# Patient Record
Sex: Male | Born: 1960 | Race: White | Hispanic: No | Marital: Married | Smoking: Former smoker
Health system: Southern US, Community
[De-identification: ages and names within clinical notes are randomized; demographics above are authoritative.]

## PROBLEM LIST (undated history)

## (undated) ENCOUNTER — Ambulatory Visit: Admission: EM | Payer: BC Managed Care – PPO | Source: Home / Self Care

## (undated) DIAGNOSIS — J45909 Unspecified asthma, uncomplicated: Secondary | ICD-10-CM

## (undated) DIAGNOSIS — E785 Hyperlipidemia, unspecified: Secondary | ICD-10-CM

## (undated) DIAGNOSIS — T7840XA Allergy, unspecified, initial encounter: Secondary | ICD-10-CM

## (undated) DIAGNOSIS — K635 Polyp of colon: Secondary | ICD-10-CM

## (undated) DIAGNOSIS — K219 Gastro-esophageal reflux disease without esophagitis: Secondary | ICD-10-CM

## (undated) HISTORY — DX: Unspecified asthma, uncomplicated: J45.909

## (undated) HISTORY — PX: WRIST ARTHROPLASTY: SHX1088

## (undated) HISTORY — DX: Hyperlipidemia, unspecified: E78.5

## (undated) HISTORY — DX: Allergy, unspecified, initial encounter: T78.40XA

## (undated) HISTORY — PX: CLAVICLE EXCISION: SHX597

## (undated) HISTORY — DX: Polyp of colon: K63.5

## (undated) HISTORY — PX: HERNIA REPAIR: SHX51

## (undated) HISTORY — DX: Gastro-esophageal reflux disease without esophagitis: K21.9

---

## 2011-05-21 LAB — HM COLONOSCOPY

## 2014-07-26 ENCOUNTER — Emergency Department: Payer: Self-pay | Admitting: Emergency Medicine

## 2014-07-26 LAB — BASIC METABOLIC PANEL
Anion Gap: 7 (ref 7–16)
BUN: 13 mg/dL (ref 7–18)
CO2: 26 mmol/L (ref 21–32)
Calcium, Total: 9.4 mg/dL (ref 8.5–10.1)
Chloride: 105 mmol/L (ref 98–107)
Creatinine: 1.14 mg/dL (ref 0.60–1.30)
EGFR (African American): >60
EGFR (Non-African Amer.): >60
Glucose: 111 mg/dL — ABNORMAL HIGH (ref 65–99)
Osmolality: 276 (ref 275–301)
Potassium: 4.1 mmol/L (ref 3.5–5.1)
Sodium: 138 mmol/L (ref 136–145)

## 2014-07-26 LAB — CBC
HCT: 46.8 % (ref 40.0–52.0)
HGB: 15.7 g/dL (ref 13.0–18.0)
MCH: 27.8 pg (ref 26.0–34.0)
MCHC: 33.5 g/dL (ref 32.0–36.0)
MCV: 83 fL (ref 80–100)
Platelet: 302 10*3/uL (ref 150–440)
RBC: 5.64 10*6/uL (ref 4.40–5.90)
RDW: 14 % (ref 11.5–14.5)
WBC: 9.5 10*3/uL (ref 3.8–10.6)

## 2014-07-26 LAB — TROPONIN I

## 2016-02-13 ENCOUNTER — Ambulatory Visit (INDEPENDENT_AMBULATORY_CARE_PROVIDER_SITE_OTHER): Payer: BLUE CROSS/BLUE SHIELD | Admitting: Family Medicine

## 2016-02-13 ENCOUNTER — Encounter: Payer: Self-pay | Admitting: Family Medicine

## 2016-02-13 VITALS — BP 144/82 | HR 89 | Temp 98.0°F | Resp 16 | Ht 68.0 in | Wt 222.6 lb

## 2016-02-13 DIAGNOSIS — E785 Hyperlipidemia, unspecified: Secondary | ICD-10-CM | POA: Diagnosis not present

## 2016-02-13 DIAGNOSIS — I10 Essential (primary) hypertension: Secondary | ICD-10-CM | POA: Diagnosis not present

## 2016-02-13 DIAGNOSIS — Z125 Encounter for screening for malignant neoplasm of prostate: Secondary | ICD-10-CM | POA: Diagnosis not present

## 2016-02-13 DIAGNOSIS — J4540 Moderate persistent asthma, uncomplicated: Secondary | ICD-10-CM | POA: Insufficient documentation

## 2016-02-13 DIAGNOSIS — J4530 Mild persistent asthma, uncomplicated: Secondary | ICD-10-CM | POA: Diagnosis not present

## 2016-02-13 NOTE — Assessment & Plan Note (Signed)
Continue statin once daily. Check fasting lipid panel.

## 2016-02-13 NOTE — Assessment & Plan Note (Signed)
Appear controlled. Will need spirometry next visit. Check theophylline levels. Reviewed alarm symptoms.

## 2016-02-13 NOTE — Patient Instructions (Addendum)
[email or web address]  Your goal blood pressure is 140/90. Check 2-3 times per week.  Work on low salt/sodium diet - goal <1.5gm (1,500mg ) per day. Eat a diet high in fruits/vegetables and whole grains.  Look into mediterranean and DASH diet. Goal activity is 168min/wk of moderate intensity exercise.  This can be split into 30 minute chunks.  If you are not at this level, you can start with smaller 10-15 min increments and slowly build up activity. Look at [place].org for more resources   Learning About Sleeping Well What does sleeping well mean? Sleeping well means getting enough sleep. How much sleep is enough varies among people. The number of hours you sleep is not as important as how you feel when you wake up. If you do not feel refreshed, you probably need more sleep. Another sign of not getting enough sleep is feeling tired during the day. The average total nightly sleep time is 7 to 8 hours. Healthy adults may need a little more or a little less than this. Why is getting enough sleep important? Getting enough quality sleep is a basic part of good health. When your sleep suffers, your mood and your thoughts can suffer too. You may find yourself feeling more grumpy or stressed. Not getting enough sleep also can lead to serious problems, including injury, accidents, anxiety, and depression. What might cause poor sleeping? Many things can cause sleep problems, including: 1. Stress. Stress can be caused by fear about a single event, such as giving a speech. Or you may have ongoing stress, such as worry about work or school. 2. Depression, anxiety, and other mental or emotional conditions. 3. Changes in your sleep habits or surroundings. This includes changes that happen where you sleep, such as noise, light, or sleeping in a different bed. It also includes changes in your sleep pattern, such as having jet lag or working a late shift. 4. Health problems, such as pain, breathing  problems, and restless legs syndrome. 5. Lack of regular exercise. How can you help yourself? Here are some tips that may help you sleep more soundly and wake up feeling more refreshed. Your sleeping area  1. Use your bedroom only for sleeping and sex. A bit of light reading may help you fall asleep. But if it doesn't, do your reading elsewhere in the house. Don't watch TV in bed. 2. Be sure your bed is big enough to stretch out comfortably, especially if you have a sleep partner. 3. Keep your bedroom quiet, dark, and cool. Use curtains, blinds, or a sleep mask to block out light. To block out noise, use earplugs, soothing music, or a "white noise" machine. Your evening and bedtime routine  1. Create a relaxing bedtime routine. You might want to take a warm shower or bath, listen to soothing music, or drink a cup of noncaffeinated tea. 2. Go to bed at the same time every night. And get up at the same time every morning, even if you feel tired. What to avoid  1. Limit caffeine (coffee, tea, caffeinated sodas) during the day, and don't have any for at least 4 to 6 hours before bedtime. 2. Don't drink alcohol before bedtime. Alcohol can cause you to wake up more often during the night. 3. Don't smoke or use tobacco, especially in the evening. Nicotine can keep you awake. 4. Don't take naps during the day, especially close to bedtime. 5. Don't lie in bed awake for too long. If you can't fall asleep, or if you  wake up in the middle of the night and can't get back to sleep within 15 minutes or so, get out of bed and go to another room until you feel sleepy. 6. Don't take medicine right before bed that may keep you awake or make you feel hyper or energized. Your doctor can tell you if your medicine may do this and if you can take it earlier in the day. If you can't sleep   Imagine yourself in a peaceful, pleasant scene. Focus on the details and feelings of being in a place that is relaxing.  Get up  and do a quiet or boring activity until you feel sleepy.  Don't drink any liquids after 6 p.m. if you wake up often because you have to go to the bathroom.

## 2016-02-13 NOTE — Assessment & Plan Note (Signed)
Elevated today. Pt will check at home 2-3 x per week x 1 mos. Check CMET. Will discuss medication at next visit.

## 2016-02-13 NOTE — Progress Notes (Signed)
Subjective:    Patient ID: Derek Cook, male    DOB: August 24, 1961, 55 y.o.   MRN: [id number]  HPI: Derek Cook is a 55 y.o. male presenting on 02/13/2016 for Establish Care   HPI  Pt presents to establish care today. [facility]  It has been 4 months since His last PCP visit. Records from previous provider will be requested and reviewed. Current medical problems include:  Hyperlipidemia: Was previously on medication but had muscle cramps. Is on Livalo- new statin- 3 mos. Tolerating well.  Hypertension: Diagnosed. Goes up and down. Never taking medication. No chest pain. No shortness of breath. No dizziness.  Asthma: Take theophyilline- started taking late 80's, Proventil for rescue, advair daily. Dx age: 36.2 years old.  Wife started smoking a E-cigarettes in house- agitations his asthma. Take theophylline in the AM. Uses rescue inhaler PRN- about 5 times per week. Has not spirometry.    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  Drinks water. Watching snacks- mows yard. Walkiing Colonoscopy: Had one age 30. Polyps- benign. 5 year return? PSA: No family of prostate.  Tdap: Within 10 years.   Past Medical History  Diagnosis Date  . Hyperlipidemia   . Asthma   . Allergy   . GERD (gastroesophageal reflux disease)   . Colon polyp    Social History   Social History  . Marital Status: Married    Spouse Name: N/A  . Number of Children: N/A  . Years of Education: N/A   Occupational History  . Not on file.   Social History Main Topics  . Smoking status: Former Smoker    Quit date: 09/07/2009  . Smokeless tobacco: Not on file  . Alcohol Use: No  . Drug Use: No  . Sexual Activity: Not on file   Other Topics Concern  . Not on file   Social History Narrative  . No narrative on file   Family History  Problem Relation Age of Onset  . Cancer Mother     malonema  . Diabetes Mother   . Hyperlipidemia Mother   .  Dementia Mother   . Cancer Father   . Cancer Sister   . Heart disease Sister   . Diabetes Brother   . Hyperlipidemia Brother   . Dementia Paternal Grandmother    No current outpatient prescriptions on file prior to visit.   No current facility-administered medications on file prior to visit.    Review of Systems  Constitutional: Negative for fever and chills.  HENT: Negative.   Respiratory: Negative for chest tightness, shortness of breath and wheezing.   Cardiovascular: Negative for chest pain, palpitations and leg swelling.  Gastrointestinal: Negative for nausea, vomiting and abdominal pain.  Endocrine: Negative.   Genitourinary: Negative for dysuria, urgency, discharge, penile pain and testicular pain.  Musculoskeletal: Negative for back pain, joint swelling and arthralgias.  Skin: Negative.   Neurological: Negative for dizziness, weakness, numbness and headaches.  Psychiatric/Behavioral: Negative for sleep disturbance and dysphoric mood.   Per HPI unless specifically indicated above     Objective:    BP 144/82 mmHg  Pulse 89  Temp(Src) 98 F (36.7 C) (Oral)  Resp 16  Ht _0  (1.727 m)  Wt 222 lb 9.6 oz (100.971 kg)  BMI 33.85 kg/m2  Wt Readings from Last 3 Encounters:  02/13/16 222 lb 9.6 oz (100.971 kg)    Physical Exam  Constitutional: He is oriented to  person, place, and time. He appears well-developed and well-nourished. No distress.  HENT:  Head: Normocephalic and atraumatic.  Neck: Neck supple. No thyromegaly present.  Cardiovascular: Normal rate, regular rhythm and normal heart sounds.  Exam reveals no gallop and no friction rub.   No murmur heard. Pulmonary/Chest: Effort normal and breath sounds normal. He has no wheezes.  Abdominal: Soft. Bowel sounds are normal. He exhibits no distension. There is no tenderness. There is no rebound.  Musculoskeletal: Normal range of motion. He exhibits no edema or tenderness.  Neurological: He is alert and oriented  to person, place, and time. He has normal reflexes.  Skin: Skin is warm and dry. No rash noted. No erythema.  Psychiatric: He has a normal mood and affect. His behavior is normal. Thought content normal.   Results for orders placed or performed in visit on 07/26/14  Troponin I  Result Value Ref Range   Troponin-I < 0.02 ng/mL  CBC  Result Value Ref Range   WBC 9.5 3.8-10.6 x10 3/mm 3   RBC 5.64 4.40-5.90 x10 6/mm 3   HGB 15.7 13.0-18.0 g/dL   HCT 46.8 40.0-52.0 %   MCV 83 80-100 fL   MCH 27.8 26.0-34.0 pg   MCHC 33.5 32.0-36.0 g/dL   RDW 14.0 11.5-14.5 %   Platelet 302 150-440 x10 3/mm 3  Basic metabolic panel  Result Value Ref Range   Glucose 111 (H) 65-99 mg/dL   BUN 13 7-18 mg/dL   Creatinine 1.14 0.60-1.30 mg/dL   Sodium 138 136-145 mmol/L   Potassium 4.1 3.5-5.1 mmol/L   Chloride 105 98-107 mmol/L   Co2 26 21-32 mmol/L   Calcium, Total 9.4 8.5-10.1 mg/dL   Osmolality 276 275-301   Anion Gap 7 7-16   EGFR (African American) >60 >53m/min   EGFR (Non-African Amer.) >60 >625mmin      Assessment & Plan:   Problem List Items Addressed This Visit      Cardiovascular and Mediastinum   Hypertension    Elevated today. Pt will check at home 2-3 x per week x 1 mos. Check CMET. Will discuss medication at next visit.       Relevant Medications   aspirin EC 81 MG tablet   LIVALO 2 MG TABS     Respiratory   Mild persistent asthma - Primary    Appear controlled. Will need spirometry next visit. Check theophylline levels. Reviewed alarm symptoms.       Relevant Medications   theophylline (THEODUR) 300 MG 12 hr tablet   PROVENTIL HFA 108 (90 Base) MCG/ACT inhaler   ADVAIR DISKUS 250-50 MCG/DOSE AEPB   Other Relevant Orders   COMPLETE METABOLIC PANEL WITH GFR   Theophylline level     Other   Hyperlipidemia    Continue statin once daily. Check fasting lipid panel.       Relevant Medications   aspirin EC 81 MG tablet   LIVALO 2 MG TABS   Other Relevant Orders    Lipid Profile    Other Visit Diagnoses    Screening for prostate cancer        Pt would like screening PSA.     Relevant Orders    PSA       Meds ordered this encounter  Medications  . aspirin EC 81 MG tablet    Sig: Take 81 mg by mouth.  . theophylline (THEODUR) 300 MG 12 hr tablet    Sig:     Refill:  2  . omeprazole (PRILOSEC)  40 MG capsule    Sig:     Refill:  3  . LIVALO 2 MG TABS    Sig:     Refill:  10  . PROVENTIL HFA 108 (90 Base) MCG/ACT inhaler    Sig:     Refill:  1  . ADVAIR DISKUS 250-50 MCG/DOSE AEPB    Sig:     Refill:  8  . Melatonin 3 MG TABS    Sig: Take 10 mg by mouth.  . loratadine-pseudoephedrine (CLARITIN-D 24-HOUR) 10-240 MG 24 hr tablet    Sig: Take 1 tablet by mouth daily.      Follow up plan: Return in about 4 weeks (around 03/12/2016) for BP check. Needs appt for lab.Marland Kitchen

## 2016-02-25 ENCOUNTER — Ambulatory Visit (INDEPENDENT_AMBULATORY_CARE_PROVIDER_SITE_OTHER): Payer: BLUE CROSS/BLUE SHIELD | Admitting: Family Medicine

## 2016-02-25 VITALS — BP 143/87 | HR 74 | Temp 98.5°F | Resp 16 | Ht 68.0 in | Wt 222.6 lb

## 2016-02-25 DIAGNOSIS — I10 Essential (primary) hypertension: Secondary | ICD-10-CM | POA: Diagnosis not present

## 2016-02-25 DIAGNOSIS — N529 Male erectile dysfunction, unspecified: Secondary | ICD-10-CM

## 2016-02-25 MED ORDER — AMLODIPINE BESYLATE 5 MG PO TABS: 5.0000 mg | ORAL_TABLET | Freq: Every day | ORAL | Status: DC

## 2016-02-25 NOTE — Assessment & Plan Note (Signed)
Start amlodipine once daily to help control blood pressure. Reviewed DASH diet. Check CMET- pt has appt for lab work.  Recheck 4 weeks.

## 2016-02-25 NOTE — Progress Notes (Signed)
Subjective:    Patient ID: Derek Cook, male    DOB: [birth year], 55 y.o.   MRN: [id number]  HPI: Derek Cook is a 55 y.o. male presenting on 02/25/2016 for Hypertension   HPI  Pt presents for blood pressure recheck. BP's at home 150/80-90's. High 166/124- had a HA at the time. No chest pain or shortness of breath.  Has stopped taking his claritin-D. It caused his blood pressure to elevate.  Has concerns about erectile dysfunction. Noticed decrease in ability to maintain erection over past few years. Desire is the same. Recent stressors due to wife's health and weight gain he thinks are contributing. Was given viagra samples by his previous doctor but never tried them.   Past Medical History  Diagnosis Date  . Hyperlipidemia   . Asthma   . Allergy   . GERD (gastroesophageal reflux disease)   . Colon polyp     Current Outpatient Prescriptions on File Prior to Visit  Medication Sig  . ADVAIR DISKUS 250-50 MCG/DOSE AEPB   . aspirin EC 81 MG tablet Take 81 mg by mouth.  Marland Kitchen LIVALO 2 MG TABS   . loratadine-pseudoephedrine (CLARITIN-D 24-HOUR) 10-240 MG 24 hr tablet Take 1 tablet by mouth daily.  . Melatonin 3 MG TABS Take 10 mg by mouth.  Marland Kitchen omeprazole (PRILOSEC) 40 MG capsule   . PROVENTIL HFA 108 (90 Base) MCG/ACT inhaler   . theophylline (THEODUR) 300 MG 12 hr tablet    No current facility-administered medications on file prior to visit.    Review of Systems  Constitutional: Negative for fever and chills.  HENT: Negative.   Respiratory: Negative for chest tightness, shortness of breath and wheezing.   Cardiovascular: Negative for chest pain, palpitations and leg swelling.  Gastrointestinal: Negative for nausea, vomiting and abdominal pain.  Endocrine: Negative.   Genitourinary: Negative for dysuria, urgency, discharge, penile pain and testicular pain.       Erectile dysfunction.  Musculoskeletal: Negative for back pain, joint swelling and arthralgias.  Skin: Negative.     Neurological: Negative for dizziness, weakness, numbness and headaches.  Psychiatric/Behavioral: Negative for sleep disturbance and dysphoric mood.   Per HPI unless specifically indicated above     Objective:    BP 143/87 mmHg  Pulse 74  Temp(Src) 98.5 F (36.9 C) (Oral)  Resp 16  Ht 5' 8"  (1.727 m)  Wt 222 lb 9.6 oz (100.971 kg)  BMI 33.85 kg/m2  Wt Readings from Last 3 Encounters:  02/25/16 222 lb 9.6 oz (100.971 kg)  02/13/16 222 lb 9.6 oz (100.971 kg)    Physical Exam  Constitutional: He is oriented to person, place, and time. He appears well-developed and well-nourished. No distress.  HENT:  Head: Normocephalic and atraumatic.  Neck: Neck supple. No thyromegaly present.  Cardiovascular: Normal rate, regular rhythm and normal heart sounds.  Exam reveals no gallop and no friction rub.   No murmur heard. Pulmonary/Chest: Effort normal and breath sounds normal. He has no wheezes.  Abdominal: Soft. Bowel sounds are normal. He exhibits no distension. There is no tenderness. There is no rebound.  Musculoskeletal: Normal range of motion. He exhibits no edema or tenderness.  Neurological: He is alert and oriented to person, place, and time. He has normal reflexes.  Skin: Skin is warm and dry. No rash noted. No erythema.  Psychiatric: He has a normal mood and affect. His behavior is normal. Thought content normal.   Results for orders placed or performed in visit on  07/26/14  Troponin I  Result Value Ref Range   Troponin-I < 0.02 ng/mL  CBC  Result Value Ref Range   WBC 9.5 3.8-10.6 x10 3/mm 3   RBC 5.64 4.40-5.90 x10 6/mm 3   HGB 15.7 13.0-18.0 g/dL   HCT 46.8 40.0-52.0 %   MCV 83 80-100 fL   MCH 27.8 26.0-34.0 pg   MCHC 33.5 32.0-36.0 g/dL   RDW 14.0 11.5-14.5 %   Platelet 302 150-440 x10 3/mm 3  Basic metabolic panel  Result Value Ref Range   Glucose 111 (H) 65-99 mg/dL   BUN 13 7-18 mg/dL   Creatinine 1.14 0.60-1.30 mg/dL   Sodium 138 136-145 mmol/L   Potassium  4.1 3.5-5.1 mmol/L   Chloride 105 98-107 mmol/L   Co2 26 21-32 mmol/L   Calcium, Total 9.4 8.5-10.1 mg/dL   Osmolality 276 275-301   Anion Gap 7 7-16   EGFR (African American) >60 >66m/min   EGFR (Non-African Amer.) >60 >657mmin      Assessment & Plan:   Problem List Items Addressed This Visit      Cardiovascular and Mediastinum   Hypertension - Primary    Start amlodipine once daily to help control blood pressure. Reviewed DASH diet. Check CMET- pt has appt for lab work.  Recheck 4 weeks.       Relevant Medications   amLODipine (NORVASC) 5 MG tablet    Other Visit Diagnoses    Erectile dysfunction, unspecified erectile dysfunction type        Likely 2/2 weight gain or recent stressors. Discussed non-pharmacological managment. Consider medication if symptoms do not improve.        Meds ordered this encounter  Medications  . amLODipine (NORVASC) 5 MG tablet    Sig: Take 1 tablet (5 mg total) by mouth daily.    Dispense:  30 tablet    Refill:  11    Order Specific Question:  Supervising Provider    Answer:  HAArlis [phone]    Follow up plan: Return in about 1 month (around 03/26/2016) for BP check. .Marland Kitchen

## 2016-02-25 NOTE — Patient Instructions (Addendum)
Your goal blood pressure is 140/90 Work on low salt/sodium diet - goal <1.5gm (1,500mg ) per day. Eat a diet high in fruits/vegetables and whole grains.  Look into mediterranean and DASH diet. Goal activity is 178min/wk of moderate intensity exercise.  This can be split into 30 minute chunks.  If you are not at this level, you can start with smaller 10-15 min increments and slowly build up activity. Look at [place].org for more resources   Erectile Dysfunction Erectile dysfunction is the inability to get or sustain a good enough erection to have sexual intercourse. Erectile dysfunction may involve:  Inability to get an erection.  Lack of enough hardness to allow penetration.  Loss of the erection before sex is finished.  Premature ejaculation. CAUSES  Certain drugs, such as:  Pain relievers.  Antihistamines.  Antidepressants.  Blood pressure medicines.  Water pills (diuretics).  Ulcer medicines.  Muscle relaxants.  Illegal drugs.  Excessive drinking.  Psychological causes, such as:  Anxiety.  Depression.  Sadness.  Exhaustion.  Performance fear.  Stress.  Physical causes, such as:  Artery problems. This may include diabetes, smoking, liver disease, or atherosclerosis.  High blood pressure.  Hormonal problems, such as low testosterone.  Obesity.  Nerve problems. This may include back or pelvic injuries, diabetes mellitus, multiple sclerosis, or Parkinson disease. SYMPTOMS  Inability to get an erection.  Lack of enough hardness to allow penetration.  Loss of the erection before sex is finished.  Premature ejaculation.  Normal erections at some times, but with frequent unsatisfactory episodes.  Orgasms that are not satisfactory in sensation or frequency.  Low sexual satisfaction in either partner because of erection problems.  A curved penis occurring with erection. The curve may cause pain or may be too curved to allow for  intercourse.  Never having nighttime erections. DIAGNOSIS Your caregiver can often diagnose this condition by:  Performing a physical exam to find other diseases or specific problems with the penis.  Asking you detailed questions about the problem.  Performing blood tests to check for diabetes mellitus or to measure hormone levels.  Performing urine tests to find other underlying health conditions.  Performing an ultrasound exam to check for scarring.  Performing a test to check blood flow to the penis.  Doing a sleep study at home to measure nighttime erections. TREATMENT   You may be prescribed medicines by mouth.  You may be given medicine injections into the penis.  You may be prescribed a vacuum pump with a ring.  Penile implant surgery may be performed. You may receive:  An inflatable implant.  A semirigid implant.  Blood vessel surgery may be performed. HOME CARE INSTRUCTIONS  If you are prescribed oral medicine, you should take the medicine as prescribed. Do not increase the dosage without first discussing it with your physician.  If you are using self-injections, be careful to avoid any veins that are on the surface of the penis. Apply pressure to the injection site for 5 minutes.  If you are using a vacuum pump, make sure you have read the instructions before using it. Discuss any questions with your physician before taking the pump home. SEEK MEDICAL CARE IF:  You experience pain that is not responsive to the pain medicine you have been prescribed.  You experience nausea or vomiting. SEEK IMMEDIATE MEDICAL CARE IF:   When taking oral or injectable medications, you experience an erection that lasts longer than 4 hours. If your physician is unavailable, go to the nearest  emergency room for evaluation. An erection that lasts much longer than 4 hours can result in permanent damage to your penis.  You have pain that is severe.  You develop redness, severe  pain, or severe swelling of your penis.  You have redness spreading up into your groin or lower abdomen.  You are unable to pass your urine.   This information is not intended to replace advice given to you by your health care provider. Make sure you discuss any questions you have with your health care provider.   Document Released: 08/21/2000 Document Revised: 04/26/2013 Document Reviewed: 01/26/2013 Elsevier Interactive Patient Education [employer].

## 2016-03-09 ENCOUNTER — Other Ambulatory Visit: Payer: Self-pay | Admitting: Family Medicine

## 2016-03-09 MED ORDER — LISINOPRIL 20 MG PO TABS: 20.0000 mg | ORAL_TABLET | Freq: Every day | ORAL | Status: DC

## 2016-03-09 NOTE — Telephone Encounter (Signed)
We can add lisinopril to the 5mg  amlodipine. Ankle swelling is an expected side effect of amlodipine. Usually it does not cause any major problems and is very minor- however if it is bothersome, he can stop it. 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  I would recommend starting 20mg  lisinopril once daily if he wants to stop amlodipine. Thanks! AK

## 2016-03-09 NOTE — Telephone Encounter (Signed)
Patient would like to stop Amlodipine. Please sent Lisinopril to [facility].

## 2016-03-09 NOTE — Telephone Encounter (Signed)
Patient refuses to double up. He thinks that medication is causing his ankles to swell. He did not take on yesterday and this morning some of swelling had went down. Please advise.

## 2016-03-09 NOTE — Telephone Encounter (Signed)
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 Wanted to know what to do. Pt call back # is  [phone]

## 2016-03-09 NOTE — Telephone Encounter (Signed)
Please tell Derek Cook to take 2 pills of his amlodipine for a total of 10mg  daily and monitor BP closely at home. I will see him back on 7/10 for BP follow-up at that time.

## 2016-03-12 ENCOUNTER — Other Ambulatory Visit: Payer: BLUE CROSS/BLUE SHIELD

## 2016-03-16 ENCOUNTER — Ambulatory Visit: Payer: BLUE CROSS/BLUE SHIELD | Admitting: Family Medicine

## 2016-03-18 ENCOUNTER — Other Ambulatory Visit: Payer: BLUE CROSS/BLUE SHIELD

## 2016-03-18 ENCOUNTER — Other Ambulatory Visit: Payer: Self-pay | Admitting: Family Medicine

## 2016-03-19 LAB — COMPLETE METABOLIC PANEL WITHOUT GFR
ALT: 25 U/L (ref 9–46)
AST: 17 U/L (ref 10–35)
Albumin: 4 g/dL (ref 3.6–5.1)
Alkaline Phosphatase: 90 U/L (ref 40–115)
BUN: 15 mg/dL (ref 7–25)
CO2: 26 mmol/L (ref 20–31)
Calcium: 9.6 mg/dL (ref 8.6–10.3)
Chloride: 105 mmol/L (ref 98–110)
Creat: 1.08 mg/dL (ref 0.70–1.33)
GFR, Est African American: >89 mL/min
GFR, Est Non African American: 78 mL/min
Glucose, Bld: 103 mg/dL — ABNORMAL HIGH (ref 65–99)
Potassium: 4.4 mmol/L (ref 3.5–5.3)
Sodium: 136 mmol/L (ref 135–146)
Total Bilirubin: 0.4 mg/dL (ref 0.2–1.2)
Total Protein: 6.5 g/dL (ref 6.1–8.1)

## 2016-03-19 LAB — LIPID PANEL
Cholesterol: 206 mg/dL — ABNORMAL HIGH (ref 125–200)
HDL: 42 mg/dL
LDL Cholesterol: 96 mg/dL
Total CHOL/HDL Ratio: 4.9 ratio
Triglycerides: 341 mg/dL — ABNORMAL HIGH
VLDL: 68 mg/dL — ABNORMAL HIGH

## 2016-03-19 LAB — PSA: PSA: 1.96 ng/mL

## 2016-03-19 LAB — THEOPHYLLINE LEVEL: Theophylline Lvl: <2.5 mg/L — ABNORMAL LOW (ref 10.0–20.0)

## 2016-03-24 ENCOUNTER — Ambulatory Visit (INDEPENDENT_AMBULATORY_CARE_PROVIDER_SITE_OTHER): Payer: BLUE CROSS/BLUE SHIELD | Admitting: Family Medicine

## 2016-03-24 VITALS — BP 126/80 | HR 76 | Temp 98.7°F | Resp 16 | Ht 68.0 in | Wt 221.0 lb

## 2016-03-24 DIAGNOSIS — R7301 Impaired fasting glucose: Secondary | ICD-10-CM | POA: Diagnosis not present

## 2016-03-24 DIAGNOSIS — R7303 Prediabetes: Secondary | ICD-10-CM

## 2016-03-24 DIAGNOSIS — I10 Essential (primary) hypertension: Secondary | ICD-10-CM | POA: Diagnosis not present

## 2016-03-24 LAB — POCT GLYCOSYLATED HEMOGLOBIN (HGB A1C): HEMOGLOBIN A1C: 5.9

## 2016-03-24 NOTE — Assessment & Plan Note (Signed)
A1c of 5.9%- eliminate sugar sweetened beverages and increase exercise.Encouraged prediabetes class at [facility]. Recheck 3 mos.

## 2016-03-24 NOTE — Progress Notes (Signed)
Subjective:    Patient ID: Derek Cook, male    DOB: 26-Jan-1961, 55 y.o.   MRN: [id number]  HPI: Derek Cook is a 55 y.o. male presenting on 03/24/2016 for Hypertension   HPI  Pt presents for BP follow-up. Controlled BP. Avg 120-130/70-80. Did not tolerated amlodipine due to ankle swelling. Tolerating lisinopril 20mg  well.  Fasting labs showed a high glucose. Will run an A1c today. A1c is 5.9% Drinks 1 7oz pepsi every other day. Drinks lipton green tea.  Theophylline level was low. Pt is only taking once per day.   Past Medical History  Diagnosis Date  . Hyperlipidemia   . Asthma   . Allergy   . GERD (gastroesophageal reflux disease)   . Colon polyp     Current Outpatient Prescriptions on File Prior to Visit  Medication Sig  . ADVAIR DISKUS 250-50 MCG/DOSE AEPB   . amLODipine (NORVASC) 5 MG tablet Take 1 tablet (5 mg total) by mouth daily.  Marland Kitchen aspirin EC 81 MG tablet Take 81 mg by mouth.  Marland Kitchen lisinopril (PRINIVIL,ZESTRIL) 20 MG tablet Take 1 tablet (20 mg total) by mouth daily.  Marland Kitchen LIVALO 2 MG TABS   . loratadine-pseudoephedrine (CLARITIN-D 24-HOUR) 10-240 MG 24 hr tablet Take 1 tablet by mouth daily.  . Melatonin 3 MG TABS Take 10 mg by mouth.  Marland Kitchen omeprazole (PRILOSEC) 40 MG capsule   . PROVENTIL HFA 108 (90 Base) MCG/ACT inhaler   . theophylline (THEODUR) 300 MG 12 hr tablet    No current facility-administered medications on file prior to visit.    Review of Systems  Constitutional: Negative for fever and chills.  HENT: Negative.   Respiratory: Negative for chest tightness, shortness of breath and wheezing.   Cardiovascular: Negative for chest pain, palpitations and leg swelling.  Gastrointestinal: Negative for nausea, vomiting and abdominal pain.  Endocrine: Negative.   Genitourinary: Negative for dysuria, urgency, discharge, penile pain and testicular pain.  Musculoskeletal: Negative for back pain, joint swelling and arthralgias.  Skin: Negative.   Neurological:  Negative for dizziness, weakness, numbness and headaches.  Psychiatric/Behavioral: Negative for sleep disturbance and dysphoric mood.   Per HPI unless specifically indicated above     Objective:    BP 126/80 mmHg  Pulse 76  Temp(Src) 98.7 F (37.1 C) (Oral)  Resp 16  Ht 5\' 8"  (1.727 m)  Wt 221 lb (100.245 kg)  BMI 33.61 kg/m2  Wt Readings from Last 3 Encounters:  03/24/16 221 lb (100.245 kg)  02/25/16 222 lb 9.6 oz (100.971 kg)  02/13/16 222 lb 9.6 oz (100.971 kg)    Physical Exam  Constitutional: He is oriented to person, place, and time. He appears well-developed and well-nourished. No distress.  HENT:  Head: Normocephalic and atraumatic.  Neck: Neck supple. No thyromegaly present.  Cardiovascular: Normal rate, regular rhythm and normal heart sounds.  Exam reveals no gallop and no friction rub.   No murmur heard. Pulmonary/Chest: Effort normal and breath sounds normal. He has no wheezes.  Abdominal: Soft. Bowel sounds are normal. He exhibits no distension. There is no tenderness. There is no rebound.  Musculoskeletal: Normal range of motion. He exhibits no edema or tenderness.  Neurological: He is alert and oriented to person, place, and time. He has normal reflexes.  Skin: Skin is warm and dry. No rash noted. No erythema.  Psychiatric: He has a normal mood and affect. His behavior is normal. Thought content normal.   Results for orders placed or performed in  visit on 03/24/16  POCT HgB A1C  Result Value Ref Range   Hemoglobin A1C 5.9       Assessment & Plan:   Problem List Items Addressed This Visit      Cardiovascular and Mediastinum   Hypertension    Controlled with lisinopril once daily.         Other   Prediabetes    A1c of 5.9%- eliminate sugar sweetened beverages and increase exercise.Encouraged prediabetes class at [facility]. Recheck 3 mos.        Other Visit Diagnoses    Elevated fasting glucose    -  Primary    Relevant Orders    POCT HgB A1C  (Completed)       No orders of the defined types were placed in this encounter.      Follow up plan: Return in about 3 months (around 06/24/2016) for A1c check. Marland Kitchen

## 2016-03-24 NOTE — Patient Instructions (Addendum)
Please let me know if the muscle cramping continues. It could be a combination of the lisinopril and Livalo.  We will keep an eye on it.   Your goal blood pressure is 140/90 Work on low salt/sodium diet - goal <1.5gm (1,500mg ) per day. Eat a diet high in fruits/vegetables and whole grains.  Look into mediterranean and DASH diet. Goal activity is 163min/wk of moderate intensity exercise.  This can be split into 30 minute chunks.  If you are not at this level, you can start with smaller 10-15 min increments and slowly build up activity. Look at [place].org for more resources  Her HgA1c was 5.9%. This is consistent with prediabetes. Our goal is to prevent diabetes with treatment of this through diet and lifestyle changes. The [place] has a great class on Prediabetes What you need to know.  You can get more information on the [employer].  Eliminate all sugar sweetened beverages from diet. Decrease portion sizes of breads, rice, pasta. Increase exercise to 30-40 minutes 3-4 times per week. The CDC and American Diabetes Association have great resources on their websites.  We will recheck your A1c in 3 mos.

## 2016-03-24 NOTE — Assessment & Plan Note (Signed)
Controlled with lisinopril once daily.

## 2016-03-31 ENCOUNTER — Telehealth: Payer: Self-pay | Admitting: Family Medicine

## 2016-03-31 NOTE — Telephone Encounter (Signed)
Pt said the lisinopril is making him tired and has muscle fatigue.  It does help with the swelling in feet and ankles.  He asked about trying a lower dosage.  Please call [phone]

## 2016-03-31 NOTE — Telephone Encounter (Signed)
Patient only has 20 mg of Lisinopril. If he takes 1.5 then it will be 20 mg.

## 2016-03-31 NOTE — Telephone Encounter (Signed)
Pt will cut the 20mg  tablet in 1/2 because that is what he has at home.

## 2016-03-31 NOTE — Telephone Encounter (Signed)
Tell him to take 1.5 tablets of the 10mg  pills to determine if that helps with symptoms and swelling while also controlling BP. Thanks! AK

## 2016-03-31 NOTE — Telephone Encounter (Signed)
LMTCB

## 2016-04-16 ENCOUNTER — Ambulatory Visit (INDEPENDENT_AMBULATORY_CARE_PROVIDER_SITE_OTHER): Payer: BLUE CROSS/BLUE SHIELD | Admitting: Family Medicine

## 2016-04-16 VITALS — BP 116/66 | HR 82 | Temp 97.9°F | Resp 16 | Ht 68.0 in | Wt 222.6 lb

## 2016-04-16 DIAGNOSIS — R600 Localized edema: Secondary | ICD-10-CM

## 2016-04-16 DIAGNOSIS — I10 Essential (primary) hypertension: Secondary | ICD-10-CM | POA: Diagnosis not present

## 2016-04-16 DIAGNOSIS — L84 Corns and callosities: Secondary | ICD-10-CM

## 2016-04-16 MED ORDER — MEDICAL COMPRESSION SOCKS MISC: 1.0000 | Freq: Every day | 0 refills | Status: DC

## 2016-04-16 MED ORDER — CORN CUSHIONS PADS: 1.0000 | MEDICATED_PAD | Freq: Every day | 0 refills | Status: DC

## 2016-04-16 NOTE — Progress Notes (Signed)
Subjective:    Patient ID: Derek Cook, male    DOB: 1961-01-04, 55 y.o.   MRN: [id number]  HPI: Derek Cook is a 55 y.o. male presenting on 04/16/2016 for Diabetes   HPI  Pt presents for follow-up of hypertension. He thought the lisinopril was making him tired. He also felt it was giving him anxiety. Stopped taking last Sunday and symptoms much improved. Has been checking BP at home. Avg 120/70. No HA, No CP, SOB.  Also noticing fluid retention in the feet. 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   Corn on L foot. Rubbed against shoe last Saturday. Painful and irritated.    Past Medical History:  Diagnosis Date  . Allergy   . Asthma   . Colon polyp   . GERD (gastroesophageal reflux disease)   . Hyperlipidemia     Current Outpatient Prescriptions on File Prior to Visit  Medication Sig  . ADVAIR DISKUS 250-50 MCG/DOSE AEPB   . aspirin EC 81 MG tablet Take 81 mg by mouth.  Marland Kitchen LIVALO 2 MG TABS   . loratadine-pseudoephedrine (CLARITIN-D 24-HOUR) 10-240 MG 24 hr tablet Take 1 tablet by mouth daily.  . Melatonin 3 MG TABS Take 10 mg by mouth.  Marland Kitchen omeprazole (PRILOSEC) 40 MG capsule   . PROVENTIL HFA 108 (90 Base) MCG/ACT inhaler   . theophylline (THEODUR) 300 MG 12 hr tablet    No current facility-administered medications on file prior to visit.     Review of Systems  Constitutional: Positive for fatigue. Negative for chills and fever.  HENT: Negative.   Respiratory: Negative for chest tightness, shortness of breath and wheezing.   Cardiovascular: Negative for chest pain, palpitations and leg swelling.  Gastrointestinal: Negative for abdominal pain, nausea and vomiting.  Endocrine: Negative.   Genitourinary: Negative for discharge, dysuria, penile pain, testicular pain and urgency.  Musculoskeletal: Negative for arthralgias, back pain and joint swelling.  Skin: Positive for rash.  Neurological: Negative for dizziness, weakness, numbness and headaches.    Psychiatric/Behavioral: Negative for dysphoric mood and sleep disturbance.   Per HPI unless specifically indicated above     Objective:    BP 116/66 (BP Location: Right Arm, Patient Position: Sitting, Cuff Size: Large)   Pulse 82   Temp 97.9 F (36.6 C) (Oral)   Resp 16   Ht 5\' 8"  (1.727 m)   Wt 222 lb 9.6 oz (101 kg)   BMI 33.85 kg/m   Wt Readings from Last 3 Encounters:  04/16/16 222 lb 9.6 oz (101 kg)  03/24/16 221 lb (100.2 kg)  02/25/16 222 lb 9.6 oz (101 kg)    Physical Exam  Constitutional: He is oriented to person, place, and time. He appears well-developed and well-nourished. No distress.  HENT:  Head: Normocephalic and atraumatic.  Neck: Neck supple. No thyromegaly present.  Cardiovascular: Normal rate, regular rhythm and normal heart sounds.  Exam reveals no gallop and no friction rub.   No murmur heard. Pulmonary/Chest: Effort normal and breath sounds normal. He has no wheezes.  Abdominal: Soft. Bowel sounds are normal. He exhibits no distension. There is no tenderness. There is no rebound.  Musculoskeletal: Normal range of motion. He exhibits no tenderness.       Right lower leg: He exhibits edema (trace).       Left lower leg: He exhibits edema (trace).  Neurological: He is alert and oriented to person, place, and time. He has normal reflexes.  Skin: Skin is warm  and dry. No rash noted. No erythema.  Calloused area on the lateral side of L foot just below 5th toe.   Psychiatric: He has a normal mood and affect. His behavior is normal. Thought content normal.   Results for orders placed or performed in visit on 03/24/16  POCT HgB A1C  Result Value Ref Range   Hemoglobin A1C 5.9       Assessment & Plan:   Problem List Items Addressed This Visit      Cardiovascular and Mediastinum   Hypertension - Primary    Controlled without medication at this time. Will plan to monitor closely. Pt to call if consistently elevated.        Other Visit Diagnoses     Corn of foot       Recommend corn cushions and good foot care or callus removal. Consider podiatry if not improving.    Relevant Medications   Foot Care Products (CORN CUSHIONS) PADS   Pedal edema       Likely dependent Edema- Encouraged compression stockings and adequate hydration. Consider mild diuretic if not improving.    Relevant Medications   Elastic Bandages & Supports (MEDICAL COMPRESSION SOCKS) MISC      Meds ordered this encounter  Medications  . DISCONTD: lisinopril (PRINIVIL,ZESTRIL) 20 MG tablet    Refill:  2  . Foot Care Products (CORN CUSHIONS) PADS    Sig: 1 each by Does not apply route daily.    Refill:  0    Order Specific Question:   Supervising Provider    Answer:   Arlis Porta [id number]  . Elastic Bandages & Supports (MEDICAL COMPRESSION SOCKS) MISC    Sig: 1 each by Does not apply route daily.    Dispense:  2 each    Refill:  0    Order Specific Question:   Supervising Provider    Answer:   Arlis Porta [id number]      Follow up plan: Return in about 2 months (around 06/25/2016) for Prediabetes. Marland Kitchen

## 2016-04-16 NOTE — Patient Instructions (Addendum)
Let's see how your blood pressure does without lisinopril.  Check blood pressure at home. Your goal blood pressure is 140/90 Work on low salt/sodium diet - goal <1.5gm (1,500mg ) per day. Eat a diet high in fruits/vegetables and whole grains.  Look into mediterranean and DASH diet. Goal activity is 160min/wk of moderate intensity exercise.  This can be split into 30 minute chunks.  If you are not at this level, you can start with smaller 10-15 min increments and slowly build up activity. Look at [place].org for more resources   Wear compression stockings when you travel. Walk around 5 minutes for every 2 hours in the car. Drink plenty of fluids.  Please seek medication attention if you noticed redness, swelling, calf pain in one of your legs.

## 2016-04-16 NOTE — Assessment & Plan Note (Signed)
Controlled without medication at this time. Will plan to monitor closely. Pt to call if consistently elevated.

## 2016-05-22 ENCOUNTER — Telehealth: Payer: Self-pay | Admitting: Family Medicine

## 2016-05-22 MED ORDER — OMEPRAZOLE 40 MG PO CPDR: 40.0000 mg | DELAYED_RELEASE_CAPSULE | Freq: Every day | ORAL | 11 refills | Status: DC

## 2016-05-22 NOTE — Telephone Encounter (Signed)
Done

## 2016-05-22 NOTE — Telephone Encounter (Signed)
Pt needs a prescription for omeprazole 40 mg sent to [facility].  His call back [phone]

## 2016-05-29 ENCOUNTER — Encounter: Payer: Self-pay | Admitting: *Deleted

## 2016-05-29 DIAGNOSIS — K219 Gastro-esophageal reflux disease without esophagitis: Secondary | ICD-10-CM | POA: Insufficient documentation

## 2016-06-08 ENCOUNTER — Telehealth: Payer: Self-pay | Admitting: *Deleted

## 2016-06-08 NOTE — Telephone Encounter (Signed)
Received notice from [employer] stating Omeprozole does not require authorization at this time and may be covered at the pharmacy.

## 2016-06-25 ENCOUNTER — Encounter: Payer: Self-pay | Admitting: Family Medicine

## 2016-06-25 ENCOUNTER — Ambulatory Visit (INDEPENDENT_AMBULATORY_CARE_PROVIDER_SITE_OTHER): Payer: BLUE CROSS/BLUE SHIELD | Admitting: Family Medicine

## 2016-06-25 VITALS — BP 139/78 | HR 85 | Temp 97.8°F | Resp 16 | Ht 68.0 in | Wt 233.0 lb

## 2016-06-25 DIAGNOSIS — I10 Essential (primary) hypertension: Secondary | ICD-10-CM | POA: Diagnosis not present

## 2016-06-25 DIAGNOSIS — G5602 Carpal tunnel syndrome, left upper limb: Secondary | ICD-10-CM | POA: Diagnosis not present

## 2016-06-25 DIAGNOSIS — M7712 Lateral epicondylitis, left elbow: Secondary | ICD-10-CM | POA: Diagnosis not present

## 2016-06-25 DIAGNOSIS — R7303 Prediabetes: Secondary | ICD-10-CM

## 2016-06-25 DIAGNOSIS — E782 Mixed hyperlipidemia: Secondary | ICD-10-CM

## 2016-06-25 LAB — BASIC METABOLIC PANEL WITH GFR
BUN: 16 mg/dL (ref 7–25)
CALCIUM: 9.3 mg/dL (ref 8.6–10.3)
CO2: 23 mmol/L (ref 20–31)
Chloride: 103 mmol/L (ref 98–110)
Creat: 0.96 mg/dL (ref 0.70–1.33)
GFR, Est African American: >89 mL/min (ref >=60)
GLUCOSE: 84 mg/dL (ref 65–99)
Potassium: 4.4 mmol/L (ref 3.5–5.3)
Sodium: 138 mmol/L (ref 135–146)

## 2016-06-25 LAB — LIPID PANEL
CHOL/HDL RATIO: 6.3 ratio — AB (ref <=5.0)
CHOLESTEROL: 245 mg/dL — AB (ref 125–200)
HDL: 39 mg/dL — AB (ref >=40)
TRIGLYCERIDES: 773 mg/dL — AB (ref <150)

## 2016-06-25 LAB — POCT GLYCOSYLATED HEMOGLOBIN (HGB A1C): HEMOGLOBIN A1C: 5.7

## 2016-06-25 MED ORDER — NAPROXEN 500 MG PO TABS: 500.0000 mg | ORAL_TABLET | Freq: Two times a day (BID) | ORAL | 0 refills | Status: DC

## 2016-06-25 NOTE — Assessment & Plan Note (Signed)
Controlled. Continue current regimen. Reviewed DASH diet. Reviewed Alarm symptoms. Return in 3 mos.

## 2016-06-25 NOTE — Patient Instructions (Signed)
Your goal blood pressure is 140/90 Work on low salt/sodium diet - goal <1.5gm (1,500mg ) per day. Eat a diet high in fruits/vegetables and whole grains.  Look into mediterranean and DASH diet. Goal activity is 174min/wk of moderate intensity exercise.  This can be split into 30 minute chunks.  If you are not at this level, you can start with smaller 10-15 min increments and slowly build up activity. Look at [place].org for more resources   Please seek immediate medical attention at ER or Urgent Care if you develop: Chest pain, pressure or tightness. Shortness of breath accompanied by nausea or diaphoresis Visual changes Numbness or tingling on one side of the body Facial droop Altered mental status Or any concerning symptoms.   Please continue to use brace for carpal tunnel. Take naproxen for elbow pain. Can consider getting a brace to help with elbow pain.

## 2016-06-25 NOTE — Assessment & Plan Note (Signed)
A1c down to 5.7%- doing well. However pt gain of 10lbs. Encouraged continued diet and lifestyle changes to prevent progression of A1c. Return 24mos.

## 2016-06-25 NOTE — Assessment & Plan Note (Signed)
Reviewed treatment for carpal tunnel. Recommend conservative treatment: continued use of splints. PRN NSAIDs. Per patient preference will refer to ortho.

## 2016-06-25 NOTE — Progress Notes (Signed)
Subjective:    Patient ID: Derek Cook, male    DOB: [birth year], 55 y.o.   MRN: [id number]  HPI: TYGE NEARHOOF is a 55 y.o. male presenting on 06/25/2016 for prediabetes and Hand Pain (tingling years think may be carpel tunnel)   HPI  Pt presents for follow-up of prediabetes also have arm and elbow pain. Blood pressure is controlled. Has not been checking it at home. A1c is down to 5.7%  Has not been exercising. His shift changed at work. He feels all he does is work. Has been working 2 jobs.  Has history of carpal tunnel on the left in the past. Pain and numbness returned. Worse at night. Has gotten splints- tried last night and it helped significantly with pain.  Pt would like to see an orthopedist to determine if he is a surgical candidate for carpal tunnel release. Pain is also present along the forearm and the lateral elbow.  Pt does repetitive bending the arm in his job. Pain has been worsening over the past month. Relieved by rest and ice. No swelling. Full ROM.   Past Medical History:  Diagnosis Date  . Allergy   . Asthma   . Colon polyp   . GERD (gastroesophageal reflux disease)   . Hyperlipidemia     Current Outpatient Prescriptions on File Prior to Visit  Medication Sig  . ADVAIR DISKUS 250-50 MCG/DOSE AEPB   . aspirin EC 81 MG tablet Take 81 mg by mouth.  [employer] Bandages & Supports (MEDICAL COMPRESSION SOCKS) MISC 1 each by Does not apply route daily.  . Foot Care Products (CORN CUSHIONS) PADS 1 each by Does not apply route daily.  Marland Kitchen LIVALO 2 MG TABS   . loratadine-pseudoephedrine (CLARITIN-D 24-HOUR) 10-240 MG 24 hr tablet Take 1 tablet by mouth daily.  . Melatonin 3 MG TABS Take 10 mg by mouth.  Marland Kitchen omeprazole (PRILOSEC) 40 MG capsule Take 1 capsule (40 mg total) by mouth daily.  Marland Kitchen PROVENTIL HFA 108 (90 Base) MCG/ACT inhaler   . theophylline (THEODUR) 300 MG 12 hr tablet    No current facility-administered medications on file prior to visit.     Review of  Systems  Constitutional: Negative for chills and fever.  HENT: Negative.   Respiratory: Negative for chest tightness, shortness of breath and wheezing.   Cardiovascular: Negative for chest pain, palpitations and leg swelling.  Gastrointestinal: Negative for abdominal pain, nausea and vomiting.  Endocrine: Negative.   Genitourinary: Negative for discharge, dysuria, penile pain, testicular pain and urgency.  Musculoskeletal: Positive for myalgias. Negative for arthralgias, back pain, joint swelling, neck pain and neck stiffness.  Skin: Negative.   Neurological: Positive for numbness. Negative for dizziness, weakness and headaches.  Psychiatric/Behavioral: Negative for dysphoric mood and sleep disturbance.   Per HPI unless specifically indicated above     Objective:    BP 139/78   Pulse 85   Temp 97.8 F (36.6 C) (Oral)   Resp 16   Ht 5\' 8"  (1.727 m)   Wt 233 lb (105.7 kg)   BMI 35.43 kg/m   Wt Readings from Last 3 Encounters:  06/25/16 233 lb (105.7 kg)  04/16/16 222 lb 9.6 oz (101 kg)  03/24/16 221 lb (100.2 kg)    Physical Exam  Constitutional: He is oriented to person, place, and time. He appears well-developed and well-nourished. No distress.  HENT:  Head: Normocephalic and atraumatic.  Neck: Neck supple. No thyromegaly present.  Cardiovascular: Normal rate, regular  rhythm and normal heart sounds.  Exam reveals no gallop and no friction rub.   No murmur heard. Pulmonary/Chest: Effort normal and breath sounds normal. He has no wheezes.  Abdominal: Soft. Bowel sounds are normal. He exhibits no distension. There is no tenderness. There is no rebound.  Musculoskeletal: Normal range of motion. He exhibits no edema.       Left elbow: Tenderness found. Lateral epicondyle tenderness noted.       Left forearm: He exhibits tenderness.  Tender to palpation along the anconeus and lateral epidcondyle.  Full ROM. Strength 5/5  Neurological: He is alert and oriented to person, place,  and time. He has normal strength and normal reflexes. No cranial nerve deficit. He displays a negative Romberg sign.  Reflex Scores:      Brachioradialis reflexes are 2+ on the right side and 2+ on the left side.      Patellar reflexes are 2+ on the right side and 2+ on the left side. + Tinnel test L hand.   Skin: Skin is warm and dry. No rash noted. No erythema.  Psychiatric: He has a normal mood and affect. His behavior is normal. Thought content normal.   Results for orders placed or performed in visit on 06/25/16  POCT HgB A1C  Result Value Ref Range   Hemoglobin A1C 5.7       Assessment & Plan:   Problem List Items Addressed This Visit      Cardiovascular and Mediastinum   Hypertension    Controlled. Continue current regimen. Reviewed DASH diet. Reviewed Alarm symptoms. Return in 3 mos.       Relevant Orders   BASIC METABOLIC PANEL WITH GFR     Nervous and Auditory   Carpal tunnel syndrome, left    Reviewed treatment for carpal tunnel. Recommend conservative treatment: continued use of splints. PRN NSAIDs. Per patient preference will refer to ortho.       Relevant Medications   naproxen (NAPROSYN) 500 MG tablet   Other Relevant Orders   Ambulatory referral to Orthopedic Surgery     Other   Hyperlipidemia    Continues on Livalo. Will recheck lipid panel today. Continue heart health diet. Encouraged daily exercise. Return 3 mos.       Relevant Orders   Lipid Profile   Prediabetes - Primary    A1c down to 5.7%- doing well. However pt gain of 10lbs. Encouraged continued diet and lifestyle changes to prevent progression of A1c. Return 48mos.       Relevant Orders   POCT HgB A1C (Completed)    Other Visit Diagnoses    Lateral epicondylitis of left elbow       Likely overuse injury of the elbow. NSAID BID for 2 weeks, then PRN, Ice for comfort. Avoid extended repetitive movements. OTC bracing for pain relief.    Relevant Medications   naproxen (NAPROSYN) 500 MG  tablet      Meds ordered this encounter  Medications  . naproxen (NAPROSYN) 500 MG tablet    Sig: Take 1 tablet (500 mg total) by mouth 2 (two) times daily with a meal.    Dispense:  30 tablet    Refill:  0    Order Specific Question:   Supervising Provider    Answer:   Arlis Porta [id number]      Follow up plan: Return in about 3 months (around 09/25/2016), or if symptoms worsen or fail to improve, for Hypertension and prediabetes. Marland Kitchen

## 2016-06-25 NOTE — Assessment & Plan Note (Signed)
Continues on Livalo. Will recheck lipid panel today. Continue heart health diet. Encouraged daily exercise. Return 3 mos.

## 2016-09-09 ENCOUNTER — Other Ambulatory Visit: Payer: Self-pay | Admitting: Family Medicine

## 2016-09-09 DIAGNOSIS — L853 Xerosis cutis: Secondary | ICD-10-CM

## 2016-09-09 MED ORDER — CLOTRIMAZOLE-BETAMETHASONE 1-0.05 % EX CREA: TOPICAL_CREAM | CUTANEOUS | 1 refills | Status: DC

## 2016-09-17 ENCOUNTER — Encounter: Payer: Self-pay | Admitting: Family Medicine

## 2016-09-17 ENCOUNTER — Ambulatory Visit (INDEPENDENT_AMBULATORY_CARE_PROVIDER_SITE_OTHER): Payer: BLUE CROSS/BLUE SHIELD | Admitting: Family Medicine

## 2016-09-17 VITALS — BP 140/84 | HR 98 | Temp 97.9°F | Resp 16 | Ht 67.0 in | Wt 244.6 lb

## 2016-09-17 DIAGNOSIS — J302 Other seasonal allergic rhinitis: Secondary | ICD-10-CM | POA: Diagnosis not present

## 2016-09-17 DIAGNOSIS — H6121 Impacted cerumen, right ear: Secondary | ICD-10-CM | POA: Diagnosis not present

## 2016-09-17 DIAGNOSIS — J309 Allergic rhinitis, unspecified: Secondary | ICD-10-CM | POA: Insufficient documentation

## 2016-09-17 MED ORDER — FLUTICASONE PROPIONATE 50 MCG/ACT NA SUSP: 2.0000 | Freq: Every day | NASAL | 3 refills | Status: DC

## 2016-09-17 NOTE — Progress Notes (Signed)
Subjective:    Patient ID: Derek Cook, male    DOB: [birth year], 56 y.o.   MRN: [id number]  Derek Cook is a 56 y.o. male presenting on 09/17/2016 for Cerumen Impaction (hard of hearing onset week pt has sinus infection week ago)  HPI  RIGHT CERUMEN IMPACTION / Excessive Ear Wax / Allergic Rhinosinusitis Reports symptoms started about 1 week ago with Right ear first, now both with symptoms of feeling "clogged up" and fullness, also admits some associated sinus congestion and pressure. - Usually takes Loratadine 10mg  daily - History of BPPV in before with otolith disturbance, but currently asymptomatic from this - Admits history of some chronic seasonal allergies, in past has tried nasal saline but not regularly, never used Flonase. He states that he has seen ENT in past, and takes PPI for antacid to reduce reflux to affect his eustachian tubes with dysfunction - Admits some "muffled hearing" in Right ear, but no loss of hearing - Denies fevers/chills, sweats, sinus pain, nasal purulence, ear pain, tinnitus, dizziness, lightheadedness, headache  Social History  Substance Use Topics  . Smoking status: Former Smoker    Quit date: 09/07/2009  . Smokeless tobacco: Former Systems developer  . Alcohol use No    Review of Systems Per HPI unless specifically indicated above     Objective:    BP 140/84   Pulse 98   Temp 97.9 F (36.6 C) (Oral)   Resp 16   Ht 5\' 7"  (1.702 m)   Wt 244 lb 9.6 oz (110.9 kg)   SpO2 95%   BMI 38.31 kg/m   Wt Readings from Last 3 Encounters:  09/17/16 244 lb 9.6 oz (110.9 kg)  06/25/16 233 lb (105.7 kg)  04/16/16 222 lb 9.6 oz (101 kg)    Physical Exam  Constitutional: He appears well-developed and well-nourished. No distress.  Well-appearing, comfortable, cooperative  HENT:  Head: Normocephalic and atraumatic.  Mouth/Throat: Oropharynx is clear and moist.  Frontal / maxillary sinuses non-tender, some discomfort more localized to nasal area. Nares with some  bilateral turbinate edema with congestion without purulence.  Right TM view blocked initially by hard thick cerumen impaction. Left ear with significant cerumen more soft without obstruction, TM appears normal with mild clear effusion without erythema or bulging.  Oropharynx clear without erythema, exudates, edema or asymmetry.  No mastoid tenderness or outer ear tenderness.  Eyes: Conjunctivae are normal. Right eye exhibits no discharge. Left eye exhibits no discharge.  Neck: Normal range of motion. Neck supple. No thyromegaly present.  Cardiovascular: Normal rate and intact distal pulses.   Pulmonary/Chest: Effort normal.  Lymphadenopathy:    He has no cervical adenopathy.  Neurological: He is alert.  Skin: Skin is warm and dry. No rash noted. He is not diaphoretic. No erythema.  Nursing note and vitals reviewed.  ________________________________________________________ PROCEDURE NOTE Date: 09/17/16 Bilateral Ear Lavage / Right Ear Impacted Cerumen Removal Discussed benefits and risks (including pain / discomforts, dizziness, minor abrasion of ear canal). Verbal consent given by patient. Medication:  carbamide peroxide ear drops, Ear Lavage Solution (warm water + hydrogen peroxide) Performed by Dr Parks Ranger Several drops of carbamide peroxide placed in Right ear, allowed to sit for few minutes. Ear lavage solution flushed into one ear at a time in attempt to dislodge and remove ear wax. Results were excellent with thick cerumen impaction cleanly flushed out of R ear, and smaller pieces cerumen and debris out of L ear.  Repeat Ear Exam: - Completely removed cerumen  now, with clear ear canals and visible TMs clear and normal appearance however some mild bilateral clear effusion evident   I have personally reviewed the following lab results from 06/25/16.  Results for orders placed or performed in visit on 06/25/16  Lipid Profile  Result Value Ref Range   Cholesterol 245 (H) 125 -  200 mg/dL   Triglycerides 773 (H) <150 mg/dL   HDL 39 (L) >=40 mg/dL   Total CHOL/HDL Ratio 6.3 (H) <=5.0 Ratio   VLDL NOT CALC <30 mg/dL   LDL Cholesterol NOT CALC <130 mg/dL  BASIC METABOLIC PANEL WITH GFR  Result Value Ref Range   Sodium 138 135 - 146 mmol/L   Potassium 4.4 3.5 - 5.3 mmol/L   Chloride 103 98 - 110 mmol/L   CO2 23 20 - 31 mmol/L   Glucose, Bld 84 65 - 99 mg/dL   BUN 16 7 - 25 mg/dL   Creat 0.96 0.70 - 1.33 mg/dL   Calcium 9.3 8.6 - 10.3 mg/dL   GFR, Est African American >89 >=60 mL/min   GFR, Est Non African American >89 >=60 mL/min  POCT HgB A1C  Result Value Ref Range   Hemoglobin A1C 5.7       Assessment & Plan:   Problem List Items Addressed This Visit    Allergic rhinitis due to allergen    Consistent with chronic rhinosinusitis, 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  Plan: 1. Reassurance, likely self-limited - no indication for antibiotics at this time 2. Resume Loratadine (Claritin) 10mg  daily and Start new rx Flonase 2 sprays in each nostril daily for next 4-6 weeks, then may stop and use seasonally or as needed 3. Supportive care with nasal saline OTC, hydration 4. Return criteria reviewed      Relevant Medications   fluticasone (FLONASE) 50 MCG/ACT nasal spray    Other Visit Diagnoses    Impacted cerumen of right ear    -  Primary  Significant amount of large thick cerumen bilaterally with Right ear impaction obstructing TM, suspected primary cause of current reduced R hearing and ear fullness.  Plan: 1. Successful office ear lavage cerumen removal today, re-evaluated with clear ear canals and normal TMs 2. Counseled on avoiding Q-tips and may use Kleenex as wick, use OTC Debrox as needed 3. Follow-up as needed       Meds ordered this encounter  Medications  . fluticasone (FLONASE) 50 MCG/ACT nasal spray    Sig: Place 2 sprays into both nostrils daily. Use for 4-6 weeks then stop and use  seasonally or as needed.    Dispense:  16 g    Refill:  3      Follow up plan: Return in about 3 months (around 12/16/2016) for Pre-DM.  [name], [facility] 09/17/2016, 8:55 AM

## 2016-09-17 NOTE — Patient Instructions (Signed)
Thank you for coming in to clinic today.  You have thick impacted ear wax (cerumen) blocking ear canals and ear drums. This is the most likely cause of reduced hearing and ear pain and discomfort. - We were able to remove almost all of the ear wax with flushing in the office today  Recommend using the same ear drops at home if the future if ear wax builds up again, over the counter Debrox (Carbamide peroxide), use on both sides following instructions on bottle, pharmacist will direct you to the appropriate ear drops if you need help. May take a week or more.  Avoid using Q-tips inside ears, as this can push wax deeper, but you can try to use rolled up kleenex as a wick to absorb fluid and wax as well.  If you are not making progress with ear wax removal at home, or the problem keeps coming back, please notify our office or return for re-evaluation.  Try Flonase nasal steroid, try 2 sprays in each nostril daily for 4-6 weeks to see if improves any sinus congestion and pressure  Try Fish Oil OTC to help reduce triglycerides.  Please schedule a follow-up appointment with Dr. Parks Ranger in 3 months for Pre-Diabetes, Cholesterol, then next visit 6 months for Annual Physical in 06/2017  If you have any other questions or concerns, please feel free to call the clinic or send a message through [place]. You may also schedule an earlier appointment if necessary.  [name], DO [place]

## 2016-09-17 NOTE — Assessment & Plan Note (Signed)
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  Plan: 1. Reassurance, likely self-limited - no indication for antibiotics at this time 2. Resume Loratadine (Claritin) 10mg  daily and Start new rx Flonase 2 sprays in each nostril daily for next 4-6 weeks, then may stop and use seasonally or as needed 3. Supportive care with nasal saline OTC, hydration 4. Return criteria reviewed

## 2016-09-29 ENCOUNTER — Ambulatory Visit: Payer: BLUE CROSS/BLUE SHIELD | Admitting: Family Medicine

## 2016-12-01 ENCOUNTER — Telehealth: Payer: Self-pay | Admitting: Family Medicine

## 2016-12-01 DIAGNOSIS — E782 Mixed hyperlipidemia: Secondary | ICD-10-CM

## 2016-12-01 MED ORDER — LIVALO 2 MG PO TABS: 2.0000 mg | ORAL_TABLET | Freq: Every day | ORAL | 11 refills | Status: DC

## 2016-12-01 NOTE — Telephone Encounter (Signed)
Pt needs a refill on livalo sent to [facility].  His call back number is [phone]

## 2016-12-01 NOTE — Telephone Encounter (Signed)
Refilled Livalo 2mg  daily (Pitavastatin) #30 +11 refills  [name], DO [facility] 12/01/2016, 5:15 PM

## 2016-12-08 ENCOUNTER — Other Ambulatory Visit: Payer: Self-pay | Admitting: Family Medicine

## 2016-12-08 DIAGNOSIS — J4530 Mild persistent asthma, uncomplicated: Secondary | ICD-10-CM

## 2016-12-08 MED ORDER — ADVAIR DISKUS 250-50 MCG/DOSE IN AEPB: 1.0000 | INHALATION_SPRAY | Freq: Two times a day (BID) | RESPIRATORY_TRACT | 11 refills | Status: DC

## 2016-12-10 ENCOUNTER — Encounter: Payer: Self-pay | Admitting: Nurse Practitioner

## 2016-12-10 ENCOUNTER — Encounter: Payer: Self-pay | Admitting: *Deleted

## 2016-12-10 ENCOUNTER — Ambulatory Visit (INDEPENDENT_AMBULATORY_CARE_PROVIDER_SITE_OTHER): Payer: BLUE CROSS/BLUE SHIELD | Admitting: Nurse Practitioner

## 2016-12-10 VITALS — BP 139/79 | HR 98 | Temp 97.7°F | Resp 16 | Ht 67.0 in | Wt 248.0 lb

## 2016-12-10 DIAGNOSIS — J101 Influenza due to other identified influenza virus with other respiratory manifestations: Secondary | ICD-10-CM

## 2016-12-10 DIAGNOSIS — R6889 Other general symptoms and signs: Secondary | ICD-10-CM | POA: Diagnosis not present

## 2016-12-10 DIAGNOSIS — H6123 Impacted cerumen, bilateral: Secondary | ICD-10-CM

## 2016-12-10 LAB — POCT INFLUENZA A/B
Influenza A, POC: POSITIVE — AB
Influenza B, POC: NEGATIVE

## 2016-12-10 MED ORDER — IPRATROPIUM BROMIDE 0.06 % NA SOLN: 2.0000 | Freq: Four times a day (QID) | NASAL | 12 refills | Status: DC

## 2016-12-10 MED ORDER — BENZONATATE 100 MG PO CAPS: 100.0000 mg | ORAL_CAPSULE | Freq: Two times a day (BID) | ORAL | 0 refills | Status: AC | PRN

## 2016-12-10 MED ORDER — OSELTAMIVIR PHOSPHATE 75 MG PO CAPS: 75.0000 mg | ORAL_CAPSULE | Freq: Two times a day (BID) | ORAL | 0 refills | Status: AC

## 2016-12-10 MED ORDER — OSELTAMIVIR PHOSPHATE 75 MG PO CAPS: 75.0000 mg | ORAL_CAPSULE | Freq: Two times a day (BID) | ORAL | Status: DC

## 2016-12-10 NOTE — Progress Notes (Signed)
Subjective:    Patient ID: Derek Cook, male    DOB: Dec 28, 1960, 56 y.o.   MRN: [id number]  Derek Cook is a 56 y.o. 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  He has had potential exposure at Derek Cook.  Onset of symptoms was Tuesday with allergy symptoms.  He felt worse on Wednesday with aches, fever, chills, nasal congestion, cough, nausea, and diarrhea.  He wasn't able to sleep last night.   He denies vomiting, and sweats.  He has been using flonase and claritin 24 hour during allergy season.  He took 400 mg ibuprofen tonight and this am with some relief.  Associated symptoms: tinnitus is worse than normal & ear fullness. He notes he has new hearing aids and there may be ear wax buildup.   Social History  Substance Use Topics  . Smoking status: Former Smoker    Quit date: 09/07/2009  . Smokeless tobacco: Former Systems developer  . Alcohol use No    Review of Systems After physical exam and ausculating one irregular beat, he states his heart races every once in a while and returns to normal.  He has a very physical job and has no problems with lifting.  Denies palpitations.  Per HPI unless specifically indicated above      Objective:    BP 139/79 (BP Location: Left Arm, Patient Position: Sitting, Cuff Size: Large)   Pulse 98   Temp 97.7 F (36.5 C) (Oral)   Resp 16   Ht 5\' 7"  (1.702 m)   Wt 248 lb (112.5 kg)   BMI 38.84 kg/m   Wt Readings from Last 3 Encounters:  12/10/16 248 lb (112.5 kg)  09/17/16 244 lb 9.6 oz (110.9 kg)  06/25/16 233 lb (105.7 kg)    Physical Exam  Constitutional: He is oriented to person, place, and time. He appears well-developed and well-nourished.  He appears unwell.  HENT:  Head: Normocephalic and atraumatic.  Right Ear: Tympanic membrane, external ear and ear canal normal. Decreased hearing is noted.  Left Ear: Tympanic  membrane, external ear and ear canal normal. Decreased hearing is noted.  Nose: Mucosal edema and rhinorrhea present. Right sinus exhibits no maxillary sinus tenderness and no frontal sinus tenderness. Left sinus exhibits no maxillary sinus tenderness and no frontal sinus tenderness.  Mouth/Throat: Uvula is midline, oropharynx is clear and moist and mucous membranes are normal.  Prior to lavage, bilateral TM not visible.  Eyes: Conjunctivae are normal. Pupils are equal, round, and reactive to light.  Neck: Normal range of motion. Neck supple.  Cardiovascular: Normal rate, regular rhythm, S1 normal, S2 normal and intact distal pulses.   Occasional extrasystoles are present.  Pulmonary/Chest: Effort normal and breath sounds normal. No respiratory distress. He has no wheezes. He has no rales.  Lymphadenopathy:    He has cervical adenopathy.  Neurological: He is alert and oriented to person, place, and time.  Skin: Skin is warm and dry.  Psychiatric: He has a normal mood and affect. His behavior is normal.  Vitals reviewed.  Results for orders placed or performed in visit on 12/10/16  POCT Influenza A/B  Result Value Ref Range   Influenza A, POC Positive (A) Negative   Influenza B, POC Negative Negative      Assessment & Plan:   Problem List Items Addressed This Visit    None    Visit  Diagnoses    Influenza A    -  Primary 1. Take tamiflu 75 mg bid for 5 days. 2. Symptom management with atrovent nasal spray 2 sprays 4x daily for 7 days, tessalon 100 mg 1 cap bid, dextromethorphan/guaifenesin per OTC package.  Avoid decongestants. 3. Tylenol and limit ibuprofen for body aches and pains. 4. Encourage fluid. 5. Educated about spread of infection - droplet.  Wash hands, cover cough. 6. Instructed importance of his wife seeking quick treatment if she starts feeling unwell.    Relevant Medications   ipratropium (ATROVENT) 0.06 % nasal spray   benzonatate (TESSALON) 100 MG capsule    oseltamivir (TAMIFLU) 75 MG capsule   Flu-like symptoms       Relevant Orders   POCT Influenza A/B (Completed)   Cerumen debris on tympanic membrane of both ears 1. Irrigation and lavage bilateral ears. 2. Educated about excess cerumen and impaction increase with hearing aids. 3. Use debrox OTC in each ear at night. Leave hearing aids out.  Clear remaining solution before inserting hearing aids each am.         Meds ordered this encounter  Medications  . DISCONTD: oseltamivir (TAMIFLU) capsule 75 mg  . ipratropium (ATROVENT) 0.06 % nasal spray    Sig: Place 2 sprays into both nostrils 4 (four) times daily.    Dispense:  15 mL    Refill:  12    Order Specific Question:   Supervising Provider    Answer:   Olin Hauser [[id number]]  . benzonatate (TESSALON) 100 MG capsule    Sig: Take 1 capsule (100 mg total) by mouth 2 (two) times daily as needed for cough.    Dispense:  20 capsule    Refill:  0    Order Specific Question:   Supervising Provider    Answer:   Olin Hauser [[id number]]  . oseltamivir (TAMIFLU) 75 MG capsule    Sig: Take 1 capsule (75 mg total) by mouth 2 (two) times daily.    Dispense:  10 capsule    Refill:  0    Order Specific Question:   Supervising Provider    Answer:   Olin Hauser [[id number]]      Follow up plan: Return if symptoms worsen or fail to improve.   Cassell Smiles, DNP, AGPCNP-BC Adult Gerontology Primary Care Nurse Practitioner [facility] 12/10/2016, 12:20 PM

## 2016-12-10 NOTE — Progress Notes (Signed)
I have reviewed this encounter including the documentation in this note and/or discussed this patient with the provider, Cassell Smiles, AGPCNP-BC. I am certifying that I agree with the content of this note as supervising physician.  [name], [facility] 12/10/2016, 1:04 PM

## 2016-12-10 NOTE — Patient Instructions (Addendum)
Derek Cook, Thank you for coming in to clinic today.  1. Your flu test was POSITIVE.  It is possible you can still have the flu with a negative test, otherwise it could be a different virus causing your symptoms.  You tested positive for Influenza A today (rapid flu swab test in office) - Start Tamiflu (anti-flu medicine) take one capsule 75mg  twice a day for 5 days - Wash hands and cover cough very well to avoid spread of infection - For symptom control:      - Take Ibuprofen / Advil 400-600mg  every 6-8 hours as needed for fever / muscle aches.  You may also take Tylenol 500-1000mg  per dose every 6-8 hours or 3 times a day.  You can alternate dosing and take both in the same day.      - Do not take more than 3,000 mg acetaminophen (Tylenol) in a day.      - Start Tessalon perles one every 8 hours or 3 times a day as needed for cough.      - Start Atrovent nasal spray decongestant 2 sprays in each nostril up to 4 times daily for 7 days.      - Start OTC Mucinex-DM or Robitussin DM for cough and congestion for up to 7 days. - Improve hydration with plenty of clear fluids.  Drink up to 8 glasses of water / fluids each day.  If significant worsening with poor fluid intake, worsening fever, difficulty breathing due to coughing, worsening body aches, weakness, or other more concerning symptoms difficulty breathing you can seek treatment at Emergency Department. If your flu symptoms have improved and then get worse several days to a week later with concerns for bronchitis, productive cough, fever, and chills we may need to check for possible pneumonia that can occur after the flu.  2. For regular ear wax buildup, you can use.  Debrox ear drops for 3-5 days to soften this ear wax.  Use a tissue or a washcloth to remove this as it starts to drain out.  Do not use q-tips.  Please schedule a follow-up appointment with Cassell Smiles, AGNP in 1-2 weeks as needed if worsening from Flu / Bronchitis.  If you have  any other questions or concerns, please feel free to call the clinic or send a message through [place]. You may also schedule an earlier appointment if necessary.  Cassell Smiles, DNP, AGNP-BC Adult Gerontology Nurse Practitioner [facility], [facility]   Influenza, Adult Influenza, more commonly known as "the flu," is a viral infection that primarily affects the respiratory tract. The respiratory tract includes organs that help you breathe, such as the lungs, nose, and throat. The flu causes many common cold symptoms, as well as a high fever and body aches. The flu spreads easily from person to person (is contagious). Getting a flu shot (influenza vaccination) every year is the best way to prevent influenza. What are the causes? Influenza is caused by a virus. You can catch the virus by:  Breathing in droplets from an infected person's cough or sneeze.  Touching something that was recently contaminated with the virus and then touching your mouth, nose, or eyes. What increases the risk? The following factors may make you more likely to get the flu:  Not cleaning your hands frequently with soap and water or alcohol-based hand sanitizer.  Having close contact with many people during cold and flu season.  Touching your mouth, eyes, or nose without washing or sanitizing your  hands first.  Not drinking enough fluids or not eating a healthy diet.  Not getting enough sleep or exercise.  Being under a high amount of stress.  Not getting a yearly (annual) flu shot. You may be at a higher risk of complications from the flu, such as a severe lung infection (pneumonia), if you:  Are over the age of 27.  Are pregnant.  Have a weakened disease-fighting system (immune system). You may have a weakened immune system if you:  Have HIV or AIDS.  Are undergoing chemotherapy.  Aretaking medicines that reduce the activity of (suppress) the immune system.  Have a long-term (chronic)  illness, such as heart disease, kidney disease, diabetes, or lung disease.  Have a liver disorder.  Are obese.  Have anemia. What are the signs or symptoms? Symptoms of this condition typically last 4-10 days and may include:  Fever.  Chills.  Headache, body aches, or muscle aches.  Sore throat.  Cough.  Runny or congested nose.  Chest discomfort and cough.  Poor appetite.  Weakness or tiredness (fatigue).  Dizziness.  Nausea or vomiting. How is this diagnosed? This condition may be diagnosed based on your medical history and a physical exam. Your health care provider may do a nose or throat swab test to confirm the diagnosis. How is this treated? If influenza is detected early, you can be treated with antiviral medicine that can reduce the length of your illness and the severity of your symptoms. This medicine may be given by mouth (orally) or through an IV tube that is inserted in one of your veins. The goal of treatment is to relieve symptoms by taking care of yourself at home. This may include taking over-the-counter medicines, drinking plenty of fluids, and adding humidity to the air in your home. In some cases, influenza goes away on its own. Severe influenza or complications from influenza may be treated in a hospital. Follow these instructions at home:  Take over-the-counter and prescription medicines only as told by your health care provider.  Use a cool mist humidifier to add humidity to the air in your home. This can make breathing easier.  Rest as needed.  Drink enough fluid to keep your urine clear or pale yellow.  Cover your mouth and nose when you cough or sneeze.  Wash your hands with soap and water often, especially after you cough or sneeze. If soap and water are not available, use hand sanitizer.  Stay home from work or school as told by your health care provider. Unless you are visiting your health care provider, try to avoid leaving home until  your fever has been gone for 24 hours without the use of medicine.  Keep all follow-up visits as told by your health care provider. This is important. How is this prevented?  Getting an annual flu shot is the best way to avoid getting the flu. You may get the flu shot in late summer, fall, or winter. Ask your health care provider when you should get your flu shot.  Wash your hands often or use hand sanitizer often.  Avoid contact with people who are sick during cold and flu season.  Eat a healthy diet, drink plenty of fluids, get enough sleep, and exercise regularly. Contact a health care provider if:  You develop new symptoms.  You have:  Chest pain.  Diarrhea.  A fever.  Your cough gets worse.  You produce more mucus.  You feel nauseous or you vomit. Get help  right away if:  You develop shortness of breath or difficulty breathing.  Your skin or nails turn a bluish color.  You have severe pain or stiffness in your neck.  You develop a sudden headache or sudden pain in your face or ear.  You cannot stop vomiting. This information is not intended to replace advice given to you by your health care provider. Make sure you discuss any questions you have with your health care provider. Document Released: 08/21/2000 Document Revised: 01/30/2016 Document Reviewed: 06/18/2015 Elsevier Interactive Patient Education  2017 [employer].

## 2016-12-11 ENCOUNTER — Telehealth: Payer: Self-pay | Admitting: *Deleted

## 2016-12-11 NOTE — Telephone Encounter (Signed)
PA- for Livalo 2mg  has been submitted to [facility] for approval. It may take up to 48 hrs.

## 2016-12-16 ENCOUNTER — Telehealth: Payer: Self-pay | Admitting: *Deleted

## 2016-12-16 NOTE — Telephone Encounter (Signed)
auth # [id number]

## 2016-12-16 NOTE — Telephone Encounter (Signed)
Livalo approved 12/15/16 thru 12/15/2017. This approval faxed to pharmacy.

## 2016-12-17 ENCOUNTER — Telehealth: Payer: Self-pay | Admitting: Nurse Practitioner

## 2016-12-17 DIAGNOSIS — J069 Acute upper respiratory infection, unspecified: Secondary | ICD-10-CM

## 2016-12-17 MED ORDER — AMOXICILLIN 500 MG PO TABS: 500.0000 mg | ORAL_TABLET | Freq: Two times a day (BID) | ORAL | 0 refills | Status: AC

## 2016-12-17 NOTE — Telephone Encounter (Signed)
Duration of symptoms > 10 days.  I have sent amoxicillin 500 mg q 12 hrs for 10 days to his pharmacy on file.

## 2016-12-17 NOTE — Telephone Encounter (Signed)
Pt still has a productive cough and head congestion.  He finished tamailu and asked if he should have antibiotics.  His call back number is [phone]

## 2016-12-18 NOTE — Telephone Encounter (Signed)
Left detailed message.   

## 2016-12-23 ENCOUNTER — Ambulatory Visit: Payer: BLUE CROSS/BLUE SHIELD | Admitting: Family Medicine

## 2016-12-29 ENCOUNTER — Ambulatory Visit (INDEPENDENT_AMBULATORY_CARE_PROVIDER_SITE_OTHER): Payer: BLUE CROSS/BLUE SHIELD | Admitting: Nurse Practitioner

## 2016-12-29 ENCOUNTER — Encounter: Payer: Self-pay | Admitting: Nurse Practitioner

## 2016-12-29 VITALS — BP 141/81 | HR 84 | Temp 98.2°F | Resp 16 | Ht 68.0 in | Wt 246.0 lb

## 2016-12-29 DIAGNOSIS — K047 Periapical abscess without sinus: Secondary | ICD-10-CM | POA: Diagnosis not present

## 2016-12-29 MED ORDER — AMOXICILLIN-POT CLAVULANATE 875-125 MG PO TABS: 1.0000 | ORAL_TABLET | Freq: Two times a day (BID) | ORAL | 0 refills | Status: AC

## 2016-12-29 MED ORDER — TRAMADOL HCL 50 MG PO TABS: 50.0000 mg | ORAL_TABLET | Freq: Three times a day (TID) | ORAL | 0 refills | Status: DC | PRN

## 2016-12-29 NOTE — Patient Instructions (Signed)
Derek Cook, Thank you for coming in to clinic today.  You likely have a dental abscess.  1. Take Augmentin 875-125 mg 1 tablet every 12 hours for 10 days. 2. Take tramadol 50 mg every 8 hours as needed for pain for the next 5 days. 3. Start taking Tylenol extra strength 1 to 2 tablets every 6-8 hours for aches or fever/chills for next few days as needed.  Do not take more than 3,000 mg in 24 hours from all medicines.  May take Ibuprofen as well if tolerated 200-400mg  every 8 hours as needed.  Do not continue for longer than 2 weeks.  Avoid taking more ibuprofen than this. 4. When you call the oral surgeons, ask about cash pay for 4-5 extractions with possible abscess.  Let me know if you need a referral.  I am more than willing to write one.  Please schedule a follow-up appointment with Derek Cook, AGNP in 5-17 days as needed.  If you have any other questions or concerns, please feel free to call the clinic or send a message through [place]. You may also schedule an earlier appointment if necessary.  Derek Smiles, DNP, AGNP-BC Adult Gerontology Nurse Practitioner [facility], [facility] [phone] Select option 1 for emergencies Location: [facility], [place], [place], [place] [facility] Hours: No walk-ins accepted - call the day before to schedule an appointment. Check in times are 9:30 am and 1:30 pm. Services: Simple extractions, temporary fillings, pulpectomy/pulp debridement, uncomplicated abscess drainage. Payment Options: PAYMENT IS DUE AT THE TIME OF SERVICE. Fee is usually $100-200, additional surgical procedures (e.g. abscess drainage) may be extra. Cash, checks, Visa/MasterCard accepted. Can file Medicaid if patient is covered for dental - patient should call case worker to check. No discount for [facility] patients. Best way to get seen: MUST call the day before and get onto the  schedule. Can usually be seen the next 1-2 days. No walk-ins accepted.   [place] [phone] Location: [place], [place] [facility] Hours: M, W, Th, F 8am or 1:30pm, Tues 9a or 1:30 - first come/first served. Services: Simple extractions, temporary fillings, uncomplicated abscess drainage. You do not need to be an [facility] resident. Payment Options: PAYMENT IS DUE AT THE TIME OF SERVICE. Dental insurance, otherwise sliding scale - bring proof of income or support. Depending on income and treatment needed, cost is usually $50-200. Best way to get seen: Arrive early as it is first come/first served.   [facility] [phone] Location: [place] [facility] Hours: Mon-Thu 8a-5p Services: Most basic dental services including extractions and fillings. Payment Options: PAYMENT IS DUE AT THE TIME OF SERVICE. Sliding scale, up to 50% off - bring proof if income or support. Medicaid with dental option accepted. Best way to get seen: Call to schedule an appointment, can usually be seen within 2 weeks OR they will try to see walk-ins - show up at [place] or 2p (you may have to wait).    Dental Abscess A dental abscess is a collection of pus in or around a tooth. What are the causes? This condition is caused by a bacterial infection around the root of the tooth that involves the inner part of the tooth (pulp). It may result from:  Severe tooth decay.  Trauma to the tooth that allows bacteria to enter into the pulp, such as a  broken or chipped tooth.  Severe gum disease around a tooth. What are the signs or symptoms? Symptoms of this condition include:  Severe pain in and around the infected tooth.  Swelling and redness around the infected tooth, in the mouth, or in the face.  Tenderness.  Pus drainage.  Bad breath.  Bitter taste in the mouth.  Difficulty  swallowing.  Difficulty opening the mouth.  Nausea.  Vomiting.  Chills.  Swollen neck glands.  Fever. How is this diagnosed? This condition is diagnosed with examination of the infected tooth. During the exam, your dentist may tap on the infected tooth. Your dentist will also ask about your medical and dental history and may order X-rays. How is this treated? This condition is treated by eliminating the infection. This may be done with:  Antibiotic medicine.  A root canal. This may be performed to save the tooth.  Pulling (extracting) the tooth. This may also involve draining the abscess. This is done if the tooth cannot be saved. Follow these instructions at home:  Take medicines only as directed by your dentist.  If you were prescribed antibiotic medicine, finish all of it even if you start to feel better.  Rinse your mouth (gargle) often with salt water to relieve pain or swelling.  Do not drive or operate heavy machinery while taking pain medicine.  Do not apply heat to the outside of your mouth.  Keep all follow-up visits as directed by your dentist. This is important. Contact a health care provider if:  Your pain is worse and is not helped by medicine. Get help right away if:  You have a fever or chills.  Your symptoms suddenly get worse.  You have a very bad headache.  You have problems breathing or swallowing.  You have trouble opening your mouth.  You have swelling in your neck or around your eye. This information is not intended to replace advice given to you by your health care provider. Make sure you discuss any questions you have with your health care provider. Document Released: 08/24/2005 Document Revised: 01/02/2016 Document Reviewed: 08/21/2014 Elsevier Interactive Patient Education  2017 [employer].

## 2016-12-29 NOTE — Progress Notes (Signed)
I have reviewed this encounter including the documentation in this note and/or discussed this patient with the provider, Cassell Smiles, AGPCNP-BC. I am certifying that I agree with the content of this note as supervising physician.  [name], [facility] 12/29/2016, 4:42 PM

## 2016-12-29 NOTE — Progress Notes (Signed)
Subjective:    Patient ID: Derek Cook, male    DOB: May 28, 1961, 56 y.o.   MRN: [id number]  Derek Cook is a 56 y.o. male presenting on 12/29/2016 for Oral Swelling (Patient here today C/O of pain and swelling on bottom gum. Patient denies any new injuries or fever. Patient reports having swelling and pain on in off x's several years. )   HPI  Tooth Abscess First started noticing pain on Monday 1-2 am when he was working.  He has had a throbbing pain in his front lower jaw.  He also has associated headache, right sided facial pain, upper front jaw pain.  His pain is associated with cold sensation in his jaw.  He admits long-term issues with teeth. He was unable to sleep last night r/t the throbbing pain in his front lower jaw and reports taking four 500 mg ibuprofen (2,000 mg total) with two doses 5-6 hours apart.  Was able to sleep after his second dose, but notes he cannot tolerate that much ibuprofen anymore with some GI upset.  Used orajel Monday with a little relief.  He denies fever, chills, sweats, nausea and vomiting.  He only has 4-5 teeth, all of which are broken.  No other teeth remain on upper mouth.  Social History  Substance Use Topics  . Smoking status: Former Smoker    Quit date: 09/07/2009  . Smokeless tobacco: Former Systems developer  . Alcohol use No    Review of Systems Per HPI unless specifically indicated above     Objective:    BP (!) 141/81 (BP Location: Left Arm, Patient Position: Sitting, Cuff Size: Large)   Pulse 84   Temp 98.2 F (36.8 C) (Oral)   Resp 16   Ht 5\' 8"  (1.727 m)   Wt 246 lb (111.6 kg)   SpO2 98%   BMI 37.40 kg/m   Wt Readings from Last 3 Encounters:  12/29/16 246 lb (111.6 kg)  12/10/16 248 lb (112.5 kg)  09/17/16 244 lb 9.6 oz (110.9 kg)    Physical Exam  Constitutional: He is oriented to person, place, and time. He appears well-developed and well-nourished.  HENT:  Head: Normocephalic and atraumatic.  Right Ear: Tympanic membrane,  external ear and ear canal normal.  Left Ear: Tympanic membrane, external ear and ear canal normal.  Nose: Nose normal. Right sinus exhibits no maxillary sinus tenderness and no frontal sinus tenderness. Left sinus exhibits no maxillary sinus tenderness and no frontal sinus tenderness.  Mouth/Throat: Oropharynx is clear and moist. Abnormal dentition. Dental abscesses and dental caries present. No uvula swelling.    Eyes: Conjunctivae are normal. Pupils are equal, round, and reactive to light.  Neck: Normal range of motion. Neck supple.  Submental adenopathy present  Lymphadenopathy:    He has no cervical adenopathy.  Neurological: He is alert and oriented to person, place, and time.  Skin: Skin is warm and dry.  Psychiatric: He has a normal mood and affect. His behavior is normal. Judgment and thought content normal.    Anterior view: dental abscess on far right tooth   Upper view:       Assessment & Plan:   Problem List Items Addressed This Visit    None    Visit Diagnoses    Dental abscess    -  Primary Pt with poor dentition.  Worsening with acute abscess.  Pt desires dental extractions.  Plan: 1.  Provided patient resources for reduced and free dental care. Also  provided instructions to call area oral surgeons and ask about cash pay options for 4-5 extractions with abscess. 2. Take Augmentin 178-125 mg tablet every 12 hours for 10 days. 3. Take tramadol 50 mg every 8 hours as needed for pain for the next 5 days. 4. Can take Tylenol extra strength 1 to 2 tablets every 6-8 hours for aches or fever/chills for next few days as needed.  Do not take more than 3,000 mg in 24 hours from all medicines.  May take Ibuprofen as well if tolerated 200-400mg  every 8 hours as needed.  Do not continue for longer than 2 weeks.  Cautioned patient about ibuprofen overdoses and GI risks of regular ibuprofen use.   Relevant Medications   amoxicillin-clavulanate (AUGMENTIN) 875-125 MG tablet    traMADol (ULTRAM) 50 MG tablet      Meds ordered this encounter  Medications  . amoxicillin-clavulanate (AUGMENTIN) 875-125 MG tablet    Sig: Take 1 tablet by mouth 2 (two) times daily.    Dispense:  20 tablet    Refill:  0    Order Specific Question:   Supervising Provider    Answer:   Olin Hauser [[id number]]  . traMADol (ULTRAM) 50 MG tablet    Sig: Take 1 tablet (50 mg total) by mouth every 8 (eight) hours as needed.    Dispense:  15 tablet    Refill:  0    Order Specific Question:   Supervising Provider    Answer:   Olin Hauser [[id number]]     Follow up plan: Return if symptoms worsen or fail to improve.  Cassell Smiles, DNP, AGPCNP-BC Adult Gerontology Primary Care Nurse Practitioner [facility] 12/29/2016, 2:24 PM

## 2017-04-02 ENCOUNTER — Other Ambulatory Visit: Payer: Self-pay | Admitting: Family Medicine

## 2017-04-02 ENCOUNTER — Ambulatory Visit: Payer: BLUE CROSS/BLUE SHIELD | Admitting: Family Medicine

## 2017-04-02 ENCOUNTER — Other Ambulatory Visit: Payer: Self-pay

## 2017-04-02 DIAGNOSIS — S70261A Insect bite (nonvenomous), right hip, initial encounter: Secondary | ICD-10-CM

## 2017-04-02 DIAGNOSIS — W57XXXA Bitten or stung by nonvenomous insect and other nonvenomous arthropods, initial encounter: Principal | ICD-10-CM

## 2017-04-02 MED ORDER — DOXYCYCLINE HYCLATE 100 MG PO TABS: 100.0000 mg | ORAL_TABLET | Freq: Two times a day (BID) | ORAL | 0 refills | Status: DC

## 2017-04-02 NOTE — Progress Notes (Signed)
Patient arrived late for appointment, and has concern Tick Bite R hip, he has history of RMSF, concerned for tick borne illness, but currently asymptomatic, unable to see him due to schedule, agree to check blood as requested for lyme / RMSF ab, and will empirically treat with Doxycycline, he agrees to close follow-up to go over results , re-check tick bites, and discuss doxy course. No exam or office visit performed today.  [name], [facility] 04/02/2017, 10:25 AM

## 2017-04-05 ENCOUNTER — Encounter: Payer: Self-pay | Admitting: Family Medicine

## 2017-04-05 ENCOUNTER — Ambulatory Visit (INDEPENDENT_AMBULATORY_CARE_PROVIDER_SITE_OTHER): Payer: BLUE CROSS/BLUE SHIELD | Admitting: Family Medicine

## 2017-04-05 VITALS — BP 123/78 | HR 77 | Temp 98.4°F | Resp 16 | Ht 68.0 in | Wt 241.0 lb

## 2017-04-05 DIAGNOSIS — E782 Mixed hyperlipidemia: Secondary | ICD-10-CM | POA: Diagnosis not present

## 2017-04-05 DIAGNOSIS — M791 Myalgia, unspecified site: Secondary | ICD-10-CM

## 2017-04-05 DIAGNOSIS — M722 Plantar fascial fibromatosis: Secondary | ICD-10-CM | POA: Diagnosis not present

## 2017-04-05 DIAGNOSIS — T466X5A Adverse effect of antihyperlipidemic and antiarteriosclerotic drugs, initial encounter: Secondary | ICD-10-CM

## 2017-04-05 DIAGNOSIS — W57XXXD Bitten or stung by nonvenomous insect and other nonvenomous arthropods, subsequent encounter: Secondary | ICD-10-CM | POA: Diagnosis not present

## 2017-04-05 DIAGNOSIS — R7309 Other abnormal glucose: Secondary | ICD-10-CM | POA: Diagnosis not present

## 2017-04-05 DIAGNOSIS — E781 Pure hyperglyceridemia: Secondary | ICD-10-CM

## 2017-04-05 DIAGNOSIS — S70261D Insect bite (nonvenomous), right hip, subsequent encounter: Secondary | ICD-10-CM

## 2017-04-05 LAB — ROCKY MTN SPOTTED FVR ABS PNL(IGG+IGM)
RMSF IGG: NOT DETECTED
RMSF IGM: NOT DETECTED

## 2017-04-05 LAB — LYME AB/WESTERN BLOT REFLEX: B burgdorferi Ab IgG+IgM: <0.9 Index (ref <0.90)

## 2017-04-05 NOTE — Patient Instructions (Addendum)
Thank you for coming to the clinic today.  1.  STOP Livalo (Pitavastatin) completely - for up to 1-2 weeks or until symptoms completely resolve  If we determined that it was the medicine, then start back slowly gradually inc  - Take 1 pill at bedtime ONCE a week for 1-2 weeks - Then increase twice weekly for 1-2 weeks - Up to max dose every OTHER day  Consider other cholesterol medicines - Repatha (new PSK9 inhibitor, injectable, patch - no muscle aches) - Fenofibrate or Gemfibrozil (Triglyceride lowering meds)  [place] Address: [address] [place], [place] [address] Hours: Open 8AM-5PM Phone: [phone]  DUE for FASTING BLOOD WORK (no food or drink after midnight before the lab appointment, only water or coffee without cream/sugar on the morning of)  SCHEDULE "Lab Only" visit in the morning at the clinic for lab draw in 4 MONTHS   - Make sure Lab Only appointment is at about 1 week before your next appointment, so that results will be available  For Lab Results, once available within 2-3 days of blood draw, you can can log in to MyChart online to view your results and a brief explanation. Also, we can discuss results at next follow-up visit.  Please schedule a Follow-up Appointment to: Return in about 4 months (around 08/06/2017) for Hyperlipidemia.  If you have any other questions or concerns, please feel free to call the clinic or send a message through [place]. You may also schedule an earlier appointment if necessary.  Additionally, you may be receiving a survey about your experience at our clinic within a few days to 1 week by e-mail or mail. We value your feedback.  [name], DO [facility], [facility]               Plantar Fascia Stretches / Exercises  See other page with pictures of each exercise.  Start with 1 or 2 of these exercises that you are most comfortable with. Do not do any exercises that  cause you significant worsening pain. Some of these may cause some "stretching soreness" but it should go away after you stop the exercise, and get better over time. Gradually increase up to 3-4 exercises as tolerated.  You may begin exercising the muscles of your foot right away by gently stretching them as follows:  Stretching: Towel stretch: Sit on a hard surface with your injured leg stretched out in front of you. Loop a towel around the ball of your foot and pull the towel toward your body keeping your knee straight. Hold this position for 15 to 30 seconds then relax. Repeat 3 times. When the towel stretch becomes to easy, you may begin doing the standing calf stretch.  Standing calf stretch: Facing a wall, put your hands against the wall at about eye level. Keep the injured leg back, the uninjured leg forward, and the heel of your injured leg on the floor. Turn your injured foot slightly inward (as if you were pigeon-toed) as you slowly lean into the wall until you feel a stretch in the back of your calf. Hold for 15 to 30 seconds. Repeat 3 times. Do this exercise several times each day. When you can stand comfortably on your injured foot, you can begin stretching the bottom of your foot using the plantar fascia stretch.  Plantar fascia stretch: Stand with the ball of your injured foot on a stair. Reach for the bottom step with your heel until  you feel a stretch in the arch of your foot. Hold this position for 15 to 30 seconds and then relax. Repeat 3 times. After you have stretched the bottom muscles of your foot, you can begin strengthening the top muscles of your foot.  Frozen can roll: Roll your bare injured foot back and forth from your heel to your mid-arch over a frozen juice can. Repeat for 3 to 5 minutes. This exercise is particularly helpful if done first thing in the morning. Towel pickup: With your heel on the ground, pick up a towel with your toes. Release. Repeat 10 to 20 times.  When this gets easy, add more resistance by placing a book or small weight on the towel. Static and dynamic balance exercises Place a chair next to your non-injured leg and stand upright. (This will provide you with balance if needed.) Stand on your injured foot. Try to raise the arch of your foot while keeping your toes on the floor. Try to maintain this position and balance on your injured side for 30 seconds. This exercise can be made more difficult by doing it on a piece of foam or a pillow, or with your eyes closed. Stand in the same position as above. Keep your foot in this position and reach forward in front of you with your injured side's hand, allowing your knee to bend. Repeat this 10 times while maintaining the arch height. This exercise can be made more difficult by reaching farther in front of you. Do 2 sets. Stand in the same position as above. While maintaining your arch height, reach the injured side's hand across your body toward the chair. The farther you reach, the more challenging the exercise. Do 2 sets of 10.  Next, you can begin strengthening the muscles of your foot and lower leg by using elastic tubing.  Strengthening: Resisted dorsiflexion: Sit with your injured leg out straight and your foot facing a doorway. Tie a loop in one end of the tubing. Put your foot through the loop so that the tubing goes around the arch of your foot. Tie a knot in the other end of the tubing and shut the knot in the door. Move backward until there is tension in the tubing. Keeping your knee straight, pull your foot toward your body, stretching the tubing. Slowly return to the starting position. Do 3 sets of 10. Resisted plantar flexion: Sit with your leg outstretched and loop the middle section of the tubing around the ball of your foot. Hold the ends of the tubing in both hands. Gently press the ball of your foot down and point your toes, stretching the tubing. Return to the starting position. Do 3  sets of 10. Resisted inversion: Sit with your legs out straight and cross your uninjured leg over your injured ankle. Wrap the tubing around the ball of your injured foot and then loop it around your uninjured foot so that the tubing is anchored there at one end. Hold the other end of the tubing in your hand. Turn your injured foot inward and upward. This will stretch the tubing. Return to the starting position. Do 3 sets of 10. Resisted eversion: Sit with both legs stretched out in front of you, with your feet about a shoulder's width apart. Tie a loop in one end of the tubing. Put your injured foot through the loop so that the tubing goes around the arch of that foot and wraps around the outside of the uninjured foot. Hold  onto the other end of the tubing with your hand to provide tension. Turn your injured foot up and out. Make sure you keep your uninjured foot still so that it will allow the tubing to stretch as you move your injured foot. Return to the starting position. Do 3 sets of 10.

## 2017-04-05 NOTE — Progress Notes (Signed)
Subjective:    Patient ID: Derek Cook, male    DOB: [birth year], 56 y.o.   MRN: [id number]  Derek Cook is a 56 y.o. male presenting on 04/05/2017 for Tick Removal (obtw Right heel pain with ROM onset last year which is getting worst)   HPI   FOLLOW-UP RECURRENT TICK BITES: - Last visit with me was scheduled for 7/27, however he was significantly late to visit and no other open slots, given Friday and going into weekend, agreed to provide Doxycycline prophylaxis due to report of several tick bites and concerns with some symptoms, also patient requested blood testing for lyme, see prior notes for background information. - Interval update with review of lab tests Lyme ab titer and RMSF titers negative, he has history of RMSF, continues to take Doxycycline as prescribed - Today patient reports that he never had any erythema migrans or rash, but had recurrent tick bites most days due to work, unsure how long tick has been on him but he checks daily, he thinks unlikely >72 hours, exposure since June 2018, usually lasts until October - He is concerned about having lower leg muscle aches and asking if this could be related to ticks or if due to his cholesterol medicine, see below - Denies other joint pain, fevers/chills, sweats, n/v  HYPERLIPIDEMIA: - Reports chronic concern with history of elevated cholesterol specifically abnormal TG, and known family history of abnormal cholesterol with high TG, brother and sister both similar, and sister has had MI, brother has high chol despite active and exercise. Last lipid panel 06/2016, elevated TC 245, low HDL 39, and TG 773, unable to calc LDL - Currently taking Livalo (Pitavastatin) 2mg  daily - he was given this by other provider has taken now since 06/2016 and thinks maybe it is causing myalgias, only mild aching and discomfort in lower legs bilateral anterior thighs mostly, he denies any symptoms initially for first >6 months, now more gradual onset  for past >3 months, otherwise he states had much more severe symptoms side effect myalgias from other statins in past - Never on Fibrate for TG. Also not aware of Repatha. Never seen Cardiology or Endocrinology Lifestyle - Diet/Exercise: Limited by night shift and other factors - He has interview sooner for day shift job  Right Heel/Foot Pain - Additional complaint today, with chronic now >1 month R heel and foot pain, has had prior multiple ankle sprains in past. >1 year ago had similar problem both feet but L resolved. He thinks plantar fasciitis  Social History  Substance Use Topics  . Smoking status: Former Smoker    Quit date: 09/07/2009  . Smokeless tobacco: Former Systems developer  . Alcohol use No    Review of Systems Per HPI unless specifically indicated above     Objective:    BP 123/78   Pulse 77   Temp 98.4 F (36.9 C) (Oral)   Resp 16   Ht 5\' 8"  (1.727 m)   Wt 241 lb (109.3 kg)   BMI 36.64 kg/m   Wt Readings from Last 3 Encounters:  04/05/17 241 lb (109.3 kg)  12/29/16 246 lb (111.6 kg)  12/10/16 248 lb (112.5 kg)    Physical Exam  Constitutional: He is oriented to person, place, and time. He appears well-developed and well-nourished. No distress.  Well-appearing, comfortable, cooperative  HENT:  Head: Normocephalic and atraumatic.  Mouth/Throat: Oropharynx is clear and moist.  Eyes: Conjunctivae are normal. Right eye exhibits no discharge. Left eye exhibits  no discharge.  Neck: Normal range of motion. Neck supple.  Cardiovascular: Normal rate, regular rhythm, normal heart sounds and intact distal pulses.   No murmur heard. Pulmonary/Chest: Effort normal.  Musculoskeletal: Normal range of motion. He exhibits no edema.  Localized discomfort bilateral anterior thighs non reproducible  No joint effusion  Right Foot Inspection: normal appearance Palpation: localized tenderness medial heel and arch. Achilles non tender, medial malleoli non tender ROM: full AROM  plantar and dorsi flexion, ankle Strength: intact Neurovascular: intact  Neurological: He is alert and oriented to person, place, and time.  Skin: Skin is warm and dry. No rash (No erythema migrans or rash from tick bites) noted. He is not diaphoretic. No erythema.  Psychiatric: He has a normal mood and affect. His behavior is normal.  Well groomed, good eye contact, normal speech and thoughts  Nursing note and vitals reviewed.    Results for orders placed or performed in visit on 04/02/17  Lyme Ab/Western Blot Reflex  Result Value Ref Range   B burgdorferi Ab IgG+IgM <0.90 <0.90 Index      Assessment & Plan:   Problem List Items Addressed This Visit    Plantar fasciitis of right foot    Clinically consistent with subacute on chronic R plantar fasciitis based on history and exam, localized pain. Likely prolong standing and boots with poor footwear - No known injury. Unlikely fracture based on history, no bony tenderness. - Limited conservative therapy  Plan: 1. Reviewed diagnosis and management of plantar fasciitis 2. May take NSAID, Tylenol 3. May use topical muscle rub, ice 4. Emphasized importance of relative rest, ice, and avoid prolonged standing and overuse 5. Handout given and explained appropriate stretching and home exercises important to do first thing in morning and later 6. Referral to [facility] Podiatry for further eval, may consider X-ray, inj, and possibly custom orthotics      Relevant Orders   Ambulatory referral to Podiatry   Hyperlipidemia - Primary    Uncontrolled cholesterol and hyperTG >700, on statin and poor lifestyle Last lipid panel 06/2016 Elevated ASCVD risk Concern family history of genetic hyperlipidemia/TG Multiple statin intolerance with myalgias. Never on fibrate  Plan: 1. HOLD Livalo (Pitavastatin) for now - 1-2 weeks to see if leg pain symptoms are truly myalgias, if resolved then likely diagnosis, and may try last effort with intermittent  dosing very slowly weekly up to qod eventually if tolerated - otherwise limited other statins available to try, looking at alternative options - such as Repatha (PSK9) likely needed, also reviewed Fibrates but risk myalgia 2. Encourage improved lifestyle - low carb/cholesterol, reduce portion size, start regular exercise with change to day shift 3. Re-check lipids 3-4 months, if not improved and concern with meds still refer to Cards vs Endo for advanced lipid management      Relevant Orders   Lipid panel   COMPLETE METABOLIC PANEL WITH GFR   TSH   Familial hypertriglyceridemia   Relevant Orders   Lipid panel   COMPLETE METABOLIC PANEL WITH GFR    Other Visit Diagnoses    Myalgia due to statin       Abnormal glucose       Relevant Orders   Hemoglobin A1c   Tick bite of right hip, subsequent encounter       Relevant Orders   Lyme Ab/Western Blot Reflex      No orders of the defined types were placed in this encounter.   Follow up plan: Return in about  4 months (around 08/06/2017) for Hyperlipidemia.  [name], [facility] 04/06/2017, 12:26 AM

## 2017-04-06 ENCOUNTER — Encounter: Payer: Self-pay | Admitting: Family Medicine

## 2017-04-06 DIAGNOSIS — E781 Pure hyperglyceridemia: Secondary | ICD-10-CM | POA: Insufficient documentation

## 2017-04-06 NOTE — Assessment & Plan Note (Signed)
Clinically consistent with subacute on chronic R plantar fasciitis based on history and exam, localized pain. Likely prolong standing and boots with poor footwear - No known injury. Unlikely fracture based on history, no bony tenderness. - Limited conservative therapy  Plan: 1. Reviewed diagnosis and management of plantar fasciitis 2. May take NSAID, Tylenol 3. May use topical muscle rub, ice 4. Emphasized importance of relative rest, ice, and avoid prolonged standing and overuse 5. Handout given and explained appropriate stretching and home exercises important to do first thing in morning and later 6. Referral to [facility] Podiatry for further eval, may consider X-ray, inj, and possibly custom orthotics

## 2017-04-06 NOTE — Assessment & Plan Note (Signed)
Uncontrolled cholesterol and hyperTG >700, on statin and poor lifestyle Last lipid panel 06/2016 Elevated ASCVD risk Concern family history of genetic hyperlipidemia/TG Multiple statin intolerance with myalgias. Never on fibrate  Plan: 1. HOLD Livalo (Pitavastatin) for now - 1-2 weeks to see if leg pain symptoms are truly myalgias, if resolved then likely diagnosis, and may try last effort with intermittent dosing very slowly weekly up to qod eventually if tolerated - otherwise limited other statins available to try, looking at alternative options - such as Repatha (PSK9) likely needed, also reviewed Fibrates but risk myalgia 2. Encourage improved lifestyle - low carb/cholesterol, reduce portion size, start regular exercise with change to day shift 3. Re-check lipids 3-4 months, if not improved and concern with meds still refer to Cards vs Endo for advanced lipid management

## 2017-05-04 ENCOUNTER — Ambulatory Visit: Payer: Self-pay | Admitting: Podiatry

## 2017-05-15 ENCOUNTER — Emergency Department: Payer: BLUE CROSS/BLUE SHIELD

## 2017-05-15 ENCOUNTER — Encounter: Payer: Self-pay | Admitting: Emergency Medicine

## 2017-05-15 ENCOUNTER — Emergency Department
Admission: EM | Admit: 2017-05-15 | Discharge: 2017-05-15 | Disposition: A | Discharge status: Home / Self Care | Payer: BLUE CROSS/BLUE SHIELD | Attending: Emergency Medicine | Admitting: Emergency Medicine

## 2017-05-15 DIAGNOSIS — I10 Essential (primary) hypertension: Secondary | ICD-10-CM | POA: Insufficient documentation

## 2017-05-15 DIAGNOSIS — Z7982 Long term (current) use of aspirin: Secondary | ICD-10-CM | POA: Diagnosis not present

## 2017-05-15 DIAGNOSIS — Z87891 Personal history of nicotine dependence: Secondary | ICD-10-CM | POA: Diagnosis not present

## 2017-05-15 DIAGNOSIS — M5441 Lumbago with sciatica, right side: Secondary | ICD-10-CM | POA: Insufficient documentation

## 2017-05-15 DIAGNOSIS — J45909 Unspecified asthma, uncomplicated: Secondary | ICD-10-CM | POA: Insufficient documentation

## 2017-05-15 DIAGNOSIS — M549 Dorsalgia, unspecified: Secondary | ICD-10-CM | POA: Diagnosis present

## 2017-05-15 DIAGNOSIS — Z79899 Other long term (current) drug therapy: Secondary | ICD-10-CM | POA: Insufficient documentation

## 2017-05-15 MED ORDER — PREDNISONE 10 MG PO TABS: ORAL_TABLET | ORAL | 0 refills | Status: DC

## 2017-05-15 MED ORDER — METHOCARBAMOL 500 MG PO TABS
1000.0000 mg | ORAL_TABLET | Freq: Once | ORAL | Status: AC
  Administered 2017-05-15: 1000 mg via ORAL
  Filled 2017-05-15: qty 2

## 2017-05-15 MED ORDER — OXYCODONE-ACETAMINOPHEN 5-325 MG PO TABS
1.0000 | ORAL_TABLET | Freq: Four times a day (QID) | ORAL | 0 refills | Status: DC | PRN
Start: 2017-05-15 — End: 2017-06-21

## 2017-05-15 MED ORDER — METHOCARBAMOL 500 MG PO TABS: ORAL_TABLET | ORAL | 0 refills | Status: DC

## 2017-05-15 MED ORDER — OXYCODONE-ACETAMINOPHEN 5-325 MG PO TABS
1.0000 | ORAL_TABLET | Freq: Once | ORAL | Status: AC
  Administered 2017-05-15: 1 via ORAL
  Filled 2017-05-15: qty 1

## 2017-05-15 NOTE — ED Provider Notes (Signed)
[facility] Emergency Department Provider Note  ____________________________________________   First MD Initiated Contact with Patient 05/15/17 1121     (approximate)  I have reviewed the triage vital signs and the nursing notes.   HISTORY  Chief Complaint Back Pain   HPI Derek Cook is a 57 y.o. male is here with complaint of back pain. Patient states that he pulled his tailgate on his truck down yesterday and lifted something. He states that shortly after that he developed back pain with radiation down his right leg. He states he has had some difficulty with his back with his radiculopathy in his left leg 10 years ago. He has not taken any medication today but took ibuprofen last evening. Patient has continued to be ambulatory. He denies any urinary symptoms, saddle paresthesias or incontinence of bowel or bladder. He rates his pain as a 9/10.   Past Medical History:  Diagnosis Date  . Allergy   . Asthma   . Colon polyp   . GERD (gastroesophageal reflux disease)   . Hyperlipidemia     Patient Active Problem List   Diagnosis Date Noted  . Familial hypertriglyceridemia 04/06/2017  . Plantar fasciitis of right foot 04/05/2017  . Allergic rhinitis due to allergen 09/17/2016  . Dry skin dermatitis 09/09/2016  . Carpal tunnel syndrome, left 06/25/2016  . GERD (gastroesophageal reflux disease) 05/29/2016  . Prediabetes 03/24/2016  . Mild persistent asthma 02/13/2016  . Hyperlipidemia 02/13/2016  . Hypertension 02/13/2016    Past Surgical History:  Procedure Laterality Date  . CLAVICLE EXCISION    . HERNIA REPAIR  2004, 2005  . WRIST ARTHROPLASTY      Prior to Admission medications   Medication Sig Start Date End Date Taking? Authorizing Provider  ADVAIR DISKUS 250-50 MCG/DOSE AEPB Inhale 1 puff into the lungs 2 (two) times daily. Patient not taking: Reported on 04/05/2017 12/08/16   Olin Hauser, DO  aspirin EC 81 MG tablet Take  81 mg by mouth.    [provider]  Elastic Bandages & Supports (MEDICAL COMPRESSION SOCKS) MISC 1 each by Does not apply route daily. 04/16/16   Krebs, Genevie Cheshire, NP  fluticasone (FLONASE) 50 MCG/ACT nasal spray Place 2 sprays into both nostrils daily. Use for 4-6 weeks then stop and use seasonally or as needed. 09/17/16   Karamalegos, Alexander J, DO  LIVALO 2 MG TABS Take 1 tablet (2 mg total) by mouth daily. 12/01/16   Karamalegos, Devonne Doughty, DO  methocarbamol (ROBAXIN) 500 MG tablet Take 1-2 tablets q 6 hours prn muscle spasms 05/15/17   Johnn Hai, PA-C  omeprazole (PRILOSEC) 40 MG capsule Take 1 capsule (40 mg total) by mouth daily. 05/22/16   Krebs, Genevie Cheshire, NP  oxyCODONE-acetaminophen (ROXICET) 5-325 MG tablet Take 1 tablet by mouth every 6 (six) hours as needed for moderate pain. 05/15/17 05/15/18  Johnn Hai, PA-C  predniSONE (DELTASONE) 10 MG tablet Take 6 tablets  today, on day 2 take 5 tablets, day 3 take 4 tablets, day 4 take 3 tablets, day 5 take  2 tablets and 1 tablet the last day 05/15/17   Philomena Course  PROVENTIL HFA 108 [phone] Base) MCG/ACT inhaler  01/20/16   [provider]  theophylline (THEODUR) 300 MG 12 hr tablet Take 300 mg by mouth daily.  01/06/16   [provider]    Allergies Shellfish-derived products and Sulfa antibiotics  Family History  Problem Relation Age of Onset  .  Cancer Mother        malonema  . Diabetes Mother   . Hyperlipidemia Mother   . Dementia Mother   . Cancer Father   . Cancer Sister   . Heart disease Sister   . Diabetes Brother   . Hyperlipidemia Brother   . Dementia Paternal Grandmother     Social History Social History  Substance Use Topics  . Smoking status: Former Smoker    Quit date: 09/07/2009  . Smokeless tobacco: Former Systems developer  . Alcohol use No    Review of Systems Constitutional: No fever/chills Cardiovascular: Denies chest pain. Respiratory: Denies shortness of  breath. Gastrointestinal: No abdominal pain.  No nausea, no vomiting.   Genitourinary: Negative for dysuria. Musculoskeletal: Positive for low back pain with right leg radiculopathy. Skin: Negative for rash. Neurological: Negative for headaches, focal weakness or numbness. ___________________________________________   PHYSICAL EXAM:  VITAL SIGNS: ED Triage Vitals  Enc Vitals Group     BP --      Pulse Rate 05/15/17 1057 72     Resp 05/15/17 1057 16     Temp 05/15/17 1057 98 F (36.7 C)     Temp Source 05/15/17 1057 Oral     SpO2 05/15/17 1057 100 %     Weight 05/15/17 1058 241 lb (109.3 kg)     Height 05/15/17 1058 5\' 8"  (1.727 m)     Head Circumference --      Peak Flow --      Pain Score 05/15/17 1057 9     Pain Loc --      Pain Edu? --      Excl. in [place]? --    Constitutional: Alert and oriented. Well appearing and in no acute distress. Eyes: Conjunctivae are normal.  Head: Atraumatic. Neck: No stridor.   Cardiovascular: Normal rate, regular rhythm. Grossly normal heart sounds.  Good peripheral circulation. Respiratory: Normal respiratory effort.  No retractions. Lungs CTAB. Gastrointestinal: Soft and nontender. No distention.  Musculoskeletal: Examination of the back is no gross deformity however there is moderate tenderness on palpation of the lower lumbar spine and paravertebral muscles to the right. Range of motion is restricted secondary to pain. Straight leg raises approximately 70 with the right leg. Neurologic:  Normal speech and language. No gross focal neurologic deficits are appreciated. Reflexes were 2+ bilaterally.  Skin:  Skin is warm, dry and intact. No rash noted. Psychiatric: Mood and affect are normal. Speech and behavior are normal.  ____________________________________________   LABS (all labs ordered are listed, but only abnormal results are displayed)  Labs Reviewed - No data to display  RADIOLOGY  Dg Lumbar Spine 2-3 Views  Result Date:  05/15/2017 CLINICAL DATA:  Lifting injury yesterday. Pain extending down the right leg into the calf. EXAM: LUMBAR SPINE - 2-3 VIEW COMPARISON:  None. FINDINGS: There is transitional lumbosacral anatomy without prior spine imaging available for comparison. For the purposes of this dictation, the transitional segment will be considered a partially sacralized L5. The ribs at T12 are hypoplastic. Vertebral alignment is normal. Vertebral body heights are preserved without evidence of fracture. At most minimal disc space scratched of there is minimal disc space narrowing and minimal spurring from L2-3 to L4-5. Mild lower thoracic spondylosis is also noted. The soft tissues are unremarkable. IMPRESSION: Mild thoracolumbar spondylosis without evidence of acute osseous abnormality. Electronically Signed   By: Logan Bores M.D.   On: 05/15/2017 13:11    ____________________________________________   PROCEDURES  Procedure(s) performed:  None  Procedures  Critical Care performed: No  ____________________________________________   INITIAL IMPRESSION / ASSESSMENT AND PLAN / ED COURSE  Pertinent labs & imaging results that were available during my care of the patient were reviewed by me and considered in my medical decision making (see chart for details).  Patient was given Robaxin 1 g by mouth along with Percocet 5 mg prior to going to x-ray. He was reassured that x-rays did not show any acute changes. Patient drives a truck for work. He was made aware that he would be getting a prescription for the above medications along with prednisone. He is aware that he cannot drive while taking the Robaxin and Percocet due to drowsiness and increased risk for accidents. Patient was given a note to remain out of work. Patient will follow-up with his PCP if any continued problems for further testing and possible MRI if he continues to have difficulties.   ___________________________________________   FINAL CLINICAL  IMPRESSION(S) / ED DIAGNOSES  Final diagnoses:  Acute right-sided low back pain with right-sided sciatica      NEW MEDICATIONS STARTED DURING THIS VISIT:  Discharge Medication List as of 05/15/2017  1:43 PM    START taking these medications   Details  methocarbamol (ROBAXIN) 500 MG tablet Take 1-2 tablets q 6 hours prn muscle spasms, Print    oxyCODONE-acetaminophen (ROXICET) 5-325 MG tablet Take 1 tablet by mouth every 6 (six) hours as needed for moderate pain., Starting Sat 05/15/2017, Until Sun 05/15/2018, Print    predniSONE (DELTASONE) 10 MG tablet Take 6 tablets  today, on day 2 take 5 tablets, day 3 take 4 tablets, day 4 take 3 tablets, day 5 take  2 tablets and 1 tablet the last day, Print         Note:  This document was prepared using Dragon voice recognition software and may include unintentional dictation errors.    Johnn Hai, PA-C 05/15/17 1416    Hinda Kehr, MD 05/15/17 [phone]

## 2017-05-15 NOTE — ED Notes (Signed)
Pt alert and oriented X4, active, cooperative, pt in NAD. RR even and unlabored, color WNL.  Pt informed to return if any life threatening symptoms occur.   

## 2017-05-15 NOTE — ED Notes (Signed)
Right hip pain that radiates down leg. No known injury . Occurred today.

## 2017-05-15 NOTE — ED Triage Notes (Signed)
Pt to ed with c/o back pain after lifting tail gait on truck yesterday.

## 2017-05-15 NOTE — Discharge Instructions (Signed)
Follow-up with your doctor in [place] if any continued problems. Ice or heat to back as needed for comfort. Continue taking medication as directed. Robaxin as needed for muscle spasms, Percocet as needed for pain, prednisone as directed starting with 6 tablets today and tapering down. Do not drive or operate machinery while taking Robaxin and Percocet.

## 2017-06-18 ENCOUNTER — Ambulatory Visit: Payer: BLUE CROSS/BLUE SHIELD | Admitting: Family Medicine

## 2017-06-21 ENCOUNTER — Ambulatory Visit (INDEPENDENT_AMBULATORY_CARE_PROVIDER_SITE_OTHER): Payer: BLUE CROSS/BLUE SHIELD | Admitting: Family Medicine

## 2017-06-21 ENCOUNTER — Encounter: Payer: Self-pay | Admitting: Family Medicine

## 2017-06-21 VITALS — BP 125/75 | HR 73 | Temp 97.8°F | Resp 16 | Ht 68.0 in | Wt 246.0 lb

## 2017-06-21 DIAGNOSIS — D229 Melanocytic nevi, unspecified: Secondary | ICD-10-CM | POA: Diagnosis not present

## 2017-06-21 DIAGNOSIS — M5441 Lumbago with sciatica, right side: Secondary | ICD-10-CM | POA: Diagnosis not present

## 2017-06-21 DIAGNOSIS — Z23 Encounter for immunization: Secondary | ICD-10-CM | POA: Diagnosis not present

## 2017-06-21 DIAGNOSIS — G8929 Other chronic pain: Secondary | ICD-10-CM | POA: Insufficient documentation

## 2017-06-21 DIAGNOSIS — M545 Low back pain: Secondary | ICD-10-CM

## 2017-06-21 MED ORDER — BACLOFEN 10 MG PO TABS: 5.0000 mg | ORAL_TABLET | Freq: Three times a day (TID) | ORAL | 1 refills | Status: DC | PRN

## 2017-06-21 MED ORDER — MELOXICAM 15 MG PO TABS: 15.0000 mg | ORAL_TABLET | Freq: Every day | ORAL | 1 refills | Status: DC

## 2017-06-21 NOTE — Assessment & Plan Note (Signed)
Several small mostly benign appearing nevi on back mid and upper, one slightly atypical nevus midline in question, without significant change - Family history of skin cancer with mother melanoma  Plan - Recommended that we observe this lesion and if any significant change we can refer to Dermatology sooner, regardless I advised him it would be a good idea when he is ready to make arrangements to see Dermatologist for routine skin cancer screening, total body exam

## 2017-06-21 NOTE — Progress Notes (Signed)
Subjective:    Patient ID: Derek Cook, male    DOB: 05-Apr-1961, 56 y.o.   MRN: [id number]  Derek Cook is a 56 y.o. male presenting on 06/21/2017 for Sciatica (Right side chiropractor didn't help onset 05/2017)   HPI   LOW BACK PAIN, Acute-Subacute Right lower with R sciatica - Reports symptoms started about 4-5 weeks ago with known inciting injury lifting tailgate of truck and poor positioning immediately caused some low back pain flare, he was evaluated in ED at that time and had some Lumbar Spine X-rays done with mild DJD and disc narrowing L2-3 and L4-5, he was treated with prednisone burst taper, oxycodone and robaxin, see note for details - Today seems to be gradually improving but here to follow-up since still problem 4-5 weeks later. Describes pain as intermittent worse with certain positions or prolonged sitting or standing related to work, severity mild to moderate with intermittent worsening. Occasional will have radiating pain down Right leg to knee posteriorly, not to foot - Tried chiropractor locally x 2 visits limited relief, then tried massage therapist locally x 3 treatments with improvement - Tried some home stretching followed youtube via, helped initially but then returned, he stopped since, mostly sedentary recently. Not done PT - Taking Tylenol 500mg  x 1 dose and then Ibuprofen 200mg  x 2 per dose, good relief, but some upset stomach - He has stopped taking Robaxin and Oxycodone given to him by ED in 05/2017, difficulty tolerating these, cannot take as driver for MRI - Known remote history of back pain in 1980s, otherwise no documented problem until recent X-ray confirmed thoracolumbar OA/DJD, no prior back surgery - No difficulty sleeping due to pain - Additionally admits sensation of some popping in low back at times with certain positions - He works for driving truck portable MRI, and may be able to get access to an MRI covered through work - Denies any  fevers/chills, numbness, tingling, weakness, loss of control bladder/bowel incontinence or retention, unintentional wt loss, night sweats  Atypical Nevus / Moles on back: - Additional request today to check a mole on his back, states his wife thinks it has changed but unsure, wanted it checked out. He has several moles, and states his mother had history of skin cancer melanoma - he does not have dermatologist  Health Maintenance: - Due for Flu Shot, will receive today  Family History  Problem Relation Age of Onset  . Cancer Mother        melanoma  . Diabetes Mother   . Hyperlipidemia Mother   . Dementia Mother   . Cancer Father   . Cancer Sister   . Heart disease Sister   . Diabetes Brother   . Hyperlipidemia Brother   . Dementia Paternal Grandmother     Depression screen [facility] 2/9 06/21/2017 04/05/2017  Decreased Interest 0 0  Down, Depressed, Hopeless 0 0  PHQ - 2 Score 0 0    Social History  Substance Use Topics  . Smoking status: Former Smoker    Quit date: 09/07/2009  . Smokeless tobacco: Former Systems developer  . Alcohol use No    Review of Systems Per HPI unless specifically indicated above     Objective:    BP 125/75   Pulse 73   Temp 97.8 F (36.6 C) (Oral)   Resp 16   Ht 5\' 8"  (1.727 m)   Wt 246 lb (111.6 kg)   BMI 37.40 kg/m   Wt Readings from Last 3  Encounters:  06/21/17 246 lb (111.6 kg)  05/15/17 241 lb (109.3 kg)  04/05/17 241 lb (109.3 kg)    Physical Exam  Constitutional: He is oriented to person, place, and time. He appears well-developed and well-nourished. No distress.  Well-appearing, comfortable, cooperative, obese  HENT:  Head: Normocephalic and atraumatic.  Mouth/Throat: Oropharynx is clear and moist.  Eyes: Conjunctivae are normal. Right eye exhibits no discharge. Left eye exhibits no discharge.  Neck: Normal range of motion. Neck supple. No thyromegaly present.  Cardiovascular: Normal rate, regular rhythm, normal heart sounds and intact  distal pulses.   No murmur heard. Pulmonary/Chest: Effort normal and breath sounds normal. No respiratory distress. He has no wheezes. He has no rales.  Musculoskeletal: Normal range of motion. He exhibits no edema.  Low Back Inspection: Normal appearance, Large body habitus, no spinal deformity, symmetrical. Palpation: No tenderness over spinous processes. Bilateral lumbar paraspinal muscles non-tender but with R lower lumbar/paraspinal hypertonicity and spasm ROM: Full active ROM forward flex / back extension, rotation L/R without discomfort Special Testing: Seated SLR negative for radicular pain bilaterally, with some pulling and discomfort on R side without radicular symptoms Strength: Bilateral hip flex/ext 5/5, knee flex/ext 5/5, ankle dorsiflex/plantarflex 5/5 Neurovascular: intact distal sensation to light touch  Lymphadenopathy:    He has no cervical adenopathy.  Neurological: He is alert and oriented to person, place, and time.  Skin: Skin is warm and dry. No rash noted. He is not diaphoretic. No erythema.  Several nevi on back, see pictures, main concern midline back some slight variable color but overall unremarkable.  Psychiatric: He has a normal mood and affect. His behavior is normal.  Well groomed, good eye contact, normal speech and thoughts  Nursing note and vitals reviewed.    Midline mid back, variable appearing nevus    Upper R back, normal appearing nevi       Assessment & Plan:   Problem List Items Addressed This Visit    Acute low back pain - Primary    Acute-Subacute on chronic R LBP with associated R sciatica, seems gradually improving now x 4-5 weeks later - Suspect likely due to muscle spasm/strain, with known lifting injury, 05/15/17 - No significant prior chronic back pain, but known thoracolumbar DJD (last imaging X-ray lumbar 05/15/17 in ED) - No red flag symptoms. Negative SLR for radiculopathy - Inadequate conservative therapy - but has responded to  NSAIDs and relative rest  Plan: - Reviewed diagnosis and course of osteoarthritis / chronic back pain, discontinued prior Oxycodone and Robaxin, intolerance 1. Start anti-inflammatory trial with rx Meloxicam 15mg  daily wc x 2-4 weeks, then PRN 2. Start muscle relaxant with Baclofen 10mg  tabs - take 5-10mg  up to TID PRN, titrate up as tolerated 3. Encouraged regular Tylenol use and also for breakthrough 4. Encouraged use of heating pad 1-2x daily for now then PRN 5. Referral to [facility] PT since problem persistent >4-5 weeks now 6. Follow-up 4 weeks if not improved - he may pursue Lumbar MRI through work if able to get covered, otherwise I would hold off, and we can consider ortho in future if unresolved, advised avoid re-injury      Relevant Medications   meloxicam (MOBIC) 15 MG tablet   baclofen (LIORESAL) 10 MG tablet   Other Relevant Orders   Ambulatory referral to Physical Therapy   Multiple nevi    Several small mostly benign appearing nevi on back mid and upper, one slightly atypical nevus midline in question, without significant  change - Family history of skin cancer with mother melanoma  Plan - Recommended that we observe this lesion and if any significant change we can refer to Dermatology sooner, regardless I advised him it would be a good idea when he is ready to make arrangements to see Dermatologist for routine skin cancer screening, total body exam       Other Visit Diagnoses    Needs flu shot       Relevant Orders   Flu Vaccine QUAD 36+ mos IM (Completed)      Meds ordered this encounter  Medications  . meloxicam (MOBIC) 15 MG tablet    Sig: Take 1 tablet (15 mg total) by mouth daily. For 2-4 weeks then as needed    Dispense:  30 tablet    Refill:  1  . baclofen (LIORESAL) 10 MG tablet    Sig: Take 0.5-1 tablets (5-10 mg total) by mouth 3 (three) times daily as needed for muscle spasms. Prefer night-time only    Dispense:  30 each    Refill:  1    Follow up  plan: Return in about 4 weeks (around 07/19/2017), or if symptoms worsen or fail to improve, for low back pain.  [name], DO [facility] 06/21/2017, 12:55 PM

## 2017-06-21 NOTE — Assessment & Plan Note (Addendum)
Acute-Subacute on chronic R LBP with associated R sciatica, seems gradually improving now x 4-5 weeks later - Suspect likely due to muscle spasm/strain, with known lifting injury, 05/15/17 - No significant prior chronic back pain, but known thoracolumbar DJD (last imaging X-ray lumbar 05/15/17 in ED) - No red flag symptoms. Negative SLR for radiculopathy - Inadequate conservative therapy - but has responded to NSAIDs and relative rest  Plan: - Reviewed diagnosis and course of osteoarthritis / chronic back pain, discontinued prior Oxycodone and Robaxin, intolerance 1. Start anti-inflammatory trial with rx Meloxicam 15mg  daily wc x 2-4 weeks, then PRN 2. Start muscle relaxant with Baclofen 10mg  tabs - take 5-10mg  up to TID PRN, titrate up as tolerated 3. Encouraged regular Tylenol use and also for breakthrough 4. Encouraged use of heating pad 1-2x daily for now then PRN 5. Referral to [facility] PT since problem persistent >4-5 weeks now 6. Follow-up 4 weeks if not improved - he may pursue Lumbar MRI through work if able to get covered, otherwise I would hold off, and we can consider ortho in future if unresolved, advised avoid re-injury

## 2017-06-21 NOTE — Patient Instructions (Addendum)
Thank you for coming to the clinic today.  1.  1. For your Back Pain - I think that this is due to Muscle Spasms or strain. Your Sciatic Nerve can be affected causing some of your radiation and numbness down your legs.  2. Start with anti-inflammatory Meloxicam 15mg  twice daily (24 hrs apart, with food) every day for next 2 to 4 weeks if helping, then can use only as needed  3. Start Baclofen (Lioresal) 10mg  tablets - cut in half for 5mg  at night for muscle relaxant - may make you sedated or sleepy (be careful driving or working on this) if tolerated you can take every 8 hours, half or whole tab  4. May use Tylenol Extra Str 500mg  tabs - may take 1-2 tablets every 6 hours as needed  5. Recommend to start using heating pad on your lower back 1-2x daily for few weeks  Referral to [facility] Physical Therapy  This pain may take weeks to months to fully resolve, but hopefully it will respond to the medicine initially. All back injuries (small or serious) are slow to heal since we use our back muscles every day. Be careful with turning, twisting, lifting, sitting / standing for prolonged periods, and avoid re-injury.  If your symptoms significantly worsen with more pain, or new symptoms with weakness in one or both legs, new or different shooting leg pains, numbness in legs or groin, loss of control or retention of urine or bowel movements, please call back for advice and you may need to go directly to the Emergency Department.  Please schedule a Follow-up Appointment to: Return in about 4 weeks (around 07/19/2017), or if symptoms worsen or fail to improve, for low back pain.  If you have any other questions or concerns, please feel free to call the clinic or send a message through [place]. You may also schedule an earlier appointment if necessary.  Additionally, you may be receiving a survey about your experience at our clinic within a few days to 1 week by e-mail or mail. We value your  feedback.  [name], DO [place]

## 2017-06-22 ENCOUNTER — Other Ambulatory Visit: Payer: Self-pay

## 2017-06-22 MED ORDER — OMEPRAZOLE 40 MG PO CPDR: 40.0000 mg | DELAYED_RELEASE_CAPSULE | Freq: Every day | ORAL | 11 refills | Status: DC

## 2017-07-07 ENCOUNTER — Ambulatory Visit: Payer: BLUE CROSS/BLUE SHIELD | Attending: Family Medicine

## 2017-07-12 ENCOUNTER — Ambulatory Visit: Payer: BLUE CROSS/BLUE SHIELD

## 2017-07-14 ENCOUNTER — Ambulatory Visit: Payer: BLUE CROSS/BLUE SHIELD

## 2017-08-04 ENCOUNTER — Other Ambulatory Visit: Payer: BLUE CROSS/BLUE SHIELD

## 2017-08-04 DIAGNOSIS — W57XXXD Bitten or stung by nonvenomous insect and other nonvenomous arthropods, subsequent encounter: Secondary | ICD-10-CM

## 2017-08-04 DIAGNOSIS — E782 Mixed hyperlipidemia: Secondary | ICD-10-CM

## 2017-08-04 DIAGNOSIS — S70261D Insect bite (nonvenomous), right hip, subsequent encounter: Secondary | ICD-10-CM

## 2017-08-04 DIAGNOSIS — R7309 Other abnormal glucose: Secondary | ICD-10-CM

## 2017-08-04 DIAGNOSIS — E781 Pure hyperglyceridemia: Secondary | ICD-10-CM

## 2017-08-09 ENCOUNTER — Ambulatory Visit: Payer: BLUE CROSS/BLUE SHIELD | Admitting: Family Medicine

## 2017-08-11 ENCOUNTER — Other Ambulatory Visit: Payer: Self-pay | Admitting: Family Medicine

## 2017-08-11 DIAGNOSIS — M5441 Lumbago with sciatica, right side: Secondary | ICD-10-CM

## 2017-08-13 ENCOUNTER — Other Ambulatory Visit: Payer: Self-pay

## 2017-08-13 ENCOUNTER — Ambulatory Visit: Payer: BLUE CROSS/BLUE SHIELD | Admitting: Family Medicine

## 2017-08-13 ENCOUNTER — Encounter: Payer: Self-pay | Admitting: Family Medicine

## 2017-08-13 VITALS — BP 110/72 | HR 110 | Temp 97.9°F | Resp 18 | Ht 68.0 in | Wt 246.0 lb

## 2017-08-13 DIAGNOSIS — J4530 Mild persistent asthma, uncomplicated: Secondary | ICD-10-CM

## 2017-08-13 DIAGNOSIS — R6889 Other general symptoms and signs: Secondary | ICD-10-CM

## 2017-08-13 DIAGNOSIS — J0110 Acute frontal sinusitis, unspecified: Secondary | ICD-10-CM

## 2017-08-13 LAB — POCT INFLUENZA A/B
INFLUENZA B, POC: NEGATIVE
Influenza A, POC: NEGATIVE

## 2017-08-13 MED ORDER — AMOXICILLIN-POT CLAVULANATE 875-125 MG PO TABS: 1.0000 | ORAL_TABLET | Freq: Two times a day (BID) | ORAL | 0 refills | Status: DC

## 2017-08-13 MED ORDER — BENZONATATE 100 MG PO CAPS: 100.0000 mg | ORAL_CAPSULE | Freq: Three times a day (TID) | ORAL | 0 refills | Status: DC | PRN

## 2017-08-13 MED ORDER — PROVENTIL HFA 108 (90 BASE) MCG/ACT IN AERS: 2.0000 | INHALATION_SPRAY | RESPIRATORY_TRACT | 5 refills | Status: DC | PRN

## 2017-08-13 NOTE — Progress Notes (Signed)
Subjective:    Patient ID: Derek Cook, male    DOB: [birth year], 56 y.o.   MRN: [id number]  Derek Cook is a 56 y.o. male presenting on 08/13/2017 for Cough (productive coughing, post nasal drainage, head congestion and fatigue x 3 days )  Patient presents for a same day appointment.  HPI   FLU-LIKE ILLNESS / URI vs SINUSITIS - Reports symptoms started 2-3 days ago with URI sinus nasal drainage and cough, seemed to progress to worsen with chills without fever documented, now worse with 24 hours fatigue, feels "drained", generalized body aches, sinus pressure - He is concerned about flu, despite flu shot, he had recent sick contact exposure, patient coughed in his face - Tried OTC Alka seltzer cold/flu with limited relief - Difficulty sleeping last night due to sinus - Admits mild nausea occasionally, but tolerating PO well - Denies any fevers, vomiting, diarrhea, abdominal pain, productive cough, headache, chest pain dyspnea, wheezing, rash  Health Maintenance: UTD Flu Shot 06/2017  Depression screen [facility] 2/9 06/21/2017 04/05/2017  Decreased Interest 0 0  Down, Depressed, Hopeless 0 0  PHQ - 2 Score 0 0    Social History   Tobacco Use  . Smoking status: Former Smoker    Last attempt to quit: 09/07/2009    Years since quitting: 7.9  . Smokeless tobacco: Former Network engineer Use Topics  . Alcohol use: No  . Drug use: No    Review of Systems Per HPI unless specifically indicated above     Objective:    BP 110/72 (BP Location: Right Arm, Patient Position: Sitting, Cuff Size: Large)   Pulse (!) 110   Temp 97.9 F (36.6 C) (Oral)   Resp 18   Ht 5\' 8"  (1.727 m)   Wt 246 lb (111.6 kg)   SpO2 98%   BMI 37.40 kg/m   Wt Readings from Last 3 Encounters:  08/13/17 246 lb (111.6 kg)  06/21/17 246 lb (111.6 kg)  05/15/17 241 lb (109.3 kg)    Physical Exam  Constitutional: He is oriented to person, place, and time. He appears well-developed and well-nourished. No  distress.  Mildly ill appearing, slightly uncomfortable with sinus pressure, cooperative  HENT:  Head: Normocephalic and atraumatic.  Mouth/Throat: Oropharynx is clear and moist.  Frontal sinuses mild-tender. Nares with some turbinate edema and congestion without purulence. Bilateral TMs not visible due to cerumen, soft, not impacting. Oropharynx generalized erythema and post nasal drainage without focal abnormality without erythema, exudates, edema or asymmetry.  Eyes: Conjunctivae are normal. Right eye exhibits no discharge. Left eye exhibits no discharge.  Neck: Normal range of motion. Neck supple.  Cardiovascular: Regular rhythm, normal heart sounds and intact distal pulses.  No murmur heard. Tachycardic  Pulmonary/Chest: Effort normal and breath sounds normal. No respiratory distress. He has no wheezes. He has no rales.  Good air movement. No focal abnormality. No Coughing.  Musculoskeletal: Normal range of motion. He exhibits no edema.  Stable mild bulky appearance of finger joints, without significant deformity. Normal grip.  Lymphadenopathy:    He has no cervical adenopathy.  Neurological: He is alert and oriented to person, place, and time.  Distal sensation to light touch intact  No resting or intention tremor on exam  Skin: Skin is warm and dry. No rash noted. He is not diaphoretic. No erythema.  Psychiatric: His behavior is normal.  Nursing note and vitals reviewed.  Results for orders placed or performed in visit on 08/13/17  POCT  Influenza A/B  Result Value Ref Range   Influenza A, POC Negative Negative   Influenza B, POC Negative Negative      Assessment & Plan:   Problem List Items Addressed This Visit    None    Visit Diagnoses    Acute non-recurrent frontal sinusitis    -  Primary   Relevant Medications   amoxicillin-clavulanate (AUGMENTIN) 875-125 MG tablet   benzonatate (TESSALON) 100 MG capsule   Flu-like symptoms       Relevant Orders   POCT Influenza  A/B (Completed)      Consistent with acute frontal rhinosinusitis, likely initially viral URI vs allergic rhinitis component with worsening concern for bacterial infection now over past 24 hours with worse sinus pain, chills. Additional concern for possible other viral syndrome, questionable flu- requesting testing - S/p influenza vaccine this season   Plan: 1. Rapid flu test A/B today negative - >3 days, will not empirically order Tamiflu - seems sinus is more likely -  Start Augmentin 875-125mg  PO BID x 10 days (agreed to provide early coverage with upcoming weekend, potential poor weather with snow, may limit his follow-up) - Start Tessalon Perls take 1 capsule up to 3 times a day as needed for cough 2. Continue anti-histamine 3. Supportive care with nasal saline OTC, hydration, warm compresses 4. Return criteria reviewed  No work note tonight, he declines   Meds ordered this encounter  Medications  . amoxicillin-clavulanate (AUGMENTIN) 875-125 MG tablet    Sig: Take 1 tablet by mouth 2 (two) times daily.    Dispense:  20 tablet    Refill:  0  . benzonatate (TESSALON) 100 MG capsule    Sig: Take 1 capsule (100 mg total) by mouth 3 (three) times daily as needed for cough.    Dispense:  30 capsule    Refill:  0   Follow up plan: Return in about 2 weeks (around 08/27/2017), or if symptoms worsen or fail to improve, for sinusitis.  [name], [facility] 08/13/2017, 2:38 PM

## 2017-08-13 NOTE — Patient Instructions (Addendum)
Thank you for coming to the clinic today.  1. It sounds like you have a Sinusitis (Bacterial Infection) - this most likely started as an Upper Respiratory Virus that has settled into an infection. Allergies can also cause this.  - Start Augmentin 1 pill twice daily (breakfast and dinner, with food and plenty of water) for 10 days, complete entire course, do not stop early even if feeling better  - Improve hydration by drinking plenty of clear fluids (water, gatorade) to reduce secretions and thin congestion - Congestion draining down throat can cause irritation. May try warm herbal tea with honey, cough drops  Your flu test was NEGATIVE, this is not 100% though, and you can still have the flu with a negative test, otherwise it could be a different viral syndrome.  - Wash hands and cover cough very well to avoid spread of infection - For symptom control:      - May take Ibuprofen / Advil 400-600mg  every 6-8 hours as needed for fever / muscle aches, and may also take Tylenol 500-1000mg  per dose every 6-8 hours or 3 times a day, can alternate dosing      - Start Tessalon perls one every 8 hours or 3 times a day as needed for cough      - Start OTC Mucinex-DM for cough and congestion for up to 7 days - Improve hydration with plenty of clear fluids  If significant worsening with poor fluid intake, worsening fever, difficulty breathing due to coughing, worsening body aches, weakness, or other more concerning symptoms difficulty breathing you can seek treatment at Emergency Department. Also if improved flu symptoms and then worsening days to week later with concerns for bronchitis, productive cough fever chills again we may need to check for possible pneumonia that can occur after the flu   If you develop persistent fever >101F for at least 3 consecutive days, headaches with sinus pain or pressure or persistent earache, please schedule a follow-up evaluation within next few days to  week.  Follow-up within 1-2 weeks if not improved or worsening   If you have any other questions or concerns, please feel free to call the clinic or send a message through [place]. You may also schedule an earlier appointment if necessary.  Additionally, you may be receiving a survey about your experience at our clinic within a few days to 1 week by e-mail or mail. We value your feedback.  [name], DO [place]

## 2017-09-09 ENCOUNTER — Ambulatory Visit: Payer: BLUE CROSS/BLUE SHIELD | Admitting: Family Medicine

## 2017-09-09 ENCOUNTER — Other Ambulatory Visit: Payer: Self-pay | Admitting: Family Medicine

## 2017-09-09 ENCOUNTER — Encounter: Payer: Self-pay | Admitting: Family Medicine

## 2017-09-09 VITALS — BP 135/83 | HR 79 | Temp 98.4°F | Resp 16 | Ht 68.0 in | Wt 244.6 lb

## 2017-09-09 DIAGNOSIS — B353 Tinea pedis: Secondary | ICD-10-CM | POA: Diagnosis not present

## 2017-09-09 DIAGNOSIS — K219 Gastro-esophageal reflux disease without esophagitis: Secondary | ICD-10-CM

## 2017-09-09 DIAGNOSIS — M5442 Lumbago with sciatica, left side: Secondary | ICD-10-CM

## 2017-09-09 DIAGNOSIS — Z125 Encounter for screening for malignant neoplasm of prostate: Secondary | ICD-10-CM

## 2017-09-09 DIAGNOSIS — E781 Pure hyperglyceridemia: Secondary | ICD-10-CM

## 2017-09-09 DIAGNOSIS — I10 Essential (primary) hypertension: Secondary | ICD-10-CM

## 2017-09-09 DIAGNOSIS — E782 Mixed hyperlipidemia: Secondary | ICD-10-CM

## 2017-09-09 DIAGNOSIS — Z0000 Encounter for general adult medical examination without abnormal findings: Secondary | ICD-10-CM

## 2017-09-09 DIAGNOSIS — R7303 Prediabetes: Secondary | ICD-10-CM

## 2017-09-09 MED ORDER — CLOTRIMAZOLE-BETAMETHASONE 1-0.05 % EX CREA: TOPICAL_CREAM | CUTANEOUS | 1 refills | Status: DC

## 2017-09-09 MED ORDER — MELOXICAM 15 MG PO TABS: ORAL_TABLET | ORAL | 2 refills | Status: DC

## 2017-09-09 MED ORDER — BACLOFEN 10 MG PO TABS: ORAL_TABLET | ORAL | 2 refills | Status: DC

## 2017-09-09 NOTE — Patient Instructions (Addendum)
Thank you for coming to the office today.  1. For your Back Pain - I think that this is due to Muscle Spasms or strain. Your Sciatic Nerve can be affected causing some of your radiation and numbness down your legs. - LEFT  2. Refilled anti-inflammatory Meloxicam 15mg  twice daily (24 hrs apart, with food) every day for next 2 to 4 weeks if helping, then can use only as needed  3. Refilled Baclofen (Lioresal) 10mg  tablets - cut in half for 5mg  at night for muscle relaxant - may make you sedated or sleepy (be careful driving or working on this) if tolerated you can take every 8 hours, half or whole tab  4. May use Tylenol Extra Str 500mg  tabs - may take 1-2 tablets every 6 hours as needed - Increase Tylenol and STOP Ibuprofen  5. Recommend to start using heating pad on your lower back 1-2x daily for few weeks  Consider referral to [facility] Physical Therapy   Refilled Clotrimazole-Betamethasone cream as needed for dry skin on toes, and for possible fungal infection athlete's foot  This pain may take weeks to months to fully resolve, but hopefully it will respond to the medicine initially. All back injuries (small or serious) are slow to heal since we use our back muscles every day. Be careful with turning, twisting, lifting, sitting / standing for prolonged periods, and avoid re-injury.  If your symptoms significantly worsen with more pain, or new symptoms with weakness in one or both legs, new or different shooting leg pains, numbness in legs or groin, loss of control or retention of urine or bowel movements, please call back for advice and you may need to go directly to the Emergency Department.  Please schedule a Follow-up Appointment to: Return in about 3 months (around 12/08/2017) for Annual Physical.   If you have any other questions or concerns, please feel free to call the office or send a message through [place]. You may also schedule an earlier appointment if necessary.  Additionally,  you may be receiving a survey about your experience at our office within a few days to 1 week by e-mail or mail. We value your feedback.  [name], DO [place]

## 2017-09-09 NOTE — Progress Notes (Signed)
Subjective:    Patient ID: Derek Cook, male    DOB: 1961/05/28, 57 y.o.   MRN: [id number]  Derek Cook is a 57 y.o. male presenting on 09/09/2017 for Sciatica (left side onset 10 days)   HPI   LOW BACK PAIN, Acute Left lower back with L sciatica - Last visit with me for similar problem 06/21/17 for RIGHT low back, treated with meloxicam, baclofen, see prior notes for background information. - Interval update with he did not schedule with [facility] PT since his back pain improved in interval - Today patient reports now he has same problem but new flare up with bending injury, except pain and sciatica on LEFT now not right. About 10 days ago, he says he bent forward and leaned forward in shower to wash legs and feet, he felt a sharp sensation immediately and possibly a pop in left low back and started walking out of shower - Today seems to be gradually improving but not resolved after 10 days. Describes pain as intermittent worse with certain positions or prolonged sitting or standing related to work, severity mild to moderate with intermittent worsening. 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 2/9 06/21/2017 04/05/2017    Decreased Interest 0 0  Down, Depressed, Hopeless 0 0  PHQ - 2 Score 0 0    Social History   Tobacco Use  . Smoking status: Former Smoker    Last attempt to quit: 09/07/2009    Years since quitting: 8.0  . Smokeless tobacco: Former Network engineer Use Topics  . Alcohol use: No  . Drug use: No    Review of Systems Per HPI unless specifically indicated above     Objective:    BP 135/83   Pulse 79   Temp 98.4 F (36.9 C) (Oral)   Resp 16   Ht 5\' 8"  (1.727 m)   Wt 244 lb 9.6 oz (110.9 kg)   BMI 37.19 kg/m   Wt Readings from Last 3 Encounters:  09/09/17 244 lb 9.6 oz (110.9 kg)  08/13/17 246 lb (111.6 kg)  06/21/17 246 lb (111.6 kg)    Physical Exam  Constitutional: He is oriented to person, place, and time. He appears well-developed and well-nourished. No distress.  Well-appearing, comfortable, cooperative, obese  HENT:  Head: Normocephalic and atraumatic.  Mouth/Throat: Oropharynx is clear and moist.  Eyes: Conjunctivae are normal. Right eye exhibits no discharge. Left eye exhibits no discharge.  Neck: Normal range of motion. Neck supple. No thyromegaly present.  Cardiovascular: Normal rate, regular rhythm, normal heart sounds and intact distal pulses.  No murmur heard. Pulmonary/Chest: Effort normal and breath sounds normal. No respiratory distress. He has no wheezes. He has no rales.  Musculoskeletal: Normal range of motion. He exhibits no edema.  Low Back Inspection: Normal appearance, Large body habitus, no spinal deformity, symmetrical. Palpation: No tenderness over spinous processes. Bilateral lumbar paraspinal muscles non-tender but with L  lower lumbar/paraspinal hypertonicity and spasm ROM: Slightly reduced active ROM forward flex / back extension, rotation L/R with some reproduced L low back discomfort Special Testing: Seated SLR negative for radicular pain bilaterally Strength: Bilateral hip flex/ext 5/5, knee flex/ext 5/5, ankle dorsiflex/plantarflex  5/5 Neurovascular: intact distal sensation to light touch  Lymphadenopathy:    He has no cervical adenopathy.  Neurological: He is alert and oriented to person, place, and time.  Skin: Skin is warm and dry. No rash noted. He is not diaphoretic. No erythema.  Several nevi on back, see pictures, main concern midline back some slight variable color but overall unremarkable.  Psychiatric: He has a normal mood and affect. His behavior is normal.  Well groomed, good eye contact, normal speech and thoughts  Nursing note and vitals reviewed.      Assessment & Plan:   Problem List Items Addressed This Visit    Acute low back pain - Primary  Acute-Subacute on chronic LEFT not R LBP with associated LEFT sciatica, seems gradually improving but not resolved >10 days - Suspect likely due to muscle spasm/strain, with known bending injury - No significant prior chronic back pain, but known thoracolumbar DJD (last imaging X-ray lumbar 05/15/17 in ED) - No red flag symptoms. Negative SLR for radiculopathy - Improved on meds  Plan: 1. Refilled anti-inflammatory trial with rx Meloxicam 15mg  daily wc x 2-4 weeks, then PRN - STOP ibuprofen 2. Refilled muscle relaxant with Baclofen 10mg  tabs - take 5-10mg  up to TID PRN, titrate up as tolerated 3. Encouraged regular Tylenol use and also for breakthrough - INCREASE dose tylenol 4. Encouraged use of heating pad 1-2x daily for now then PRN 5. Reconsider referral to [facility] PT if not improving / also can go to massage therapist 6. Follow-up 3 months annual phys + labs / otherwise sooner 4-6 week if back not improved, may need Ortho referral consider Lumbar MRI through his work    Relevant Medications   meloxicam (MOBIC) 15 MG tablet   baclofen (LIORESAL) 10 MG tablet    Other Visit Diagnoses    Tinea pedis of both feet     Chronic recurrent dry cracking skin, requesting refill one tube lasts >1 year    Relevant Medications   clotrimazole-betamethasone  (LOTRISONE) cream      Meds ordered this encounter  Medications  . clotrimazole-betamethasone (LOTRISONE) cream    Sig: Apply 1-2 times a day for worsening flare dry skin dermatitis of toes/feet, may re-use daily up to 1 week as needed.    Dispense:  30 g    Refill:  1  . meloxicam (MOBIC) 15 MG tablet    Sig: 1 TABLET BY MOUTH ONCE DAILY    Dispense:  30 tablet    Refill:  2  . baclofen (LIORESAL) 10 MG tablet    Sig: TAKE 1/2-1 TABLET BY MOUTH 3 TIMES DAILYAS NEEDED MUSCLE SPASMS PREFER NIGHT TIME    Dispense:  30 each    Refill:  2    Follow up plan: Return in about 3 months (around 12/08/2017) for Annual Physical.  Future labs ordered for 11/2017  [name], [facility] 09/09/2017, 5:40 PM

## 2017-09-20 ENCOUNTER — Telehealth: Payer: Self-pay | Admitting: Family Medicine

## 2017-09-20 DIAGNOSIS — M5441 Lumbago with sciatica, right side: Secondary | ICD-10-CM

## 2017-09-20 MED ORDER — PREDNISONE 20 MG PO TABS: ORAL_TABLET | ORAL | 0 refills | Status: DC

## 2017-09-20 NOTE — Telephone Encounter (Signed)
Pt said the last time he was in for back pain he was told if it wasn't better he could try prednisone.  He is still having pain [phone]

## 2017-09-20 NOTE — Telephone Encounter (Signed)
Last office visit 09/09/17. We did discuss possibility of adding prednisone to his treatment plan if not improved back pain flare. ------------------  Please notify patient that rx Prednisone taper was sent to his pharmacy [facility]. Also remind him that while taking Prednisone he should NOT take Meloxicam, Ibuprofen, Aleve or similar anti-inflammatory meds. He may continue to take Tylenol. After finished with prednisone, he may resume this anti-inflammatory med.  If still not improving after 2 more weeks, he may follow-up sooner or notify us if requesting referral to Orthopedics.  [name], [facility] 09/20/2017, 2:21 PM

## 2017-09-20 NOTE — Telephone Encounter (Signed)
Pt advised.

## 2017-09-23 ENCOUNTER — Telehealth: Payer: Self-pay

## 2017-09-23 DIAGNOSIS — G8929 Other chronic pain: Secondary | ICD-10-CM

## 2017-09-23 DIAGNOSIS — M5441 Lumbago with sciatica, right side: Principal | ICD-10-CM

## 2017-09-23 DIAGNOSIS — M4726 Other spondylosis with radiculopathy, lumbar region: Secondary | ICD-10-CM

## 2017-09-23 DIAGNOSIS — M47816 Spondylosis without myelopathy or radiculopathy, lumbar region: Secondary | ICD-10-CM

## 2017-09-23 NOTE — Telephone Encounter (Signed)
Patient called and wants a referral to [facility] with Dr. Kary Kos for ongoing back pain.

## 2017-09-23 NOTE — Telephone Encounter (Signed)
This is consistent with our last discussion on 09/09/17. Since he has continued to follow-up without significant improvement. Failed conservative therapy.  Please fax last office visit note 09/09/17 and x-ray imaging from 05/2017  [facility] [address] [place],  [address] Phone: [phone]  Fax [phone]  [name], [facility] 09/23/2017, 5:21 PM

## 2017-09-24 NOTE — Telephone Encounter (Signed)
Paperwork were faxed on 09/24/2017.

## 2017-09-30 ENCOUNTER — Telehealth: Payer: Self-pay | Admitting: Family Medicine

## 2017-09-30 DIAGNOSIS — M5441 Lumbago with sciatica, right side: Principal | ICD-10-CM

## 2017-09-30 DIAGNOSIS — G8929 Other chronic pain: Secondary | ICD-10-CM

## 2017-09-30 MED ORDER — TRAMADOL HCL 50 MG PO TABS: 50.0000 mg | ORAL_TABLET | Freq: Four times a day (QID) | ORAL | 0 refills | Status: DC | PRN

## 2017-09-30 MED ORDER — CYCLOBENZAPRINE HCL 10 MG PO TABS: 10.0000 mg | ORAL_TABLET | Freq: Three times a day (TID) | ORAL | 0 refills | Status: DC | PRN

## 2017-09-30 NOTE — Telephone Encounter (Signed)
Please notify patient that I can authorize the rx for Tramadol for a one time short-term refill for now but I do not plan to prescribe it long term. He has been referred to the Neurosurgery as requested recently. He should follow-up with them for further management.  He should stop Baclofen, and switch to the new Flexeril, rx sent.  Both rx should be available at pharmacy sent e-script, if problem can notify us again.  [name], [facility] 09/30/2017, 2:17 PM

## 2017-09-30 NOTE — Telephone Encounter (Signed)
Patient advised as per Dr K. 

## 2017-09-30 NOTE — Telephone Encounter (Signed)
Pt asked if he could have tramadol and flexeril sent to [place] [phone]

## 2017-10-20 ENCOUNTER — Encounter: Payer: Self-pay | Admitting: Family Medicine

## 2017-10-20 ENCOUNTER — Ambulatory Visit: Payer: PRIVATE HEALTH INSURANCE | Admitting: Family Medicine

## 2017-10-20 VITALS — BP 146/83 | HR 87 | Temp 98.0°F | Resp 16 | Ht 68.0 in | Wt 246.6 lb

## 2017-10-20 DIAGNOSIS — R6883 Chills (without fever): Secondary | ICD-10-CM

## 2017-10-20 DIAGNOSIS — J0110 Acute frontal sinusitis, unspecified: Secondary | ICD-10-CM

## 2017-10-20 DIAGNOSIS — J029 Acute pharyngitis, unspecified: Secondary | ICD-10-CM

## 2017-10-20 LAB — POCT INFLUENZA A/B
INFLUENZA A, POC: NEGATIVE
Influenza B, POC: NEGATIVE

## 2017-10-20 LAB — POCT RAPID STREP A (OFFICE): RAPID STREP A SCREEN: NEGATIVE

## 2017-10-20 MED ORDER — AZITHROMYCIN 250 MG PO TABS: ORAL_TABLET | ORAL | 0 refills | Status: DC

## 2017-10-20 MED ORDER — IPRATROPIUM BROMIDE 0.06 % NA SOLN: 2.0000 | Freq: Four times a day (QID) | NASAL | 0 refills | Status: DC

## 2017-10-20 NOTE — Progress Notes (Signed)
Subjective:    Patient ID: Derek Cook, male    DOB: 02/01/1961, 57 y.o.   MRN: [id number]  Derek Cook is a 57 y.o. male presenting on 10/20/2017 for chest congestion (runny nose, sore throat, chills, don't knoe if he has fever onset 8 days)  Patient presents for a same day appointment.  HPI   ACUTE SINUSITIS Reports symptoms started 7-8 days ago with a frontal sinus pressure and congestion and sore throat worsening in evening, then improve in morning and by afternoon, and then worsening again in evening and sore throat again, feels like "swallowing glass", he has a dry mouth often. Since 09/07/17 he has abstained from soda, green tea, and coffee, and he has been focusing only on improving water intake, also no chocolate. - No known sick contacts - Taking generic VIcks OTC liquid 2-3 time daily Admits mild short of breath, could not take deep breath, did not seem similar to asthma Admits some productive cough with yellow sputum, and did have some bleeding in nose recently when blew it Admits some temperature changes felt warm, with sweat then resolve, not measured fever Denies abdominal pain, nausea vomiting, diarrhea, body aches / muscle aches  UPDATE Back Pain Past 4-5 days he has not had problems with back, he changed truck has new seat in it now not aggravating his back. Doing better, still waiting to hear back from Neurosurgery from [place], he called for MRI.  Health Maintenance: UTD Flu Shot 06/21/17  Depression screen [facility] 2/9 06/21/2017 04/05/2017  Decreased Interest 0 0  Down, Depressed, Hopeless 0 0  PHQ - 2 Score 0 0    Social History   Tobacco Use  . Smoking status: Former Smoker    Last attempt to quit: 09/07/2009    Years since quitting: 8.1  . Smokeless tobacco: Former Network engineer Use Topics  . Alcohol use: No  . Drug use: No    Review of Systems Per HPI unless specifically indicated above     Objective:    BP (!) 146/83   Pulse 87   Temp 98 F (36.7  C) (Oral)   Resp 16   Ht 5\' 8"  (1.727 m)   Wt 246 lb 9.6 oz (111.9 kg)   SpO2 98%   BMI 37.50 kg/m   Wt Readings from Last 3 Encounters:  10/20/17 246 lb 9.6 oz (111.9 kg)  09/09/17 244 lb 9.6 oz (110.9 kg)  08/13/17 246 lb (111.6 kg)    Physical Exam  Constitutional: He is oriented to person, place, and time. He appears well-developed and well-nourished. No distress.  Mildly ill appearing and tired but still mostly well, comfortable, cooperative  HENT:  Head: Normocephalic and atraumatic.  Mouth/Throat: Oropharynx is clear and moist.  Frontal sinuses mild tender. Nares with some turbinate edema without purulence. R TM obscured by cerumen and L TM with normal appearance mild cerumen, bilateral hearing aids removed to view. Oropharynx mild posterior pharyngeal drainage with non specific erythema, non focal, no exudates, edema or asymmetry.  Eyes: Conjunctivae are normal. Right eye exhibits no discharge. Left eye exhibits no discharge.  Neck: Normal range of motion. Neck supple.  Mild tender bilateral general anterior cervical w/o significant LAD  Cardiovascular: Normal rate, regular rhythm, normal heart sounds and intact distal pulses.  No murmur heard. Pulmonary/Chest: Effort normal. No respiratory distress. He has no wheezes. He has no rales.  Lower breath sounds with some mild coarse sounds some reduced air movement at bases,  without focal wheezing, or asymmetry. Speaks full sentences. No coughing spells  Musculoskeletal: Normal range of motion. He exhibits no edema.  Lymphadenopathy:    He has no cervical adenopathy.  Neurological: He is alert and oriented to person, place, and time.  Skin: Skin is warm and dry. No rash noted. He is not diaphoretic. No erythema.  Psychiatric: His behavior is normal.  Well groomed, good eye contact, normal speech and thoughts  Nursing note and vitals reviewed.  Results for orders placed or performed in visit on 10/20/17  POCT rapid strep A    Result Value Ref Range   Rapid Strep A Screen Negative Negative  POCT Influenza A/B  Result Value Ref Range   Influenza A, POC Negative Negative   Influenza B, POC Negative Negative      Assessment & Plan:   Problem List Items Addressed This Visit    None    Visit Diagnoses    Acute non-recurrent frontal sinusitis    -  Primary   Relevant Medications   azithromycin (ZITHROMAX Z-PAK) 250 MG tablet   ipratropium (ATROVENT) 0.06 % nasal spray   Sore throat       Relevant Orders   POCT rapid strep A (Completed)   Chills (without fever)       Relevant Orders   POCT Influenza A/B (Completed)      Consistent with acute frontal sinusitis, likely initially viral URI vs allergic rhinitis component with worsening concern for bacterial infection >1 week now with some temperature instability still sinus pain pressure Not consistent with Influenza and had negative rapid test Negative rapid strep  Plan: 1. Start Azithromycin Z-pak - considered augmentin patient preference for Z-pak - will cover for empiric bronchitis as well, in setting of asthma w/o acute flare today -Start Atrovent nasal spray decongestant 2 sprays in each nostril up to 4 times daily for 7 days - Hold flonase for now may use in future if need for maintenance - Mucinex OTC to clear congestion - Offered tessalon but these did not help before, declined - Continue supportive care - Note for work Return criteria reviewed   Meds ordered this encounter  Medications  . azithromycin (ZITHROMAX Z-PAK) 250 MG tablet    Sig: Take 2 tabs (500mg  total) on Day 1. Take 1 tab (250mg ) daily for next 4 days.    Dispense:  6 tablet    Refill:  0  . ipratropium (ATROVENT) 0.06 % nasal spray    Sig: Place 2 sprays into both nostrils 4 (four) times daily. For up to 5-7 days then stop.    Dispense:  15 mL    Refill:  0    Follow up plan: Return if symptoms worsen or fail to improve, for sinusitis.  Already scheduled for Annual  in March 2019 with labs, if he is not feeling well still or has apt for back close to that time he may re-schedule physical push back further into March.  [name], [facility] 10/20/2017, 2:39 PM

## 2017-10-20 NOTE — Patient Instructions (Addendum)
Thank you for coming to the office today.  1. It sounds like you have a Sinusitis (Bacterial Infection) - this most likely started as an Upper Respiratory Virus that has settled into an infection. Allergies can also cause this.  FLU and STREP tests were NEGATIVE TODAY  Start Azithromycin antibiotic for 5 days, finish course - this covers for sinus and bronchitis  Start Atrovent nasal spray decongestant 2 sprays in each nostril up to 4 times daily for 7 days  If need can resume Flonase in future after feeling better to control sinuses and allergies in future  - Improve hydration by drinking plenty of clear fluids (water, gatorade) to reduce secretions and thin congestion  - Congestion draining down throat can cause irritation. May try warm herbal tea with honey, cough drops  - Can take Tylenol or Ibuprofen as needed for fevers  If you develop persistent fever >101F for at least 3 consecutive days, headaches with sinus pain or pressure or persistent earache, please schedule a follow-up evaluation within next few days to week.  Please schedule a Follow-up Appointment to: Return if symptoms worsen or fail to improve, for sinusitis.  If you have any other questions or concerns, please feel free to call the office or send a message through [facility]. You may also schedule an earlier appointment if necessary.  Additionally, you may be receiving a survey about your experience at our office within a few days to 1 week by e-mail or mail. We value your feedback.  [name], DO [place]

## 2017-10-25 ENCOUNTER — Telehealth: Payer: Self-pay | Admitting: Family Medicine

## 2017-10-25 DIAGNOSIS — J0100 Acute maxillary sinusitis, unspecified: Secondary | ICD-10-CM

## 2017-10-25 MED ORDER — AMOXICILLIN-POT CLAVULANATE 875-125 MG PO TABS: 1.0000 | ORAL_TABLET | Freq: Two times a day (BID) | ORAL | 0 refills | Status: DC

## 2017-10-25 NOTE — Telephone Encounter (Signed)
Pt advised.

## 2017-10-25 NOTE — Telephone Encounter (Signed)
Received after hours phone call from nurse triage, patient had mild hive allergic reaction to azithromycin recently rx. He requested alternative antibiotics, stopped taking azithromycin and reaction stopped. I sent in Augmentin x 7 days  Please notify patient that new antibiotic sent to [facility] today.  [name], [facility] 10/25/2017, 8:37 AM

## 2017-10-26 ENCOUNTER — Telehealth: Payer: Self-pay | Admitting: Family Medicine

## 2017-10-26 DIAGNOSIS — R112 Nausea with vomiting, unspecified: Secondary | ICD-10-CM

## 2017-10-26 MED ORDER — ONDANSETRON 4 MG PO TBDP: 4.0000 mg | ORAL_TABLET | Freq: Three times a day (TID) | ORAL | 0 refills | Status: DC | PRN

## 2017-10-26 NOTE — Telephone Encounter (Signed)
Pt advised and want Rx for Zofran.

## 2017-10-26 NOTE — Telephone Encounter (Signed)
It sounds like his symptoms may have been either due to the illness or due to a GI side effect on Augmentin.  Augmentin can cause a possible side effect of upset stomach and diarrhea, but less likely to cause nausea.  He may try the medicine once more to see if same reaction or if it is better tolerated a 2nd time.  I would not recommend switching to a 3rd antibiotic.  It is possible that he has a Viral Syndrome and perhaps it will need to run it's course.  He should focus on improving hydration.  If he wants we can send a Zofran rx for nausea and vomiting to take as needed.  If he is still worsening he may consider follow-up or go to hospital ED if he cannot keep any food/fluids down and feels dehydrated.  [name], [facility] 10/26/2017, 4:34 PM

## 2017-10-26 NOTE — Telephone Encounter (Signed)
Spoke to patient felt hot don't know if he had fever had Diarrhea and throwing up yesterday and happened after taking amoxicillin can he has flu, advised him not to take another dose of amoxicillin. Please suggest ?

## 2017-10-26 NOTE — Telephone Encounter (Signed)
Rx sent zofran [facility]  [name], DO [facility] 10/26/2017, 4:59 PM

## 2017-10-26 NOTE — Addendum Note (Signed)
Addended by: Olin Hauser on: 10/26/2017 04:59 PM   Modules accepted: Orders

## 2017-10-26 NOTE — Telephone Encounter (Signed)
Shortly after taking amoxicillian pt started throwing up and having diarrhea [phone]

## 2017-11-08 ENCOUNTER — Other Ambulatory Visit: Payer: PRIVATE HEALTH INSURANCE

## 2017-11-08 DIAGNOSIS — I10 Essential (primary) hypertension: Secondary | ICD-10-CM

## 2017-11-08 DIAGNOSIS — Z125 Encounter for screening for malignant neoplasm of prostate: Secondary | ICD-10-CM

## 2017-11-08 DIAGNOSIS — E782 Mixed hyperlipidemia: Secondary | ICD-10-CM

## 2017-11-08 DIAGNOSIS — E781 Pure hyperglyceridemia: Secondary | ICD-10-CM

## 2017-11-08 DIAGNOSIS — Z0000 Encounter for general adult medical examination without abnormal findings: Secondary | ICD-10-CM

## 2017-11-08 DIAGNOSIS — R7303 Prediabetes: Secondary | ICD-10-CM

## 2017-11-09 LAB — COMPLETE METABOLIC PANEL WITH GFR
AG Ratio: 1.5 (calc) (ref 1.0–2.5)
ALBUMIN MSPROF: 4 g/dL (ref 3.6–5.1)
ALKALINE PHOSPHATASE (APISO): 92 U/L (ref 40–115)
ALT: 43 U/L (ref 9–46)
AST: 24 U/L (ref 10–35)
BILIRUBIN TOTAL: 0.5 mg/dL (ref 0.2–1.2)
BUN: 14 mg/dL (ref 7–25)
CO2: 27 mmol/L (ref 20–32)
Calcium: 9.4 mg/dL (ref 8.6–10.3)
Chloride: 104 mmol/L (ref 98–110)
Creat: 1.07 mg/dL (ref 0.70–1.33)
GFR, Est African American: 91 mL/min/{1.73_m2} (ref >=60)
GFR, Est Non African American: 78 mL/min/{1.73_m2} (ref >=60)
GLOBULIN: 2.6 g/dL (ref 1.9–3.7)
Glucose, Bld: 108 mg/dL — ABNORMAL HIGH (ref 65–99)
Potassium: 4.4 mmol/L (ref 3.5–5.3)
SODIUM: 139 mmol/L (ref 135–146)
Total Protein: 6.6 g/dL (ref 6.1–8.1)

## 2017-11-09 LAB — HEMOGLOBIN A1C
EAG (MMOL/L): 6.8 (calc)
HEMOGLOBIN A1C: 5.9 %{Hb} — AB (ref <5.7)
Mean Plasma Glucose: 123 (calc)

## 2017-11-09 LAB — PSA, TOTAL WITH REFLEX TO PSA, FREE: PSA, Total: 0.6 ng/mL (ref <=4.0)

## 2017-11-09 LAB — TESTOSTERONE: Testosterone: 194 ng/dL — ABNORMAL LOW (ref 250–827)

## 2017-11-09 LAB — CBC WITH DIFFERENTIAL/PLATELET
BASOS ABS: 112 {cells}/uL (ref 0–200)
Basophils Relative: 1.2 %
EOS ABS: 288 {cells}/uL (ref 15–500)
Eosinophils Relative: 3.1 %
HEMATOCRIT: 43.3 % (ref 38.5–50.0)
Hemoglobin: 14.8 g/dL (ref 13.2–17.1)
LYMPHS ABS: 2678 {cells}/uL (ref 850–3900)
MCH: 27.7 pg (ref 27.0–33.0)
MCHC: 34.2 g/dL (ref 32.0–36.0)
MCV: 81.1 fL (ref 80.0–100.0)
MPV: 9.8 fL (ref 7.5–12.5)
Monocytes Relative: 8 %
NEUTROS ABS: 5478 {cells}/uL (ref 1500–7800)
Neutrophils Relative %: 58.9 %
Platelets: 333 10*3/uL (ref 140–400)
RBC: 5.34 10*6/uL (ref 4.20–5.80)
RDW: 13.9 % (ref 11.0–15.0)
Total Lymphocyte: 28.8 %
WBC: 9.3 10*3/uL (ref 3.8–10.8)
WBCMIX: 744 {cells}/uL (ref 200–950)

## 2017-11-09 LAB — LIPID PANEL
CHOLESTEROL: 215 mg/dL — AB (ref <200)
HDL: 39 mg/dL — ABNORMAL LOW (ref >40)
Non-HDL Cholesterol (Calc): 176 mg/dL (calc) — ABNORMAL HIGH (ref <130)
Total CHOL/HDL Ratio: 5.5 (calc) — ABNORMAL HIGH (ref <5.0)
Triglycerides: 651 mg/dL — ABNORMAL HIGH (ref <150)

## 2017-11-11 ENCOUNTER — Encounter: Payer: Self-pay | Admitting: Family Medicine

## 2017-11-24 ENCOUNTER — Encounter: Payer: Self-pay | Admitting: Family Medicine

## 2017-11-24 ENCOUNTER — Ambulatory Visit (INDEPENDENT_AMBULATORY_CARE_PROVIDER_SITE_OTHER): Payer: PRIVATE HEALTH INSURANCE | Admitting: Family Medicine

## 2017-11-24 VITALS — BP 131/85 | HR 77 | Temp 97.9°F | Resp 16 | Ht 68.0 in | Wt 241.0 lb

## 2017-11-24 DIAGNOSIS — E781 Pure hyperglyceridemia: Secondary | ICD-10-CM

## 2017-11-24 DIAGNOSIS — R7303 Prediabetes: Secondary | ICD-10-CM

## 2017-11-24 DIAGNOSIS — Z0001 Encounter for general adult medical examination with abnormal findings: Secondary | ICD-10-CM | POA: Diagnosis not present

## 2017-11-24 DIAGNOSIS — E7801 Familial hypercholesterolemia: Secondary | ICD-10-CM

## 2017-11-24 DIAGNOSIS — I10 Essential (primary) hypertension: Secondary | ICD-10-CM | POA: Diagnosis not present

## 2017-11-24 DIAGNOSIS — H6123 Impacted cerumen, bilateral: Secondary | ICD-10-CM

## 2017-11-24 DIAGNOSIS — Z0000 Encounter for general adult medical examination without abnormal findings: Secondary | ICD-10-CM

## 2017-11-24 DIAGNOSIS — N529 Male erectile dysfunction, unspecified: Secondary | ICD-10-CM

## 2017-11-24 DIAGNOSIS — M5441 Lumbago with sciatica, right side: Secondary | ICD-10-CM | POA: Diagnosis not present

## 2017-11-24 DIAGNOSIS — G8929 Other chronic pain: Secondary | ICD-10-CM | POA: Diagnosis not present

## 2017-11-24 MED ORDER — SILDENAFIL CITRATE 20 MG PO TABS: ORAL_TABLET | ORAL | 2 refills | Status: DC

## 2017-11-24 MED ORDER — OMEGA-3-ACID ETHYL ESTERS 1 G PO CAPS: 1.0000 g | ORAL_CAPSULE | Freq: Two times a day (BID) | ORAL | Status: DC

## 2017-11-24 NOTE — Progress Notes (Addendum)
Subjective:    Patient ID: Derek Cook, male    DOB: [birth year], 57 y.o.   MRN: [id number]  Derek Cook is a 57 y.o. male presenting on 11/24/2017 for Annual Exam   HPI   Annual Physical and Lab Review.  FOLLOW-UP Chronic Low Back Pain, w/ sciatica Last seen 09/09/17 by me and also recently 10/20/17, he was treated with NSAID, muscle relaxant, ultimately required prednisone taper, see note for details. - Interval - he was referred prior to [facility] and saw them was supposed to get MRI but he never got this completed, has not returned, his back is improved. - Today he reports feels good, he had recent inc stress on back and did not have any pain or did not bother him - Completed Tramadol and Baclofen - For work, has old truck with new seat in it helping - helps posture  Pre-Diabetes Reports no concerns. Recent A1c slightly increased 5.9 Meds: Never on meds Diet changes - no soda, sweet tea  Hyperlipidemia, suspected familial hyperlipidemia / hyperTG - Prior history of chronic issue hyperTG, he has tried several statins in past, reports he has failed x 3 different statins. He had statin induced myalgia intolerance - Last lipid panel with elevated total cholesterol, borderline HDL, unable to calc LDL. And TG >600 - Currently taking Livalo 2mg  daily - He has tried omega 3 fish oil before unsure if it was helping so he stopped  Erectile Dysfunction: - Reports difficulty with maintaining erection, able to get erection but loses it quickly, more frequent occurrences over past 1 year worse. Has never been evaluated for this problem before and never taken any medications for ED. - States he remains sexually active with his wife - Admits to good sexual desire - Requesting trial on generic viagra  Health Maintenance:  Prostate CA Screening: Last PSA 0.6 (11/2017). Currently asymptomatic. No known family history of prostate CA.   Colon CA Screening: Last Colonoscopy, results  with 1-2 polyps removed with negative pathology, good for 5-10 years by his report. Currently asymptomatic. No known family history of colon CA. Due for screening test for repeat colonoscopy as soon as later this year, he will wait for checking ins or deductible also he is considering Cologuard option   Depression screen [facility] 2/9 11/24/2017 06/21/2017 04/05/2017  Decreased Interest 0 0 0  Down, Depressed, Hopeless 0 0 0  PHQ - 2 Score 0 0 0    Past Medical History:  Diagnosis Date  . Allergy   . Asthma   . Colon polyp   . GERD (gastroesophageal reflux disease)   . Hyperlipidemia    Past Surgical History:  Procedure Laterality Date  . CLAVICLE EXCISION    . HERNIA REPAIR  2004, 2005  . WRIST ARTHROPLASTY     Social History   Socioeconomic History  . Marital status: Married    Spouse name: Not on file  . Number of children: Not on file  . Years of education: Not on file  . Highest education level: Not on file  Social Needs  . Financial resource strain: Not on file  . Food insecurity - worry: Not on file  . Food insecurity - inability: Not on file  . Transportation needs - medical: Not on file  . Transportation needs - non-medical: Not on file  Occupational History  . Occupation: Portable MRI Administrator    Comment: Switching to day shifts (from nights)  Tobacco Use  . Smoking status:  Former Smoker    Last attempt to quit: 09/07/2009    Years since quitting: 8.2  . Smokeless tobacco: Former Network engineer and Sexual Activity  . Alcohol use: No  . Drug use: No  . Sexual activity: Not on file  Other Topics Concern  . Not on file  Social History Narrative  . Not on file   Family History  Problem Relation Age of Onset  . Cancer Mother        melanoma  . Diabetes Mother   . Hyperlipidemia Mother   . Dementia Mother   . Cancer Father   . Cancer Sister   . Heart disease Sister   . Diabetes Brother   . Hyperlipidemia Brother   . Dementia Paternal Grandmother     Current Outpatient Medications on File Prior to Visit  Medication Sig  . ADVAIR DISKUS 250-50 MCG/DOSE AEPB Inhale 1 puff into the lungs 2 (two) times daily.  Marland Kitchen aspirin EC 81 MG tablet Take 81 mg by mouth.  . clotrimazole-betamethasone (LOTRISONE) cream Apply 1-2 times a day for worsening flare dry skin dermatitis of toes/feet, may re-use daily up to 1 week as needed.  . cyclobenzaprine (FLEXERIL) 10 MG tablet Take 1 tablet (10 mg total) by mouth 3 (three) times daily as needed for muscle spasms.  Water engineer Bandages & Supports (MEDICAL COMPRESSION SOCKS) MISC 1 each by Does not apply route daily.  . fluticasone (FLONASE) 50 MCG/ACT nasal spray Place 2 sprays into both nostrils daily. Use for 4-6 weeks then stop and use seasonally or as needed.  Marland Kitchen ipratropium (ATROVENT) 0.06 % nasal spray Place 2 sprays into both nostrils 4 (four) times daily. For up to 5-7 days then stop.  Marland Kitchen LIVALO 2 MG TABS Take 1 tablet (2 mg total) by mouth daily.  . meloxicam (MOBIC) 15 MG tablet 1 TABLET BY MOUTH ONCE DAILY  . omeprazole (PRILOSEC) 40 MG capsule Take 1 capsule (40 mg total) by mouth daily.  . ondansetron (ZOFRAN ODT) 4 MG disintegrating tablet Take 1 tablet (4 mg total) by mouth every 8 (eight) hours as needed for nausea or vomiting.  Marland Kitchen PROVENTIL HFA 108 (90 Base) MCG/ACT inhaler Inhale 2 puffs into the lungs every 4 (four) hours as needed for wheezing or shortness of breath.  . theophylline (THEODUR) 300 MG 12 hr tablet Take 300 mg by mouth daily.   . fluticasone (FLONASE) 50 MCG/ACT nasal spray fluticasone propionate 50 mcg/actuation nasal spray,suspension   No current facility-administered medications on file prior to visit.     Review of Systems  Constitutional: Negative for activity change, appetite change, chills, diaphoresis, fatigue, fever and unexpected weight change.  HENT: Negative for congestion, hearing loss and sinus pressure.        Ear wax pressure fullness  Eyes: Negative for visual  disturbance.  Respiratory: Negative for apnea, cough, choking, chest tightness, shortness of breath and wheezing.   Cardiovascular: Negative for chest pain, palpitations and leg swelling.  Gastrointestinal: Negative for abdominal pain, anal bleeding, blood in stool, constipation, diarrhea, nausea and vomiting.  Endocrine: Negative for cold intolerance.  Genitourinary: Negative for decreased urine volume, difficulty urinating, dysuria, frequency, hematuria, testicular pain and urgency.  Musculoskeletal: Negative for arthralgias, back pain (None active today, now improved, ) and neck pain.  Skin: Negative for rash.  Allergic/Immunologic: Negative for environmental allergies.  Neurological: Negative for dizziness, weakness, light-headedness, numbness and headaches.  Hematological: Negative for adenopathy.  Psychiatric/Behavioral: Negative for behavioral problems, dysphoric mood and  sleep disturbance. The patient is not nervous/anxious.    Per HPI unless specifically indicated above     Objective:    BP 131/85   Pulse 77   Temp 97.9 F (36.6 C) (Oral)   Resp 16   Ht 5\' 8"  (1.727 m)   Wt 241 lb (109.3 kg)   BMI 36.64 kg/m   Wt Readings from Last 3 Encounters:  11/24/17 241 lb (109.3 kg)  10/20/17 246 lb 9.6 oz (111.9 kg)  09/09/17 244 lb 9.6 oz (110.9 kg)    Physical Exam  Constitutional: He is oriented to person, place, and time. He appears well-developed and well-nourished. No distress.  Well-appearing, comfortable, cooperative, obese  HENT:  Head: Normocephalic and atraumatic.  Mouth/Throat: Oropharynx is clear and moist.  Frontal / maxillary sinuses non-tender. Nares patent without purulence or edema. Bilateral TMs visible but with cerumen soft large amount some impaction without erythema, effusion or bulging, repeat exam see below cleared and normal TM. Oropharynx clear without erythema, exudates, edema or asymmetry.  Eyes: Conjunctivae and EOM are normal. Pupils are equal,  round, and reactive to light. Right eye exhibits no discharge. Left eye exhibits no discharge.  Neck: Normal range of motion. Neck supple. No thyromegaly present.  Cardiovascular: Normal rate, regular rhythm, normal heart sounds and intact distal pulses.  No murmur heard. Pulmonary/Chest: Effort normal and breath sounds normal. No respiratory distress. He has no wheezes. He has no rales.  Abdominal: Soft. Bowel sounds are normal. He exhibits no distension and no mass. There is no tenderness.  Musculoskeletal: Normal range of motion. He exhibits no edema or tenderness.  Upper / Lower Extremities: - Normal muscle tone, strength bilateral upper extremities 5/5, lower extremities 5/5  Lymphadenopathy:    He has no cervical adenopathy.  Neurological: He is alert and oriented to person, place, and time.  Distal sensation intact to light touch all extremities  Skin: Skin is warm and dry. No rash noted. He is not diaphoretic. No erythema.  Psychiatric: He has a normal mood and affect. His behavior is normal.  Well groomed, good eye contact, normal speech and thoughts  Nursing note and vitals reviewed.  ________________________________________________________ PROCEDURE NOTE Date: 11/24/17 Bilateral Ear Lavage / Cerumen Removal Discussed benefits and risks (including pain / discomforts, dizziness, minor abrasion of ear canal). Verbal consent given by patient. Medication:  Ear Lavage Solution (warm water + hydrogen peroxide) Performed by Donnie Mesa, CMA and Dr Parks Ranger Ear lavage solution flushed into one ear at a time in attempt to dislodge and remove ear wax. Results with dramatic improvement after all ear wax removed. Provider was able to remove some small amount of wax residual with curette.  Repeat Ear Exam: - Completely removed cerumen now, with clear ear canals and visible TMs clear and normal appearance.   Results for orders placed or performed in visit on 11/08/17  PSA, Total with  Reflex to PSA, Free  Result Value Ref Range   PSA, Total 0.6 < OR = 4.0 ng/mL  Lipid panel  Result Value Ref Range   Cholesterol 215 (H) <200 mg/dL   HDL 39 (L) >40 mg/dL   Triglycerides 651 (H) <150 mg/dL   LDL Cholesterol (Calc)  mg/dL (calc)   Total CHOL/HDL Ratio 5.5 (H) <5.0 (calc)   Non-HDL Cholesterol (Calc) 176 (H) <130 mg/dL (calc)  CBC with Differential/Platelet  Result Value Ref Range   WBC 9.3 3.8 - 10.8 Thousand/uL   RBC 5.34 4.20 - 5.80 Million/uL   Hemoglobin  14.8 13.2 - 17.1 g/dL   HCT 43.3 38.5 - 50.0 %   MCV 81.1 80.0 - 100.0 fL   MCH 27.7 27.0 - 33.0 pg   MCHC 34.2 32.0 - 36.0 g/dL   RDW 13.9 11.0 - 15.0 %   Platelets 333 140 - 400 Thousand/uL   MPV 9.8 7.5 - 12.5 fL   Neutro Abs 5,478 1,500 - 7,800 cells/uL   Lymphs Abs 2,678 850 - 3,900 cells/uL   WBC mixed population 744 200 - 950 cells/uL   Eosinophils Absolute 288 15 - 500 cells/uL   Basophils Absolute 112 0 - 200 cells/uL   Neutrophils Relative % 58.9 %   Total Lymphocyte 28.8 %   Monocytes Relative 8.0 %   Eosinophils Relative 3.1 %   Basophils Relative 1.2 %  Hemoglobin A1c  Result Value Ref Range   Hgb A1c MFr Bld 5.9 (H) <5.7 % of total Hgb   Mean Plasma Glucose 123 (calc)   eAG (mmol/L) 6.8 (calc)  COMPLETE METABOLIC PANEL WITH GFR  Result Value Ref Range   Glucose, Bld 108 (H) 65 - 99 mg/dL   BUN 14 7 - 25 mg/dL   Creat 1.07 0.70 - 1.33 mg/dL   GFR, Est Non African American 78 > OR = 60 mL/min/1.77m2   GFR, Est African American 91 > OR = 60 mL/min/1.73m2   BUN/Creatinine Ratio NOT APPLICABLE 6 - 22 (calc)   Sodium 139 135 - 146 mmol/L   Potassium 4.4 3.5 - 5.3 mmol/L   Chloride 104 98 - 110 mmol/L   CO2 27 20 - 32 mmol/L   Calcium 9.4 8.6 - 10.3 mg/dL   Total Protein 6.6 6.1 - 8.1 g/dL   Albumin 4.0 3.6 - 5.1 g/dL   Globulin 2.6 1.9 - 3.7 g/dL (calc)   AG Ratio 1.5 1.0 - 2.5 (calc)   Total Bilirubin 0.5 0.2 - 1.2 mg/dL   Alkaline phosphatase (APISO) 92 40 - 115 U/L   AST 24 10 -  35 U/L   ALT 43 9 - 46 U/L  Testosterone  Result Value Ref Range   Testosterone 194 (L) 250 - 827 ng/dL      Assessment & Plan:   Problem List Items Addressed This Visit    Chronic low back pain    Significantly improved, now mostly asymptomatic Known thoracolumbar OA/DJD, prior X-ray Remains off most therapy now Advised to stay active, do not overdo lifting and strenuous exercise Follow-up in future with [facility] if needed, and MRI      Familial hypertriglyceridemia    Likely etiology for hyperTG See A&P HLD      Relevant Medications   omega-3 acid ethyl esters (LOVAZA) 1 g capsule   sildenafil (REVATIO) 20 MG tablet   Hyperlipidemia    Still chronic uncontrolled cholesterol and hyperTG >600, on statin and poor lifestyle Last lipid panel 11/2017 Elevated ASCVD risk Concern family history of genetic hyperlipidemia/TG Multiple statin intolerance (x 3 failed - update patient checked his record he has failed the following statins, Atorvastatin, Simvastatin, and Pravastatin)  with myalgias. Never on fibrate  Plan: 1. Continues Livalo 2mg  daily, tolerating well, not enough to control TG - Counseling on other cholesterol meds such as Fibrate or PSK9 inhib - Agree to start Omega 3 fish oil 1000mg  BID wc, may inc to 2000mg  BID in future if need - If needed we will consider fibrate vs trial on approval for PSK9 if need but he prefers to avoid injection.  He remains asymptomatic from this and prefers to avoid side effects again with prior statin intolerance 2. Encourage improved lifestyle - low carb/cholesterol, reduce portion size, start regular exercise with change to day shift 3. Re-check lipids 6-12 months, if not improved and concern with meds still refer to Cards vs Endo for advanced lipid management      Relevant Medications   omega-3 acid ethyl esters (LOVAZA) 1 g capsule   sildenafil (REVATIO) 20 MG tablet   Hypertension    Stable, controlled Currently off  anti HTN therapy      Relevant Medications   omega-3 acid ethyl esters (LOVAZA) 1 g capsule   sildenafil (REVATIO) 20 MG tablet   Prediabetes    Controlled Pre-DM with A1c slight inc from 5.7 to 5.9 Concern with obesity, HLDPlan:    1. Not on any therapy currently  2. Encourage improved lifestyle - low carb, low sugar diet, reduce portion size, continue improving regular exercise 3. Follow-up 6 mo PreDM A1c        Other Visit Diagnoses    Annual physical exam    -  Primary Reviewed health maintenance, updated Offered Colonoscopy refer vs cologuard - considering, check cost Encourage continue improving diet exercise lifestyle, wt loss    Erectile dysfunction, unspecified erectile dysfunction type     Trial on generic Sildenafil - advised on dosing regimen    Relevant Medications   sildenafil (REVATIO) 20 MG tablet   Bilateral impacted cerumen     Ear lavage, see procedure note Successful, resolved symptoms      Meds ordered this encounter  Medications  . omega-3 acid ethyl esters (LOVAZA) 1 g capsule    Sig: Take 1 capsule (1 g total) by mouth 2 (two) times daily.  . sildenafil (REVATIO) 20 MG tablet    Sig: Take 1-5 pills about 30 min prior to sex. Start with 1 and increase as needed.    Dispense:  20 tablet    Refill:  2    Follow up plan: Return in about 6 months (around 05/27/2018) for PreDM A1c, Weight.  [name], [facility] 11/24/2017, 10:41 PM

## 2017-11-24 NOTE — Assessment & Plan Note (Signed)
Controlled Pre-DM with A1c slight inc from 5.7 to 5.9 Concern with obesity, HLDPlan:    1. Not on any therapy currently  2. Encourage improved lifestyle - low carb, low sugar diet, reduce portion size, continue improving regular exercise 3. Follow-up 6 mo PreDM A1c

## 2017-11-24 NOTE — Patient Instructions (Addendum)
Thank you for coming to the office today.  1.  A1c 5.9 - keep up good work overall working on diet and exercise, weight loss to control sugar  Weight may be factor in testosterone as discussed, slightly low borderline testosterone not low enough to treat  For cholesterol it is likely genteic - start Fish Oil Omega 3 1095m (or 1 g) - twice daily with food, may need to take 2 twice daily in future  Continue Livalo for now   [place]  [address] [place] [address]Hours: 8:30AM-6:30PM Phone: [phone]  Glad to hear back is stable, keep up good work  FOllow-up with Neurosurgery as needed or with MRI   Colon Cancer Screening: - For all adults age 55+routine colon cancer screening is highly recommended.     - Recent guidelines [place] age [place] 458- Early detection of colon cancer is important, because often there are no warning signs or symptoms, also if found early usually it can be cured. Late stage is hard to treat.  - If you are not interested in Colonoscopy screening (if done and normal you could be cleared for 5 to 10 years until next due), then Cologuard is an excellent alternative for screening test for Colon Cancer. It is highly sensitive for detecting DNA of colon cancer from even the earliest stages. Also, there is NO bowel prep required. - If Cologuard is NEGATIVE, then it is good for 3 years before next due - If Cologuard is POSITIVE, then it is strongly advised to get a Colonoscopy, which allows the GI doctor to locate the source of the cancer or polyp (even very early stage) and treat it by removing it. ------------------------- If you would like to proceed with Cologuard (stool DNA test) - FIRST, call your insurance company and tell them you want to check cost of Cologuard tell them CPT Code [phone](it may be completely covered and you could get for no cost, OR max cost without any coverage is  about $600). Also, keep in mind if you do NOT open the kit, and decide not to do the test, you will NOT be charged, you should contact the company if you decide not to do the test. - If you want to proceed, you can notify [place]phone message, [place] or at next visit) and we will order it for you. The test kit will be delivered to you house within about 1 week. Follow instructions to collect sample, you may call the company for any help or questions, 24/7 telephone support at [phone]   Please schedule a Follow-up Appointment to: Return in about 6 months (around 05/27/2018) for PreDM A1c, Weight.   If you have any other questions or concerns, please feel free to call the office or send a message through [place] You may also schedule an earlier appointment if necessary.  Additionally, you may be receiving a survey about your experience at our office within a few days to 1 week by e-mail or mail. We value your feedback.  ANobie Putnam DO [place]

## 2017-11-24 NOTE — Assessment & Plan Note (Signed)
Likely etiology for hyperTG See A&P HLD

## 2017-11-24 NOTE — Assessment & Plan Note (Signed)
Still chronic uncontrolled cholesterol and hyperTG >600, on statin and poor lifestyle Last lipid panel 11/2017 Elevated ASCVD risk Concern family history of genetic hyperlipidemia/TG Multiple statin intolerance (x 3 failed)  with myalgias. Never on fibrate  Plan: 1. Continues Livalo 2mg  daily, tolerating well, not enough to control TG - Counseling on other cholesterol meds such as Fibrate or PSK9 inhib - Agree to start Omega 3 fish oil 1000mg  BID wc, may inc to 2000mg  BID in future if need - If needed we will consider fibrate vs trial on approval for PSK9 if need but he prefers to avoid injection. He remains asymptomatic from this and prefers to avoid side effects again with prior statin intolerance 2. Encourage improved lifestyle - low carb/cholesterol, reduce portion size, start regular exercise with change to day shift 3. Re-check lipids 6-12 months, if not improved and concern with meds still refer to Cards vs Endo for advanced lipid management

## 2017-11-24 NOTE — Assessment & Plan Note (Signed)
Stable, controlled Currently off anti HTN therapy

## 2017-11-24 NOTE — Assessment & Plan Note (Signed)
Significantly improved, now mostly asymptomatic Known thoracolumbar OA/DJD, prior X-ray Remains off most therapy now Advised to stay active, do not overdo lifting and strenuous exercise Follow-up in future with [facility] if needed, and MRI

## 2017-12-01 ENCOUNTER — Other Ambulatory Visit: Payer: Self-pay

## 2017-12-01 DIAGNOSIS — E782 Mixed hyperlipidemia: Secondary | ICD-10-CM

## 2017-12-01 MED ORDER — LIVALO 2 MG PO TABS: 2.0000 mg | ORAL_TABLET | Freq: Every day | ORAL | 1 refills | Status: DC

## 2017-12-03 ENCOUNTER — Telehealth: Payer: Self-pay | Admitting: Family Medicine

## 2017-12-03 ENCOUNTER — Encounter (INDEPENDENT_AMBULATORY_CARE_PROVIDER_SITE_OTHER): Payer: Self-pay

## 2017-12-03 DIAGNOSIS — M791 Myalgia, unspecified site: Principal | ICD-10-CM

## 2017-12-03 DIAGNOSIS — T466X5A Adverse effect of antihyperlipidemic and antiarteriosclerotic drugs, initial encounter: Secondary | ICD-10-CM

## 2017-12-03 NOTE — Telephone Encounter (Signed)
Attempted to call patient today 3/29, 530pm, did not reach, left him a voicemail with detailed information, and sent him a MyChart message as well.  Will stay tuned.  [name], [facility] 12/03/2017, 5:31 PM

## 2017-12-03 NOTE — Telephone Encounter (Signed)
Submitted PA for Livalo 2mg  daily statin, due to patient's history of myalgias and statin intolerance. This request was denied. He has failed 3 statins, Atorvastatin, Simvastatin, Rosuvastatin. Insurance plan [employer] [employer] requires he continue to try formulary options such as:  - Atorvastatin - Ezetimbe-Simvastatin - Fluvastatin - Lovastatin - Pravastatin - Rosuvastatin - Simvastatin -----------------------------------------------------  I would recommend considering Fluvastatin 20mg  = lowest dose. He may start with 1 tab once weekly for first 4 weeks, and then gradually increase to 2 times weekly as tolerated. I would recommend max dose of 3 times a week if he tolerates taking it.  I am not sure how many of these statins he is required to try before they will cover Livalo.  Lastly, we can consider referral to Cardiologist or Lipid Specialist who may be able to help more regarding this problem.  [name], [facility] 12/03/2017, 8:18 AM

## 2017-12-07 ENCOUNTER — Other Ambulatory Visit: Payer: Self-pay | Admitting: Family Medicine

## 2017-12-07 DIAGNOSIS — N529 Male erectile dysfunction, unspecified: Secondary | ICD-10-CM

## 2017-12-08 ENCOUNTER — Telehealth: Payer: Self-pay | Admitting: Family Medicine

## 2017-12-08 DIAGNOSIS — E7849 Other hyperlipidemia: Secondary | ICD-10-CM

## 2017-12-08 MED ORDER — PRAVASTATIN SODIUM 10 MG PO TABS: ORAL_TABLET | ORAL | 0 refills | Status: DC

## 2017-12-08 NOTE — Telephone Encounter (Signed)
Sent new rx Pravastatin 10mg  tabs to [facility] - Start taking 1 tab weekly, gradually increase to 1 tab twice weekly, up to max 3 times weekly, in future may consider daily use if tolerated  If myalgias then next trial would be Fluvastatin, then we would possibly contact insurance again for repeat Prior Auth to see if can get Livalo covered as next step.  [name], [facility] 12/08/2017, 4:44 PM

## 2017-12-08 NOTE — Addendum Note (Signed)
Addended by: Olin Hauser on: 12/08/2017 04:45 PM   Modules accepted: Orders

## 2017-12-08 NOTE — Telephone Encounter (Signed)
patient stopped by office asking for statin Rx [facility] pharmacy preferred.

## 2018-01-20 ENCOUNTER — Other Ambulatory Visit: Payer: Self-pay | Admitting: Family Medicine

## 2018-01-20 DIAGNOSIS — J4530 Mild persistent asthma, uncomplicated: Secondary | ICD-10-CM

## 2018-03-01 ENCOUNTER — Other Ambulatory Visit: Payer: Self-pay | Admitting: Family Medicine

## 2018-03-01 ENCOUNTER — Telehealth: Payer: Self-pay | Admitting: Family Medicine

## 2018-03-01 DIAGNOSIS — E7849 Other hyperlipidemia: Secondary | ICD-10-CM

## 2018-03-01 NOTE — Telephone Encounter (Signed)
We received refill request for his Pravastatin.  This rx was sent back on 12/08/17 - Pravastatin 10mg  tabs to [facility] - Start taking 1 tab weekly, gradually increase to 1 tab twice weekly, up to max 3 times weekly, in future may consider daily use if tolerated  Could you call patient to clarify how many times a day this patient is currently taking the med? I will need to send appropriate # of pills for a 3 month supply if he wants to continue with it - but have to know how many times a day or week he is taking it.  Thanks  [name], [facility] 03/01/2018, 6:16 PM

## 2018-03-02 NOTE — Telephone Encounter (Signed)
Left message for patient to call back  

## 2018-03-03 NOTE — Telephone Encounter (Signed)
As per patient taking Pravastatin 1 tab twice weekly.

## 2018-03-29 ENCOUNTER — Telehealth: Payer: Self-pay | Admitting: Family Medicine

## 2018-03-29 DIAGNOSIS — E7801 Familial hypercholesterolemia: Secondary | ICD-10-CM

## 2018-03-29 NOTE — Telephone Encounter (Signed)
He was taking Pravastatin 10mg  dose one pill TWICE weekly as our last update.  Please notify him that he may stop taking this medicine if it is causing myalgias and muscle aches.  Next time in office or as soon as he is feeling better and side effect is resolved - then I would recommend switching to one last trial - Fluvastatin. If this causes same result, then he would have tried all available medicines and then insurance may cover the other options we have reviewed before.  [name], [facility] 03/29/2018, 6:37 PM

## 2018-03-29 NOTE — Telephone Encounter (Signed)
Pt called states that his cholesterol medication was making him have back pain and joint pains. Pt call back  # is [phone]

## 2018-03-30 MED ORDER — LIVALO 2 MG PO TABS: 2.0000 mg | ORAL_TABLET | Freq: Every day | ORAL | 3 refills | Status: DC

## 2018-03-30 NOTE — Addendum Note (Signed)
Addended by: Olin Hauser on: 03/30/2018 11:20 AM   Modules accepted: Orders

## 2018-03-30 NOTE — Telephone Encounter (Signed)
Attempted to contact the pt, no answer. LMOm to return my call.  

## 2018-03-30 NOTE — Telephone Encounter (Signed)
Discontinued Pravastatin. Re-sent new rx Livalo 2mg  daily statin - see prior documentation on last attempt at rx this and our completion of Prior Authorization that was denied.  [name], [facility] 03/30/2018, 11:20 AM

## 2018-03-30 NOTE — Telephone Encounter (Signed)
The pt was notified and requesting to go back on the Livalo. Pt states no issue with this medication. He's requesting that we submit the PA, because it's not a preferred medication.

## 2018-04-26 ENCOUNTER — Telehealth: Payer: Self-pay | Admitting: Family Medicine

## 2018-04-26 NOTE — Telephone Encounter (Signed)
Pt is waiting on a call back about cholesterol medication [phone]

## 2018-04-27 NOTE — Telephone Encounter (Signed)
Patient Derek Cook was denied even if proceed with prior approval with 3 different attempt patient is aware, he will check the cost with pharmacy and if too expensive then call us back. His Rx was send in 03/2018.

## 2018-04-27 NOTE — Telephone Encounter (Signed)
Left message

## 2018-04-28 NOTE — Telephone Encounter (Signed)
Called patient he is at [facility] right now his Livalo cost him $392 he will find out with his brother in law about his cholesterol med and call the office back.

## 2018-04-28 NOTE — Telephone Encounter (Signed)
Reviewed denial paperwork.  His plan requires him to fail ALL alternatives.  Here is the complete list  - Atorvastatin - Ezetimbe-Simvastatin - Fluvastatin - Lovastatin - Pravastatin - Rosuvastatin - Simvastatin  ----------- He has tried and failed: - Atorvastatin, Simvastatin, Rosuvastatin, and most recently Pravastatin  He will still need to try and fail: - Ezetimbe-Simvastatin - Fluvastatin - Lovastatin  Before it would be covered.  [name], [facility] 04/28/2018, 1:24 PM

## 2018-06-01 ENCOUNTER — Ambulatory Visit: Payer: PRIVATE HEALTH INSURANCE | Admitting: Family Medicine

## 2018-06-07 ENCOUNTER — Telehealth: Payer: Self-pay | Admitting: Family Medicine

## 2018-06-07 DIAGNOSIS — E7801 Familial hypercholesterolemia: Secondary | ICD-10-CM

## 2018-06-07 MED ORDER — ROSUVASTATIN CALCIUM 5 MG PO TABS: 5.0000 mg | ORAL_TABLET | ORAL | 0 refills | Status: DC

## 2018-06-07 NOTE — Telephone Encounter (Signed)
Pt wanted to now if you would call in a low dosages of Crestor.

## 2018-06-07 NOTE — Telephone Encounter (Signed)
I have reviewed telephone messages and last chart note.  Patient has previously failed rosuvastatin, but I am not aware of the dose.  It seems that Crestor must be what his brother tolerates, so I have sent rosuvastatin (Crestor) 5 mg tab.  Take 1 tablet once weekly for now.  Can work to slowly increase dose with Dr. Raliegh Ip as tolerated.

## 2018-06-08 ENCOUNTER — Ambulatory Visit: Payer: PRIVATE HEALTH INSURANCE | Admitting: Family Medicine

## 2018-06-08 NOTE — Telephone Encounter (Signed)
Left detail message. 

## 2018-06-10 ENCOUNTER — Ambulatory Visit: Payer: PRIVATE HEALTH INSURANCE | Admitting: Family Medicine

## 2018-07-04 ENCOUNTER — Encounter: Payer: Self-pay | Admitting: Family Medicine

## 2018-07-04 ENCOUNTER — Other Ambulatory Visit: Payer: Self-pay | Admitting: Family Medicine

## 2018-07-04 ENCOUNTER — Ambulatory Visit (INDEPENDENT_AMBULATORY_CARE_PROVIDER_SITE_OTHER): Payer: PRIVATE HEALTH INSURANCE | Admitting: Family Medicine

## 2018-07-04 VITALS — BP 133/86 | HR 76 | Temp 98.3°F | Resp 16 | Ht 68.0 in | Wt 250.0 lb

## 2018-07-04 DIAGNOSIS — K219 Gastro-esophageal reflux disease without esophagitis: Secondary | ICD-10-CM

## 2018-07-04 DIAGNOSIS — N5089 Other specified disorders of the male genital organs: Secondary | ICD-10-CM | POA: Diagnosis not present

## 2018-07-04 NOTE — Progress Notes (Addendum)
Subjective:    Patient ID: Derek Cook, male    DOB: 1960-10-01, 57 y.o.   MRN: [id number]  Derek Cook is a 57 y.o. 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 He has fam history of polyps not colon CA. He is considering cologuard.  **UPDATE received Colonoscopy record from Dr Porfirio Mylar Asgharian at [place] - colonoscopy done 05/21/11, 1 polyp, benign, repeat 10 years - scanned to chart  Depression screen [facility] 2/9 07/04/2018 11/24/2017 06/21/2017  Decreased Interest 0 0 0  Down, Depressed, Hopeless 0 0 0  PHQ - 2 Score 0 0 0    Social History   Tobacco Use  . Smoking status: Former Smoker    Last attempt to quit: 09/07/2009    Years since quitting: 8.8  . Smokeless tobacco: Former Network engineer Use Topics  . Alcohol use: No  . Drug use: No    Review of Systems Per HPI unless specifically indicated above     Objective:    BP 133/86   Pulse 76   Temp 98.3 F (36.8 C) (Oral)   Resp 16   Ht 5\' 8"  (1.727 m)   Wt 250  lb (113.4 kg)   BMI 38.01 kg/m   Wt Readings from Last 3 Encounters:  07/04/18 250 lb (113.4 kg)  11/24/17 241 lb (109.3 kg)  10/20/17 246 lb 9.6 oz (111.9 kg)    Physical Exam  Constitutional: He is oriented to person, place, and time. He appears well-developed and well-nourished. No distress.  Well-appearing, comfortable, cooperative  HENT:  Head: Normocephalic and atraumatic.  Mouth/Throat: Oropharynx is clear and moist.  Eyes: Conjunctivae are normal. Right eye exhibits no discharge. Left eye exhibits no discharge.  Cardiovascular: Normal rate.  Pulmonary/Chest: Effort normal.  Genitourinary:  Genitourinary Comments: External Male Genitalia Exam - Identified palpable < 1 cm spot on R testicle lower aspect, slightly firm non tender over spot, some vasculature nearby, no edema or erythema or other testicular abnormality  Musculoskeletal: He exhibits no edema.  Neurological: He is alert and oriented to person, place, and time.  Skin: Skin is warm and dry. No rash noted. He is not diaphoretic. No erythema.  Psychiatric: He has a normal mood and affect. His behavior is normal.  Well groomed, good eye contact, normal speech and thoughts  Nursing note and vitals reviewed.  Results for orders placed or performed in visit on 11/08/17  PSA, Total with Reflex to PSA, Free  Result Value Ref Range  PSA, Total 0.6 < OR = 4.0 ng/mL  Lipid panel  Result Value Ref Range   Cholesterol 215 (H) <200 mg/dL   HDL 39 (L) >40 mg/dL   Triglycerides 651 (H) <150 mg/dL   LDL Cholesterol (Calc)  mg/dL (calc)   Total CHOL/HDL Ratio 5.5 (H) <5.0 (calc)   Non-HDL Cholesterol (Calc) 176 (H) <130 mg/dL (calc)  CBC with Differential/Platelet  Result Value Ref Range   WBC 9.3 3.8 - 10.8 Thousand/uL   RBC 5.34 4.20 - 5.80 Million/uL   Hemoglobin 14.8 13.2 - 17.1 g/dL   HCT 43.3 38.5 - 50.0 %   MCV 81.1 80.0 - 100.0 fL   MCH 27.7 27.0 - 33.0 pg   MCHC 34.2 32.0 - 36.0 g/dL   RDW 13.9 11.0 - 15.0 %    Platelets 333 140 - 400 Thousand/uL   MPV 9.8 7.5 - 12.5 fL   Neutro Abs 5,478 1,500 - 7,800 cells/uL   Lymphs Abs 2,678 850 - 3,900 cells/uL   WBC mixed population 744 200 - 950 cells/uL   Eosinophils Absolute 288 15 - 500 cells/uL   Basophils Absolute 112 0 - 200 cells/uL   Neutrophils Relative % 58.9 %   Total Lymphocyte 28.8 %   Monocytes Relative 8.0 %   Eosinophils Relative 3.1 %   Basophils Relative 1.2 %  Hemoglobin A1c  Result Value Ref Range   Hgb A1c MFr Bld 5.9 (H) <5.7 % of total Hgb   Mean Plasma Glucose 123 (calc)   eAG (mmol/L) 6.8 (calc)  COMPLETE METABOLIC PANEL WITH GFR  Result Value Ref Range   Glucose, Bld 108 (H) 65 - 99 mg/dL   BUN 14 7 - 25 mg/dL   Creat 1.07 0.70 - 1.33 mg/dL   GFR, Est Non African American 78 > OR = 60 mL/min/1.57m2   GFR, Est African American 91 > OR = 60 mL/min/1.17m2   BUN/Creatinine Ratio NOT APPLICABLE 6 - 22 (calc)   Sodium 139 135 - 146 mmol/L   Potassium 4.4 3.5 - 5.3 mmol/L   Chloride 104 98 - 110 mmol/L   CO2 27 20 - 32 mmol/L   Calcium 9.4 8.6 - 10.3 mg/dL   Total Protein 6.6 6.1 - 8.1 g/dL   Albumin 4.0 3.6 - 5.1 g/dL   Globulin 2.6 1.9 - 3.7 g/dL (calc)   AG Ratio 1.5 1.0 - 2.5 (calc)   Total Bilirubin 0.5 0.2 - 1.2 mg/dL   Alkaline phosphatase (APISO) 92 40 - 115 U/L   AST 24 10 - 35 U/L   ALT 43 9 - 46 U/L  Testosterone  Result Value Ref Range   Testosterone 194 (L) 250 - 827 ng/dL      Assessment & Plan:   Problem List Items Addressed This Visit    None    Visit Diagnoses    Mass of right testicle    -  Primary   Relevant Orders   US SCROTUM W/DOPPLER     Clinically with newly identified small < 1 cm approx mostly firm and some tender scrotal mass, seems to be reduced in size by his report, over past few months new finding.  Plan Ordered Scrotal US test today to be scheduled at  [facility] for diagnosing potential mass If concerning or indeterminate will refer to Urology for 2nd opinion   #colon ca  screening Will request record for prior colonoscopy approx 2011-2012 [facility] - thought to be benign, if results reviewed or if cannot obtain  will again recommend cologuard testing/ screening he will check cost notify if ready to order  No orders of the defined types were placed in this encounter.   Follow up plan: Return in about 4 weeks (around 08/01/2018), or if symptoms worsen or fail to improve, for R testiscular mass.  Recommended to follow-up as anticipated in 12/2018 - for yearly with labs.  [name], [facility] 07/04/2018, 11:25 AM

## 2018-07-04 NOTE — Patient Instructions (Addendum)
Thank you for coming to the office today.  Ordered Testicular Ultrasound - stay tuned for scheduled apt for imaging - then we will follow-up on results, if need we can refer you to local Urologist to discuss results further and if possible needed may consider biopsy, otherwise if benign appearing we may monitor it with repeat ultrasound if needed  Colon Cancer Screening: - For all adults age 55+ routine colon cancer screening is highly recommended.     - Recent guidelines from [place] recommend starting age of 74 - Early detection of colon cancer is important, because often there are no warning signs or symptoms, also if found early usually it can be cured. Late stage is hard to treat.  - If you are not interested in Colonoscopy screening (if done and normal you could be cleared for 5 to 10 years until next due), then Cologuard is an excellent alternative for screening test for Colon Cancer. It is highly sensitive for detecting DNA of colon cancer from even the earliest stages. Also, there is NO bowel prep required. - If Cologuard is NEGATIVE, then it is good for 3 years before next due - If Cologuard is POSITIVE, then it is strongly advised to get a Colonoscopy, which allows the GI doctor to locate the source of the cancer or polyp (even very early stage) and treat it by removing it. ------------------------- If you would like to proceed with Cologuard (stool DNA test) - FIRST, call your insurance company and tell them you want to check cost of Cologuard tell them CPT Code [phone] (it may be completely covered and you could get for no cost, OR max cost without any coverage is about $600). Also, keep in mind if you do NOT open the kit, and decide not to do the test, you will NOT be charged, you should contact the company if you decide not to do the test. - If you want to proceed, you can notify us (phone message, [place], or at next visit) and we will order it for you. The test  kit will be delivered to you house within about 1 week. Follow instructions to collect sample, you may call the company for any help or questions, 24/7 telephone support at [phone].  DUE for FASTING BLOOD WORK (no food or drink after midnight before the lab appointment, only water or coffee without cream/sugar on the morning of)  SCHEDULE "Lab Only" visit in the morning at the clinic for lab draw in 5-6 MONTHS   - Make sure Lab Only appointment is at about 1 week before your next appointment, so that results will be available  For Lab Results, once available within 2-3 days of blood draw, you can can log in to MyChart online to view your results and a brief explanation. Also, we can discuss results at next follow-up visit.   Please schedule a Follow-up Appointment to: Return in about 4 weeks (around 08/01/2018), or if symptoms worsen or fail to improve, for R testiscular mass.  If you have any other questions or concerns, please feel free to call the office or send a message through [place]. You may also schedule an earlier appointment if necessary.  Additionally, you may be receiving a survey about your experience at our office within a few days to 1 week by e-mail or mail. We value your feedback.  [name], DO [place]

## 2018-07-07 ENCOUNTER — Ambulatory Visit
Admission: RE | Admit: 2018-07-07 | Discharge: 2018-07-07 | Disposition: A | Discharge status: Home / Self Care | Payer: 59 | Source: Ambulatory Visit | Attending: Family Medicine | Admitting: Family Medicine

## 2018-07-07 DIAGNOSIS — N5089 Other specified disorders of the male genital organs: Secondary | ICD-10-CM | POA: Diagnosis present

## 2018-08-02 ENCOUNTER — Ambulatory Visit: Payer: PRIVATE HEALTH INSURANCE | Admitting: Family Medicine

## 2018-08-10 ENCOUNTER — Encounter: Payer: Self-pay | Admitting: Family Medicine

## 2018-08-10 ENCOUNTER — Ambulatory Visit (INDEPENDENT_AMBULATORY_CARE_PROVIDER_SITE_OTHER): Payer: PRIVATE HEALTH INSURANCE | Admitting: Family Medicine

## 2018-08-10 VITALS — BP 137/90 | HR 74 | Temp 98.0°F | Resp 16 | Ht 68.0 in | Wt 251.6 lb

## 2018-08-10 DIAGNOSIS — H6123 Impacted cerumen, bilateral: Secondary | ICD-10-CM

## 2018-08-10 DIAGNOSIS — J4530 Mild persistent asthma, uncomplicated: Secondary | ICD-10-CM

## 2018-08-10 NOTE — Patient Instructions (Addendum)
Thank you for coming to the office today.  Follow-up if not improved breathing / asthma  Please schedule a Follow-up Appointment to: Return in about 1 week (around 08/17/2018), or if symptoms worsen or fail to improve, for asthma.  If you have any other questions or concerns, please feel free to call the office or send a message through [place]. You may also schedule an earlier appointment if necessary.  Additionally, you may be receiving a survey about your experience at our office within a few days to 1 week by e-mail or mail. We value your feedback.  [name], DO [place]

## 2018-08-10 NOTE — Progress Notes (Signed)
Subjective:    Patient ID: Derek Cook, male    DOB: [birth year], 57 y.o.   MRN: [id number]  Derek Cook is a 57 y.o. male presenting on 08/10/2018 for Asthma (as per patient the chemical from batteries leaked and caused asthma)   HPI   Follow-up Asthma, exacerbation / exposure to chemical Reports he had episode about 2 weeks ago had exposure to chemicals at work from batteries, thinks fumes affected his breathing triggering asthma flare up, and was concerned about any potential burns in his mouth. He did not have significant oropharyngeal pain but wanted to get it checked out regardless. - He used advair with good results and albuterol PRN occasionally. Now he states ins not cover Advair brand but pharmacy was sending generic - Denies dyspnea, wheezing coughing fever chills  Bilateral Cerumen impaction, reduced hearing History of chronic reduced hearing, with hearing aids, now has had worsening recent problem with fullness in ears and ear wax build up requesting flushing today. Denies dizziness, acute hearing change or ear pain   Depression screen [facility] 2/9 08/10/2018 07/04/2018 11/24/2017  Decreased Interest 0 0 0  Down, Depressed, Hopeless 0 0 0  PHQ - 2 Score 0 0 0    Social History   Tobacco Use  . Smoking status: Former Smoker    Last attempt to quit: 09/07/2009    Years since quitting: 8.9  . Smokeless tobacco: Former Network engineer Use Topics  . Alcohol use: No  . Drug use: No    Review of Systems Per HPI unless specifically indicated above     Objective:    BP 137/90   Pulse 74   Temp 98 F (36.7 C) (Oral)   Resp 16   Ht 5\' 8"  (1.727 m)   Wt 251 lb 9.6 oz (114.1 kg)   SpO2 97%   BMI 38.26 kg/m   Wt Readings from Last 3 Encounters:  08/10/18 251 lb 9.6 oz (114.1 kg)  07/04/18 250 lb (113.4 kg)  11/24/17 241 lb (109.3 kg)    Physical Exam  Constitutional: He is oriented to person, place, and time. He appears well-developed and well-nourished. No  distress.  Well-appearing, comfortable, cooperative  HENT:  Head: Normocephalic and atraumatic.  Mouth/Throat: Oropharynx is clear and moist.  No oral ulcerations or issues. Appears L side of tongue has patchy loss of normal tissue but not ulcerated.  Bilateral TM obscured by impacted cerumen.  Eyes: Conjunctivae are normal. Right eye exhibits no discharge. Left eye exhibits no discharge.  Neck: Normal range of motion. Neck supple. No thyromegaly present.  Cardiovascular: Normal rate, regular rhythm, normal heart sounds and intact distal pulses.  No murmur heard. Pulmonary/Chest: Effort normal and breath sounds normal. No respiratory distress. He has no wheezes. He has no rales.  Musculoskeletal: Normal range of motion. He exhibits no edema.  Lymphadenopathy:    He has no cervical adenopathy.  Neurological: He is alert and oriented to person, place, and time.  Skin: Skin is warm and dry. No rash noted. He is not diaphoretic. No erythema.  Psychiatric: He has a normal mood and affect. His behavior is normal.  Well groomed, good eye contact, normal speech and thoughts  Nursing note and vitals reviewed.  ________________________________________________________ PROCEDURE NOTE Date: 08/10/18 Bilateral Ear Lavage / Cerumen Removal Discussed benefits and risks (including pain / discomforts, dizziness, minor abrasion of ear canal). Verbal consent given by patient. Medication:  carbamide peroxide ear drops, Ear Lavage Solution (warm water +  hydrogen peroxide) Performed by Dr Parks Ranger Several drops of carbamide peroxide placed in each ear, allowed to sit for few minutes. Ear lavage solution flushed into one ear at a time in attempt to dislodge and remove ear wax. Results were successful with removal of cerumen.  Repeat Ear Exam: - Completely removed cerumen now, with clear ear canals and visible TMs clear and normal appearance.  - improved hearing now.      Assessment & Plan:   Problem  List Items Addressed This Visit    Mild persistent asthma Clinically resolved recent possible triggered asthma flare due to inhalation of chemical On generic advair now due to cost of brand Use Albuterol PRN Avoid exposures Follow-up PRN - if worsening notify we can consider prednisone    Relevant Medications   Fluticasone-Salmeterol (ADVAIR DISKUS) 250-50 MCG/DOSE AEPB    Other Visit Diagnoses    Bilateral hearing loss due to cerumen impaction    -  Primary      Significant amount of large thick impacted cerumen bilaterally, suspected primary cause of current reduced bilateral hearing in setting of known chronic hearing loss  Plan: 1. Successful office ear lavage cerumen removal today, re-evaluated with clear ear canals and normal TMs 2. Counseled on avoiding Q-tips and may use Kleenex as wick, use OTC Debrox as needed 3. Follow-up as needed   No orders of the defined types were placed in this encounter.   Follow up plan: Return in about 1 week (around 08/17/2018), or if symptoms worsen or fail to improve, for asthma.  Derek Cook, [facility] 08/10/2018, 2:38 PM

## 2018-09-08 ENCOUNTER — Other Ambulatory Visit: Payer: Self-pay | Admitting: Nurse Practitioner

## 2018-09-08 DIAGNOSIS — E7801 Familial hypercholesterolemia: Secondary | ICD-10-CM

## 2018-11-19 ENCOUNTER — Other Ambulatory Visit: Payer: Self-pay | Admitting: Family Medicine

## 2018-11-19 DIAGNOSIS — J4530 Mild persistent asthma, uncomplicated: Secondary | ICD-10-CM

## 2019-01-31 ENCOUNTER — Ambulatory Visit
Admission: EM | Admit: 2019-01-31 | Discharge: 2019-01-31 | Disposition: A | Discharge status: Home / Self Care | Payer: 59 | Attending: Emergency Medicine | Admitting: Emergency Medicine

## 2019-01-31 ENCOUNTER — Other Ambulatory Visit: Payer: Self-pay

## 2019-01-31 ENCOUNTER — Other Ambulatory Visit: Payer: Self-pay | Admitting: Family Medicine

## 2019-01-31 ENCOUNTER — Encounter: Payer: Self-pay | Admitting: Emergency Medicine

## 2019-01-31 DIAGNOSIS — Z87891 Personal history of nicotine dependence: Secondary | ICD-10-CM | POA: Diagnosis not present

## 2019-01-31 DIAGNOSIS — B370 Candidal stomatitis: Secondary | ICD-10-CM

## 2019-01-31 DIAGNOSIS — J4530 Mild persistent asthma, uncomplicated: Secondary | ICD-10-CM

## 2019-01-31 DIAGNOSIS — J309 Allergic rhinitis, unspecified: Secondary | ICD-10-CM

## 2019-01-31 MED ORDER — CLOTRIMAZOLE 10 MG MT TROC: 10.0000 mg | Freq: Every day | OROMUCOSAL | 0 refills | Status: DC

## 2019-01-31 NOTE — ED Triage Notes (Signed)
Patient states he thinks he has thrush. Patient states he has been having to use his inhalers more frequently and now has irritation on his tongue and the inside of his cheek.

## 2019-01-31 NOTE — Discharge Instructions (Addendum)
Take medication as prescribed. Rest. Drink plenty of fluids. Use home allergy medication as discussed. Monitor.   Follow up with your primary care physician this week as needed. Return to Urgent care for new or worsening concerns.

## 2019-01-31 NOTE — ED Provider Notes (Signed)
[facility] ____________________________________________  Time seen: Approximately 11:52 AM  I have reviewed the triage vital signs and the nursing notes.   HISTORY  Chief Complaint mouth irritation  HPI Derek Cook is a 58 y.o. male with history hypertension, hyperlipidemia, asthma and seasonal allergies presenting for evaluation of tongue and oral irritation.  Patient states for the last few weeks his job has been requiring him to drive to [place], and states during this it flares up his allergies.  States he then has to use his inhalers more frequently.  States the inhalers do help and resolve the breathing issues, but he has now noticed over the last week irritation to the top of his tongue and inside his mouth more so on the left side.  States inside his mouth feels raw and irritated.  Did blow his nose the other day and noticed some blood but not continued.  Occasional sinus pressure but not continued.  Denies sinus pain.  Occasional post nasal drainage cough, denies other cough.  Denies known fevers.  Continues eat and drink well.  States otherwise feeling well.  Denies known sick contacts.  Has used Claritin at home, has not used his Flonase recently.  Denies other aggravating or alleviating factors.    Past Medical History:  Diagnosis Date  . Allergy   . Asthma   . Colon polyp   . GERD (gastroesophageal reflux disease)   . Hyperlipidemia     Patient Active Problem List   Diagnosis Date Noted  . Myalgia due to statin 12/03/2017  . Chronic low back pain 06/21/2017  . Multiple nevi 06/21/2017  . Familial hypertriglyceridemia 04/06/2017  . Plantar fasciitis of right foot 04/05/2017  . Allergic rhinitis due to allergen 09/17/2016  . Dry skin dermatitis 09/09/2016  . Carpal tunnel syndrome, left 06/25/2016  . GERD (gastroesophageal reflux disease) 05/29/2016  . Prediabetes 03/24/2016  . Mild persistent asthma 02/13/2016  . Hyperlipidemia 02/13/2016  .  Hypertension 02/13/2016    Past Surgical History:  Procedure Laterality Date  . CLAVICLE EXCISION    . HERNIA REPAIR  2004, 2005  . WRIST ARTHROPLASTY       No current facility-administered medications for this encounter.   Current Outpatient Medications:  .  aspirin EC 81 MG tablet, Take 81 mg by mouth., Disp: , Rfl:  .  fluticasone (FLONASE) 50 MCG/ACT nasal spray, Place 2 sprays into both nostrils daily. Use for 4-6 weeks then stop and use seasonally or as needed., Disp: 16 g, Rfl: 3 .  Fluticasone-Salmeterol (ADVAIR DISKUS) 250-50 MCG/DOSE AEPB, INHALE 1 PUFF INTO LUNGS TWICE DAILY, Disp: 60 each, Rfl: 11 .  PROAIR HFA 108 (90 Base) MCG/ACT inhaler, INHALE 2 PUFFS BY MOUTH EVERY 4 TO 6 HOURS AS NEEDED FOR WHEEZING/SHORTNESS OF BREATH, Disp: 6.7 g, Rfl: 3 .  sildenafil (REVATIO) 20 MG tablet, TAKE 1 TO 5 TABLETS BY MOUTH 30 MINUTES PRIOR TO SEX.(START WITH 1 TAB AND INCREASE AS NEEDED, Disp: 20 tablet, Rfl: 2 .  clotrimazole (MYCELEX) 10 MG troche, Take 1 tablet (10 mg total) by mouth 5 (five) times daily., Disp: 70 tablet, Rfl: 0 .  Elastic Bandages & Supports (MEDICAL COMPRESSION SOCKS) MISC, 1 each by Does not apply route daily., Disp: 2 each, Rfl: 0 .  ipratropium (ATROVENT) 0.06 % nasal spray, Place 2 sprays into both nostrils 4 (four) times daily. For up to 5-7 days then stop., Disp: 15 mL, Rfl: 0 .  meloxicam (MOBIC) 15 MG tablet, 1 TABLET  BY MOUTH ONCE DAILY, Disp: 30 tablet, Rfl: 2  Allergies Statins; Azithromycin; Shellfish-derived products; and Sulfa antibiotics  Family History  Problem Relation Age of Onset  . Cancer Mother        melanoma  . Diabetes Mother   . Hyperlipidemia Mother   . Dementia Mother   . Cancer Father   . Cancer Sister   . Heart disease Sister   . Diabetes Brother   . Hyperlipidemia Brother   . Dementia Paternal Grandmother     Social History Social History   Tobacco Use  . Smoking status: Former Smoker    Last attempt to quit:  09/07/2009    Years since quitting: 9.4  . Smokeless tobacco: Former Network engineer Use Topics  . Alcohol use: No  . Drug use: No    Review of Systems Constitutional: No fever Eyes: No visual changes. ENT: No sore throat.  Positive mouth irritation.  Intermittent nasal congestion. Cardiovascular: Denies chest pain. Respiratory: Denies shortness of breath. Gastrointestinal: No abdominal pain.  Musculoskeletal: Negative for back pain. Skin: Negative for rash.   ____________________________________________   PHYSICAL EXAM:  VITAL SIGNS: ED Triage Vitals [01/31/19 1048]  Enc Vitals Group     BP (!) 154/100     Pulse Rate 83     Resp 18     Temp 98.6 F (37 C)     Temp Source Oral     SpO2 99 %     Weight 250 lb (113.4 kg)     Height 5\' 8"  (1.727 m)     Head Circumference      Peak Flow      Pain Score 4     Pain Loc      Pain Edu?      Excl. in [place]?     Constitutional: Alert and oriented. Well appearing and in no acute distress. Eyes: Conjunctivae are normal.  ENT      Head: Normocephalic and atraumatic.      Nose: No congestion.  No nasal turbinate edema.      Mouth/Throat: Mucous membranes are moist.Oropharynx non-erythematous.  No tonsillar swelling or exudate.  Dorsal tongue mild whitish appearance.  No oral lesions noted.  No oral edema noted. Neck: No stridor. Supple without meningismus.  Hematological/Lymphatic/Immunilogical: No cervical lymphadenopathy. Cardiovascular: Normal rate, regular rhythm. Grossly normal heart sounds.  Good peripheral circulation. Respiratory: Normal respiratory effort without tachypnea nor retractions. Breath sounds are clear and equal bilaterally. No wheezes, rales, rhonchi. Musculoskeletal: Steady gait. Neurologic:  Normal speech and language.  Speech is normal. No gait instability.  Skin:  Skin is warm, dry and intact. No rash noted. Psychiatric: Mood and affect are normal. Speech and behavior are normal. Patient exhibits  appropriate insight and judgment   ___________________________________________   LABS (all labs ordered are listed, but only abnormal results are displayed)  Labs Reviewed - No data to display ____________________________________________   PROCEDURES Procedures    INITIAL IMPRESSION / ASSESSMENT AND PLAN / ED COURSE  Pertinent labs & imaging results that were available during my care of the patient were reviewed by me and considered in my medical decision making (see chart for details).  Very well-appearing patient.  No acute distress.  Suspect allergic rhinitis, and recommend for patient to continue his Claritin and use his home Flonase, inhalers as needed.  Counseled to rinse mouth out post inhaler use.  Intermittent sinusitis, do not suspect current bacterial sinus infection.  Concern for thrush.  Will treat  with clotrimazole lozenges.  Supportive care and close monitoring.Discussed indication, risks and benefits of medications with patient.  Discussed follow up with Primary care physician this week. Discussed follow up and return parameters including no resolution or any worsening concerns. Patient verbalized understanding and agreed to plan.   ____________________________________________   FINAL CLINICAL IMPRESSION(S) / ED DIAGNOSES  Final diagnoses:  Thrush  Allergic rhinitis, unspecified seasonality, unspecified trigger     ED Discharge Orders         Ordered    clotrimazole (MYCELEX) 10 MG troche  5 times daily     01/31/19 1126           Note: This dictation was prepared with Dragon dictation along with smaller phrase technology. Any transcriptional errors that result from this process are unintentional.         Marylene Land, NP 01/31/19 1220

## 2019-03-14 ENCOUNTER — Other Ambulatory Visit: Payer: Self-pay | Admitting: Family Medicine

## 2019-03-14 DIAGNOSIS — J4530 Mild persistent asthma, uncomplicated: Secondary | ICD-10-CM

## 2019-03-25 ENCOUNTER — Ambulatory Visit
Admission: EM | Admit: 2019-03-25 | Discharge: 2019-03-25 | Disposition: A | Discharge status: Home / Self Care | Payer: 59 | Attending: Family Medicine | Admitting: Family Medicine

## 2019-03-25 ENCOUNTER — Other Ambulatory Visit: Payer: Self-pay

## 2019-03-25 DIAGNOSIS — J309 Allergic rhinitis, unspecified: Secondary | ICD-10-CM | POA: Diagnosis not present

## 2019-03-25 MED ORDER — AZELASTINE HCL 0.1 % NA SOLN: 2.0000 | Freq: Two times a day (BID) | NASAL | 3 refills | Status: DC

## 2019-03-25 NOTE — ED Triage Notes (Signed)
Pt with facial pain, post nasal drip, pressure in head. Sx started on Wednesday. Pain 2/10

## 2019-03-25 NOTE — Discharge Instructions (Signed)
Medication as prescribed.  Continue claritin.  Fluids.  Take care  Dr. Lacinda Axon

## 2019-03-26 NOTE — ED Provider Notes (Signed)
[facility]    CSN: [id number] Arrival date & time: 03/25/19  1503   History   Chief Complaint Chief Complaint  Patient presents with  . Sinus Problem   HPI  58 year old male presents with the above complaint.  Patient reports that he has been symptomatic since Wednesday.  Patient states that this is been occurring to him quite often after he travels to [employer] for work.  He states that when he returns home his symptoms improved.  He reports that since Wednesday he has been experiencing "burning in the sinuses", postnasal drip.  He has had some sore throat as well but this has resolved.  He has taken Claritin and honey as well as his home inhalers with improvement.  No fever.  No reports of purulent nasal discharge.  His discomfort is 2/10 in severity.  [place]  No other reported symptoms.  No other complaints.  PMH, Surgical Hx, Family Hx, Social History reviewed and updated as below.  Past Medical History:  Diagnosis Date  . Allergy   . Asthma   . Colon polyp   . GERD (gastroesophageal reflux disease)   . Hyperlipidemia     Patient Active Problem List   Diagnosis Date Noted  . Myalgia due to statin 12/03/2017  . Chronic low back pain 06/21/2017  . Multiple nevi 06/21/2017  . Familial hypertriglyceridemia 04/06/2017  . Plantar fasciitis of right foot 04/05/2017  . Allergic rhinitis due to allergen 09/17/2016  . Dry skin dermatitis 09/09/2016  . Carpal tunnel syndrome, left 06/25/2016  . GERD (gastroesophageal reflux disease) 05/29/2016  . Prediabetes 03/24/2016  . Mild persistent asthma 02/13/2016  . Hyperlipidemia 02/13/2016  . Hypertension 02/13/2016    Past Surgical History:  Procedure Laterality Date  . CLAVICLE EXCISION    . HERNIA REPAIR  2004, 2005  . WRIST ARTHROPLASTY      Home Medications    Prior to Admission medications   Medication Sig Start Date End Date Taking? Authorizing  Provider  ADVAIR DISKUS 250-50 MCG/DOSE AEPB INHALE 1 PUFF INTO LUNGS TWICE DAILY 01/31/19   Parks Ranger, Devonne Doughty, DO  albuterol (VENTOLIN HFA) 108 (90 Base) MCG/ACT inhaler INHALE 2 PUFFS BY MOUTH EVERY 4 TO 6 HOURS AS NEEDED WHEEZING/ SHORTNESS OF BREATH 03/14/19   Karamalegos, Devonne Doughty, DO  aspirin EC 81 MG tablet Take 81 mg by mouth.    [provider]  azelastine (ASTELIN) 0.1 % nasal spray Place 2 sprays into both nostrils 2 (two) times daily. Use in each nostril as directed 03/25/19   Coral Spikes, DO  Elastic Bandages & Supports (MEDICAL COMPRESSION SOCKS) MISC 1 each by Does not apply route daily. 04/16/16   Krebs, Genevie Cheshire, NP  fluticasone (FLONASE) 50 MCG/ACT nasal spray Place 2 sprays into both nostrils daily. Use for 4-6 weeks then stop and use seasonally or as needed. 09/17/16   Parks Ranger, Devonne Doughty, DO  omeprazole (PRILOSEC) 40 MG capsule  02/10/19   [provider]  sildenafil (REVATIO) 20 MG tablet TAKE 1 TO 5 TABLETS BY MOUTH 30 MINUTES PRIOR TO SEX.(START WITH 1 TAB AND INCREASE AS NEEDED 12/07/17   Parks Ranger, Devonne Doughty, DO  ipratropium (ATROVENT) 0.06 % nasal spray Place 2 sprays into both nostrils 4 (four) times daily. For up to 5-7 days then stop. 10/20/17 03/25/19  Olin Hauser, DO    Family History Family History  Problem Relation Age of Onset  .  Cancer Mother        melanoma  . Diabetes Mother   . Hyperlipidemia Mother   . Dementia Mother   . Cancer Father   . Cancer Sister   . Heart disease Sister   . Diabetes Brother   . Hyperlipidemia Brother   . Dementia Paternal Grandmother     Social History Social History   Tobacco Use  . Smoking status: Former Smoker    Quit date: 09/07/2009    Years since quitting: 9.5  . Smokeless tobacco: Former Network engineer Use Topics  . Alcohol use: No  . Drug use: No     Allergies   Statins, Azithromycin, Shellfish-derived products, and Sulfa antibiotics   Review of Systems  Review of Systems  Constitutional: Negative for fever.  HENT: Positive for postnasal drip, sinus pain and sore throat.    Physical Exam Triage Vital Signs ED Triage Vitals  Enc Vitals Group     BP 03/25/19 1516 (!) 152/112     Pulse Rate 03/25/19 1516 77     Resp 03/25/19 1516 20     Temp 03/25/19 1516 97.7 F (36.5 C)     Temp Source 03/25/19 1516 Oral     SpO2 03/25/19 1516 99 %     Weight 03/25/19 1517 246 lb (111.6 kg)     Height 03/25/19 1517 5\' 8"  (1.727 m)     Head Circumference --      Peak Flow --      Pain Score 03/25/19 1517 2     Pain Loc --      Pain Edu? --      Excl. in [place]? --    Updated Vital Signs BP (!) 152/112 (BP Location: Right Arm)   Pulse 77   Temp 97.7 F (36.5 C) (Oral)   Resp 20   Ht 5\' 8"  (1.727 m)   Wt 111.6 kg   SpO2 99%   BMI 37.40 kg/m   Visual Acuity Right Eye Distance:   Left Eye Distance:   Bilateral Distance:    Right Eye Near:   Left Eye Near:    Bilateral Near:     Physical Exam Vitals signs and nursing note reviewed.  Constitutional:      General: He is not in acute distress.    Appearance: Normal appearance. He is obese.  HENT:     Head: Normocephalic and atraumatic.     Right Ear: Tympanic membrane normal.     Left Ear: Tympanic membrane normal.     Mouth/Throat:     Pharynx: Oropharynx is clear. No oropharyngeal exudate.  Eyes:     General:        Right eye: No discharge.        Left eye: No discharge.     Conjunctiva/sclera: Conjunctivae normal.  Cardiovascular:     Rate and Rhythm: Normal rate and regular rhythm.  Pulmonary:     Effort: Pulmonary effort is normal.     Breath sounds: Normal breath sounds. No wheezing, rhonchi or rales.  Neurological:     Mental Status: He is alert.  Psychiatric:        Mood and Affect: Mood normal.        Behavior: Behavior normal.    UC Treatments / Results  Labs (all labs ordered are listed, but only abnormal results are displayed) Labs Reviewed - No data to  display  EKG   Radiology No results found.  Procedures Procedures (including critical care  time)  Medications Ordered in UC Medications - No data to display  Initial Impression / Assessment and Plan / UC Course  I have reviewed the triage vital signs and the nursing notes.  Pertinent labs & imaging results that were available during my care of the patient were reviewed by me and considered in my medical decision making (see chart for details).    58 year old male presents with findings consistent with allergic rhinitis and postnasal drip.  Continued use of Claritin.  Azelastine nasal spray as prescribed.  No evidence of sinusitis at this time.  Supportive care.  Final Clinical Impressions(s) / UC Diagnoses   Final diagnoses:  Allergic rhinitis, unspecified seasonality, unspecified trigger     Discharge Instructions     Medication as prescribed.  Continue claritin.  Fluids.  Take care  Dr. Lacinda Axon    ED Prescriptions    Medication Sig Dispense Auth. Provider   azelastine (ASTELIN) 0.1 % nasal spray Place 2 sprays into both nostrils 2 (two) times daily. Use in each nostril as directed 30 mL Coral Spikes, DO     Controlled Substance Prescriptions [place] Controlled Substance Registry consulted? Not Applicable   Coral Spikes, [place] 03/26/19 5009

## 2019-05-14 ENCOUNTER — Other Ambulatory Visit: Payer: Self-pay | Admitting: Family Medicine

## 2019-05-24 IMAGING — CR DG LUMBAR SPINE 2-3V
1 series · 3 of 3 positions shown · non-contrast
Comparison: None.

CLINICAL DATA: Lifting injury yesterday. Pain extending down the
right leg into the calf.

EXAM:
LUMBAR SPINE - 2-3 VIEW

[Series 1: dg lumbar spine 2-3 views · 0.14mm/px · 3 of 3 slices shown]
[im 1/3]
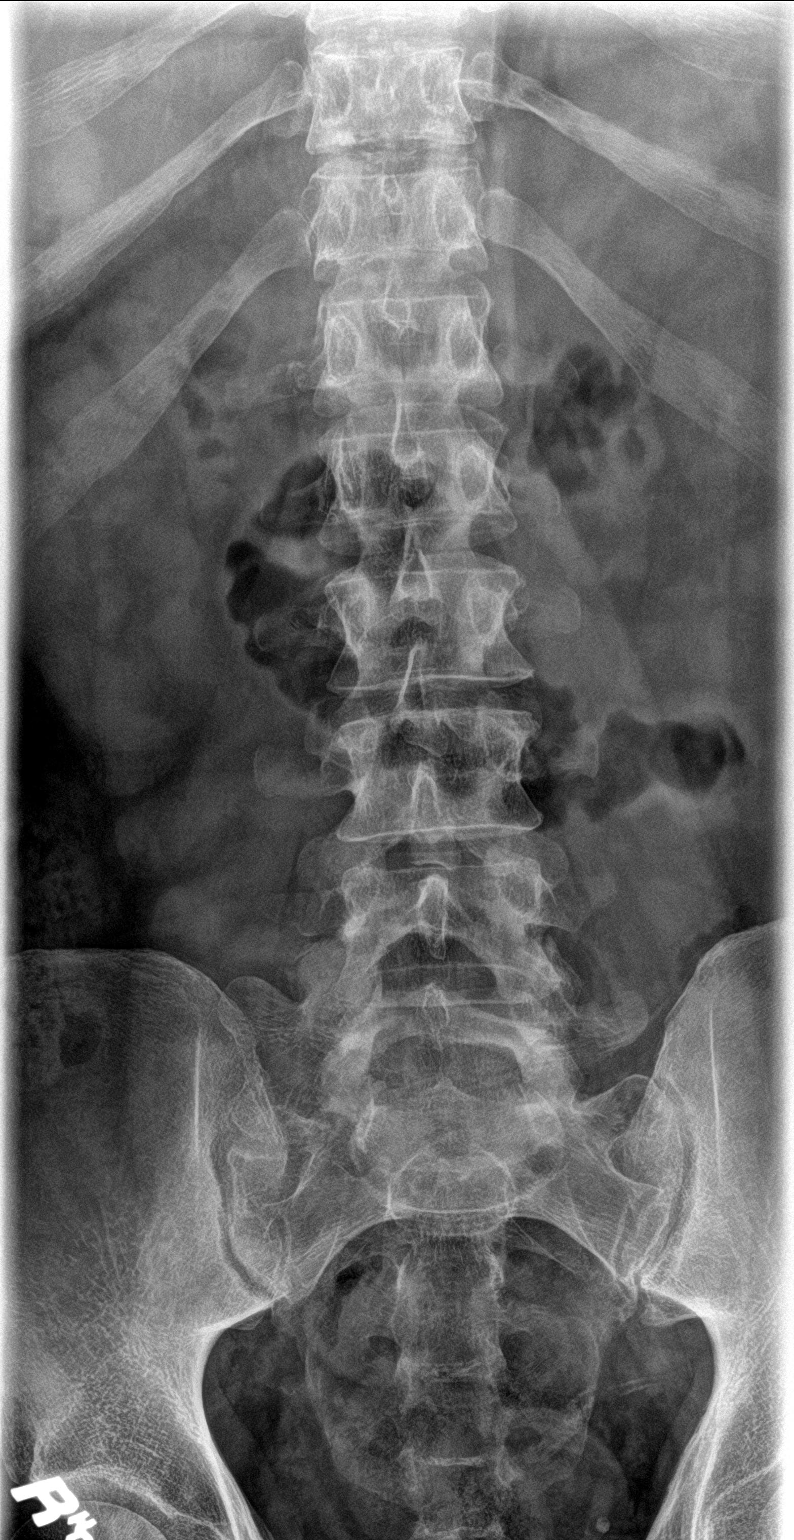
[im 2/3]
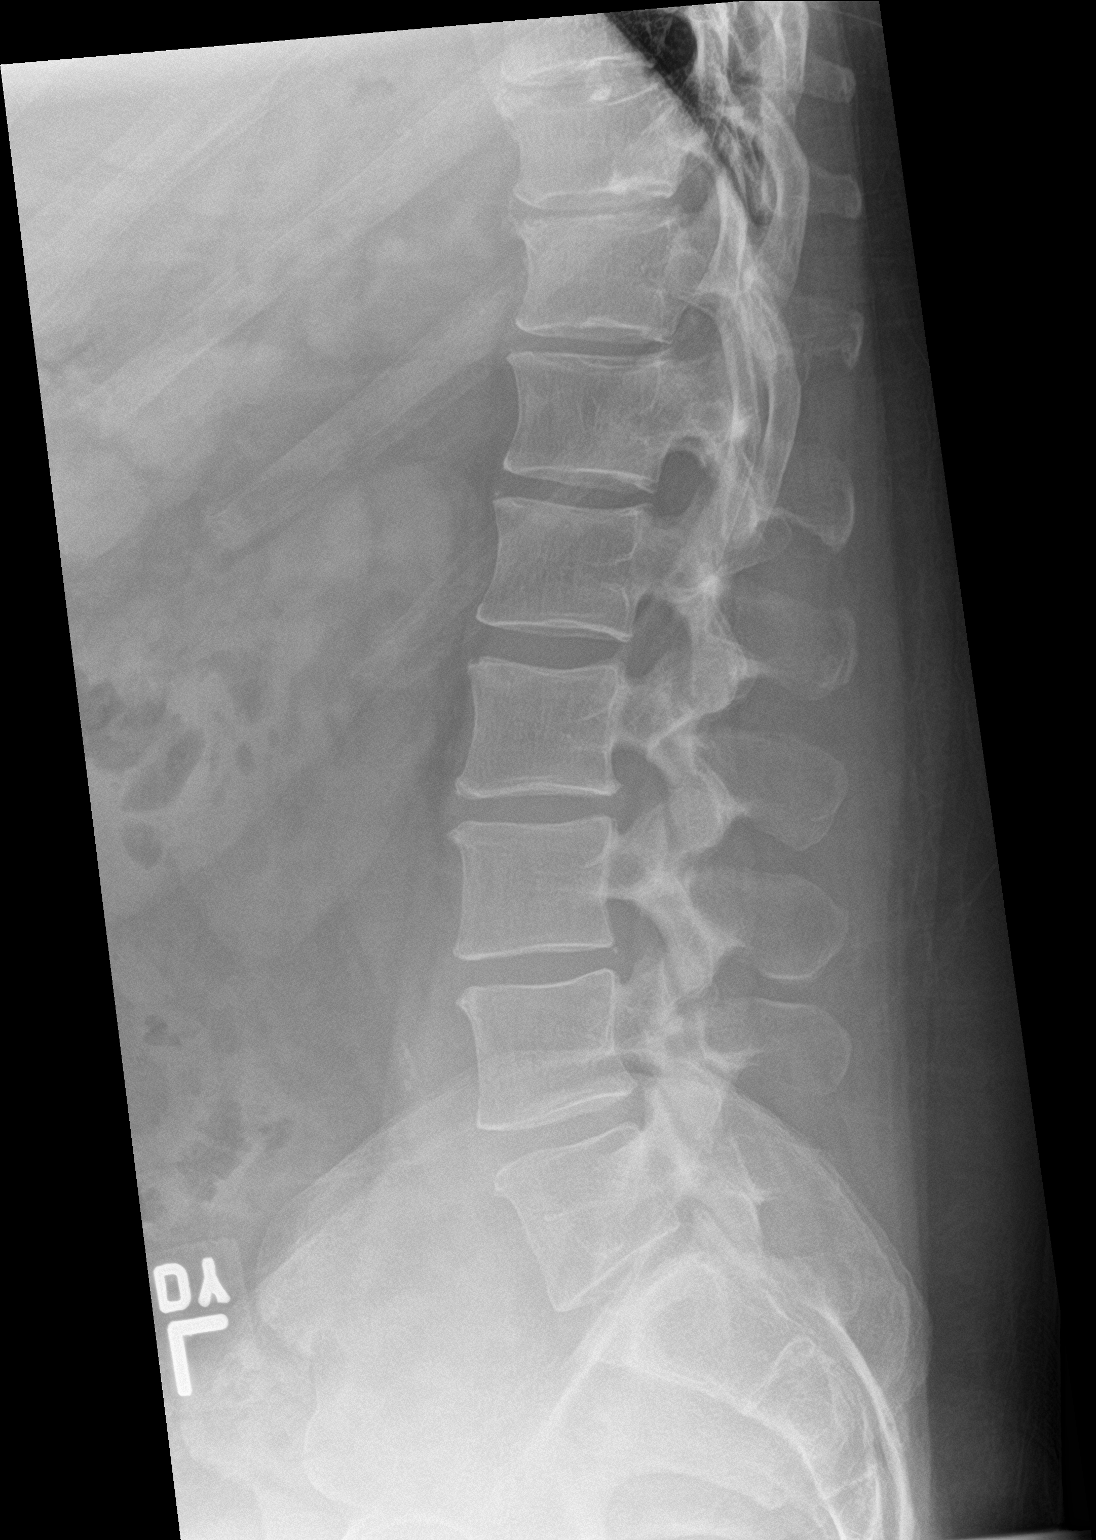
[im 3/3]
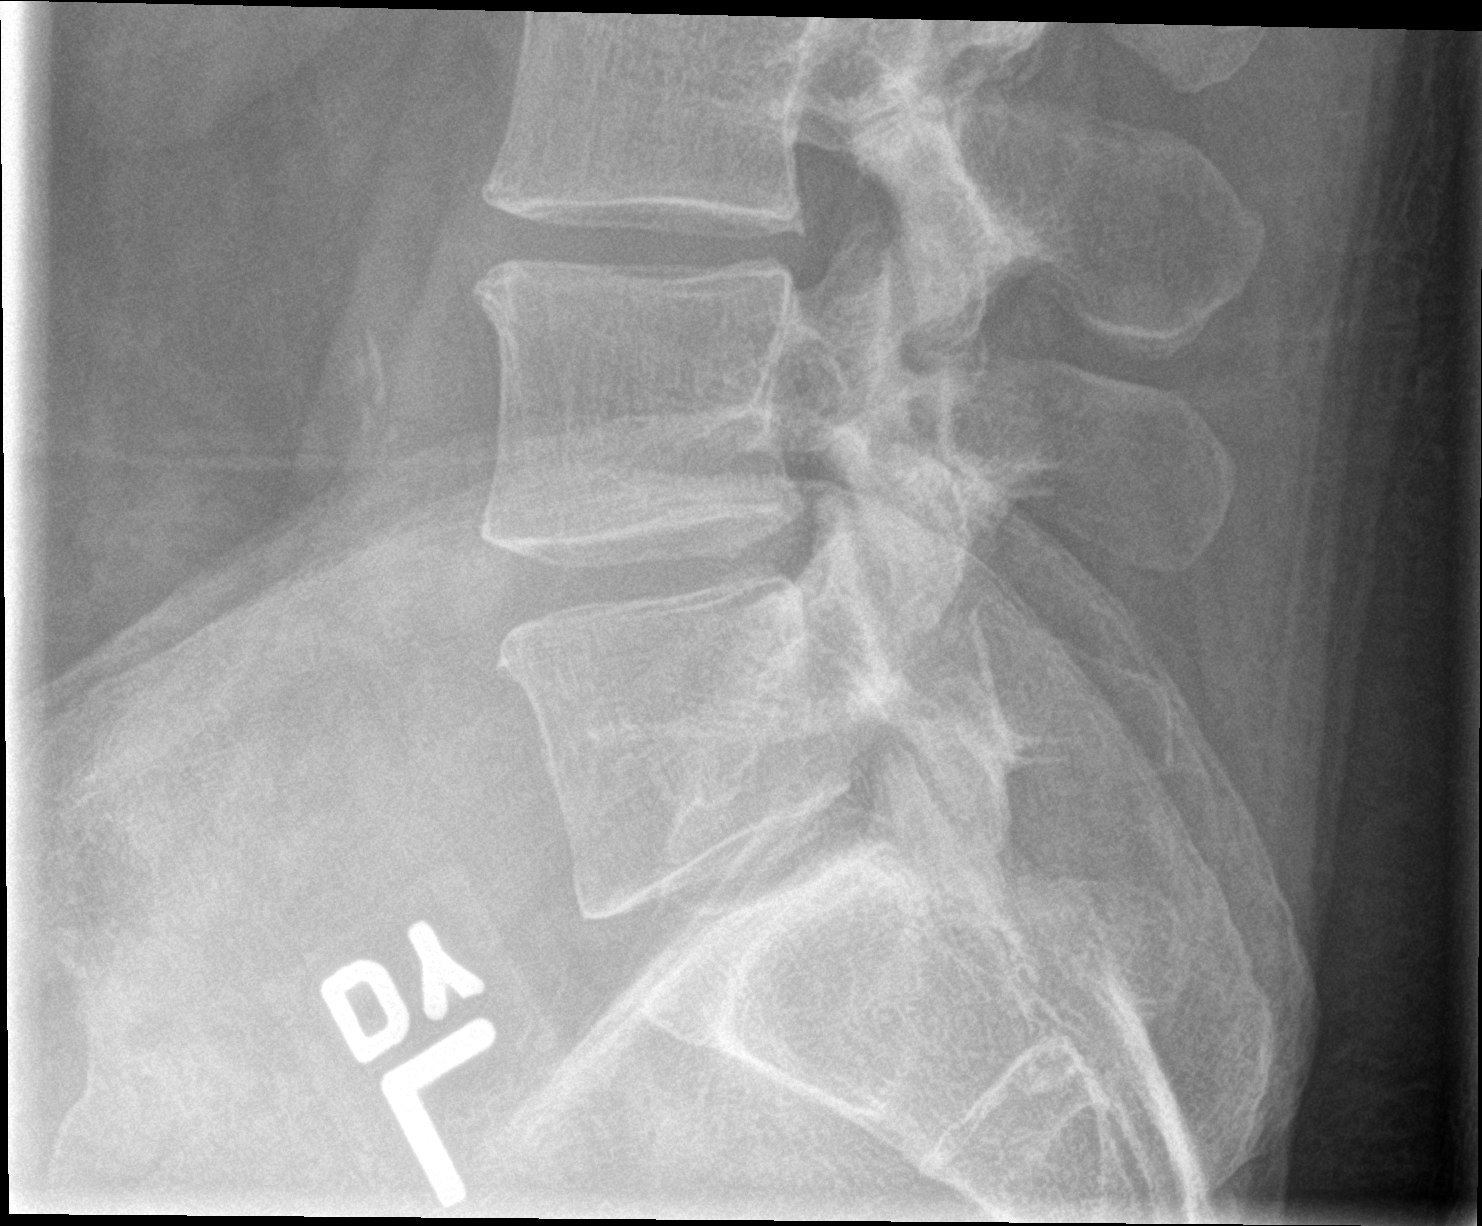

[3 of 3 positions shown; findings below may reference images not displayed]

FINDINGS: There is transitional lumbosacral anatomy without prior spine
imaging available for comparison. For the purposes of this
dictation, the transitional segment will be considered a partially
sacralized L5. The ribs at T12 are hypoplastic.

Vertebral alignment is normal. Vertebral body heights are preserved
without evidence of fracture. At most minimal disc space scratched
of there is minimal disc space narrowing and minimal spurring from
L2-3 to L4-5. Mild lower thoracic spondylosis is also noted. The
soft tissues are unremarkable.
IMPRESSION: Mild thoracolumbar spondylosis without evidence of acute osseous
abnormality.

## 2019-06-23 ENCOUNTER — Ambulatory Visit: Payer: PRIVATE HEALTH INSURANCE | Admitting: Nurse Practitioner

## 2019-09-18 ENCOUNTER — Other Ambulatory Visit: Payer: Self-pay | Admitting: Family Medicine

## 2019-09-18 DIAGNOSIS — J4530 Mild persistent asthma, uncomplicated: Secondary | ICD-10-CM

## 2019-10-19 ENCOUNTER — Ambulatory Visit: Payer: PRIVATE HEALTH INSURANCE | Admitting: Family Medicine

## 2019-12-15 ENCOUNTER — Other Ambulatory Visit: Payer: Self-pay | Admitting: Family Medicine

## 2019-12-15 ENCOUNTER — Encounter: Payer: Self-pay | Admitting: Family Medicine

## 2019-12-15 ENCOUNTER — Ambulatory Visit: Payer: PRIVATE HEALTH INSURANCE | Admitting: Family Medicine

## 2019-12-15 ENCOUNTER — Other Ambulatory Visit: Payer: Self-pay

## 2019-12-15 VITALS — BP 138/80 | HR 90 | Temp 97.8°F | Resp 16 | Ht 66.0 in | Wt 250.6 lb

## 2019-12-15 DIAGNOSIS — R7303 Prediabetes: Secondary | ICD-10-CM | POA: Diagnosis not present

## 2019-12-15 DIAGNOSIS — Z20822 Contact with and (suspected) exposure to covid-19: Secondary | ICD-10-CM | POA: Diagnosis not present

## 2019-12-15 DIAGNOSIS — Z6841 Body Mass Index (BMI) 40.0 and over, adult: Secondary | ICD-10-CM

## 2019-12-15 DIAGNOSIS — I10 Essential (primary) hypertension: Secondary | ICD-10-CM

## 2019-12-15 DIAGNOSIS — J4530 Mild persistent asthma, uncomplicated: Secondary | ICD-10-CM

## 2019-12-15 MED ORDER — FLUTICASONE-SALMETEROL 100-50 MCG/DOSE IN AEPB: 1.0000 | INHALATION_SPRAY | Freq: Two times a day (BID) | RESPIRATORY_TRACT | 3 refills | Status: DC

## 2019-12-15 NOTE — Progress Notes (Signed)
Subjective:    Patient ID: Derek Cook, male    DOB: 23-Dec-1960, 59 y.o.   MRN: [id number]  Derek Cook is a 59 y.o. male presenting on 12/15/2019 for Abdominal Pain (exhaustion, lethatgic, intermitten pain from past couple of weeks)   HPI   Elevated BP / Morbid Obesity BMI >40 Recent elevated BP, limited home readings. Usually improves on re-check Affected by sleep, diet, and stress says these are affecting him lately Admits weight gain, and unable to lose weight Has tried weight watchers and other lifestyle interventions, diet regimen in past Never tried rx medication for weight loss  Abdominal Bulging He had acute injury 2 weeks ago was doing some heavy lifting in squatting position and some pulling at same time, felt acute pain and "tear" in R abdominal area. Currently without pain but seems to have some fullness and "popping out" - Prior history of inguinal hernia problem in past that repaired 2005 - He is concerned for possibility of an abdominal hernia, asking about this today - Currently no pain.  History of possible COVID19 Infection Reports back in Spring 2020 he had a significant cold or virus that affected him. He is asking for COVID19 antibody blood test today to check status. - He has not received COVID19 vaccine yet. Will consider this  Additional info He has moved back to [place]. Was in [place] out of state on assignments. 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    Depression screen [facility] 2/9 08/10/2018 07/04/2018 11/24/2017  Decreased Interest 0 0 0  Down, Depressed, Hopeless 0 0 0  PHQ - 2 Score 0 0 0    Social History   Tobacco Use  . Smoking status: Former Smoker    Quit date: 09/07/2009    Years since quitting: 10.2  . Smokeless tobacco: Former Network engineer Use Topics  . Alcohol use: No  . Drug use: No    Review of Systems Per HPI unless specifically indicated above     Objective:    BP 138/80 (BP  Location: Left Arm, Cuff Size: Normal)   Pulse 90   Temp 97.8 F (36.6 C) (Temporal)   Resp 16   Ht 5\' 6"  (1.676 m)   Wt 250 lb 9.6 oz (113.7 kg)   SpO2 98%   BMI 40.45 kg/m   Wt Readings from Last 3 Encounters:  12/15/19 250 lb 9.6 oz (113.7 kg)  03/25/19 246 lb (111.6 kg)  01/31/19 250 lb (113.4 kg)    Physical Exam Vitals and nursing note reviewed.  Constitutional:      General: He is not in acute distress.    Appearance: He is well-developed. He is obese. He is not diaphoretic.     Comments: Well-appearing, comfortable, cooperative  HENT:     Head: Normocephalic and atraumatic.  Eyes:     General:        Right eye: No discharge.        Left eye: No discharge.     Conjunctiva/sclera: Conjunctivae normal.  Neck:     Thyroid: No thyromegaly.  Cardiovascular:     Rate and Rhythm: Normal rate and regular rhythm.     Heart sounds: Normal heart sounds. No murmur.  Pulmonary:     Effort: Pulmonary effort is normal. No respiratory distress.     Breath sounds: Normal breath sounds. No wheezing or rales.  Abdominal:     General: Bowel sounds are  normal.     Tenderness: There is no abdominal tenderness. There is no guarding or rebound. Negative signs include Murphy's sign and McBurney's sign.     Hernia: No hernia (No appreciable herniation of abdominal ventral wall or umbilicus) is present.  Musculoskeletal:        General: Normal range of motion.     Cervical back: Normal range of motion and neck supple.  Lymphadenopathy:     Cervical: No cervical adenopathy.  Skin:    General: Skin is warm and dry.     Findings: No erythema or rash.  Neurological:     Mental Status: He is alert and oriented to person, place, and time.  Psychiatric:        Behavior: Behavior normal.     Comments: Well groomed, good eye contact, normal speech and thoughts       Results for orders placed or performed in visit on 07/04/18  HM COLONOSCOPY  Result Value Ref Range   HM Colonoscopy See  Report (in chart) See Report (in chart), Patient Reported      Assessment & Plan:   Problem List Items Addressed This Visit    Prediabetes   Relevant Orders   Hemoglobin A1c   Hypertension    Other Visit Diagnoses    Morbid obesity with BMI of 40.0-44.9, adult ([place])    -  Primary   Exposure to COVID-19 virus       Relevant Orders   SAR CoV2 Serology (COVID 19)AB(IGG)IA      #PreDM / Morbid Obesity BMI >40 Last trend A1c 5.9 (2017) down to 5.7 (2017) then up to 5.9 (11/2017) Overdue for A1c now Unable to lose weight  Discussion on weight loss, lifestyle, diet exercise and medication options Check A1c today for determine status if PreDM or if elevated sugar further at risk of T2DM  Reviewed med options such as GLP1 therapy or consider Contrave for weight loss appetite Handout given, reviewed cost/coverage, he can check insurance, f/u with [place] based on which option he prefers Will see A1c result first, if indicated can rx GLP1 therapy and can trial sample if need.  #Abdominal bulging Recent acute injury, reported bulging but has not bothered him recently. May have been a muscle strain No evidence of abdominal wall hernia on exam today, reassuring No further treatment. Reassurance. Avoid heavy lifting. Future may warrant imaging or consultation with Gen Surg if recurrence or worsening  #CoVID19 Antibody test - check with prior history. Advised, may not be reliable, should proceed w vaccination when ready.   No orders of the defined types were placed in this encounter.    Follow up plan: Return in about 3 months (around 03/15/2020) for 3 month PreDM A1c, Weight, HTN.   [name], [facility] 12/15/2019, 10:13 AM

## 2019-12-15 NOTE — Patient Instructions (Addendum)
Thank you for coming to the office today.  Call insurance find cost and coverage of the following  Trulicity (Dulaglutide) - once weekly - this is very good one, usually one of my top choices as well, two doses, 0.75 (likely we would start) and 1.5 max dose. We can use coupon card here too  Ozempic (Semaglutide injection) - start 0.25mg  weekly for 4 weeks then increase to 0.5mg  weekly   Bydureon BCise (Exenatide ER) - once weekly - this is my preference, very good medicine well tolerated, less side effects of nausea, upset stomach. No dose changes. Cost and coverage is the problem, but we may be able to get it with the coupon card  Have samples on these.  ----------------------------  Contrave is an option for weight loss pill. This one can try to get covered if not then it is about $175 for 1 month, can use for up to 3 months.  I don't feel any abdominal hernia today. Can revisit this with imaging or referral to gen surgery in future.  Most likely abdominal wall strain.   Please schedule a Follow-up Appointment to: Return in about 3 months (around 03/15/2020) for 3 month PreDM A1c, Weight, HTN.  If you have any other questions or concerns, please feel free to call the office or send a message through [place]. You may also schedule an earlier appointment if necessary.  Additionally, you may be receiving a survey about your experience at our office within a few days to 1 week by e-mail or mail. We value your feedback.  [name], DO [place]

## 2019-12-18 LAB — HEMOGLOBIN A1C
Hgb A1c MFr Bld: 5.8 % of total Hgb — ABNORMAL HIGH (ref <5.7)
Mean Plasma Glucose: 120 (calc)
eAG (mmol/L): 6.6 (calc)

## 2019-12-18 LAB — SARS-COV-2 ANTIBODY(IGG)SPIKE,SEMI-QUANTITATIVE: SARS COV1 AB(IGG)SPIKE,SEMI QN: <1 index (ref <1.00)

## 2020-02-06 ENCOUNTER — Other Ambulatory Visit: Payer: Self-pay | Admitting: Family Medicine

## 2020-02-06 NOTE — Telephone Encounter (Signed)
Requested Prescriptions  Pending Prescriptions Disp Refills  . omeprazole (PRILOSEC) 40 MG capsule [Pharmacy Med Name: OMEPRAZOLE DR 40 MG CAP] 90 capsule 1    Sig: TAKE 1 CAPSULE BY MOUTH ONCE DAILY     Gastroenterology: Proton Pump Inhibitors Passed - 02/06/2020  9:23 AM      Passed - Valid encounter within last 12 months    Recent Outpatient Visits          1 month ago Morbid obesity with BMI of 40.0-44.9, adult [facility])   [place], DO   1 year ago Bilateral hearing loss due to cerumen impaction   [place], DO   1 year ago Mass of right testicle   [place], DO   2 years ago Annual physical exam   [place], DO   2 years ago Acute non-recurrent frontal sinusitis   [place], Devonne Doughty, [place]

## 2020-04-03 ENCOUNTER — Other Ambulatory Visit: Payer: Self-pay | Admitting: Family Medicine

## 2020-04-03 DIAGNOSIS — J4530 Mild persistent asthma, uncomplicated: Secondary | ICD-10-CM

## 2020-04-10 ENCOUNTER — Ambulatory Visit
Admission: EM | Admit: 2020-04-10 | Discharge: 2020-04-10 | Disposition: A | Discharge status: Home / Self Care | Payer: 59 | Attending: Internal Medicine | Admitting: Internal Medicine

## 2020-04-10 ENCOUNTER — Encounter: Payer: Self-pay | Admitting: Emergency Medicine

## 2020-04-10 ENCOUNTER — Other Ambulatory Visit: Payer: Self-pay

## 2020-04-10 DIAGNOSIS — Z7982 Long term (current) use of aspirin: Secondary | ICD-10-CM | POA: Insufficient documentation

## 2020-04-10 DIAGNOSIS — Z7951 Long term (current) use of inhaled steroids: Secondary | ICD-10-CM | POA: Diagnosis not present

## 2020-04-10 DIAGNOSIS — Z888 Allergy status to other drugs, medicaments and biological substances status: Secondary | ICD-10-CM | POA: Diagnosis not present

## 2020-04-10 DIAGNOSIS — E781 Pure hyperglyceridemia: Secondary | ICD-10-CM | POA: Diagnosis not present

## 2020-04-10 DIAGNOSIS — Z8249 Family history of ischemic heart disease and other diseases of the circulatory system: Secondary | ICD-10-CM | POA: Diagnosis not present

## 2020-04-10 DIAGNOSIS — J209 Acute bronchitis, unspecified: Secondary | ICD-10-CM | POA: Insufficient documentation

## 2020-04-10 DIAGNOSIS — G5602 Carpal tunnel syndrome, left upper limb: Secondary | ICD-10-CM | POA: Diagnosis not present

## 2020-04-10 DIAGNOSIS — Z882 Allergy status to sulfonamides status: Secondary | ICD-10-CM | POA: Insufficient documentation

## 2020-04-10 DIAGNOSIS — E785 Hyperlipidemia, unspecified: Secondary | ICD-10-CM | POA: Insufficient documentation

## 2020-04-10 DIAGNOSIS — K219 Gastro-esophageal reflux disease without esophagitis: Secondary | ICD-10-CM | POA: Diagnosis not present

## 2020-04-10 DIAGNOSIS — I10 Essential (primary) hypertension: Secondary | ICD-10-CM | POA: Insufficient documentation

## 2020-04-10 DIAGNOSIS — M545 Low back pain: Secondary | ICD-10-CM | POA: Insufficient documentation

## 2020-04-10 DIAGNOSIS — Z8719 Personal history of other diseases of the digestive system: Secondary | ICD-10-CM | POA: Diagnosis not present

## 2020-04-10 DIAGNOSIS — Z79899 Other long term (current) drug therapy: Secondary | ICD-10-CM | POA: Insufficient documentation

## 2020-04-10 DIAGNOSIS — Z20822 Contact with and (suspected) exposure to covid-19: Secondary | ICD-10-CM | POA: Diagnosis not present

## 2020-04-10 DIAGNOSIS — G8929 Other chronic pain: Secondary | ICD-10-CM | POA: Insufficient documentation

## 2020-04-10 DIAGNOSIS — Z881 Allergy status to other antibiotic agents status: Secondary | ICD-10-CM | POA: Insufficient documentation

## 2020-04-10 DIAGNOSIS — R7303 Prediabetes: Secondary | ICD-10-CM | POA: Insufficient documentation

## 2020-04-10 DIAGNOSIS — J4530 Mild persistent asthma, uncomplicated: Secondary | ICD-10-CM | POA: Diagnosis not present

## 2020-04-10 DIAGNOSIS — Z87891 Personal history of nicotine dependence: Secondary | ICD-10-CM | POA: Diagnosis not present

## 2020-04-10 MED ORDER — GUAIFENESIN ER 600 MG PO TB12: 600.0000 mg | ORAL_TABLET | Freq: Two times a day (BID) | ORAL | 0 refills | Status: AC

## 2020-04-10 MED ORDER — BENZONATATE 100 MG PO CAPS: 100.0000 mg | ORAL_CAPSULE | Freq: Three times a day (TID) | ORAL | 0 refills | Status: DC

## 2020-04-10 NOTE — ED Triage Notes (Signed)
Patient c/o cough, nasal congestion, watery eyes, and sneezing that started 3 days ago.

## 2020-04-10 NOTE — ED Provider Notes (Signed)
[facility]    CSN: [id number] Arrival date & time: 04/10/20  1344      History   Chief Complaint Chief Complaint  Patient presents with  . Cough  . Nasal Congestion    HPI Derek Cook is a 59 y.o. male comes to the urgent care with a 3-day history of nasal congestion, sore throat, productive cough, watering eyes and sneezing.  Patient has been taking care of her father-in-law who had similar symptoms sometime last week.  He denies any fever or chills.  No loss of taste or smell.  No nausea, vomiting or diarrhea.  Patient is not vaccinated against COVID-19 virus.  No dizziness, near syncope or syncopal episodes.  Patient denies any shortness of breath.  He has some chest congestion with scant sputum production.  Sputum has a yellowish tinge to it.Marland Kitchen   HPI  Past Medical History:  Diagnosis Date  . Allergy   . Asthma   . Colon polyp   . GERD (gastroesophageal reflux disease)   . Hyperlipidemia     Patient Active Problem List   Diagnosis Date Noted  . Myalgia due to statin 12/03/2017  . Chronic low back pain 06/21/2017  . Multiple nevi 06/21/2017  . Familial hypertriglyceridemia 04/06/2017  . Plantar fasciitis of right foot 04/05/2017  . Allergic rhinitis due to allergen 09/17/2016  . Dry skin dermatitis 09/09/2016  . Carpal tunnel syndrome, left 06/25/2016  . GERD (gastroesophageal reflux disease) 05/29/2016  . Prediabetes 03/24/2016  . Mild persistent asthma 02/13/2016  . Hyperlipidemia 02/13/2016  . Hypertension 02/13/2016    Past Surgical History:  Procedure Laterality Date  . CLAVICLE EXCISION    . HERNIA REPAIR  2004, 2005  . WRIST ARTHROPLASTY         Home Medications    Prior to Admission medications   Medication Sig Start Date End Date Taking? Authorizing Provider  albuterol (VENTOLIN HFA) 108 (90 Base) MCG/ACT inhaler INHALE 2 PUFFS BY MOUTH EVERY 4 TO 6 HOURS AS NEEDED WHEEZING/ SHORTNESS OF BREATH 04/03/20  Yes Karamalegos, Devonne Doughty, DO  aspirin EC 81 MG tablet Take 81 mg by mouth.   Yes [provider]  Fluticasone-Salmeterol (ADVAIR) 100-50 MCG/DOSE AEPB Inhale 1 puff into the lungs in the morning and at bedtime. 12/15/19  Yes Karamalegos, Devonne Doughty, DO  omeprazole (PRILOSEC) 40 MG capsule TAKE 1 CAPSULE BY MOUTH ONCE DAILY 02/06/20  Yes Karamalegos, Devonne Doughty, DO  sildenafil (REVATIO) 20 MG tablet TAKE 1 TO 5 TABLETS BY MOUTH 30 MINUTES PRIOR TO SEX.(START WITH 1 TAB AND INCREASE AS NEEDED 12/07/17  Yes Karamalegos, Devonne Doughty, DO  benzonatate (TESSALON) 100 MG capsule Take 1 capsule (100 mg total) by mouth every 8 (eight) hours. 04/10/20   Kileigh Ortmann, Myrene Galas, MD  guaiFENesin (MUCINEX) 600 MG 12 hr tablet Take 1 tablet (600 mg total) by mouth 2 (two) times daily for 10 days. 04/10/20 04/20/20  Chase Picket, MD  ipratropium (ATROVENT) 0.06 % nasal spray Place 2 sprays into both nostrils 4 (four) times daily. For up to 5-7 days then stop. 10/20/17 03/25/19  Olin Hauser, DO    Family History Family History  Problem Relation Age of Onset  . Cancer Mother        melanoma  . Diabetes Mother   . Hyperlipidemia Mother   . Dementia Mother   . Cancer Father   . Cancer Sister   . Heart disease Sister   . Diabetes Brother   .  Hyperlipidemia Brother   . Dementia Paternal Grandmother     Social History Social History   Tobacco Use  . Smoking status: Former Smoker    Quit date: 09/07/2009    Years since quitting: 10.5  . Smokeless tobacco: Former Network engineer  . Vaping Use: Never used  Substance Use Topics  . Alcohol use: No  . Drug use: No     Allergies   Statins, Azithromycin, Shellfish-derived products, and Sulfa antibiotics   Review of Systems Review of Systems  Constitutional: Negative.   HENT: Positive for congestion and sore throat. Negative for ear discharge, ear pain, sinus pressure and sinus pain.   Respiratory: Positive for cough and shortness of breath. Negative for chest  tightness and wheezing.   Cardiovascular: Negative.   Gastrointestinal: Negative.   Genitourinary: Negative.   Musculoskeletal: Positive for myalgias.  Neurological: Positive for headaches. Negative for dizziness, seizures and light-headedness.     Physical Exam Triage Vital Signs ED Triage Vitals  Enc Vitals Group     BP 04/10/20 1407 (!) 143/77     Pulse Rate 04/10/20 1407 91     Resp 04/10/20 1407 18     Temp 04/10/20 1407 98.2 F (36.8 C)     Temp Source 04/10/20 1407 Oral     SpO2 04/10/20 1407 98 %     Weight 04/10/20 1409 250 lb (113.4 kg)     Height 04/10/20 1409 5\' 8"  (1.727 m)     Head Circumference --      Peak Flow --      Pain Score 04/10/20 1409 0     Pain Loc --      Pain Edu? --      Excl. in [place]? --    No data found.  Updated Vital Signs BP (!) 143/77 (BP Location: Right Arm)   Pulse 91   Temp 98.2 F (36.8 C) (Oral)   Resp 18   Ht 5\' 8"  (1.727 m)   Wt 113.4 kg   SpO2 98%   BMI 38.01 kg/m   Visual Acuity Right Eye Distance:   Left Eye Distance:   Bilateral Distance:    Right Eye Near:   Left Eye Near:    Bilateral Near:     Physical Exam Vitals and nursing note reviewed.  Constitutional:      General: He is not in acute distress.    Appearance: He is not ill-appearing.  HENT:     Right Ear: Tympanic membrane normal.     Left Ear: Tympanic membrane normal.     Mouth/Throat:     Mouth: Mucous membranes are moist.     Pharynx: Posterior oropharyngeal erythema present.  Eyes:     Extraocular Movements: Extraocular movements intact.     Conjunctiva/sclera: Conjunctivae normal.  Cardiovascular:     Rate and Rhythm: Normal rate and regular rhythm.     Pulses: Normal pulses.  Pulmonary:     Effort: Pulmonary effort is normal.     Breath sounds: Normal breath sounds. No stridor. No wheezing or rhonchi.  Abdominal:     General: Bowel sounds are normal.     Palpations: Abdomen is soft.  Neurological:     Mental Status: He is alert.       UC Treatments / Results  Labs (all labs ordered are listed, but only abnormal results are displayed) Labs Reviewed  SARS CORONAVIRUS 2 (TAT 6-24 HRS)    EKG   Radiology No results found.  Procedures Procedures (including critical care time)  Medications Ordered in UC Medications - No data to display  Initial Impression / Assessment and Plan / UC Course  I have reviewed the triage vital signs and the nursing notes.  Pertinent labs & imaging results that were available during my care of the patient were reviewed by me and considered in my medical decision making (see chart for details).     1.  Acute bronchitis likely viral etiology: COVID-19 PCR test sent COVID-19 vaccination counseling given Tessalon Perles as needed for cough Humibid as needed for sputum production Tylenol as needed for body aches and/or fever Return precautions given Patient is advised to quarantine until COVID-19 test results are available.  Final Clinical Impressions(s) / UC Diagnoses   Final diagnoses:  Acute bronchitis, unspecified organism   Discharge Instructions   None    ED Prescriptions    Medication Sig Dispense Auth. Provider   benzonatate (TESSALON) 100 MG capsule Take 1 capsule (100 mg total) by mouth every 8 (eight) hours. 21 capsule [place] Moss, Myrene Galas, MD   guaiFENesin (MUCINEX) 600 MG 12 hr tablet Take 1 tablet (600 mg total) by mouth 2 (two) times daily for 10 days. 20 tablet Imir Brumbach, Myrene Galas, MD     PDMP not reviewed this encounter.   Chase Picket, MD 04/10/20 [phone]

## 2020-04-11 ENCOUNTER — Telehealth: Payer: Self-pay

## 2020-04-11 LAB — SARS CORONAVIRUS 2 (TAT 6-24 HRS): SARS Coronavirus 2: NEGATIVE

## 2020-04-11 NOTE — Telephone Encounter (Signed)
Pt calls in for COVID test results. Instructed results are negative and to continue to monitor and treat his symptoms. Verbalized understanding.

## 2020-04-12 ENCOUNTER — Telehealth: Payer: Self-pay

## 2020-04-12 NOTE — Telephone Encounter (Signed)
Copied from [place] [id number]. Topic: General - Other >> Apr 12, 2020  9:59 AM [name] wrote: Patient went to urgent care yesterday and found out he has bronchitis. Patient wants to know if Dr. Raliegh Ip can prescribe an antibiotic to send in today. Please advise

## 2020-04-12 NOTE — Telephone Encounter (Signed)
Please notify patient that I have reviewed his [facility] visit from 04/10/20.  I would recommend keeping on current treatment that they advised for up to 5-7 days, and if he is still not improving, he may call to schedule a virtual visit by telephone or video next week early, like Monday or Tuesday and we can evaluate him and may treat if needed.  [name], [facility] 04/12/2020, 12:46 PM

## 2020-04-12 NOTE — Telephone Encounter (Signed)
Patient advised.

## 2020-04-12 NOTE — Telephone Encounter (Signed)
Unable to reach the patient  from urgent care Note:  3-day history of nasal congestion, sore throat, productive cough, watering eyes and sneezing.  Patient has been taking care of her father-in-law who had similar symptoms sometime last week. Don't know why they could not Rx but did not reach the patient ?

## 2020-04-18 ENCOUNTER — Telehealth: Payer: Self-pay | Admitting: Family Medicine

## 2020-04-18 NOTE — Telephone Encounter (Signed)
Recent chart review.  He has a history of asthma. He requested an Albuterol inhaler refill on 04/03/20 it was sent to his pharmacy.  [facility] visit 04/10/20, diagnosed with bronchitis / virus. No antibiotic at that time.  He called [place] on 04/12/20 for antibiotic, and we advised him to wait at least 7 days after Urgent Care Visit and then he can schedule a Virtual / Video visit and follow-up with me to determine if we need to treat him otherwise.  Now 8 days later from his visit he is calling and requesting antibiotic and inhaler.  Could you call him back to notify him that I have already ordered the Albuterol rescue inhaler on 04/03/20? Clarify that he picked it up at that time and if he's using it - there should be 2 refills available.  Also - I still would not be able to order antibiotics for him without evaluating him.  He needs a virtual visit / video visit first and we can evaluate and treat based on his history and symptoms at that time.  [name], [facility] 04/18/2020, 2:35 PM

## 2020-04-18 NOTE — Telephone Encounter (Signed)
Pt is requesting an antibiotic/inhaler. Not feeling better since last visit, please advise   [place], [address]  [place] [address]  Phone: [phone] Fax: [phone]

## 2020-04-19 ENCOUNTER — Other Ambulatory Visit: Payer: Self-pay

## 2020-04-19 ENCOUNTER — Other Ambulatory Visit: Payer: Self-pay | Admitting: Family Medicine

## 2020-04-19 ENCOUNTER — Telehealth (INDEPENDENT_AMBULATORY_CARE_PROVIDER_SITE_OTHER): Payer: PRIVATE HEALTH INSURANCE | Admitting: Family Medicine

## 2020-04-19 ENCOUNTER — Encounter: Payer: Self-pay | Admitting: Family Medicine

## 2020-04-19 DIAGNOSIS — I10 Essential (primary) hypertension: Secondary | ICD-10-CM

## 2020-04-19 DIAGNOSIS — R7303 Prediabetes: Secondary | ICD-10-CM

## 2020-04-19 DIAGNOSIS — E7801 Familial hypercholesterolemia: Secondary | ICD-10-CM

## 2020-04-19 DIAGNOSIS — Z125 Encounter for screening for malignant neoplasm of prostate: Secondary | ICD-10-CM

## 2020-04-19 DIAGNOSIS — J4531 Mild persistent asthma with (acute) exacerbation: Secondary | ICD-10-CM | POA: Diagnosis not present

## 2020-04-19 DIAGNOSIS — R7989 Other specified abnormal findings of blood chemistry: Secondary | ICD-10-CM

## 2020-04-19 DIAGNOSIS — J0110 Acute frontal sinusitis, unspecified: Secondary | ICD-10-CM

## 2020-04-19 DIAGNOSIS — Z1159 Encounter for screening for other viral diseases: Secondary | ICD-10-CM

## 2020-04-19 DIAGNOSIS — E559 Vitamin D deficiency, unspecified: Secondary | ICD-10-CM

## 2020-04-19 DIAGNOSIS — E538 Deficiency of other specified B group vitamins: Secondary | ICD-10-CM

## 2020-04-19 DIAGNOSIS — E781 Pure hyperglyceridemia: Secondary | ICD-10-CM

## 2020-04-19 MED ORDER — BENZONATATE 100 MG PO CAPS: 100.0000 mg | ORAL_CAPSULE | Freq: Three times a day (TID) | ORAL | 0 refills | Status: DC | PRN

## 2020-04-19 MED ORDER — AMOXICILLIN-POT CLAVULANATE 875-125 MG PO TABS: 1.0000 | ORAL_TABLET | Freq: Two times a day (BID) | ORAL | 0 refills | Status: DC

## 2020-04-19 MED ORDER — PREDNISONE 10 MG PO TABS: ORAL_TABLET | ORAL | 0 refills | Status: DC

## 2020-04-19 MED ORDER — FLUTICASONE-SALMETEROL 500-50 MCG/DOSE IN AEPB: 1.0000 | INHALATION_SPRAY | Freq: Two times a day (BID) | RESPIRATORY_TRACT | 5 refills | Status: DC

## 2020-04-19 NOTE — Telephone Encounter (Signed)
Disregard previous message his appointment is scheduled for today 04/19/2020 for virtual.

## 2020-04-19 NOTE — Telephone Encounter (Signed)
Unable to reach the patient left detail message.

## 2020-04-19 NOTE — Progress Notes (Signed)
Virtual Visit via Telephone The purpose of this virtual visit is to provide medical care while limiting exposure to the novel coronavirus (COVID19) for both patient and office staff.  Consent was obtained for phone visit:  Yes.   Answered questions that patient had about telehealth interaction:  Yes.   I discussed the limitations, risks, security and privacy concerns of performing an evaluation and management service by telephone. I also discussed with the patient that there may be a patient responsible charge related to this service. The patient expressed understanding and agreed to proceed.  Patient Location: Home Provider Location: [facility])  ---------------------------------------------------------------------- Chief Complaint  Patient presents with  . Shortness of Breath    need Rx ADVAIR DISKUS 250-50 MCG/DOSE AEPB --sore throat, dizzy while coughing, itchy ears, yellowish mucus, loss of taste or smell but denies fever, has sinusitis onset 2 weeks     S: Reviewed CMA documentation. I have called patient and gathered additional HPI as follows:  Asthma, Mild Persistent - Acute Exacerbation Acute Sinusitis  Reports that symptoms started 2 weeks ago with sinus congestion, drainage and yellow mucus, with sinus infection that worsened after it initially improved, had some loss of smell with sinuses only. Environmental allergies contributing initially Previously former smoker was having issue asthma worse in past. Tried OTC sinus regimen. Urgent Care visit 04/10/20 [facility] - had negative COVID19 test. He is unvaccinated. Treated with tessalon at that time.  Denies any known or suspected exposure to person with or possibly with COVID19.  Admits sinus pressure drainage, occasional cough Denies any fevers, chills, sweats, body ache, headache, abdominal pain, diarrhea  Past Medical History:  Diagnosis Date  . Allergy   . Asthma   . Colon polyp   .  GERD (gastroesophageal reflux disease)   . Hyperlipidemia    Social History   Tobacco Use  . Smoking status: Former Smoker    Quit date: 09/07/2009    Years since quitting: 10.6  . Smokeless tobacco: Former Network engineer  . Vaping Use: Never used  Substance Use Topics  . Alcohol use: No  . Drug use: No    Current Outpatient Medications:  .  albuterol (VENTOLIN HFA) 108 (90 Base) MCG/ACT inhaler, INHALE 2 PUFFS BY MOUTH EVERY 4 TO 6 HOURS AS NEEDED WHEEZING/ SHORTNESS OF BREATH, Disp: 8.5 g, Rfl: 2 .  aspirin EC 81 MG tablet, Take 81 mg by mouth., Disp: , Rfl:  .  guaiFENesin (MUCINEX) 600 MG 12 hr tablet, Take 1 tablet (600 mg total) by mouth 2 (two) times daily for 10 days., Disp: 20 tablet, Rfl: 0 .  omeprazole (PRILOSEC) 40 MG capsule, TAKE 1 CAPSULE BY MOUTH ONCE DAILY, Disp: 90 capsule, Rfl: 1 .  sildenafil (REVATIO) 20 MG tablet, TAKE 1 TO 5 TABLETS BY MOUTH 30 MINUTES PRIOR TO SEX.(START WITH 1 TAB AND INCREASE AS NEEDED, Disp: 20 tablet, Rfl: 2 .  amoxicillin-clavulanate (AUGMENTIN) 875-125 MG tablet, Take 1 tablet by mouth 2 (two) times daily. For 10 days, Disp: 20 tablet, Rfl: 0 .  benzonatate (TESSALON) 100 MG capsule, Take 1 capsule (100 mg total) by mouth 3 (three) times daily as needed for cough., Disp: 30 capsule, Rfl: 0 .  Fluticasone-Salmeterol (ADVAIR) 500-50 MCG/DOSE AEPB, Inhale 1 puff into the lungs 2 (two) times daily., Disp: 60 each, Rfl: 5 .  predniSONE (DELTASONE) 10 MG tablet, Take 6 tabs with breakfast Day 1, 5 tabs Day 2, 4 tabs Day 3, 3 tabs  Day 4, 2 tabs Day 5, 1 tab Day 6., Disp: 21 tablet, Rfl: 0  Depression screen [facility] 2/9 08/10/2018 07/04/2018 11/24/2017  Decreased Interest 0 0 0  Down, Depressed, Hopeless 0 0 0  PHQ - 2 Score 0 0 0    No flowsheet data found.  -------------------------------------------------------------------------- O: No physical exam performed due to remote telephone encounter.  Lab results reviewed.  Recent Results (from  the past 2160 hour(s))  SARS CORONAVIRUS 2 (TAT 6-24 HRS) Nasopharyngeal Nasopharyngeal Swab     Status: None   Collection Time: 04/10/20  2:13 PM   Specimen: Nasopharyngeal Swab  Result Value Ref Range   SARS Coronavirus 2 NEGATIVE NEGATIVE    Comment: (NOTE) SARS-CoV-2 target nucleic acids are NOT DETECTED.  The SARS-CoV-2 RNA is generally detectable in upper and lower respiratory specimens during the acute phase of infection. Negative results do not preclude SARS-CoV-2 infection, do not rule out co-infections with other pathogens, and should not be used as the sole basis for treatment or other patient management decisions. Negative results must be combined with clinical observations, patient history, and epidemiological information. The expected result is Negative.  Fact Sheet for Patients: SugarRoll.be  Fact Sheet for Healthcare Providers: https://www.woods-mathews.com/  This test is not yet approved or cleared by the [place] FDA and  has been authorized for detection and/or diagnosis of SARS-CoV-2 by FDA under an Emergency Use Authorization (EUA). This EUA will remain  in effect (meaning this test can be used) for the duration of the COVID-19 declaration under Se ction 564(b)(1) of the Act, 21 U.S.C. section 360bbb-3(b)(1), unless the authorization is terminated or revoked sooner.  Performed at [facility], [place] [address] [place], [place] [address]     -------------------------------------------------------------------------- A&P:  Problem List Items Addressed This Visit    Mild persistent asthma   Relevant Medications   Fluticasone-Salmeterol (ADVAIR) 500-50 MCG/DOSE AEPB   benzonatate (TESSALON) 100 MG capsule   amoxicillin-clavulanate (AUGMENTIN) 875-125 MG tablet   predniSONE (DELTASONE) 10 MG tablet    Other Visit Diagnoses    Acute non-recurrent frontal sinusitis    -  Primary   Relevant Medications    benzonatate (TESSALON) 100 MG capsule   amoxicillin-clavulanate (AUGMENTIN) 875-125 MG tablet   predniSONE (DELTASONE) 10 MG tablet     Consistent with mild acute asthma exacerbation in setting of mild persistent asthma. Previously well controlled on Advair, however dose coverage changed in past several months from 250-50 down to 100-50 says not as effective, and has recent acute sinusitis flare that has been persistent and recurring now over >2 weeks - He had Urgent Care visit initially presented to [facility] 04/10/20 - [place] test. He is unvaccinated.  Plan: 1. Start Augmentin BID x 10 days for sinusitis 2. Add Prednisone taper over 6 days by his preference instead of burst dose 3. Increase Advair generic from 100-50 to 500-50, since he used to take higher dose 250 in past, this is formulary preferred 4. Continue Albuterol 2 puffs q 4-6 hour PRN wheezing/cough/SOB x 3 days regularly, then PRN 5. Start Tessalon Perls take 1 capsule up to 3 times a day as needed for cough  Offered repeat COVID testing, counseling Future consider CXR if not improve  Return criteria given to follow-up vs when to go to ED   Meds ordered this encounter  Medications  . Fluticasone-Salmeterol (ADVAIR) 500-50 MCG/DOSE AEPB    Sig: Inhale 1 puff into the lungs 2 (two) times daily.  Dispense:  60 each    Refill:  5  . benzonatate (TESSALON) 100 MG capsule    Sig: Take 1 capsule (100 mg total) by mouth 3 (three) times daily as needed for cough.    Dispense:  30 capsule    Refill:  0  . amoxicillin-clavulanate (AUGMENTIN) 875-125 MG tablet    Sig: Take 1 tablet by mouth 2 (two) times daily. For 10 days    Dispense:  20 tablet    Refill:  0  . predniSONE (DELTASONE) 10 MG tablet    Sig: Take 6 tabs with breakfast Day 1, 5 tabs Day 2, 4 tabs Day 3, 3 tabs Day 4, 2 tabs Day 5, 1 tab Day 6.    Dispense:  21 tablet    Refill:  0    Follow-up: - Return in 1 month for lab panel / follow-up Future  labs ordered 05/10/20   Patient verbalizes understanding with the above medical recommendations including the limitation of remote medical advice.  Specific follow-up and call-back criteria were given for patient to follow-up or seek medical care more urgently if needed.   - Time spent in direct consultation with patient on phone: 15 minutes  [name], [facility] 04/19/2020, 10:06 AM

## 2020-05-03 ENCOUNTER — Telehealth: Payer: Self-pay

## 2020-05-03 NOTE — Telephone Encounter (Signed)
Called Derek Cook, approx 640pm 05/03/20 - he says overall felt better but still has had persistent shortness of breath and some respiratory symptoms and fatigue.  I did advise him on his last telemedicine visit on 04/19/20, that he should follow-up and notify us if needs a Chest X-ray or other treatment if not improved.  It has been about 2 weeks since that time. I advised him to go get COVID tested now over weekend, and send [place] the result. Based on his result we can determine if he does a walk in for CXR early next week, OR if he needs a Virtual Visit for COVID + or if he needs a Virtual Visit for [place] and we can arrange swab test here and MAYBE an X-ray.  He will notify us with test results or ready to schedule virtual next week.  [name], [facility] 05/03/2020, 6:44 PM

## 2020-05-03 NOTE — Telephone Encounter (Signed)
Spoke to the patient he has finished Abx and prednisone his Sxs was improved except still has cough and weakness advised some time cough can lingering around for couple of days after sickness, he wants to know can he get another covid test done since past couple of them showed up negative and still has weakness and cough. Also he has appointment for physical lab work next week and video visit for physical should he keep his lab appointment.

## 2020-05-03 NOTE — Telephone Encounter (Signed)
Copied from [place] [phone] [id number]. Topic: General - Inquiry >> May 02, 2020  4:12 PM Gillis Ends D wrote: Reason for CRM: The patient is requesting a chest x-ray because he is still feeling bad, shortness of breathe, and a cough. He wants to also get his antibodies checked. Please advise.

## 2020-05-07 ENCOUNTER — Telehealth: Payer: Self-pay

## 2020-05-07 NOTE — Telephone Encounter (Signed)
Copied from [place] [phone]. Topic: General - Other >> May 07, 2020  2:46 PM Leward Quan A wrote: Reason for CRM: Patient called to inform Dr Raliegh Ip that as of 05/04/20 his symptoms broke and he is feeling better got his taste buds back and can now smell again. Just wanted to let the Dr Know

## 2020-05-07 NOTE — Telephone Encounter (Signed)
Acknowledged update.  He was asked to f/u with [place] this week and determine if we need a Chest X-ray or COVID test or other options.  Will hold for now.  Thanks  [name], [facility] 05/07/2020, 6:08 PM

## 2020-05-10 ENCOUNTER — Other Ambulatory Visit: Payer: PRIVATE HEALTH INSURANCE

## 2020-05-17 ENCOUNTER — Telehealth: Payer: PRIVATE HEALTH INSURANCE | Admitting: Family Medicine

## 2020-06-03 ENCOUNTER — Ambulatory Visit: Payer: PRIVATE HEALTH INSURANCE | Admitting: Family Medicine

## 2020-06-04 ENCOUNTER — Other Ambulatory Visit: Payer: Self-pay | Admitting: Family Medicine

## 2020-06-04 DIAGNOSIS — J4530 Mild persistent asthma, uncomplicated: Secondary | ICD-10-CM

## 2020-06-20 ENCOUNTER — Other Ambulatory Visit: Payer: 59

## 2020-06-20 ENCOUNTER — Ambulatory Visit: Payer: Self-pay

## 2020-06-20 NOTE — Telephone Encounter (Signed)
Pt. Reports he started feeling bad this week. Cough, fatigue, body aches, dizzy,chills. Requesting COVID 19 and flu test. Scheduled for today. Answer Assessment - Initial Assessment Questions 1. COVID-19 DIAGNOSIS: "Who made your Coronavirus (COVID-19) diagnosis?" "Was it confirmed by a positive lab test?" If not diagnosed by a HCP, ask "Are there lots of cases (community spread) where you live?" (See public health department website, if unsure)     No test 2. COVID-19 EXPOSURE: "Was there any known exposure to COVID before the symptoms began?" CDC Definition of close contact: within 6 feet (2 meters) for a total of 15 minutes or more over a 24-hour period.      No 3. ONSET: "When did the COVID-19 symptoms start?"      This week 4. WORST SYMPTOM: "What is your worst symptom?" (e.g., cough, fever, shortness of breath, muscle aches)     Weakness, dizzy, body aches 5. COUGH: "Do you have a cough?" If Yes, ask: "How bad is the cough?"       Yes 6. FEVER: "Do you have a fever?" If Yes, ask: "What is your temperature, how was it measured, and when did it start?"     No 7. RESPIRATORY STATUS: "Describe your breathing?" (e.g., shortness of breath, wheezing, unable to speak)      Congestion 8. BETTER-SAME-WORSE: "Are you getting better, staying the same or getting worse compared to yesterday?"  If getting worse, ask, "In what way?"     Better 9. HIGH RISK DISEASE: "Do you have any chronic medical problems?" (e.g., asthma, heart or lung disease, weak immune system, obesity, etc.)     Asthma 10. PREGNANCY: "Is there any chance you are pregnant?" "When was your last menstrual period?"       n/a 11. OTHER SYMPTOMS: "Do you have any other symptoms?"  (e.g., chills, fatigue, headache, loss of smell or taste, muscle pain, sore throat; new loss of smell or taste especially support the diagnosis of COVID-19)       Chills, cough  Protocols used: CORONAVIRUS (COVID-19) DIAGNOSED OR SUSPECTED-A-AH

## 2020-06-21 ENCOUNTER — Other Ambulatory Visit: Payer: Self-pay

## 2020-06-21 ENCOUNTER — Ambulatory Visit
Admission: EM | Admit: 2020-06-21 | Discharge: 2020-06-21 | Disposition: A | Discharge status: Home / Self Care | Payer: 59 | Attending: Emergency Medicine | Admitting: Emergency Medicine

## 2020-06-21 DIAGNOSIS — R6883 Chills (without fever): Secondary | ICD-10-CM | POA: Diagnosis not present

## 2020-06-21 DIAGNOSIS — B349 Viral infection, unspecified: Secondary | ICD-10-CM | POA: Insufficient documentation

## 2020-06-21 DIAGNOSIS — U071 COVID-19: Secondary | ICD-10-CM | POA: Diagnosis not present

## 2020-06-21 DIAGNOSIS — R197 Diarrhea, unspecified: Secondary | ICD-10-CM | POA: Diagnosis present

## 2020-06-21 LAB — RAPID INFLUENZA A&B ANTIGENS
Influenza A (ARMC): NEGATIVE
Influenza B (ARMC): NEGATIVE

## 2020-06-21 LAB — SARS CORONAVIRUS 2 (TAT 6-24 HRS): SARS Coronavirus 2: POSITIVE — AB

## 2020-06-21 NOTE — ED Provider Notes (Addendum)
[facility]    CSN: [id number] Arrival date & time: 06/21/20  1109      History   Chief Complaint Chief Complaint  Patient presents with  . Generalized Body Aches  . Chills  . Diarrhea    HPI Derek Cook is a 59 y.o. male presenting for 4-day history of chills, body aches, diarrhea, congestion, fatigue and one episode of nausea and vomiting.  He states that he recently traveled to [place].  Denies any known Covid exposure.  Has not been vaccinated for Covid.  Has taken over-the-counter Tylenol.  Admits to feeling very feverish with chills and sweats.  He denies any other symptoms.  He denies sore throat, headaches, neck pain, chest pain, breathing difficulty, ear pain, sinus pain, abdominal pain, blood in stool, dysuria.  Patient has a medical history significant for asthma, allergies, acid reflux, hypertension and prediabetes.  No other complaints or concerns today.  HPI  Past Medical History:  Diagnosis Date  . Allergy   . Asthma   . Colon polyp   . GERD (gastroesophageal reflux disease)   . Hyperlipidemia     Patient Active Problem List   Diagnosis Date Noted  . Myalgia due to statin 12/03/2017  . Chronic low back pain 06/21/2017  . Multiple nevi 06/21/2017  . Familial hypertriglyceridemia 04/06/2017  . Plantar fasciitis of right foot 04/05/2017  . Allergic rhinitis due to allergen 09/17/2016  . Dry skin dermatitis 09/09/2016  . Carpal tunnel syndrome, left 06/25/2016  . GERD (gastroesophageal reflux disease) 05/29/2016  . Prediabetes 03/24/2016  . Mild persistent asthma 02/13/2016  . Hyperlipidemia 02/13/2016  . Hypertension 02/13/2016    Past Surgical History:  Procedure Laterality Date  . CLAVICLE EXCISION    . HERNIA REPAIR  2004, 2005  . WRIST ARTHROPLASTY         Home Medications    Prior to Admission medications   Medication Sig Start Date End Date Taking? Authorizing Provider  albuterol (VENTOLIN HFA) 108 (90 Base) MCG/ACT  inhaler INHALE 2 PUFFS BY MOUTH EVERY 4 TO 6 HOURS AS NEEDED WHEEZING/ SHORTNESS OF BREATH 04/03/20  Yes Karamalegos, Devonne Doughty, DO  aspirin EC 81 MG tablet Take 81 mg by mouth.   Yes [provider]  Fluticasone-Salmeterol (ADVAIR) 500-50 MCG/DOSE AEPB Inhale 1 puff into the lungs 2 (two) times daily. 04/19/20  Yes Karamalegos, Devonne Doughty, DO  omeprazole (PRILOSEC) 40 MG capsule TAKE 1 CAPSULE BY MOUTH ONCE DAILY 02/06/20  Yes Karamalegos, Devonne Doughty, DO  amoxicillin-clavulanate (AUGMENTIN) 875-125 MG tablet Take 1 tablet by mouth 2 (two) times daily. For 10 days 04/19/20   Olin Hauser, DO  benzonatate (TESSALON) 100 MG capsule Take 1 capsule (100 mg total) by mouth 3 (three) times daily as needed for cough. 04/19/20   Karamalegos, Devonne Doughty, DO  predniSONE (DELTASONE) 10 MG tablet Take 6 tabs with breakfast Day 1, 5 tabs Day 2, 4 tabs Day 3, 3 tabs Day 4, 2 tabs Day 5, 1 tab Day 6. 04/19/20   Karamalegos, Alexander J, DO  sildenafil (REVATIO) 20 MG tablet TAKE 1 TO 5 TABLETS BY MOUTH 30 MINUTES PRIOR TO SEX.(START WITH 1 TAB AND INCREASE AS NEEDED 12/07/17   Parks Ranger, Devonne Doughty, DO  ipratropium (ATROVENT) 0.06 % nasal spray Place 2 sprays into both nostrils 4 (four) times daily. For up to 5-7 days then stop. 10/20/17 03/25/19  Olin Hauser, DO    Family History Family History  Problem Relation Age of  Onset  . Cancer Mother        melanoma  . Diabetes Mother   . Hyperlipidemia Mother   . Dementia Mother   . Cancer Father   . Cancer Sister   . Heart disease Sister   . Diabetes Brother   . Hyperlipidemia Brother   . Dementia Paternal Grandmother     Social History Social History   Tobacco Use  . Smoking status: Former Smoker    Quit date: 09/07/2009    Years since quitting: 10.7  . Smokeless tobacco: Former Network engineer  . Vaping Use: Never used  Substance Use Topics  . Alcohol use: No  . Drug use: No     Allergies   Statins,  Azithromycin, Shellfish-derived products, and Sulfa antibiotics   Review of Systems Review of Systems  Constitutional: Positive for chills, diaphoresis and fatigue. Negative for fever.  HENT: Positive for congestion. Negative for rhinorrhea, sinus pressure, sinus pain and sore throat.   Respiratory: Negative for cough and shortness of breath.   Gastrointestinal: Positive for diarrhea, nausea and vomiting. Negative for abdominal pain.  Musculoskeletal: Positive for myalgias.  Neurological: Negative for weakness, light-headedness and headaches.  Hematological: Negative for adenopathy.     Physical Exam Triage Vital Signs ED Triage Vitals  Enc Vitals Group     BP 06/21/20 1134 115/74     Pulse Rate 06/21/20 1134 (!) 106     Resp 06/21/20 1134 16     Temp 06/21/20 1134 99 F (37.2 C)     Temp Source 06/21/20 1134 Oral     SpO2 06/21/20 1134 98 %     Weight 06/21/20 1131 250 lb (113.4 kg)     Height 06/21/20 1131 5\' 8"  (1.727 m)     Head Circumference --      Peak Flow --      Pain Score 06/21/20 1131 3     Pain Loc --      Pain Edu? --      Excl. in [place]? --    No data found.  Updated Vital Signs BP 115/74 (BP Location: Left Arm)   Pulse (!) 106   Temp 99 F (37.2 C) (Oral)   Resp 16   Ht 5\' 8"  (1.727 m)   Wt 250 lb (113.4 kg)   SpO2 98%   BMI 38.01 kg/m       Physical Exam Vitals and nursing note reviewed.  Constitutional:      General: He is not in acute distress.    Appearance: Normal appearance. He is well-developed. He is ill-appearing. He is not toxic-appearing or diaphoretic.  HENT:     Head: Normocephalic and atraumatic.     Right Ear: There is impacted cerumen.     Left Ear: There is impacted cerumen.     Nose: Nose normal.     Mouth/Throat:     Mouth: Mucous membranes are moist.     Pharynx: Uvula midline. No posterior oropharyngeal erythema.     Tonsils: No tonsillar abscesses.  Eyes:     General: No scleral icterus.       Right eye: No  discharge.        Left eye: No discharge.     Conjunctiva/sclera: Conjunctivae normal.  Neck:     Thyroid: No thyromegaly.     Trachea: No tracheal deviation.  Cardiovascular:     Rate and Rhythm: Regular rhythm. Tachycardia present.     Heart sounds: Normal heart sounds.  Pulmonary:     Effort: Pulmonary effort is normal. No respiratory distress.     Breath sounds: Normal breath sounds. No wheezing or rales.  Abdominal:     Palpations: Abdomen is soft.     Tenderness: There is no abdominal tenderness.     Comments: No tenderness on exam.  Patient states "it feels good" when I am palpating his abdomen.  Musculoskeletal:     Cervical back: Normal range of motion and neck supple.  Lymphadenopathy:     Cervical: No cervical adenopathy.  Skin:    General: Skin is warm and dry.     Findings: No rash.  Neurological:     General: No focal deficit present.     Mental Status: He is alert. Mental status is at baseline.     Gait: Gait normal.  Psychiatric:        Mood and Affect: Mood normal.        Behavior: Behavior normal.        Thought Content: Thought content normal.      UC Treatments / Results  Labs (all labs ordered are listed, but only abnormal results are displayed) Labs Reviewed  RAPID INFLUENZA A&B ANTIGENS  SARS CORONAVIRUS 2 (TAT 6-24 HRS)    EKG   Radiology No results found.  Procedures Procedures (including critical care time)  Medications Ordered in UC Medications - No data to display  Initial Impression / Assessment and Plan / UC Course  I have reviewed the triage vital signs and the nursing notes.  Pertinent labs & imaging results that were available during my care of the patient were reviewed by me and considered in my medical decision making (see chart for details).  59 year old male presenting for 4-day history of flulike symptoms including chills, sweats, myalgias, fatigue, diarrhea and one episode of vomiting.  On exam patient appears ill.   Exam is unremarkable otherwise.  No abdominal tenderness and chest is clear to auscultation.  Flu testing negative today.  Covid testing obtained.  CDC guidelines, isolation protocol and ED precaution discussed with patient.  Patient may possibly be interested in MAB therapy if you test positive for Covid.  Has not been vaccinated for Covid and I advised him is a strong possibility he could have Covid.  Advised isolation until results return in for 10 full days after symptom onset if positive.  Suspect viral illness, Covid not ruled out.  Advised increasing rest and fluid intake.  Advised over-the-counter Imodium and or Pepto-Bismol.  Can continue Tylenol as needed for possible fevers.  Advised her to follow-up with our department or ER if he develops any new or worsening symptoms including weakness, increased vomiting or diarrhea, abdominal pain, chest pain or shortness of breath.  Patient agreeable.  06/22/20: Patient has a positive for COVID-19.  Sent a secure message to the infusion clinic for MAB therapy.  Notify patient of results.  He is doing okay with the same symptoms, no worse.  Has been resting, increasing fluids and taking cough medication as needed.  Advised isolation for 5 more days and again ED precautions discussed.  Final Clinical Impressions(s) / UC Diagnoses   Final diagnoses:  Viral illness  Chills  Diarrhea, unspecified type     Discharge Instructions     You have received COVID testing today either for positive exposure, concerning symptoms that could be related to COVID infection, screening purposes, or re-testing after confirmed positive.  Your test obtained today checks for active viral infection in  the last 1-2 weeks. If your test is negative now, you can still test positive later. So, if you do develop symptoms you should either get re-tested and/or isolate x 10 days. Please follow CDC guidelines.  While Rapid antigen tests come back in 15-20 minutes, send out  PCR/molecular test results typically come back within 24 hours. In the mean time, if you are symptomatic, assume this could be a positive test and treat/monitor yourself as if you do have COVID.   We will call with test results. Please download the MyChart app and set up a profile to access test results.   If symptomatic, go home and rest. Push fluids. Take Tylenol as needed for discomfort. Gargle warm salt water. Throat lozenges. Take Mucinex DM or Robitussin for cough. Humidifier in bedroom to ease coughing. Warm showers. Also review the COVID handout for more information.  COVID-19 INFECTION: The incubation period of COVID-19 is approximately 14 days after exposure, with most symptoms developing in roughly 4-5 days. Symptoms may range in severity from mild to critically severe. Roughly 80% of those infected will have mild symptoms. People of any age may become infected with COVID-19 and have the ability to transmit the virus. The most common symptoms include: fever, fatigue, cough, body aches, headaches, sore throat, nasal congestion, shortness of breath, nausea, vomiting, diarrhea, changes in smell and/or taste.    COURSE OF ILLNESS Some patients may begin with mild disease which can progress quickly into critical symptoms. If your symptoms are worsening please call ahead to the Emergency Department and proceed there for further treatment. Recovery time appears to be roughly 1-2 weeks for mild symptoms and 3-6 weeks for severe disease.   GO IMMEDIATELY TO ER FOR FEVER YOU ARE UNABLE TO GET DOWN WITH TYLENOL, BREATHING PROBLEMS, CHEST PAIN, FATIGUE, LETHARGY, INABILITY TO EAT OR DRINK, ETC  QUARANTINE AND ISOLATION: To help decrease the spread of COVID-19 please remain isolated if you have COVID infection or are highly suspected to have COVID infection. This means -stay home and isolate to one room in the home if you live with others. Do not share a bed or bathroom with others while ill, sanitize and  wipe down all countertops and keep common areas clean and disinfected. You may discontinue isolation if you have a mild case and are asymptomatic 10 days after symptom onset as long as you have been fever free >24 hours without having to take Motrin or Tylenol. If your case is more severe (meaning you develop pneumonia or are admitted in the hospital), you may have to isolate longer.   If you have been in close contact (within 6 feet) of someone diagnosed with COVID 19, you are advised to quarantine in your home for 14 days as symptoms can develop anywhere from 2-14 days after exposure to the virus. If you develop symptoms, you  must isolate.  Most current guidelines for COVID after exposure -isolate 10 days if you ARE NOT tested for COVID as long as symptoms do not develop -isolate 7 days if you are tested and remain asymptomatic -You do not necessarily need to be tested for COVID if you have + exposure and        develop   symptoms. Just isolate at home x10 days from symptom onset During this global pandemic, CDC advises to practice social distancing, try to stay at least 92ft away from others at all times. Wear a face covering. Wash and sanitize your hands regularly and avoid going anywhere that  is not necessary.  KEEP IN MIND THAT THE COVID TEST IS NOT 100% ACCURATE AND YOU SHOULD STILL DO EVERYTHING TO PREVENT POTENTIAL SPREAD OF VIRUS TO OTHERS (WEAR MASK, WEAR GLOVES, [place] HANDS AND SANITIZE REGULARLY). IF INITIAL TEST IS NEGATIVE, THIS MAY NOT MEAN YOU ARE DEFINITELY NEGATIVE. MOST ACCURATE TESTING IS DONE 5-7 DAYS AFTER EXPOSURE.   It is not advised by CDC to get re-tested after receiving a positive COVID test since you can still test positive for weeks to months after you have already cleared the virus.   *If you have not been vaccinated for COVID, I strongly suggest you consider getting vaccinated as long as there are no contraindications.      ED Prescriptions    None     PDMP not  reviewed this encounter.   Danton Clap, PA-C 06/21/20 1233    Danton Clap, PA-C 06/22/20 [id number]

## 2020-06-21 NOTE — ED Triage Notes (Signed)
Patient c/o diarrhea, chills, and bodyaches that started 3-4 days. Patient denies fevers.  Patient states that he recently traveled to [place].

## 2020-06-21 NOTE — Discharge Instructions (Addendum)

## 2020-06-22 ENCOUNTER — Telehealth: Payer: Self-pay | Admitting: Unknown Physician Specialty

## 2020-06-22 ENCOUNTER — Other Ambulatory Visit: Payer: Self-pay | Admitting: Unknown Physician Specialty

## 2020-06-22 ENCOUNTER — Telehealth (HOSPITAL_COMMUNITY): Payer: Self-pay | Admitting: Emergency Medicine

## 2020-06-22 DIAGNOSIS — J4531 Mild persistent asthma with (acute) exacerbation: Secondary | ICD-10-CM

## 2020-06-22 DIAGNOSIS — I10 Essential (primary) hypertension: Secondary | ICD-10-CM

## 2020-06-22 DIAGNOSIS — U071 COVID-19: Secondary | ICD-10-CM

## 2020-06-22 DIAGNOSIS — E663 Overweight: Secondary | ICD-10-CM

## 2020-06-22 NOTE — Telephone Encounter (Signed)
I connected by phone with Derek Cook on 06/22/2020 at 3:12 PM to discuss the potential use of a new treatment for mild to moderate COVID-19 viral infection in non-hospitalized patients.  This patient is a 59 y.o. male that meets the FDA criteria for Emergency Use Authorization of COVID monoclonal antibody casirivimab/imdevimab or bamlanivimab/eteseviamb.  Has a (+) direct SARS-CoV-2 viral test result  Has mild or moderate COVID-19   Is NOT hospitalized due to COVID-19  Is within 10 days of symptom onset  Has at least one of the high risk factor(s) for progression to severe COVID-19 and/or hospitalization as defined in EUA.  Specific high risk criteria : BMI > 25 and Chronic Lung Disease   I have spoken and communicated the following to the patient or parent/caregiver regarding COVID monoclonal antibody treatment:  1. FDA has authorized the emergency use for the treatment of mild to moderate COVID-19 in adults and pediatric patients with positive results of direct SARS-CoV-2 viral testing who are 31 years of age and older weighing at least 40 kg, and who are at high risk for progressing to severe COVID-19 and/or hospitalization.  2. The significant known and potential risks and benefits of COVID monoclonal antibody, and the extent to which such potential risks and benefits are unknown.  3. Information on available alternative treatments and the risks and benefits of those alternatives, including clinical trials.  4. Patients treated with COVID monoclonal antibody should continue to self-isolate and use infection control measures (e.g., wear mask, isolate, social distance, avoid sharing personal items, clean and disinfect "high touch" surfaces, and frequent handwashing) according to CDC guidelines.   5. The patient or parent/caregiver has the option to accept or refuse COVID monoclonal antibody treatment.  After reviewing this information with the patient, the patient has agreed to  receive one of the available covid 19 monoclonal antibodies and will be provided an appropriate fact sheet prior to infusion. [name], NP 06/22/2020 3:12 PM  Sx onset 10/11

## 2020-06-22 NOTE — Telephone Encounter (Signed)
Called pt and explained possible monoclonal antibody treatment. Sx started 10/11. Tested positive 10/15 at [facility]. Sx include body aches, cough, weakness, fatigue, and diarrhea. Qualifying risk factors mild persistent asthma, HTN, and BMI 38. Pt interested in tx. Informed pt an APP will call back to possibly schedule an appointment.

## 2020-06-23 ENCOUNTER — Telehealth (HOSPITAL_COMMUNITY): Payer: Self-pay | Admitting: Oncology

## 2020-06-23 ENCOUNTER — Ambulatory Visit (HOSPITAL_COMMUNITY)
Admission: RE | Admit: 2020-06-23 | Discharge: 2020-06-23 | Disposition: A | Discharge status: Home / Self Care | Payer: 59 | Source: Ambulatory Visit | Attending: Pulmonary Disease | Admitting: Pulmonary Disease

## 2020-06-23 DIAGNOSIS — J4531 Mild persistent asthma with (acute) exacerbation: Secondary | ICD-10-CM | POA: Diagnosis present

## 2020-06-23 DIAGNOSIS — U071 COVID-19: Secondary | ICD-10-CM

## 2020-06-23 DIAGNOSIS — I10 Essential (primary) hypertension: Secondary | ICD-10-CM

## 2020-06-23 DIAGNOSIS — E663 Overweight: Secondary | ICD-10-CM | POA: Diagnosis present

## 2020-06-23 MED ORDER — EPINEPHRINE 0.3 MG/0.3ML IJ SOAJ: 0.3000 mg | Freq: Once | INTRAMUSCULAR | Status: DC | PRN

## 2020-06-23 MED ORDER — ALBUTEROL SULFATE HFA 108 (90 BASE) MCG/ACT IN AERS: 2.0000 | INHALATION_SPRAY | Freq: Once | RESPIRATORY_TRACT | Status: DC | PRN

## 2020-06-23 MED ORDER — METHYLPREDNISOLONE SODIUM SUCC 125 MG IJ SOLR: 125.0000 mg | Freq: Once | INTRAMUSCULAR | Status: DC | PRN

## 2020-06-23 MED ORDER — SODIUM CHLORIDE 0.9 % IV SOLN: Freq: Once | INTRAVENOUS | Status: AC

## 2020-06-23 MED ORDER — DIPHENHYDRAMINE HCL 50 MG/ML IJ SOLN: 50.0000 mg | Freq: Once | INTRAMUSCULAR | Status: DC | PRN

## 2020-06-23 MED ORDER — ONDANSETRON HCL 4 MG/2ML IJ SOLN: 4.0000 mg | Freq: Once | INTRAMUSCULAR | Status: DC

## 2020-06-23 MED ORDER — FAMOTIDINE IN NACL 20-0.9 MG/50ML-% IV SOLN: 20.0000 mg | Freq: Once | INTRAVENOUS | Status: DC | PRN

## 2020-06-23 MED ORDER — SODIUM CHLORIDE 0.9 % IV SOLN: INTRAVENOUS | Status: DC | PRN

## 2020-06-23 NOTE — Telephone Encounter (Signed)
Re: Mab infusion  Patient already scheduled to receive Mab today.   Faythe Casa, NP 06/23/2020 8:34 AM

## 2020-06-23 NOTE — Discharge Instructions (Signed)

## 2020-06-23 NOTE — Progress Notes (Signed)
Patient ID: Derek Cook, male   DOB: [birth year], 59 y.o.   MRN: [id number]  Diagnosis: COVID-19  Physician: Dr. Joya Gaskins  Procedure: [facility] Infusion Clinic Med: bamlanivimab\etesevimab infusion - Provided patient with bamlanimivab\etesevimab fact sheet for patients, parents and caregivers prior to infusion.  Complications: No immediate complications noted.  Discharge: Discharged home   [name] 06/23/2020

## 2020-06-25 ENCOUNTER — Inpatient Hospital Stay
Admission: EM | Admit: 2020-06-25 | Discharge: 2020-07-14 | DRG: 177 | Disposition: A | Discharge status: Home / Self Care | Payer: 59 | Attending: Internal Medicine | Admitting: Internal Medicine

## 2020-06-25 ENCOUNTER — Emergency Department: Payer: 59

## 2020-06-25 ENCOUNTER — Encounter: Payer: Self-pay | Admitting: Emergency Medicine

## 2020-06-25 ENCOUNTER — Ambulatory Visit: Payer: Self-pay | Admitting: Family Medicine

## 2020-06-25 ENCOUNTER — Other Ambulatory Visit: Payer: Self-pay

## 2020-06-25 DIAGNOSIS — E860 Dehydration: Secondary | ICD-10-CM | POA: Diagnosis present

## 2020-06-25 DIAGNOSIS — U071 COVID-19: Secondary | ICD-10-CM | POA: Diagnosis present

## 2020-06-25 DIAGNOSIS — J159 Unspecified bacterial pneumonia: Secondary | ICD-10-CM | POA: Diagnosis present

## 2020-06-25 DIAGNOSIS — E785 Hyperlipidemia, unspecified: Secondary | ICD-10-CM | POA: Diagnosis present

## 2020-06-25 DIAGNOSIS — E222 Syndrome of inappropriate secretion of antidiuretic hormone: Secondary | ICD-10-CM | POA: Diagnosis present

## 2020-06-25 DIAGNOSIS — Z87891 Personal history of nicotine dependence: Secondary | ICD-10-CM | POA: Diagnosis not present

## 2020-06-25 DIAGNOSIS — Z23 Encounter for immunization: Secondary | ICD-10-CM | POA: Diagnosis not present

## 2020-06-25 DIAGNOSIS — Z7982 Long term (current) use of aspirin: Secondary | ICD-10-CM | POA: Diagnosis not present

## 2020-06-25 DIAGNOSIS — R000 Tachycardia, unspecified: Secondary | ICD-10-CM

## 2020-06-25 DIAGNOSIS — J4530 Mild persistent asthma, uncomplicated: Secondary | ICD-10-CM | POA: Diagnosis present

## 2020-06-25 DIAGNOSIS — R0602 Shortness of breath: Secondary | ICD-10-CM | POA: Diagnosis present

## 2020-06-25 DIAGNOSIS — R0902 Hypoxemia: Secondary | ICD-10-CM

## 2020-06-25 DIAGNOSIS — R7401 Elevation of levels of liver transaminase levels: Secondary | ICD-10-CM

## 2020-06-25 DIAGNOSIS — E871 Hypo-osmolality and hyponatremia: Secondary | ICD-10-CM | POA: Diagnosis not present

## 2020-06-25 DIAGNOSIS — K219 Gastro-esophageal reflux disease without esophagitis: Secondary | ICD-10-CM | POA: Diagnosis present

## 2020-06-25 DIAGNOSIS — Z6838 Body mass index (BMI) 38.0-38.9, adult: Secondary | ICD-10-CM

## 2020-06-25 DIAGNOSIS — Z7951 Long term (current) use of inhaled steroids: Secondary | ICD-10-CM | POA: Diagnosis not present

## 2020-06-25 DIAGNOSIS — F32A Depression, unspecified: Secondary | ICD-10-CM | POA: Diagnosis present

## 2020-06-25 DIAGNOSIS — E669 Obesity, unspecified: Secondary | ICD-10-CM | POA: Diagnosis present

## 2020-06-25 DIAGNOSIS — J9601 Acute respiratory failure with hypoxia: Secondary | ICD-10-CM | POA: Diagnosis present

## 2020-06-25 DIAGNOSIS — J1282 Pneumonia due to coronavirus disease 2019: Secondary | ICD-10-CM | POA: Diagnosis present

## 2020-06-25 LAB — CBC WITH DIFFERENTIAL/PLATELET
Abs Immature Granulocytes: 0.02 10*3/uL (ref 0.00–0.07)
Basophils Absolute: 0 10*3/uL (ref 0.0–0.1)
Basophils Relative: 0 %
Eosinophils Absolute: 0 10*3/uL (ref 0.0–0.5)
Eosinophils Relative: 0 %
HCT: 43.2 % (ref 39.0–52.0)
Hemoglobin: 14.7 g/dL (ref 13.0–17.0)
Immature Granulocytes: 0 %
Lymphocytes Relative: 9 %
Lymphs Abs: 0.7 10*3/uL (ref 0.7–4.0)
MCH: 27.8 pg (ref 26.0–34.0)
MCHC: 34 g/dL (ref 30.0–36.0)
MCV: 81.7 fL (ref 80.0–100.0)
Monocytes Absolute: 0.4 10*3/uL (ref 0.1–1.0)
Monocytes Relative: 6 %
Neutro Abs: 6.4 10*3/uL (ref 1.7–7.7)
Neutrophils Relative %: 85 %
Platelets: 242 10*3/uL (ref 150–400)
RBC: 5.29 MIL/uL (ref 4.22–5.81)
RDW: 14.6 % (ref 11.5–15.5)
WBC: 7.5 10*3/uL (ref 4.0–10.5)
nRBC: 0 % (ref 0.0–0.2)

## 2020-06-25 LAB — COMPREHENSIVE METABOLIC PANEL
ALT: 75 U/L — ABNORMAL HIGH (ref 0–44)
AST: 129 U/L — ABNORMAL HIGH (ref 15–41)
Albumin: 3.4 g/dL — ABNORMAL LOW (ref 3.5–5.0)
Alkaline Phosphatase: 57 U/L (ref 38–126)
Anion gap: 12 (ref 5–15)
BUN: 20 mg/dL (ref 6–20)
CO2: 22 mmol/L (ref 22–32)
Calcium: 8.6 mg/dL — ABNORMAL LOW (ref 8.9–10.3)
Chloride: 100 mmol/L (ref 98–111)
Creatinine, Ser: 0.93 mg/dL (ref 0.61–1.24)
GFR, Estimated: >60 mL/min (ref >60)
Glucose, Bld: 120 mg/dL — ABNORMAL HIGH (ref 70–99)
Potassium: 4.2 mmol/L (ref 3.5–5.1)
Sodium: 134 mmol/L — ABNORMAL LOW (ref 135–145)
Total Bilirubin: 1.1 mg/dL (ref 0.3–1.2)
Total Protein: 7.5 g/dL (ref 6.5–8.1)

## 2020-06-25 LAB — MAGNESIUM: Magnesium: 2.4 mg/dL (ref 1.7–2.4)

## 2020-06-25 LAB — LACTIC ACID, PLASMA: Lactic Acid, Venous: 1.8 mmol/L (ref 0.5–1.9)

## 2020-06-25 LAB — LIPASE, BLOOD: Lipase: 29 U/L (ref 11–51)

## 2020-06-25 LAB — TROPONIN I (HIGH SENSITIVITY): Troponin I (High Sensitivity): 11 ng/L (ref <18)

## 2020-06-25 MED ORDER — ONDANSETRON HCL 4 MG/2ML IJ SOLN
4.0000 mg | Freq: Once | INTRAMUSCULAR | Status: AC
  Administered 2020-06-25: 4 mg via INTRAVENOUS

## 2020-06-25 MED ORDER — SODIUM CHLORIDE 0.9 % IV SOLN
200.0000 mg | Freq: Once | INTRAVENOUS | Status: AC
  Administered 2020-06-25: 200 mg via INTRAVENOUS
  Filled 2020-06-25: qty 200

## 2020-06-25 MED ORDER — ENOXAPARIN SODIUM 40 MG/0.4ML ~~LOC~~ SOLN: 40.0000 mg | SUBCUTANEOUS | Status: DC

## 2020-06-25 MED ORDER — ENOXAPARIN SODIUM 60 MG/0.6ML ~~LOC~~ SOLN
0.5000 mg/kg | SUBCUTANEOUS | Status: DC
  Administered 2020-06-25 – 2020-06-28 (×3): 57.5 mg via SUBCUTANEOUS
  Filled 2020-06-25 (×5): qty 0.6

## 2020-06-25 MED ORDER — IPRATROPIUM-ALBUTEROL 20-100 MCG/ACT IN AERS
1.0000 | INHALATION_SPRAY | Freq: Four times a day (QID) | RESPIRATORY_TRACT | Status: DC | PRN
  Filled 2020-06-25: qty 4

## 2020-06-25 MED ORDER — ACETAMINOPHEN 650 MG RE SUPP: 650.0000 mg | Freq: Four times a day (QID) | RECTAL | Status: DC | PRN

## 2020-06-25 MED ORDER — MOMETASONE FURO-FORMOTEROL FUM 200-5 MCG/ACT IN AERO
2.0000 | INHALATION_SPRAY | Freq: Two times a day (BID) | RESPIRATORY_TRACT | Status: DC
  Administered 2020-06-25 – 2020-07-14 (×38): 2 via RESPIRATORY_TRACT
  Filled 2020-06-25: qty 8.8

## 2020-06-25 MED ORDER — DEXAMETHASONE 4 MG PO TABS
6.0000 mg | ORAL_TABLET | Freq: Every day | ORAL | Status: DC
  Administered 2020-06-26 – 2020-06-27 (×2): 6 mg via ORAL
  Filled 2020-06-25 (×2): qty 1.5

## 2020-06-25 MED ORDER — PANTOPRAZOLE SODIUM 40 MG PO TBEC
40.0000 mg | DELAYED_RELEASE_TABLET | Freq: Every day | ORAL | Status: DC
  Administered 2020-06-26 – 2020-07-14 (×19): 40 mg via ORAL
  Filled 2020-06-25 (×19): qty 1

## 2020-06-25 MED ORDER — ASPIRIN EC 81 MG PO TBEC
81.0000 mg | DELAYED_RELEASE_TABLET | Freq: Every day | ORAL | Status: DC
  Administered 2020-06-26 – 2020-07-14 (×19): 81 mg via ORAL
  Filled 2020-06-25 (×19): qty 1

## 2020-06-25 MED ORDER — ONDANSETRON HCL 4 MG/2ML IJ SOLN: 4.0000 mg | Freq: Four times a day (QID) | INTRAMUSCULAR | Status: DC | PRN

## 2020-06-25 MED ORDER — SODIUM CHLORIDE 0.9 % IV SOLN
100.0000 mg | Freq: Every day | INTRAVENOUS | Status: AC
  Administered 2020-06-26 – 2020-06-29 (×4): 100 mg via INTRAVENOUS
  Filled 2020-06-25: qty 20
  Filled 2020-06-25: qty 100
  Filled 2020-06-25: qty 20
  Filled 2020-06-25: qty 100

## 2020-06-25 MED ORDER — BENZONATATE 100 MG PO CAPS
100.0000 mg | ORAL_CAPSULE | Freq: Three times a day (TID) | ORAL | Status: DC | PRN
  Administered 2020-06-28 – 2020-07-04 (×4): 100 mg via ORAL
  Filled 2020-06-25 (×5): qty 1

## 2020-06-25 MED ORDER — DEXAMETHASONE SODIUM PHOSPHATE 10 MG/ML IJ SOLN
6.0000 mg | Freq: Once | INTRAMUSCULAR | Status: AC
  Administered 2020-06-25: 6 mg via INTRAVENOUS
  Filled 2020-06-25: qty 1

## 2020-06-25 MED ORDER — ACETAMINOPHEN 325 MG PO TABS
650.0000 mg | ORAL_TABLET | Freq: Four times a day (QID) | ORAL | Status: DC | PRN
  Administered 2020-06-26 – 2020-06-27 (×4): 650 mg via ORAL
  Filled 2020-06-25 (×4): qty 2

## 2020-06-25 MED ORDER — LACTATED RINGERS IV BOLUS
1000.0000 mL | Freq: Once | INTRAVENOUS | Status: AC
  Administered 2020-06-25: 1000 mL via INTRAVENOUS

## 2020-06-25 NOTE — ED Provider Notes (Signed)
[facility] Emergency Department Provider Note ____________________________________________   First MD Initiated Contact with Patient 06/25/20 1533     (approximate)  I have reviewed the triage vital signs and the nursing notes.  HISTORY  Chief Complaint Shortness of Breath, Diarrhea, and Weakness   HPI Derek Cook is a 59 y.o. malewho presents to the ED for evaluation of shortness of breath and generalized weakness.  Chart review indicates history of asthma, GERD and HLD. Diagnosed with COVID-19 4 days ago via urgent care visit in [place], where he was noted to have 4 days of symptoms at that time.  Patient is unvaccinated.  Patient received outpatient antibody infusion 2 days ago.    Patient reports about 1 week of progressively worsening nonproductive cough, generalized weakness, watery diarrhea.  Denies any productive cough, syncope, abdominal pain, falls.  Patient reports intermittent tightness sensation in his chest, typically when he is at rest, and denies exertional worsening of this.  Denies cardiac history.    Past Medical History:  Diagnosis Date  . Allergy   . Asthma   . Colon polyp   . GERD (gastroesophageal reflux disease)   . Hyperlipidemia     Patient Active Problem List   Diagnosis Date Noted  . Myalgia due to statin 12/03/2017  . Chronic low back pain 06/21/2017  . Multiple nevi 06/21/2017  . Familial hypertriglyceridemia 04/06/2017  . Plantar fasciitis of right foot 04/05/2017  . Allergic rhinitis due to allergen 09/17/2016  . Dry skin dermatitis 09/09/2016  . Carpal tunnel syndrome, left 06/25/2016  . GERD (gastroesophageal reflux disease) 05/29/2016  . Prediabetes 03/24/2016  . Mild persistent asthma 02/13/2016  . Hyperlipidemia 02/13/2016  . Hypertension 02/13/2016    Past Surgical History:  Procedure Laterality Date  . CLAVICLE EXCISION    . HERNIA REPAIR  2004, 2005  . WRIST ARTHROPLASTY      Prior to Admission  medications   Medication Sig Start Date End Date Taking? Authorizing Provider  albuterol (VENTOLIN HFA) 108 (90 Base) MCG/ACT inhaler INHALE 2 PUFFS BY MOUTH EVERY 4 TO 6 HOURS AS NEEDED WHEEZING/ SHORTNESS OF BREATH 04/03/20   Karamalegos, Devonne Doughty, DO  amoxicillin-clavulanate (AUGMENTIN) 875-125 MG tablet Take 1 tablet by mouth 2 (two) times daily. For 10 days 04/19/20   Olin Hauser, DO  aspirin EC 81 MG tablet Take 81 mg by mouth.    [provider]  benzonatate (TESSALON) 100 MG capsule Take 1 capsule (100 mg total) by mouth 3 (three) times daily as needed for cough. 04/19/20   Karamalegos, Devonne Doughty, DO  Fluticasone-Salmeterol (ADVAIR) 500-50 MCG/DOSE AEPB Inhale 1 puff into the lungs 2 (two) times daily. 04/19/20   Karamalegos, Devonne Doughty, DO  omeprazole (PRILOSEC) 40 MG capsule TAKE 1 CAPSULE BY MOUTH ONCE DAILY 02/06/20   Parks Ranger, Devonne Doughty, DO  predniSONE (DELTASONE) 10 MG tablet Take 6 tabs with breakfast Day 1, 5 tabs Day 2, 4 tabs Day 3, 3 tabs Day 4, 2 tabs Day 5, 1 tab Day 6. 04/19/20   Karamalegos, Alexander J, DO  sildenafil (REVATIO) 20 MG tablet TAKE 1 TO 5 TABLETS BY MOUTH 30 MINUTES PRIOR TO SEX.(START WITH 1 TAB AND INCREASE AS NEEDED 12/07/17   Parks Ranger, Devonne Doughty, DO  ipratropium (ATROVENT) 0.06 % nasal spray Place 2 sprays into both nostrils 4 (four) times daily. For up to 5-7 days then stop. 10/20/17 03/25/19  Karamalegos, Devonne Doughty, DO    Allergies Statins, Azithromycin, Shellfish-derived products, and  Sulfa antibiotics  Family History  Problem Relation Age of Onset  . Cancer Mother        melanoma  . Diabetes Mother   . Hyperlipidemia Mother   . Dementia Mother   . Cancer Father   . Cancer Sister   . Heart disease Sister   . Diabetes Brother   . Hyperlipidemia Brother   . Dementia Paternal Grandmother     Social History Social History   Tobacco Use  . Smoking status: Former Smoker    Quit date: 09/07/2009    Years since  quitting: 10.8  . Smokeless tobacco: Former Network engineer  . Vaping Use: Never used  Substance Use Topics  . Alcohol use: No  . Drug use: No    Review of Systems  Constitutional: No fever/chills Eyes: No visual changes. ENT: No sore throat. Cardiovascular: Positive for chest pain. Respiratory: Positive nonproductive cough and shortness of breath. Gastrointestinal: No abdominal pain.  No nausea, no vomiting. .  No constipation.  Positive for watery diarrhea. Genitourinary: Negative for dysuria. Musculoskeletal: Negative for back pain. Skin: Negative for rash. Neurological: Negative for headaches, focal weakness or numbness.  ____________________________________________   PHYSICAL EXAM:  VITAL SIGNS: Vitals:   06/25/20 1455 06/25/20 1556  BP:  (!) 143/77  Pulse:  (!) 110  Resp:  (!) 30  Temp:    SpO2: 93% 91%      Constitutional: Alert and oriented.  Uncomfortable-appearing.  Conversational full sentences on nasal cannula. Eyes: Conjunctivae are normal. PERRL. EOMI. Head: Atraumatic. Nose: No congestion/rhinnorhea. Mouth/Throat: Mucous membranes are dry.  Oropharynx non-erythematous. Neck: No stridor. No cervical spine tenderness to palpation. Cardiovascular: Tachycardic rate, regular rhythm. Grossly normal heart sounds.  Good peripheral circulation. Respiratory: Tachypnea to the mid 20s, otherwise no evidence of distress.  Clear lungs..  No retractions. Lungs CTAB. Gastrointestinal: Soft , nondistended, nontender to palpation. No abdominal bruits. No CVA tenderness. Musculoskeletal: No lower extremity tenderness nor edema.  No joint effusions. No signs of acute trauma. Neurologic:  Normal speech and language. No gross focal neurologic deficits are appreciated. No gait instability noted. Skin:  Skin is warm, dry and intact. No rash noted. Psychiatric: Mood and affect are normal. Speech and behavior are normal.  ____________________________________________    LABS (all labs ordered are listed, but only abnormal results are displayed)  Labs Reviewed  COMPREHENSIVE METABOLIC PANEL - Abnormal; Notable for the following components:      Result Value   Sodium 134 (*)    Glucose, Bld 120 (*)    Calcium 8.6 (*)    Albumin 3.4 (*)    AST 129 (*)    ALT 75 (*)    All other components within normal limits  CBC WITH DIFFERENTIAL/PLATELET  LIPASE, BLOOD  LACTIC ACID, PLASMA  LACTIC ACID, PLASMA  URINALYSIS, COMPLETE (UACMP) WITH MICROSCOPIC  TROPONIN I (HIGH SENSITIVITY)   ____________________________________________  12 Lead EKG  Sinus rhythm, rate of 117 bpm.  Normal axis.  Normal intervals.  No evidence of acute ischemia.  Wandering baseline. ____________________________________________  RADIOLOGY  ED MD interpretation: 2 view CXR reviewed by me with evidence of patchy multifocal opacities consistent with COVID-19 without discrete lobar filtration to suggest a bacterial pneumonia.  Official radiology report(s): DG Chest 2 View  Result Date: 06/25/2020 CLINICAL DATA:  Shortness of breath EXAM: CHEST - 2 VIEW COMPARISON:  None. FINDINGS: Cardiac shadow is within normal limits. Lungs are well aerated bilaterally with evidence bilateral airspace opacities consistent with the  known history of COVID-19 positivity. No sizable effusion is seen. No bony abnormality is noted. IMPRESSION: Patchy airspace opacities consistent with COVID-19 pneumonia. Electronically Signed   By: Inez Catalina M.D.   On: 06/25/2020 15:31    ____________________________________________   PROCEDURES and INTERVENTIONS  Procedure(s) performed (including Critical Care):  .1-3 Lead EKG Interpretation Performed by: Vladimir Crofts, MD Authorized by: Vladimir Crofts, MD     Interpretation: abnormal     ECG rate:  112   ECG rate assessment: tachycardic     Rhythm: sinus tachycardia     Ectopy: none     Conduction: normal      Medications  remdesivir 200 mg in sodium  chloride 0.9% 250 mL IVPB (has no administration in time range)    Followed by  remdesivir 100 mg in sodium chloride 0.9 % 100 mL IVPB (has no administration in time range)  dexamethasone (DECADRON) injection 6 mg (6 mg Intravenous Given 06/25/20 1610)  lactated ringers bolus 1,000 mL (1,000 mLs Intravenous New Bag/Given 06/25/20 1610)    ____________________________________________   MDM / ED COURSE  Unvaccinated 59 year old man presents to the ED with 1 week of progressively worsening COVID-19 symptoms with evidence of hypoxia and requiring medical admission.  Patient hypoxic to the mid 80s on room air requiring 4 L nasal cannula.  Noted to be in sinus tachycardia, likely due to dehydration in the setting of poor p.o. intake and watery diarrhea.  Blood work without significant acute derangements.  CXR with expected opacities in setting of COVID-19 without evidence of superimposed bacterial pneumonia.  EKG demonstrates sinus tach without ischemic features to suggest ACS.  Due to hypoxia, patient started on a course of Decadron.  We will provide remdesivir and admit to hospitalist medicine for further work-up and management.      ____________________________________________   FINAL CLINICAL IMPRESSION(S) / ED DIAGNOSES  Final diagnoses:  COVID-19  Hypoxia  Shortness of breath  Sinus tachycardia  Dehydration     ED Discharge Orders    None       Inita Uram   Note:  This document was prepared using Dragon voice recognition software and may include unintentional dictation errors.   Vladimir Crofts, MD 06/25/20 1623

## 2020-06-25 NOTE — H&P (Signed)
History and Physical    PLEASE NOTE THAT DRAGON DICTATION SOFTWARE WAS USED IN THE CONSTRUCTION OF THIS NOTE.   Derek Cook DOA: 06/25/2020  PCP: Olin Hauser, DO Patient coming from: home   I have personally briefly reviewed patient's old medical records in [place]  Chief Complaint: Shortness of breath  HPI: Derek Cook is a 59 y.o. male with medical history significant for mild persistent asthma, GERD, who is admitted to [facility] on 06/25/2020 with acute hypoxic respiratory failure in the setting of severe COVID-19 pneumonia after presenting from home to [facility] Emergency Department complaining of shortness of breath.   The patient reports approximately 1 week of progressive shortness of breath associated with nonproductive cough.  Denies any associated orthopnea, PND, or peripheral edema.  Denies any associated chest pain, palpitations, diaphoresis, dizziness, presyncope, or syncope.  Over the last week he also notes increased fatigue relative baseline.  Denies any associated subjective fever, chills, rigors, or generalized myalgias.  Denies any recent headache, neck stiffness, rhinitis, rhinorrhea, sore throat, nausea, vomiting, abdominal pain, diarrhea, or rash.  While he is unaware of any specific known COVID-19 exposures, the patient reports that he recently traveled to the state of [place] and acknowledges that he has not yet received his COVID-19 vaccine.  Denies any associated hemoptysis.  He reports that he is a former smoker, having completely quit smoking in 2009 after smoking 1 to 1.5 packs/day over the preceding 10 years.  While he denies any underlying diagnosis of COPD, he reports that he continues to carry a diagnosis of asthma for which he routinely is on scheduled Advair as well as as needed albuterol inhaler.  He reports that he experienced an episode of acute bronchitis in August 21,  which was treated with a brief prednisone taper as well as Augmentin following which his breathing returned to baseline until development of the above symptoms approximately 1 week ago. he denies any known personal history of diabetes, hypertension, or chronic heart failure.  He confirms no baseline supplemental oxygen requirement.  In the setting of progressive shortness of breath and nonproductive cough, the patient presented to local urgent care on Friday, 06/21/2020, at which time he underwent nasopharyngeal COVID-19 swab, with result ultimately found to be positive.  Subsequently underwent antibody therapy at the infusion center, without subsequent improvement in his symptoms prompting him to present to [facility] emergency department this evening for further evaluation.    ED Course:  Vital signs in the ED were notable for the following: Temperature max 98.6; heart rate 107 117; blood pressure 120/67-140 3/66; respiratory rate 24-30; oxygen saturation 87% on room air, which improved to 93% on 4 L nasal cannula.  Labs were notable for the following: CMP notable for the following: Sodium 134,  At 22, creatinine 0.93, AST 129, ALT 75.  High-sensitivity troponin x111.  CBC notable for white blood cell count of 7500 and hemoglobin 14.7.  Lactic acid 1.8.  Repeat nasopharyngeal COVID-19 PCR was performed in the ED today, and found to be positive.  Chest x-ray showed evidence of patchy airspace opacities bilaterally consistent with COVID-19 pneumonia in the absence of evidence of edema or effusion.  EKG showed sinus tachycardia with heart rate 117 and no evidence of T wave or ST changes.  While in the ED, the following were administered: Decadron 6 mg IV x1, and remdesivir 200 mg IV x1; lactated Ringer's x1 L bolus.  Review of Systems: As per HPI otherwise 10 point review of systems negative.   Past Medical History:  Diagnosis Date  . Allergy   . Asthma   . Colon  polyp   . GERD (gastroesophageal reflux disease)   . Hyperlipidemia     Past Surgical History:  Procedure Laterality Date  . CLAVICLE EXCISION    . HERNIA REPAIR  2004, 2005  . WRIST ARTHROPLASTY      Social History:  reports that he quit smoking about 10 years ago. He has quit using smokeless tobacco. He reports that he does not drink alcohol and does not use drugs.   Allergies  Allergen Reactions  . Statins Other (See Comments)    myalgias  . Azithromycin Hives  . Shellfish-Derived Products   . Sulfa Antibiotics     Family History  Problem Relation Age of Onset  . Cancer Mother        melanoma  . Diabetes Mother   . Hyperlipidemia Mother   . Dementia Mother   . Cancer Father   . Cancer Sister   . Heart disease Sister   . Diabetes Brother   . Hyperlipidemia Brother   . Dementia Paternal Grandmother     Prior to Admission medications   Medication Sig Start Date End Date Taking? Authorizing Provider  albuterol (VENTOLIN HFA) 108 (90 Base) MCG/ACT inhaler INHALE 2 PUFFS BY MOUTH EVERY 4 TO 6 HOURS AS NEEDED WHEEZING/ SHORTNESS OF BREATH 04/03/20   Karamalegos, Devonne Doughty, DO  amoxicillin-clavulanate (AUGMENTIN) 875-125 MG tablet Take 1 tablet by mouth 2 (two) times daily. For 10 days 04/19/20   Olin Hauser, DO  aspirin EC 81 MG tablet Take 81 mg by mouth.    [provider]  benzonatate (TESSALON) 100 MG capsule Take 1 capsule (100 mg total) by mouth 3 (three) times daily as needed for cough. 04/19/20   Karamalegos, Devonne Doughty, DO  Fluticasone-Salmeterol (ADVAIR) 500-50 MCG/DOSE AEPB Inhale 1 puff into the lungs 2 (two) times daily. 04/19/20   Karamalegos, Devonne Doughty, DO  omeprazole (PRILOSEC) 40 MG capsule TAKE 1 CAPSULE BY MOUTH ONCE DAILY 02/06/20   Parks Ranger, Devonne Doughty, DO  predniSONE (DELTASONE) 10 MG tablet Take 6 tabs with breakfast Day 1, 5 tabs Day 2, 4 tabs Day 3, 3 tabs Day 4, 2 tabs Day 5, 1 tab Day 6. 04/19/20   Karamalegos, Alexander  J, DO  sildenafil (REVATIO) 20 MG tablet TAKE 1 TO 5 TABLETS BY MOUTH 30 MINUTES PRIOR TO SEX.(START WITH 1 TAB AND INCREASE AS NEEDED 12/07/17   Parks Ranger, Devonne Doughty, DO  ipratropium (ATROVENT) 0.06 % nasal spray Place 2 sprays into both nostrils 4 (four) times daily. For up to 5-7 days then stop. 10/20/17 03/25/19  Olin Hauser, DO     Objective    Physical Exam: Vitals:   06/25/20 1448 06/25/20 1449 06/25/20 1455 06/25/20 1556  BP: 120/67   (!) 143/77  Pulse: (!) 117   (!) 110  Resp: (!) 24   (!) 30  Temp: 98.6 F (37 C)     TempSrc: Oral     SpO2: (!) 87%  93% 91%  Weight:  113.4 kg    Height:  5' 8"  (1.727 m)      General: appears to be stated age; alert, oriented; slightly increased work of breathing. Skin: warm, dry, no rash Head:  AT/[place] Mouth:  Oral mucosa membranes appear moist, normal dentition Neck: supple; trachea midline Heart:  RRR; did  not appreciate any M/R/G Lungs: CTAB, did not appreciate any wheezes, rales, or rhonchi Abdomen: + BS; soft, ND, NT Vascular: 2+ pedal pulses b/l; 2+ radial pulses b/l Extremities: no peripheral edema, no muscle wasting Neuro: strength and sensation intact in upper and lower extremities b/l  Labs on Admission: I have personally reviewed following labs and imaging studies  CBC: Recent Labs  Lab 06/25/20 1438  WBC 7.5  NEUTROABS 6.4  HGB 14.7  HCT 43.2  MCV 81.7  PLT 656   Basic Metabolic Panel: Recent Labs  Lab 06/25/20 1438  NA 134*  K 4.2  CL 100  CO2 22  GLUCOSE 120*  BUN 20  CREATININE 0.93  CALCIUM 8.6*   GFR: Estimated Creatinine Clearance: 108.4 mL/min (by C-G formula based on SCr of 0.93 mg/dL). Liver Function Tests: Recent Labs  Lab 06/25/20 1438  AST 129*  ALT 75*  ALKPHOS 57  BILITOT 1.1  PROT 7.5  ALBUMIN 3.4*   Recent Labs  Lab 06/25/20 1438  LIPASE 29   No results for input(s): AMMONIA in the last 168 hours. Coagulation Profile: No results for input(s): INR, PROTIME  in the last 168 hours. Cardiac Enzymes: No results for input(s): CKTOTAL, CKMB, CKMBINDEX, TROPONINI in the last 168 hours. BNP (last 3 results) No results for input(s): PROBNP in the last 8760 hours. HbA1C: No results for input(s): HGBA1C in the last 72 hours. CBG: No results for input(s): GLUCAP in the last 168 hours. Lipid Profile: No results for input(s): CHOL, HDL, LDLCALC, TRIG, CHOLHDL, LDLDIRECT in the last 72 hours. Thyroid Function Tests: No results for input(s): TSH, T4TOTAL, FREET4, T3FREE, THYROIDAB in the last 72 hours. Anemia Panel: No results for input(s): VITAMINB12, FOLATE, FERRITIN, TIBC, IRON, RETICCTPCT in the last 72 hours. Urine analysis: No results found for: COLORURINE, APPEARANCEUR, LABSPEC, [place], GLUCOSEU, HGBUR, BILIRUBINUR, KETONESUR, PROTEINUR, UROBILINOGEN, NITRITE, LEUKOCYTESUR  Radiological Exams on Admission: DG Chest 2 View  Result Date: 06/25/2020 CLINICAL DATA:  Shortness of breath EXAM: CHEST - 2 VIEW COMPARISON:  None. FINDINGS: Cardiac shadow is within normal limits. Lungs are well aerated bilaterally with evidence bilateral airspace opacities consistent with the known history of COVID-19 positivity. No sizable effusion is seen. No bony abnormality is noted. IMPRESSION: Patchy airspace opacities consistent with COVID-19 pneumonia. Electronically Signed   By: Inez Catalina M.D.   On: 06/25/2020 15:31     EKG: Independently reviewed, with result as described above.    Assessment/Plan   Derek Cook is a 59 y.o. male with medical history significant for mild persistent asthma, GERD, who is admitted to [facility] on 06/25/2020 with acute hypoxic respiratory failure in the setting of severe COVID-19 pneumonia after presenting from home to [facility] Emergency Department complaining of shortness of breath.    Principal Problem:   Pneumonia due to COVID-19 virus Active Problems:   Hyperlipidemia   GERD  (gastroesophageal reflux disease)   Acute respiratory failure with hypoxia (HCC)   SOB (shortness of breath)    #) Severe COVID-19 pneumonia: Diagnosis on the basis of 1 week of progressive shortness of breath associated with nonproductive cough, acute hypoxic respiratory failure, and recent COVID-19 PCR positive result as an outpatient on 06/21/2020, which was confirmed via Covid PCR performed in the ED today.  Additionally, presenting chest x-ray shows evidence of patchy airspace opacities bilaterally consistent with COVID-19 pneumonia.  In the setting of acute hypoxia, criteria are met for the patient's COVID-19 infection to be considered severe in  nature.  Consequently, there is a grade 2C recommendation for dexamethasone, which is further supported by treatment guidance recommendations from [facility]'s Covid treatment committee.  Additionally, criteria are met for initiation of remdesivir on the basis of symptomatic COVID-19 infection requiring hospitalization per treatment guidance recommendations from [facility] COVID-19 treatment committee.  Of note, ALT noted to be less than 220.  Therefore no contraindication for initiation of remdesivir on the basis of transaminitis. This patient is at increased risk for a complicated clinical course relating to presenting COVID-19 infection on the basis of the following co-morbidities: Obesity.  However, he denies any known history of underlying hypertension, heart failure, or diabetes.  Reports that he carries a diagnosis of mild persistent asthma, and acknowledges that he is a former smoker, having completely quit smoking greater than 10 years ago after less than 10 years of smoking 1 to 1.5 packs/day. Denies any known or suspected COVID-19 exposures, but acknowledges a recent trip to the state of [place]. Vaccine status: The patient confirms that he is currently unvaccinated.  Of note, SIRS criteria not met for presentation to be consistent with sepsis at  this time.   Plan: Airborne and contact precautions.  Monitor continuous pulse oximetry.  As needed supplemental oxygen in order to maintain oxygen saturations greater than or equal to 90%.  Proning protocol initiated.  Monitor on symmetry.  As needed albuterol inhaler.  Continue home scheduled Advair.  As needed acetaminophen for fever.  Start dexamethasone and remdesivir, as above check inflammatory markers, including fibrinogen, CRP, ferritin, and LDH.  Check serum magnesium and phosphorus levels.  Check CMP and CBC in the morning. Check ABG for the purpose of evaluating PaO2 to FiO2 ratio.      #) Acute hypoxic respiratory failure: In the context of patient's report of no baseline supplemental oxygen requirements, presenting oxygen saturations noted to be in the mid 80s on room air, and have subsequently improved into the low 90s on 4 L nasal cannula.  This appears to be the basis of severe COVID-19 pneumonia, as further discussed above.  ACS felt to be less likely in the absence of any associated chest discomfort, while high-sensitivity troponin I found to be nonelevated in spite of ongoing symptoms for approximately 1 week.  Additionally EKG shows no evidence of acute ischemic changes.  No clinical or radiographic evidence to suggest acute decompensated heart failure.  Acute pulmonary embolism remains in the differential, with clinical correlation of subsequent trend in patient's status to assist with determination of need for CTA of the chest.  Plan: Work-up and management of presenting severe COVID-19 pneumonia, as further described above, including initiation of remdesivir.  Monitor continuous pulse oximetry.  Check ABG.  Add on serum magnesium and phosphorus levels.  May consider dose of IV serum magnesium given the patient's report of a history of asthma.  Monitor on telemetry.  Continue home scheduled Advair.  As needed albuterol inhaler.       #) Hyperlipidemia: The patient acknowledges  a diagnosis of such, and reports that this is managed via lifestyle modifications and closely monitored by his PCP.  Plan: Ongoing outpatient monitoring via PCP evaluation.     #) GERD: On omeprazole as an outpatient.  Plan: Continue on PPI.     DVT prophylaxis: Lovenox 40 g subcu daily Code Status: Full code Family Communication: none Disposition Plan: Per Rounding Team Consults called: none  Admission status: Inpatient; stepdown unit  COVID-19 Vaccination status: The patient confirms that he  is currently unvaccinated.    PLEASE NOTE THAT DRAGON DICTATION SOFTWARE WAS USED IN THE CONSTRUCTION OF THIS NOTE.   [facility] Pager [phone] From 12PM- 12AM  Otherwise, please contact night-coverage  [email or web address] Password [email or web address]  06/25/2020, 4:40 PM

## 2020-06-25 NOTE — Progress Notes (Signed)
Remdesivir - Pharmacy Brief Note   O:  ALT: 75 CXR: Patchy airspace opacities consistent with COVID-19 pneumonia. SpO2: 91-93% on 4 L [place]   A/P:  Remdesivir 200 mg IVPB once followed by 100 mg IVPB daily x 4 days.   Pernell Dupre, PharmD, BCPS Clinical Pharmacist 06/25/2020 3:58 PM  06/25/2020 3:58 PM

## 2020-06-25 NOTE — ED Triage Notes (Signed)
Pt presents to ED via POV with c/o SOB, weakness, diarrhea. Pt states dx 4 days ago, had infusion on Saturday, initially felt better after infusion, now is having increasing SOB, RA sats 88% on arrival to ED. Pt noted to be A-febrile on arrival to ED.

## 2020-06-25 NOTE — Telephone Encounter (Signed)
Pt is Covid positive, tested Friday, resulted Saturday, had infusion Sunday. States "Feeling better until yesterday." States SOB, SAt at 78% yesterday, called EMS. States when EMS arrived O2 sat at 88% "And my breathing had calmed down." States "EMS said his color was good, finger nails had color, not dehydrated and he would just have to sit and wait and should consider not going." Pt sounds distressed, speaking in short phrases, speech halting.HAs been using inhalers, ineffective. States afebrile, "96" Asked to check sat during call, "I don\'t have the energy to get up and get it." Also reports incontinent stool at times "Diarrhea since last Monday." Reports cough. SOB at rest. Advised ED, pt states will follow disposition, wife to drive. Will contact hospital before arriving to alert them covid positive. Pt verbalizes understanding. Reason for Disposition  MODERATE difficulty breathing (e.g., speaks in phrases, SOB even at rest, pulse 100-120)  Answer Assessment - Initial Assessment Questions 1. COVID-19 DIAGNOSIS: "Who made your Coronavirus (COVID-19) diagnosis?" "Was it confirmed by a positive lab test?" If not diagnosed by a HCP, ask "Are there lots of cases (community spread) where you live?" (See public health department website, if unsure)     Saturday resulted, tested Friday 2. COVID-19 EXPOSURE: "Was there any known exposure to COVID before the symptoms began?" CDC Definition of close contact: within 6 feet (2 meters) for a total of 15 minutes or more over a 24-hour period.       3. ONSET: "When did the COVID-19 symptoms start?"      Yesterday 4. WORST SYMPTOM: "What is your worst symptom?" (e.g., cough, fever, shortness of breath, muscle aches)     SOB 5. COUGH: "Do you have a cough?" If Yes, ask: "How bad is the cough?"       Cough, productive for whitish phlegm. 6. FEVER: "Do you have a fever?" If Yes, ask: "What is your temperature, how was it measured, and when did it start?"      98 .0 7. RESPIRATORY STATUS: "Describe your breathing?" (e.g., shortness of breath, wheezing, unable to speak)      SOB 8. BETTER-SAME-WORSE: "Are you getting better, staying the same or getting worse compared to yesterday?"  If getting worse, ask, "In what way?"     SAme 9. HIGH RISK DISEASE: "Do you have any chronic medical problems?" (e.g., asthma, heart or lung disease, weak immune system, obesity, etc.)     *No Answer*  11. OTHER SYMPTOMS: "Do you have any other symptoms?"  (e.g., chills, fatigue, headache, loss of smell or taste, muscle pain, sore throat; new loss of smell or taste especially support the diagnosis of COVID-19)      Diarrhea onset last Monday or Tuesday,Some incontinence, "Small amounts in diaper." Cramps in calves, resolved yesterday.  Protocols used: CORONAVIRUS (COVID-19) DIAGNOSED OR SUSPECTED-A-AH

## 2020-06-26 DIAGNOSIS — J9601 Acute respiratory failure with hypoxia: Secondary | ICD-10-CM | POA: Diagnosis not present

## 2020-06-26 DIAGNOSIS — J1282 Pneumonia due to coronavirus disease 2019: Secondary | ICD-10-CM | POA: Diagnosis not present

## 2020-06-26 DIAGNOSIS — U071 COVID-19: Secondary | ICD-10-CM | POA: Diagnosis not present

## 2020-06-26 LAB — COMPREHENSIVE METABOLIC PANEL
ALT: 70 U/L — ABNORMAL HIGH (ref 0–44)
AST: 105 U/L — ABNORMAL HIGH (ref 15–41)
Albumin: 3.2 g/dL — ABNORMAL LOW (ref 3.5–5.0)
Alkaline Phosphatase: 60 U/L (ref 38–126)
Anion gap: 13 (ref 5–15)
BUN: 21 mg/dL — ABNORMAL HIGH (ref 6–20)
CO2: 22 mmol/L (ref 22–32)
Calcium: 8.7 mg/dL — ABNORMAL LOW (ref 8.9–10.3)
Chloride: 103 mmol/L (ref 98–111)
Creatinine, Ser: 0.98 mg/dL (ref 0.61–1.24)
GFR, Estimated: >60 mL/min (ref >60)
Glucose, Bld: 147 mg/dL — ABNORMAL HIGH (ref 70–99)
Potassium: 4.1 mmol/L (ref 3.5–5.1)
Sodium: 138 mmol/L (ref 135–145)
Total Bilirubin: 0.9 mg/dL (ref 0.3–1.2)
Total Protein: 6.7 g/dL (ref 6.5–8.1)

## 2020-06-26 LAB — BLOOD GAS, ARTERIAL
Acid-base deficit: 2.2 mmol/L — ABNORMAL HIGH (ref 0.0–2.0)
Bicarbonate: 21.4 mmol/L (ref 20.0–28.0)
FIO2: 0.4
O2 Saturation: 92.2 %
Patient temperature: 37
pCO2 arterial: 33 mmHg (ref 32.0–48.0)
pH, Arterial: 7.42 (ref 7.350–7.450)
pO2, Arterial: 63 mmHg — ABNORMAL LOW (ref 83.0–108.0)

## 2020-06-26 LAB — URINALYSIS, COMPLETE (UACMP) WITH MICROSCOPIC
Bacteria, UA: NONE SEEN
Bilirubin Urine: NEGATIVE
Glucose, UA: NEGATIVE mg/dL
Hgb urine dipstick: NEGATIVE
Ketones, ur: 20 mg/dL — AB
Leukocytes,Ua: NEGATIVE
Nitrite: NEGATIVE
Protein, ur: 30 mg/dL — AB
Specific Gravity, Urine: 1.019 (ref 1.005–1.030)
Squamous Epithelial / HPF: NONE SEEN (ref 0–5)
pH: 6 (ref 5.0–8.0)

## 2020-06-26 LAB — CBC WITH DIFFERENTIAL/PLATELET
Abs Immature Granulocytes: 0.02 10*3/uL (ref 0.00–0.07)
Basophils Absolute: 0 10*3/uL (ref 0.0–0.1)
Basophils Relative: 0 %
Eosinophils Absolute: 0 10*3/uL (ref 0.0–0.5)
Eosinophils Relative: 0 %
HCT: 42.6 % (ref 39.0–52.0)
Hemoglobin: 14.2 g/dL (ref 13.0–17.0)
Immature Granulocytes: 1 %
Lymphocytes Relative: 15 %
Lymphs Abs: 0.6 10*3/uL — ABNORMAL LOW (ref 0.7–4.0)
MCH: 28.1 pg (ref 26.0–34.0)
MCHC: 33.3 g/dL (ref 30.0–36.0)
MCV: 84.2 fL (ref 80.0–100.0)
Monocytes Absolute: 0.3 10*3/uL (ref 0.1–1.0)
Monocytes Relative: 7 %
Neutro Abs: 3 10*3/uL (ref 1.7–7.7)
Neutrophils Relative %: 77 %
Platelets: 222 10*3/uL (ref 150–400)
RBC: 5.06 MIL/uL (ref 4.22–5.81)
RDW: 14.7 % (ref 11.5–15.5)
WBC: 3.8 10*3/uL — ABNORMAL LOW (ref 4.0–10.5)
nRBC: 0 % (ref 0.0–0.2)

## 2020-06-26 LAB — C-REACTIVE PROTEIN: CRP: 17.3 mg/dL — ABNORMAL HIGH (ref <1.0)

## 2020-06-26 LAB — GLUCOSE, CAPILLARY: Glucose-Capillary: 128 mg/dL — ABNORMAL HIGH (ref 70–99)

## 2020-06-26 LAB — PHOSPHORUS: Phosphorus: 4.2 mg/dL (ref 2.5–4.6)

## 2020-06-26 LAB — HIV ANTIBODY (ROUTINE TESTING W REFLEX): HIV Screen 4th Generation wRfx: NONREACTIVE

## 2020-06-26 LAB — FERRITIN: Ferritin: 1626 ng/mL — ABNORMAL HIGH (ref 24–336)

## 2020-06-26 LAB — FIBRINOGEN: Fibrinogen: 726 mg/dL — ABNORMAL HIGH (ref 210–475)

## 2020-06-26 LAB — PROCALCITONIN: Procalcitonin: 0.63 ng/mL

## 2020-06-26 LAB — MAGNESIUM: Magnesium: 2.5 mg/dL — ABNORMAL HIGH (ref 1.7–2.4)

## 2020-06-26 LAB — LACTATE DEHYDROGENASE: LDH: 459 U/L — ABNORMAL HIGH (ref 98–192)

## 2020-06-26 MED ORDER — LABETALOL HCL 5 MG/ML IV SOLN: 10.0000 mg | Freq: Four times a day (QID) | INTRAVENOUS | Status: DC | PRN

## 2020-06-26 MED ORDER — IPRATROPIUM-ALBUTEROL 20-100 MCG/ACT IN AERS
1.0000 | INHALATION_SPRAY | Freq: Four times a day (QID) | RESPIRATORY_TRACT | Status: DC
  Administered 2020-06-26 – 2020-07-14 (×70): 1 via RESPIRATORY_TRACT
  Filled 2020-06-26: qty 4

## 2020-06-26 NOTE — ED Notes (Signed)
DO at bedside 

## 2020-06-26 NOTE — ED Notes (Signed)
Patient attempted to use bedside commode. Patient became SOB with spO2 dropping to 85% on 5L Derek Cook. Patient helped back into bed and changed by this nurse. SPO2 now 90% on 5L  after resting in bed.

## 2020-06-26 NOTE — ED Notes (Signed)
Pt given pillow at this time, no other needs

## 2020-06-26 NOTE — ED Notes (Signed)
Pt brief changed at this time, new blankets given

## 2020-06-26 NOTE — Hospital Course (Signed)
Derek Cook is a 59 y.o. male with medical history significant for COPD, mild persistent asthma, GERD, who presented to the ED on 06/25/2020 with 1 week history of progressive shortness of breath, nonproductive cough, increased fatigue.  Recent travel to [place], no known sick Covid contacts, but is unvaccinated.  Admitted for acute respiratory failure with hypoxia due to severe Covid-19 pneumonia.

## 2020-06-26 NOTE — ED Notes (Signed)
Patient states he did not want meal tray that was brought and would prefer broth. Dietary was called by this nurse and they stated that a broth tray would be brought down around 1730.

## 2020-06-26 NOTE — Progress Notes (Signed)
PROGRESS NOTE    Derek Cook   QVZ:[id number]  DOB: 13-Jun-1961  PCP: Olin Hauser, DO    DOA: 06/25/2020 LOS: 1   Brief Narrative   Derek Cook is a 59 y.o. male with medical history significant for COPD, mild persistent asthma, GERD, who presented to the ED on 06/25/2020 with 1 week history of progressive shortness of breath, nonproductive cough, increased fatigue.  Recent travel to [place], no known sick Covid contacts, but is unvaccinated.  Admitted for acute respiratory failure with hypoxia due to severe Covid-19 pneumonia.     Assessment & Plan   Principal Problem:   Pneumonia due to COVID-19 virus Active Problems:   Hyperlipidemia   GERD (gastroesophageal reflux disease)   Acute respiratory failure with hypoxia (HCC)   SOB (shortness of breath)  Acute respiratory failure with hypoxia due to Severe COVID-19 pneumonia - tested positive as outpatient on 06/11/20.  Presented with 1 week worsening SOB, cough, hypoxia and confirmed Covid-19 positive by PCR.   --Continue remdesevir (10/19 >> ) --Continue dexamethasone (10/19 >> ) --Tylenol PRN fever, mild pain --Antitussives, Combivent q6h --Follow pending CRP --Consider baricitinib if rising oxygen requirement or inflammatory markers.  --Follow inflammatory markers, CMP, CBC daily --Supplemental O2 as needed, maintain spO2>90%, wean as tolerated   Hyperlipidemia - not on statin, managed by lifestyle.  PCP follow up.  GERD - on PPI  Obesity: Body mass index is 38.01 kg/m.  Complicates overall care and prognosis.   DVT prophylaxis: Lovenox   Diet:  Diet Orders (From admission, onward)    Start     Ordered   06/25/20 1834  Diet regular Room service appropriate? Yes; Fluid consistency: Thin  Diet effective now       Question Answer Comment  Room service appropriate? Yes   Fluid consistency: Thin      06/25/20 1835            Code Status: Full Code    Subjective 06/26/20    Pt seen today in ED  holding for a bed. Says he is feeling a lot better than when he came in yesterday.  Reports continues to have watery diarrhea which was going on at home as well.  Appetite mildly improved, but eating makes digestive issues worse.     Disposition Plan & Communication   Status is: Inpatient  Remains inpatient appropriate because:IV treatments appropriate due to intensity of illness or inability to take PO   Dispo: The patient is from: Home              Anticipated d/c is to: Home              Anticipated d/c date is: 3 days              Patient currently is not medically stable to d/c.    Family Communication: none at bedside, will attempt to call     Consults, Procedures, Significant Events   Consultants:   None  Procedures:   None  Antimicrobials:  Anti-infectives (From admission, onward)   Start     Dose/Rate Route Frequency Ordered Stop   06/26/20 1000  remdesivir 100 mg in sodium chloride 0.9 % 100 mL IVPB       "Followed by" Linked Group Details   100 mg 200 mL/hr over 30 Minutes Intravenous Daily 06/25/20 1603 06/30/20 0959   06/25/20 1700  remdesivir 200 mg in sodium chloride 0.9% 250 mL IVPB       "  Followed by" Linked Group Details   200 mg 580 mL/hr over 30 Minutes Intravenous Once 06/25/20 1603 06/25/20 1815        Objective   Vitals:   06/26/20 1200 06/26/20 1300 06/26/20 1500 06/26/20 1600  BP: 132/77 138/79 (!) 153/68 (!) 146/76  Pulse: 87 87 83 87  Resp: (!) 23  (!) 22 (!) 21  Temp:      TempSrc:      SpO2: 94% 94% 95% 95%  Weight:      Height:       No intake or output data in the 24 hours ending 06/26/20 1844 Filed Weights   06/25/20 1449  Weight: 113.4 kg    Physical Exam:  General exam: awake, alert, no acute distress HEENT: moist mucus membranes, hearing grossly normal  Respiratory system: decreased breath sounds, shallow inspirations, no wheezes, rales or rhonchi, mildly increased respiratory effort Cardiovascular system: normal  S1/S2, RRR, no pedal edema.   Gastrointestinal system: soft, NT, ND, +bowel sounds. Central nervous system: A&O x3. no gross focal neurologic deficits, normal speech Skin: dry, intact, normal temperature Psychiatry: normal mood, congruent affect, judgement and insight appear normal  Labs   Data Reviewed: I have personally reviewed following labs and imaging studies  CBC: Recent Labs  Lab 06/25/20 1438 06/26/20 0534  WBC 7.5 3.8*  NEUTROABS 6.4 3.0  HGB 14.7 14.2  HCT 43.2 42.6  MCV 81.7 84.2  PLT 242 622   Basic Metabolic Panel: Recent Labs  Lab 06/25/20 1438 06/26/20 0534  NA 134* 138  K 4.2 4.1  CL 100 103  CO2 22 22  GLUCOSE 120* 147*  BUN 20 21*  CREATININE 0.93 0.98  CALCIUM 8.6* 8.7*  MG 2.4 2.5*  PHOS  --  4.2   GFR: Estimated Creatinine Clearance: 102.9 mL/min (by C-G formula based on SCr of 0.98 mg/dL). Liver Function Tests: Recent Labs  Lab 06/25/20 1438 06/26/20 0534  AST 129* 105*  ALT 75* 70*  ALKPHOS 57 60  BILITOT 1.1 0.9  PROT 7.5 6.7  ALBUMIN 3.4* 3.2*   Recent Labs  Lab 06/25/20 1438  LIPASE 29   No results for input(s): AMMONIA in the last 168 hours. Coagulation Profile: No results for input(s): INR, PROTIME in the last 168 hours. Cardiac Enzymes: No results for input(s): CKTOTAL, CKMB, CKMBINDEX, TROPONINI in the last 168 hours. BNP (last 3 results) No results for input(s): PROBNP in the last 8760 hours. HbA1C: No results for input(s): HGBA1C in the last 72 hours. CBG: No results for input(s): GLUCAP in the last 168 hours. Lipid Profile: No results for input(s): CHOL, HDL, LDLCALC, TRIG, CHOLHDL, LDLDIRECT in the last 72 hours. Thyroid Function Tests: No results for input(s): TSH, T4TOTAL, FREET4, T3FREE, THYROIDAB in the last 72 hours. Anemia Panel: Recent Labs    06/26/20 0534  FERRITIN 1,626*   Sepsis Labs: Recent Labs  Lab 06/25/20 1438 06/26/20 0922  PROCALCITON  --  0.63  LATICACIDVEN 1.8  --     Recent  Results (from the past 240 hour(s))  SARS CORONAVIRUS 2 (TAT 6-24 HRS) Nasopharyngeal Nasopharyngeal Swab     Status: Abnormal   Collection Time: 06/21/20 11:36 AM   Specimen: Nasopharyngeal Swab  Result Value Ref Range Status   SARS Coronavirus 2 POSITIVE (A) NEGATIVE Final    Comment: Emailed Latrelle Dodrill @ 2131 on 06/21/2020 by Braxton Feathers (NOTE) SARS-CoV-2 target nucleic acids are DETECTED.  The SARS-CoV-2 RNA is generally detectable in upper and lower respiratory  specimens during the acute phase of infection. Positive results are indicative of the presence of SARS-CoV-2 RNA. Clinical correlation with patient history and other diagnostic information is  necessary to determine patient infection status. Positive results do not rule out bacterial infection or co-infection with other viruses.  The expected result is Negative.  Fact Sheet for Patients: SugarRoll.be  Fact Sheet for Healthcare Providers: https://www.woods-mathews.com/  This test is not yet approved or cleared by the [place] FDA and  has been authorized for detection and/or diagnosis of SARS-CoV-2 by FDA under an Emergency Use Authorization (EUA). This EUA will remain  in effect (meaning this test can be used) for the duration of the COV ID-19 declaration under Section 564(b)(1) of the Act, 21 U.S.C. section 360bbb-3(b)(1), unless the authorization is terminated or revoked sooner.   Performed at [facility], [place] [address] [place], [place] [address]   Rapid Influenza A&B Antigens     Status: None   Collection Time: 06/21/20 11:36 AM   Specimen: Flu Kit Nasopharyngeal Swab; Respiratory  Result Value Ref Range Status   Influenza A ([facility]) NEGATIVE NEGATIVE Final   Influenza B ([facility]) NEGATIVE NEGATIVE Final    Comment: Negative results do not exclude influenza virus infection, and influenza should still be considered if clinical suspicion is high. Performed at  [facility], [address] [place], [place] [address]       Imaging Studies   DG Chest 2 View  Result Date: 06/25/2020 CLINICAL DATA:  Shortness of breath EXAM: CHEST - 2 VIEW COMPARISON:  None. FINDINGS: Cardiac shadow is within normal limits. Lungs are well aerated bilaterally with evidence bilateral airspace opacities consistent with the known history of COVID-19 positivity. No sizable effusion is seen. No bony abnormality is noted. IMPRESSION: Patchy airspace opacities consistent with COVID-19 pneumonia. Electronically Signed   By: Inez Catalina M.D.   On: 06/25/2020 15:31     Medications   Scheduled Meds: . aspirin EC  81 mg Oral Daily  . dexamethasone  6 mg Oral Daily  . enoxaparin (LOVENOX) injection  0.5 mg/kg Subcutaneous Q24H  . Ipratropium-Albuterol  1 puff Inhalation Q6H  . mometasone-formoterol  2 puff Inhalation BID  . pantoprazole  40 mg Oral Daily   Continuous Infusions: . remdesivir 100 mg in NS 100 mL Stopped (06/26/20 1010)       LOS: 1 day    Time spent: 30 minutes    Ezekiel Slocumb, DO [facility]  06/26/2020, 6:44 PM    If 7PM-7AM, please contact night-coverage. How to contact the [facility] Attending or Consulting provider [place] or covering provider during after hours [place], for this patient?    1. Check the care team in [facility] and look for a) attending/consulting [facility] provider listed and b) the [facility] team listed 2. Log into [email or web address] and use [place]'s universal password to access. If you do not have the password, please contact the hospital operator. 3. Locate the [facility] provider you are looking for under [facility] and page to a number that you can be directly reached. 4. If you still have difficulty reaching the provider, please page the [facility] (Director on Call) for the Hospitalists listed on amion for assistance.

## 2020-06-27 DIAGNOSIS — R0602 Shortness of breath: Secondary | ICD-10-CM | POA: Diagnosis not present

## 2020-06-27 DIAGNOSIS — E785 Hyperlipidemia, unspecified: Secondary | ICD-10-CM | POA: Diagnosis not present

## 2020-06-27 DIAGNOSIS — J9601 Acute respiratory failure with hypoxia: Secondary | ICD-10-CM | POA: Diagnosis not present

## 2020-06-27 DIAGNOSIS — U071 COVID-19: Secondary | ICD-10-CM | POA: Diagnosis not present

## 2020-06-27 LAB — CBC
HCT: 41.3 % (ref 39.0–52.0)
Hemoglobin: 13.9 g/dL (ref 13.0–17.0)
MCH: 28.5 pg (ref 26.0–34.0)
MCHC: 33.7 g/dL (ref 30.0–36.0)
MCV: 84.8 fL (ref 80.0–100.0)
Platelets: 299 10*3/uL (ref 150–400)
RBC: 4.87 MIL/uL (ref 4.22–5.81)
RDW: 14.5 % (ref 11.5–15.5)
WBC: 8.4 10*3/uL (ref 4.0–10.5)
nRBC: 0 % (ref 0.0–0.2)

## 2020-06-27 LAB — COMPREHENSIVE METABOLIC PANEL
ALT: 72 U/L — ABNORMAL HIGH (ref 0–44)
AST: 75 U/L — ABNORMAL HIGH (ref 15–41)
Albumin: 3 g/dL — ABNORMAL LOW (ref 3.5–5.0)
Alkaline Phosphatase: 63 U/L (ref 38–126)
Anion gap: 13 (ref 5–15)
BUN: 21 mg/dL — ABNORMAL HIGH (ref 6–20)
CO2: 20 mmol/L — ABNORMAL LOW (ref 22–32)
Calcium: 8.3 mg/dL — ABNORMAL LOW (ref 8.9–10.3)
Chloride: 105 mmol/L (ref 98–111)
Creatinine, Ser: 0.74 mg/dL (ref 0.61–1.24)
GFR, Estimated: >60 mL/min (ref >60)
Glucose, Bld: 134 mg/dL — ABNORMAL HIGH (ref 70–99)
Potassium: 4.5 mmol/L (ref 3.5–5.1)
Sodium: 138 mmol/L (ref 135–145)
Total Bilirubin: 0.9 mg/dL (ref 0.3–1.2)
Total Protein: 6.5 g/dL (ref 6.5–8.1)

## 2020-06-27 LAB — MAGNESIUM: Magnesium: 2.6 mg/dL — ABNORMAL HIGH (ref 1.7–2.4)

## 2020-06-27 LAB — PROCALCITONIN: Procalcitonin: 0.51 ng/mL

## 2020-06-27 LAB — C-REACTIVE PROTEIN: CRP: 8.4 mg/dL — ABNORMAL HIGH (ref <1.0)

## 2020-06-27 LAB — FIBRIN DERIVATIVES D-DIMER (ARMC ONLY): Fibrin derivatives D-dimer (ARMC): 3157.14 ng/mL (FEU) — ABNORMAL HIGH (ref 0.00–499.00)

## 2020-06-27 MED ORDER — SODIUM CHLORIDE 0.9 % IV SOLN
100.0000 mg | Freq: Two times a day (BID) | INTRAVENOUS | Status: DC
  Filled 2020-06-27 (×2): qty 100

## 2020-06-27 MED ORDER — SODIUM CHLORIDE 0.9 % IV SOLN
2.0000 g | INTRAVENOUS | Status: AC
  Administered 2020-06-28 – 2020-07-01 (×4): 2 g via INTRAVENOUS
  Filled 2020-06-27 (×3): qty 2
  Filled 2020-06-27: qty 20

## 2020-06-27 MED ORDER — DOXYCYCLINE HYCLATE 100 MG PO TABS
100.0000 mg | ORAL_TABLET | Freq: Two times a day (BID) | ORAL | Status: AC
  Administered 2020-06-27 – 2020-07-01 (×10): 100 mg via ORAL
  Filled 2020-06-27 (×10): qty 1

## 2020-06-27 MED ORDER — METHYLPREDNISOLONE SODIUM SUCC 125 MG IJ SOLR
100.0000 mg | Freq: Two times a day (BID) | INTRAMUSCULAR | Status: DC
  Administered 2020-06-27 – 2020-07-06 (×18): 100 mg via INTRAVENOUS
  Filled 2020-06-27 (×18): qty 2

## 2020-06-27 MED ORDER — DM-GUAIFENESIN ER 30-600 MG PO TB12
1.0000 | ORAL_TABLET | Freq: Two times a day (BID) | ORAL | Status: DC
  Administered 2020-06-27 – 2020-07-14 (×35): 1 via ORAL
  Filled 2020-06-27 (×35): qty 1

## 2020-06-27 MED ORDER — SODIUM CHLORIDE 0.9 % IV SOLN
1.0000 g | INTRAVENOUS | Status: DC
  Administered 2020-06-27: 1 g via INTRAVENOUS
  Filled 2020-06-27 (×2): qty 10

## 2020-06-27 MED ORDER — BARICITINIB 2 MG PO TABS
4.0000 mg | ORAL_TABLET | Freq: Every day | ORAL | Status: DC
  Administered 2020-06-27 – 2020-07-07 (×11): 4 mg via ORAL
  Filled 2020-06-27 (×12): qty 2

## 2020-06-27 NOTE — Plan of Care (Signed)
  Problem: Activity: Goal: Risk for activity intolerance will decrease Outcome: Progressing   Problem: Nutrition: Goal: Adequate nutrition will be maintained Outcome: Progressing   

## 2020-06-27 NOTE — Progress Notes (Signed)
PROGRESS NOTE    Derek Cook   IZT:[id number]  DOB: [birth year]  PCP: Olin Hauser, DO    DOA: 06/25/2020 LOS: 2   Brief Narrative   Derek Cook is a 59 y.o. male with medical history significant for COPD, mild persistent asthma, GERD, who presented to the ED on 06/25/2020 with 1 week history of progressive shortness of breath, nonproductive cough, increased fatigue.  Recent travel to [place], no known sick Covid contacts, but is unvaccinated.  Admitted for acute respiratory failure with hypoxia due to severe Covid-19 pneumonia.     Assessment & Plan   Principal Problem:   Pneumonia due to COVID-19 virus Active Problems:   Hyperlipidemia   GERD (gastroesophageal reflux disease)   Acute respiratory failure with hypoxia (HCC)   SOB (shortness of breath)  Acute respiratory failure with hypoxia due to Severe COVID-19 pneumonia - tested positive as outpatient on 06/11/20.  Presented with 1 week worsening SOB, cough, hypoxia and confirmed Covid-19 positive by PCR.   CRP trend: 17.3 >> 10/21: significant worsening overnight, now on heated HFNC at 35 L/min.   --Continue remdesevir (10/19 >> ) --Continue dexamethasone (10/19 >> ) --Started on baricitinib (10/21 >> ) Discussed risks/benefits in detail with patient, he consents to use of baricitinib. --Tylenol PRN fever, mild pain --Antitussives, Combivent q6h --Follow inflammatory markers, CMP, CBC daily --Supplemental O2 as needed, maintain spO2>90%, wean as tolerated  Superimposed Community-acquired Pneumonia - elevated procalcitonin, worsening respiratory status overnight.  Start Rocephin and doxycycline for CAP coverage (allergy to azithromycin).  Hyperlipidemia - not on statin, managed by lifestyle.  PCP follow up.  GERD - on PPI  Obesity: Body mass index is 38.01 kg/m.  Complicates overall care and prognosis.   DVT prophylaxis: Lovenox   Diet:  Diet Orders (From admission, onward)    Start     Ordered    06/25/20 1834  Diet regular Room service appropriate? Yes; Fluid consistency: Thin  Diet effective now       Question Answer Comment  Room service appropriate? Yes   Fluid consistency: Thin      06/25/20 1835            Code Status: Full Code    Subjective 06/27/20    Pt seen today in ED, still holding for a bed.  He reports he had a terrible coughing fit last night, "scared everyone".  Reports very thick sputum coming up.  Denies any more diarrhea, says he is "dried out".  Enjoyed broth, taste coming back.   Disposition Plan & Communication   Status is: Inpatient  Remains inpatient appropriate because:IV treatments appropriate due to intensity of illness or inability to take PO.  Patient has worsening respiratory status and increasing oxygen requirements.   Dispo: The patient is from: Home              Anticipated d/c is to: Home              Anticipated d/c date is: > 3 days              Patient currently is not medically stable to d/c.    Family Communication: none at bedside, will attempt to call     Consults, Procedures, Significant Events   Consultants:   None  Procedures:   None  Antimicrobials:  Anti-infectives (From admission, onward)   Start     Dose/Rate Route Frequency Ordered Stop   06/27/20 1000  cefTRIAXone (ROCEPHIN) 1 g in sodium  chloride 0.9 % 100 mL IVPB        1 g 200 mL/hr over 30 Minutes Intravenous Every 24 hours 06/27/20 0854 07/02/20 0959   06/27/20 1000  doxycycline (VIBRAMYCIN) 100 mg in sodium chloride 0.9 % 250 mL IVPB  Status:  Discontinued        100 mg 125 mL/hr over 120 Minutes Intravenous Every 12 hours 06/27/20 0855 06/27/20 0937   06/27/20 1000  doxycycline (VIBRA-TABS) tablet 100 mg        100 mg Oral Every 12 hours 06/27/20 0937 07/02/20 0959   06/26/20 1000  remdesivir 100 mg in sodium chloride 0.9 % 100 mL IVPB       "Followed by" Linked Group Details   100 mg 200 mL/hr over 30 Minutes Intravenous Daily 06/25/20 1603  06/30/20 0959   06/25/20 1700  remdesivir 200 mg in sodium chloride 0.9% 250 mL IVPB       "Followed by" Linked Group Details   200 mg 580 mL/hr over 30 Minutes Intravenous Once 06/25/20 1603 06/25/20 1815        Objective   Vitals:   06/27/20 1215 06/27/20 1300 06/27/20 1400 06/27/20 1433  BP:  133/84 124/77   Pulse: 85 83 79 79  Resp: (!) 28 (!) 27 (!) 31 (!) 25  Temp:      TempSrc:      SpO2: 94% 95% 97% 91%  Weight:      Height:        Intake/Output Summary (Last 24 hours) at 06/27/2020 1536 Last data filed at 06/27/2020 1044 Gross per 24 hour  Intake 300 ml  Output --  Net 300 ml   Filed Weights   06/25/20 1449  Weight: 113.4 kg    Physical Exam:  General exam: awake, alert, no acute distress HEENT: moist mucus membranes, hearing grossly normal  Respiratory system: decreased breath sounds, shallow inspirations, no wheezes, rales or rhonchi, mildly increased respiratory effort Cardiovascular system: normal S1/S2, RRR, no pedal edema.   Gastrointestinal system: soft, NT, ND, +bowel sounds. Central nervous system: A&O x3. no gross focal neurologic deficits, normal speech Skin: dry, intact, normal temperature Psychiatry: normal mood, congruent affect, judgement and insight appear normal  Labs   Data Reviewed: I have personally reviewed following labs and imaging studies  CBC: Recent Labs  Lab 06/25/20 1438 06/26/20 0534 06/27/20 0509  WBC 7.5 3.8* 8.4  NEUTROABS 6.4 3.0  --   HGB 14.7 14.2 13.9  HCT 43.2 42.6 41.3  MCV 81.7 84.2 84.8  PLT 242 222 950   Basic Metabolic Panel: Recent Labs  Lab 06/25/20 1438 06/26/20 0534 06/27/20 0509  NA 134* 138 138  K 4.2 4.1 4.5  CL 100 103 105  CO2 22 22 20*  GLUCOSE 120* 147* 134*  BUN 20 21* 21*  CREATININE 0.93 0.98 0.74  CALCIUM 8.6* 8.7* 8.3*  MG 2.4 2.5* 2.6*  PHOS  --  4.2  --    GFR: Estimated Creatinine Clearance: 126 mL/min (by C-G formula based on SCr of 0.74 mg/dL). Liver Function  Tests: Recent Labs  Lab 06/25/20 1438 06/26/20 0534 06/27/20 0509  AST 129* 105* 75*  ALT 75* 70* 72*  ALKPHOS 57 60 63  BILITOT 1.1 0.9 0.9  PROT 7.5 6.7 6.5  ALBUMIN 3.4* 3.2* 3.0*   Recent Labs  Lab 06/25/20 1438  LIPASE 29   No results for input(s): AMMONIA in the last 168 hours. Coagulation Profile: No results for input(s): INR, PROTIME  in the last 168 hours. Cardiac Enzymes: No results for input(s): CKTOTAL, CKMB, CKMBINDEX, TROPONINI in the last 168 hours. BNP (last 3 results) No results for input(s): PROBNP in the last 8760 hours. HbA1C: No results for input(s): HGBA1C in the last 72 hours. CBG: Recent Labs  Lab 06/26/20 2324  GLUCAP 128*   Lipid Profile: No results for input(s): CHOL, HDL, LDLCALC, TRIG, CHOLHDL, LDLDIRECT in the last 72 hours. Thyroid Function Tests: No results for input(s): TSH, T4TOTAL, FREET4, T3FREE, THYROIDAB in the last 72 hours. Anemia Panel: Recent Labs    06/26/20 0534  FERRITIN 1,626*   Sepsis Labs: Recent Labs  Lab 06/25/20 1438 06/26/20 0922 06/27/20 0509  PROCALCITON  --  0.63 0.51  LATICACIDVEN 1.8  --   --     Recent Results (from the past 240 hour(s))  SARS CORONAVIRUS 2 (TAT 6-24 HRS) Nasopharyngeal Nasopharyngeal Swab     Status: Abnormal   Collection Time: 06/21/20 11:36 AM   Specimen: Nasopharyngeal Swab  Result Value Ref Range Status   SARS Coronavirus 2 POSITIVE (A) NEGATIVE Final    Comment: Emailed Latrelle Dodrill @ 2131 on 06/21/2020 by Braxton Feathers (NOTE) SARS-CoV-2 target nucleic acids are DETECTED.  The SARS-CoV-2 RNA is generally detectable in upper and lower respiratory specimens during the acute phase of infection. Positive results are indicative of the presence of SARS-CoV-2 RNA. Clinical correlation with patient history and other diagnostic information is  necessary to determine patient infection status. Positive results do not rule out bacterial infection or co-infection with other viruses.   The expected result is Negative.  Fact Sheet for Patients: SugarRoll.be  Fact Sheet for Healthcare Providers: https://www.woods-mathews.com/  This test is not yet approved or cleared by the [place] FDA and  has been authorized for detection and/or diagnosis of SARS-CoV-2 by FDA under an Emergency Use Authorization (EUA). This EUA will remain  in effect (meaning this test can be used) for the duration of the COV ID-19 declaration under Section 564(b)(1) of the Act, 21 U.S.C. section 360bbb-3(b)(1), unless the authorization is terminated or revoked sooner.   Performed at [facility], [place] [address], [place], [place] [address]   Rapid Influenza A&B Antigens     Status: None   Collection Time: 06/21/20 11:36 AM   Specimen: Flu Kit Nasopharyngeal Swab; Respiratory  Result Value Ref Range Status   Influenza A ([facility]) NEGATIVE NEGATIVE Final   Influenza B ([facility]) NEGATIVE NEGATIVE Final    Comment: Negative results do not exclude influenza virus infection, and influenza should still be considered if clinical suspicion is high. Performed at [facility], [address], [place], [place] [address]       Imaging Studies   No results found.   Medications   Scheduled Meds: . aspirin EC  81 mg Oral Daily  . baricitinib  4 mg Oral Daily  . dexamethasone  6 mg Oral Daily  . dextromethorphan-guaiFENesin  1 tablet Oral BID  . doxycycline  100 mg Oral Q12H  . enoxaparin (LOVENOX) injection  0.5 mg/kg Subcutaneous Q24H  . Ipratropium-Albuterol  1 puff Inhalation Q6H  . mometasone-formoterol  2 puff Inhalation BID  . pantoprazole  40 mg Oral Daily   Continuous Infusions: . cefTRIAXone (ROCEPHIN)  IV Stopped (06/27/20 1044)  . remdesivir 100 mg in NS 100 mL Stopped (06/27/20 1014)       LOS: 2 days    Time spent: 30 minutes    Ezekiel Slocumb, DO [facility]  06/27/2020, 3:36 PM    If 7PM-7AM,  please contact night-coverage. How to contact the [facility] Attending or Consulting provider [place] or covering provider during after hours [place], for this patient?    1. Check the care team in [facility] and look for a) attending/consulting TRH provider listed and b) the [facility] team listed 2. Log into [email or web address] and use [place] universal password to access. If you do not have the password, please contact the hospital operator. 3. Locate the [facility] provider you are looking for under [facility] and page to a number that you can be directly reached. 4. If you still have difficulty reaching the provider, please page the [facility] (Director on Call) for the Hospitalists listed on amion for assistance.

## 2020-06-27 NOTE — ED Notes (Signed)
Pt placed on HFNC at this time

## 2020-06-28 ENCOUNTER — Inpatient Hospital Stay: Payer: 59

## 2020-06-28 DIAGNOSIS — U071 COVID-19: Secondary | ICD-10-CM | POA: Diagnosis not present

## 2020-06-28 DIAGNOSIS — J1282 Pneumonia due to coronavirus disease 2019: Secondary | ICD-10-CM | POA: Diagnosis not present

## 2020-06-28 DIAGNOSIS — J9601 Acute respiratory failure with hypoxia: Secondary | ICD-10-CM | POA: Diagnosis not present

## 2020-06-28 LAB — COMPREHENSIVE METABOLIC PANEL
ALT: 69 U/L — ABNORMAL HIGH (ref 0–44)
AST: 46 U/L — ABNORMAL HIGH (ref 15–41)
Albumin: 3 g/dL — ABNORMAL LOW (ref 3.5–5.0)
Alkaline Phosphatase: 64 U/L (ref 38–126)
Anion gap: 10 (ref 5–15)
BUN: 25 mg/dL — ABNORMAL HIGH (ref 6–20)
CO2: 22 mmol/L (ref 22–32)
Calcium: 8.5 mg/dL — ABNORMAL LOW (ref 8.9–10.3)
Chloride: 104 mmol/L (ref 98–111)
Creatinine, Ser: 0.8 mg/dL (ref 0.61–1.24)
GFR, Estimated: >60 mL/min (ref >60)
Glucose, Bld: 135 mg/dL — ABNORMAL HIGH (ref 70–99)
Potassium: 4.4 mmol/L (ref 3.5–5.1)
Sodium: 136 mmol/L (ref 135–145)
Total Bilirubin: 1 mg/dL (ref 0.3–1.2)
Total Protein: 6.4 g/dL — ABNORMAL LOW (ref 6.5–8.1)

## 2020-06-28 LAB — FIBRIN DERIVATIVES D-DIMER (ARMC ONLY): Fibrin derivatives D-dimer (ARMC): >7500 ng/mL (FEU) — ABNORMAL HIGH (ref 0.00–499.00)

## 2020-06-28 LAB — PROCALCITONIN: Procalcitonin: 0.25 ng/mL

## 2020-06-28 LAB — C-REACTIVE PROTEIN: CRP: 3.2 mg/dL — ABNORMAL HIGH (ref <1.0)

## 2020-06-28 MED ORDER — IOHEXOL 350 MG/ML SOLN
75.0000 mL | Freq: Once | INTRAVENOUS | Status: AC | PRN
  Administered 2020-06-28: 75 mL via INTRAVENOUS

## 2020-06-28 NOTE — Progress Notes (Signed)
PROGRESS NOTE    Derek Cook   OVZ:[id number]  DOB: February 09, 1961  PCP: Olin Hauser, DO    DOA: 06/25/2020 LOS: 3   Brief Narrative   Derek Cook is a 60 y.o. male with medical history significant for COPD, mild persistent asthma, GERD, who presented to the ED on 06/25/2020 with 1 week history of progressive shortness of breath, nonproductive cough, increased fatigue.  Recent travel to [place], no known sick Covid contacts, but is unvaccinated.  Admitted for acute respiratory failure with hypoxia due to severe Covid-19 pneumonia.     Assessment & Plan   Principal Problem:   Pneumonia due to COVID-19 virus Active Problems:   Hyperlipidemia   GERD (gastroesophageal reflux disease)   Acute respiratory failure with hypoxia (HCC)   SOB (shortness of breath)  Acute respiratory failure with hypoxia due to Severe COVID-19 pneumonia - tested positive as outpatient on 06/11/20.  Presented with 1 week worsening SOB, cough, hypoxia and confirmed Covid-19 positive by PCR.   CRP trend: 17.3 >> 10/21: significant worsening overnight, now on heated HFNC at 35 L/min.   10/22: oxygen requirement increased to 55 L/min since yesterday.  CTA chest obtained negative for PE, showed multifocal PNA. --Continue remdesevir (10/19 >> ) --Continue IV Solu-medrol 100 mg BID (10/19 >> ) (initially on Decadron) --Started on baricitinib (10/21 >> ) --Tylenol PRN fever, mild pain --Antitussives, Combivent q6h, Dulera BID --Follow inflammatory markers, CMP, CBC daily --Supplemental O2 as needed, maintain spO2>90%, wean as tolerated --Will check lower extremity venous duplex to rule out DVT's  Superimposed Community-acquired Pneumonia - elevated procalcitonin, worsening respiratory status overnight.  Start Rocephin and doxycycline for CAP coverage (allergy to azithromycin).  Hyperlipidemia - not on statin, managed by lifestyle.  PCP follow up.  GERD - on PPI  Obesity: Body mass index is 34.38  kg/m.  Complicates overall care and prognosis.   DVT prophylaxis: Lovenox   Diet:  Diet Orders (From admission, onward)    Start     Ordered   06/25/20 1834  Diet regular Room service appropriate? Yes; Fluid consistency: Thin  Diet effective now       Question Answer Comment  Room service appropriate? Yes   Fluid consistency: Thin      06/25/20 1835            Code Status: Full Code    Subjective 06/28/20    Pt seen at bedside today after CT scan.  He reports feeling overall well.  Continues to have diarrhea after eating, says this has been an ongoing issue for awhile.  No fever/chills.  Denies shortness of breath at rest.  He was happy to hear no PE seen on his CT scan today.     Disposition Plan & Communication   Status is: Inpatient  Remains inpatient appropriate because:IV treatments appropriate due to intensity of illness or inability to take PO.  Patient has worsening respiratory status and increasing oxygen requirements.   Dispo: The patient is from: Home              Anticipated d/c is to: Home              Anticipated d/c date is: > 3 days              Patient currently is not medically stable to d/c.    Family Communication: spoke to patient's wife, Derek Cook, by phone today 10/22.   Consults, Procedures, Significant Events   Consultants:  None  Procedures:   None  Antimicrobials:  Anti-infectives (From admission, onward)   Start     Dose/Rate Route Frequency Ordered Stop   06/28/20 1000  cefTRIAXone (ROCEPHIN) 2 g in sodium chloride 0.9 % 100 mL IVPB        2 g 200 mL/hr over 30 Minutes Intravenous Every 24 hours 06/27/20 1600 07/02/20 0959   06/27/20 1000  cefTRIAXone (ROCEPHIN) 1 g in sodium chloride 0.9 % 100 mL IVPB  Status:  Discontinued        1 g 200 mL/hr over 30 Minutes Intravenous Every 24 hours 06/27/20 0854 06/27/20 1600   06/27/20 1000  doxycycline (VIBRAMYCIN) 100 mg in sodium chloride 0.9 % 250 mL IVPB  Status:  Discontinued          100 mg 125 mL/hr over 120 Minutes Intravenous Every 12 hours 06/27/20 0855 06/27/20 0937   06/27/20 1000  doxycycline (VIBRA-TABS) tablet 100 mg        100 mg Oral Every 12 hours 06/27/20 0937 07/02/20 0959   06/26/20 1000  remdesivir 100 mg in sodium chloride 0.9 % 100 mL IVPB       "Followed by" Linked Group Details   100 mg 200 mL/hr over 30 Minutes Intravenous Daily 06/25/20 1603 06/30/20 0959   06/25/20 1700  remdesivir 200 mg in sodium chloride 0.9% 250 mL IVPB       "Followed by" Linked Group Details   200 mg 580 mL/hr over 30 Minutes Intravenous Once 06/25/20 1603 06/25/20 1815        Objective   Vitals:   06/28/20 1228 06/28/20 1327 06/28/20 1500 06/28/20 1530  BP: 116/71 120/73 139/65   Pulse: 87  82   Resp: (!) 28  (!) 23 (!) 23  Temp: 97.9 F (36.6 C) 98 F (36.7 C)    TempSrc: Oral     SpO2: 94%  95%   Weight:      Height:        Intake/Output Summary (Last 24 hours) at 06/28/2020 1831 Last data filed at 06/28/2020 1824 Gross per 24 hour  Intake 200 ml  Output 1400 ml  Net -1200 ml   Filed Weights   06/25/20 1449 06/27/20 1550  Weight: 113.4 kg 102.6 kg    Physical Exam:  General exam: awake, alert, no acute distress Respiratory system: decreased breath sounds, no wheezes, mildly increased respiratory effort but no conversational dyspnea or accsesory muscle use.  On heated HFNC at 55 L/min,70% FiO2. Cardiovascular system: normal S1/S2, RRR, no pedal edema.   Central nervous system: A&O x3. no gross focal neurologic deficits, normal speech Skin: dry, intact, normal temperature and color Psychiatry: normal mood, congruent affect, judgement and insight appear normal  Labs   Data Reviewed: I have personally reviewed following labs and imaging studies  CBC: Recent Labs  Lab 06/25/20 1438 06/26/20 0534 06/27/20 0509  WBC 7.5 3.8* 8.4  NEUTROABS 6.4 3.0  --   HGB 14.7 14.2 13.9  HCT 43.2 42.6 41.3  MCV 81.7 84.2 84.8  PLT 242 222 644    Basic Metabolic Panel: Recent Labs  Lab 06/25/20 1438 06/26/20 0534 06/27/20 0509 06/28/20 0641  NA 134* 138 138 136  K 4.2 4.1 4.5 4.4  CL 100 103 105 104  CO2 22 22 20* 22  GLUCOSE 120* 147* 134* 135*  BUN 20 21* 21* 25*  CREATININE 0.93 0.98 0.74 0.80  CALCIUM 8.6* 8.7* 8.3* 8.5*  MG 2.4 2.5* 2.6*  --  PHOS  --  4.2  --   --    GFR: Estimated Creatinine Clearance: 119.7 mL/min (by C-G formula based on SCr of 0.8 mg/dL). Liver Function Tests: Recent Labs  Lab 06/25/20 1438 06/26/20 0534 06/27/20 0509 06/28/20 0641  AST 129* 105* 75* 46*  ALT 75* 70* 72* 69*  ALKPHOS 57 60 63 64  BILITOT 1.1 0.9 0.9 1.0  PROT 7.5 6.7 6.5 6.4*  ALBUMIN 3.4* 3.2* 3.0* 3.0*   Recent Labs  Lab 06/25/20 1438  LIPASE 29   No results for input(s): AMMONIA in the last 168 hours. Coagulation Profile: No results for input(s): INR, PROTIME in the last 168 hours. Cardiac Enzymes: No results for input(s): CKTOTAL, CKMB, CKMBINDEX, TROPONINI in the last 168 hours. BNP (last 3 results) No results for input(s): PROBNP in the last 8760 hours. HbA1C: No results for input(s): HGBA1C in the last 72 hours. CBG: Recent Labs  Lab 06/26/20 2324  GLUCAP 128*   Lipid Profile: No results for input(s): CHOL, HDL, LDLCALC, TRIG, CHOLHDL, LDLDIRECT in the last 72 hours. Thyroid Function Tests: No results for input(s): TSH, T4TOTAL, FREET4, T3FREE, THYROIDAB in the last 72 hours. Anemia Panel: Recent Labs    06/26/20 0534  FERRITIN 1,626*   Sepsis Labs: Recent Labs  Lab 06/25/20 1438 06/26/20 0922 06/27/20 0509 06/28/20 0641  PROCALCITON  --  0.63 0.51 0.25  LATICACIDVEN 1.8  --   --   --     Recent Results (from the past 240 hour(s))  SARS CORONAVIRUS 2 (TAT 6-24 HRS) Nasopharyngeal Nasopharyngeal Swab     Status: Abnormal   Collection Time: 06/21/20 11:36 AM   Specimen: Nasopharyngeal Swab  Result Value Ref Range Status   SARS Coronavirus 2 POSITIVE (A) NEGATIVE Final     Comment: Emailed Latrelle Dodrill @ 2131 on 06/21/2020 by Braxton Feathers (NOTE) SARS-CoV-2 target nucleic acids are DETECTED.  The SARS-CoV-2 RNA is generally detectable in upper and lower respiratory specimens during the acute phase of infection. Positive results are indicative of the presence of SARS-CoV-2 RNA. Clinical correlation with patient history and other diagnostic information is  necessary to determine patient infection status. Positive results do not rule out bacterial infection or co-infection with other viruses.  The expected result is Negative.  Fact Sheet for Patients: SugarRoll.be  Fact Sheet for Healthcare Providers: https://www.woods-mathews.com/  This test is not yet approved or cleared by the [place] FDA and  has been authorized for detection and/or diagnosis of SARS-CoV-2 by FDA under an Emergency Use Authorization (EUA). This EUA will remain  in effect (meaning this test can be used) for the duration of the COV ID-19 declaration under Section 564(b)(1) of the Act, 21 U.S.C. section 360bbb-3(b)(1), unless the authorization is terminated or revoked sooner.   Performed at [facility], [place] [address] [place], [place] [address]   Rapid Influenza A&B Antigens     Status: None   Collection Time: 06/21/20 11:36 AM   Specimen: Flu Kit Nasopharyngeal Swab; Respiratory  Result Value Ref Range Status   Influenza A ([facility]) NEGATIVE NEGATIVE Final   Influenza B ([facility]) NEGATIVE NEGATIVE Final    Comment: Negative results do not exclude influenza virus infection, and influenza should still be considered if clinical suspicion is high. Performed at [facility], [address] [place], [place] [address]       Imaging Studies   CT ANGIO CHEST PE W OR WO CONTRAST  Result Date: 06/28/2020 CLINICAL DATA:  COVID-19  positive, high probability of pulmonary embolus. EXAM: CT ANGIOGRAPHY CHEST WITH CONTRAST  TECHNIQUE: Multidetector CT imaging of the chest was performed using the standard protocol during bolus administration of intravenous contrast. Multiplanar CT image reconstructions and MIPs were obtained to evaluate the vascular anatomy. CONTRAST:  53m OMNIPAQUE IOHEXOL 350 MG/ML SOLN COMPARISON:  None. FINDINGS: Cardiovascular: Satisfactory opacification of the pulmonary arteries to the segmental level. No evidence of pulmonary embolism. Normal heart size. No pericardial effusion. Mediastinum/Nodes: No enlarged mediastinal, hilar, or axillary lymph nodes. Thyroid gland, trachea, and esophagus demonstrate no significant findings. Lungs/Pleura: No pneumothorax or pleural effusion is noted. Multiple patchy airspace opacities are noted throughout both lungs most consistent with multifocal pneumonia due to COVID-19. Upper Abdomen: Hepatic steatosis is noted. Musculoskeletal: No chest wall abnormality. No acute or significant osseous findings. Review of the MIP images confirms the above findings. IMPRESSION: 1. No definite evidence of pulmonary embolus. 2. Multiple patchy airspace opacities are noted throughout both lungs most consistent with multifocal pneumonia due to COVID-19. 3. Hepatic steatosis. Electronically Signed   By: JMarijo ConceptionM.D.   On: 06/28/2020 12:25     Medications   Scheduled Meds: . aspirin EC  81 mg Oral Daily  . baricitinib  4 mg Oral Daily  . dextromethorphan-guaiFENesin  1 tablet Oral BID  . doxycycline  100 mg Oral Q12H  . enoxaparin (LOVENOX) injection  0.5 mg/kg Subcutaneous Q24H  . Ipratropium-Albuterol  1 puff Inhalation Q6H  . methylPREDNISolone (SOLU-MEDROL) injection  100 mg Intravenous Q12H  . mometasone-formoterol  2 puff Inhalation BID  . pantoprazole  40 mg Oral Daily   Continuous Infusions: . cefTRIAXone (ROCEPHIN)  IV 2 g (06/28/20 1010)  . remdesivir 100 mg in NS 100 mL 100 mg (06/28/20 1106)       LOS: 3 days    Time spent: 30 minutes with > 50%  spent in coordination of care and direct patient contact.    KEzekiel Slocumb DO [facility]  06/28/2020, 6:31 PM    If 7PM-7AM, please contact night-coverage. How to contact the [facility] or Consulting provider [place] covering provider during after hours [place] for this patient?    1. Check the care team in [facility] look for a) attending/consulting TRH provider listed and b) the [facility] listed 2. Log into [email or web address] and use 's universal password to access. If you do not have the password, please contact the hospital operator. 3. Locate the [facility]rent[facility] Hospitalprovider you are looking for under [facility] and page to a number that you can be directly reached. 4. If you still have difficulty reaching the provider, please page the [facility](Director on Call) for the Hospitalists listed on amion for assistance.

## 2020-06-29 ENCOUNTER — Inpatient Hospital Stay: Payer: 59

## 2020-06-29 DIAGNOSIS — U071 COVID-19: Secondary | ICD-10-CM | POA: Diagnosis not present

## 2020-06-29 DIAGNOSIS — J1282 Pneumonia due to coronavirus disease 2019: Secondary | ICD-10-CM | POA: Diagnosis not present

## 2020-06-29 DIAGNOSIS — J9601 Acute respiratory failure with hypoxia: Secondary | ICD-10-CM | POA: Diagnosis not present

## 2020-06-29 LAB — FIBRIN DERIVATIVES D-DIMER (ARMC ONLY): Fibrin derivatives D-dimer (ARMC): 5539.67 ng/mL (FEU) — ABNORMAL HIGH (ref 0.00–499.00)

## 2020-06-29 LAB — COMPREHENSIVE METABOLIC PANEL
ALT: 69 U/L — ABNORMAL HIGH (ref 0–44)
AST: 42 U/L — ABNORMAL HIGH (ref 15–41)
Albumin: 3 g/dL — ABNORMAL LOW (ref 3.5–5.0)
Alkaline Phosphatase: 57 U/L (ref 38–126)
Anion gap: 8 (ref 5–15)
BUN: 26 mg/dL — ABNORMAL HIGH (ref 6–20)
CO2: 23 mmol/L (ref 22–32)
Calcium: 8.3 mg/dL — ABNORMAL LOW (ref 8.9–10.3)
Chloride: 103 mmol/L (ref 98–111)
Creatinine, Ser: 0.94 mg/dL (ref 0.61–1.24)
GFR, Estimated: >60 mL/min (ref >60)
Glucose, Bld: 160 mg/dL — ABNORMAL HIGH (ref 70–99)
Potassium: 4.6 mmol/L (ref 3.5–5.1)
Sodium: 134 mmol/L — ABNORMAL LOW (ref 135–145)
Total Bilirubin: 0.9 mg/dL (ref 0.3–1.2)
Total Protein: 6.1 g/dL — ABNORMAL LOW (ref 6.5–8.1)

## 2020-06-29 LAB — C-REACTIVE PROTEIN: CRP: 1.3 mg/dL — ABNORMAL HIGH (ref <1.0)

## 2020-06-29 MED ORDER — RISAQUAD PO CAPS
2.0000 | ORAL_CAPSULE | Freq: Three times a day (TID) | ORAL | Status: DC
  Administered 2020-06-29 – 2020-07-14 (×45): 2 via ORAL
  Filled 2020-06-29 (×45): qty 2

## 2020-06-29 MED ORDER — GUAIFENESIN-DM 100-10 MG/5ML PO SYRP
5.0000 mL | ORAL_SOLUTION | ORAL | Status: DC | PRN
  Administered 2020-06-30 – 2020-07-14 (×4): 5 mL via ORAL
  Filled 2020-06-29 (×5): qty 5

## 2020-06-29 MED ORDER — ENOXAPARIN SODIUM 60 MG/0.6ML ~~LOC~~ SOLN
0.5000 mg/kg | SUBCUTANEOUS | Status: DC
  Administered 2020-06-29 – 2020-07-02 (×4): 52.5 mg via SUBCUTANEOUS
  Filled 2020-06-29 (×5): qty 0.6

## 2020-06-29 NOTE — Progress Notes (Signed)
PROGRESS NOTE    Derek Cook   TLX:[id number]  DOB: 1961/03/14  PCP: Olin Hauser, DO    DOA: 06/25/2020 LOS: 4   Brief Narrative   Derek Cook is a 59 y.o. male with medical history significant for COPD, mild persistent asthma, GERD, who presented to the ED on 06/25/2020 with 1 week history of progressive shortness of breath, nonproductive cough, increased fatigue.  Recent travel to [place], no known sick Covid contacts, but is unvaccinated.  Admitted for acute respiratory failure with hypoxia due to severe Covid-19 pneumonia.     Assessment & Plan   Principal Problem:   Pneumonia due to COVID-19 virus Active Problems:   Hyperlipidemia   GERD (gastroesophageal reflux disease)   Acute respiratory failure with hypoxia (HCC)   SOB (shortness of breath)  Acute respiratory failure with hypoxia due to Severe COVID-19 pneumonia - tested positive as outpatient on 06/11/20.  Presented with 1 week worsening SOB, cough, hypoxia and confirmed Covid-19 positive by PCR.   CRP trend: 17.3 >> 10/21: significant worsening overnight, now on heated HFNC at 35 L/min.   10/22: oxygen requirement increased to 55 L/min since yesterday.  CTA chest obtained negative for PE, showed multifocal PNA. 10/23: slight reduction in O2 need to 50 L/min, feeling better, LE duplex negative for DVT b/l. --Continue remdesevir (10/19 >> ) --Continue IV Solu-medrol 100 mg BID (10/19 >> ) (initially on Decadron) --Started on baricitinib (10/21 >> ) --Tylenol PRN fever, mild pain --Antitussives, Combivent q6h, Dulera BID --Follow inflammatory markers, CMP, CBC daily --Supplemental O2 as needed, maintain spO2>90%, wean as tolerated   Superimposed Community-acquired Pneumonia - elevated procalcitonin, worsening respiratory status overnight following admission.   --Continue Rocephin and doxycycline for CAP coverage (allergy to azithromycin).  Hyperlipidemia - not on statin, managed by lifestyle.  PCP  follow up.  GERD - on PPI  Obesity: Body mass index is 35.88 kg/m.  Complicates overall care and prognosis.   DVT prophylaxis: Lovenox   Diet:  Diet Orders (From admission, onward)    Start     Ordered   06/25/20 1834  Diet regular Room service appropriate? Yes; Fluid consistency: Thin  Diet effective now       Question Answer Comment  Room service appropriate? Yes   Fluid consistency: Thin      06/25/20 1835            Code Status: Full Code    Subjective 06/29/20    Pt seen at bedside this AM.  Says he feels much better today.  Reports phlegm finally breaking up and able to cough it up.  No fever/chills, but feels hot at times.  Appetite improving.  Happy to hear to blood clot in either leg on ultrasound.   Disposition Plan & Communication   Status is: Inpatient  Remains inpatient appropriate because:IV treatments appropriate due to intensity of illness or inability to take PO.  Patient has worsening respiratory status and remains with high oxygen requirements on heated high flow.   Dispo: The patient is from: Home              Anticipated d/c is to: Home              Anticipated d/c date is: > 3 days              Patient currently is not medically stable to d/c.    Family Communication: spoke to patient's wife, [name], by phone afternoon on 10/22.  Patient requested I call his brother, Wille Glaser, today.   Spoke with Wille Glaser and wife [name] this afternoon 10/23 (both retired RN's).  They will update wife today.  Brother Dondrell Loudermilk (wife [name]) [phone].  Pt gives permission to update them any time.   Consults, Procedures, Significant Events   Consultants:   None  Procedures:   None  Antimicrobials:  Anti-infectives (From admission, onward)   Start     Dose/Rate Route Frequency Ordered Stop   06/28/20 1000  cefTRIAXone (ROCEPHIN) 2 g in sodium chloride 0.9 % 100 mL IVPB        2 g 200 mL/hr over 30 Minutes Intravenous Every 24 hours 06/27/20 1600 07/02/20  0959   06/27/20 1000  cefTRIAXone (ROCEPHIN) 1 g in sodium chloride 0.9 % 100 mL IVPB  Status:  Discontinued        1 g 200 mL/hr over 30 Minutes Intravenous Every 24 hours 06/27/20 0854 06/27/20 1600   06/27/20 1000  doxycycline (VIBRAMYCIN) 100 mg in sodium chloride 0.9 % 250 mL IVPB  Status:  Discontinued        100 mg 125 mL/hr over 120 Minutes Intravenous Every 12 hours 06/27/20 0855 06/27/20 0937   06/27/20 1000  doxycycline (VIBRA-TABS) tablet 100 mg        100 mg Oral Every 12 hours 06/27/20 0937 07/02/20 0959   06/26/20 1000  remdesivir 100 mg in sodium chloride 0.9 % 100 mL IVPB       "Followed by" Linked Group Details   100 mg 200 mL/hr over 30 Minutes Intravenous Daily 06/25/20 1603 06/29/20 1020   06/25/20 1700  remdesivir 200 mg in sodium chloride 0.9% 250 mL IVPB       "Followed by" Linked Group Details   200 mg 580 mL/hr over 30 Minutes Intravenous Once 06/25/20 1603 06/25/20 1815        Objective   Vitals:   06/29/20 0900 06/29/20 0930 06/29/20 1151 06/29/20 1215  BP: 117/76   116/66  Pulse: 80   81  Resp: (!) 29 19  (!) 22  Temp: 98.2 F (36.8 C)   98 F (36.7 C)  TempSrc: Oral   Oral  SpO2: 94%  92% 96%  Weight:      Height:        Intake/Output Summary (Last 24 hours) at 06/29/2020 1612 Last data filed at 06/29/2020 1215 Gross per 24 hour  Intake --  Output 650 ml  Net -650 ml   Filed Weights   06/25/20 1449 06/27/20 1550 06/29/20 0308  Weight: 113.4 kg 102.6 kg 107 kg    Physical Exam:  General exam: awake, alert, no acute distress Respiratory system: CTAB with diminished bases, faint crackles, normal respiratory effort. On heated HFNC at 50 L/min,70% FiO2. Cardiovascular system: normal S1/S2, RRR, no pedal edema.   Central nervous system: A&O x3. no gross focal neurologic deficits, normal speech Psychiatry: normal mood, congruent affect, judgement and insight appear normal  Labs   Data Reviewed: I have personally reviewed following labs  and imaging studies  CBC: Recent Labs  Lab 06/25/20 1438 06/26/20 0534 06/27/20 0509  WBC 7.5 3.8* 8.4  NEUTROABS 6.4 3.0  --   HGB 14.7 14.2 13.9  HCT 43.2 42.6 41.3  MCV 81.7 84.2 84.8  PLT 242 222 326   Basic Metabolic Panel: Recent Labs  Lab 06/25/20 1438 06/26/20 0534 06/27/20 0509 06/28/20 0641 06/29/20 0354  NA 134* 138 138 136 134*  K 4.2 4.1 4.5 4.4  4.6  CL 100 103 105 104 103  CO2 22 22 20* 22 23  GLUCOSE 120* 147* 134* 135* 160*  BUN 20 21* 21* 25* 26*  CREATININE 0.93 0.98 0.74 0.80 0.94  CALCIUM 8.6* 8.7* 8.3* 8.5* 8.3*  MG 2.4 2.5* 2.6*  --   --   PHOS  --  4.2  --   --   --    GFR: Estimated Creatinine Clearance: 104 mL/min (by C-G formula based on SCr of 0.94 mg/dL). Liver Function Tests: Recent Labs  Lab 06/25/20 1438 06/26/20 0534 06/27/20 0509 06/28/20 0641 06/29/20 0354  AST 129* 105* 75* 46* 42*  ALT 75* 70* 72* 69* 69*  ALKPHOS 57 60 63 64 57  BILITOT 1.1 0.9 0.9 1.0 0.9  PROT 7.5 6.7 6.5 6.4* 6.1*  ALBUMIN 3.4* 3.2* 3.0* 3.0* 3.0*   Recent Labs  Lab 06/25/20 1438  LIPASE 29   No results for input(s): AMMONIA in the last 168 hours. Coagulation Profile: No results for input(s): INR, PROTIME in the last 168 hours. Cardiac Enzymes: No results for input(s): CKTOTAL, CKMB, CKMBINDEX, TROPONINI in the last 168 hours. BNP (last 3 results) No results for input(s): PROBNP in the last 8760 hours. HbA1C: No results for input(s): HGBA1C in the last 72 hours. CBG: Recent Labs  Lab 06/26/20 2324  GLUCAP 128*   Lipid Profile: No results for input(s): CHOL, HDL, LDLCALC, TRIG, CHOLHDL, LDLDIRECT in the last 72 hours. Thyroid Function Tests: No results for input(s): TSH, T4TOTAL, FREET4, T3FREE, THYROIDAB in the last 72 hours. Anemia Panel: No results for input(s): VITAMINB12, FOLATE, FERRITIN, TIBC, IRON, RETICCTPCT in the last 72 hours. Sepsis Labs: Recent Labs  Lab 06/25/20 1438 06/26/20 0922 06/27/20 0509 06/28/20 0641    PROCALCITON  --  0.63 0.51 0.25  LATICACIDVEN 1.8  --   --   --     Recent Results (from the past 240 hour(s))  SARS CORONAVIRUS 2 (TAT 6-24 HRS) Nasopharyngeal Nasopharyngeal Swab     Status: Abnormal   Collection Time: 06/21/20 11:36 AM   Specimen: Nasopharyngeal Swab  Result Value Ref Range Status   SARS Coronavirus 2 POSITIVE (A) NEGATIVE Final    Comment: Emailed Latrelle Dodrill @ 2131 on 06/21/2020 by Braxton Feathers (NOTE) SARS-CoV-2 target nucleic acids are DETECTED.  The SARS-CoV-2 RNA is generally detectable in upper and lower respiratory specimens during the acute phase of infection. Positive results are indicative of the presence of SARS-CoV-2 RNA. Clinical correlation with patient history and other diagnostic information is  necessary to determine patient infection status. Positive results do not rule out bacterial infection or co-infection with other viruses.  The expected result is Negative.  Fact Sheet for Patients: SugarRoll.be  Fact Sheet for Healthcare Providers: https://www.woods-mathews.com/  This test is not yet approved or cleared by the [place] FDA and  has been authorized for detection and/or diagnosis of SARS-CoV-2 by FDA under an Emergency Use Authorization (EUA). This EUA will remain  in effect (meaning this test can be used) for the duration of the COV ID-19 declaration under Section 564(b)(1) of the Act, 21 U.S.C. section 360bbb-3(b)(1), unless the authorization is terminated or revoked sooner.   Performed at [facility], [place] [address], [place], [place] [address]   Rapid Influenza A&B Antigens     Status: None   Collection Time: 06/21/20 11:36 AM   Specimen: Flu Kit Nasopharyngeal Swab; Respiratory  Result Value Ref Range Status   Influenza A [facility]) NEGATIVE NEGATIVE Final  Influenza B ([place]) NEGATIVE NEGATIVE Final    Comment: Negative results do not exclude influenza virus infection, and  influenza should still be considered if clinical suspicion is high. Performed at [facility], [address], [place], [place] [address]       Imaging Studies   CT ANGIO CHEST PE W OR WO CONTRAST  Result Date: 06/28/2020 CLINICAL DATA:  COVID-19 positive, high probability of pulmonary embolus. EXAM: CT ANGIOGRAPHY CHEST WITH CONTRAST TECHNIQUE: Multidetector CT imaging of the chest was performed using the standard protocol during bolus administration of intravenous contrast. Multiplanar CT image reconstructions and MIPs were obtained to evaluate the vascular anatomy. CONTRAST:  52m OMNIPAQUE IOHEXOL 350 MG/ML SOLN COMPARISON:  None. FINDINGS: Cardiovascular: Satisfactory opacification of the pulmonary arteries to the segmental level. No evidence of pulmonary embolism. Normal heart size. No pericardial effusion. Mediastinum/Nodes: No enlarged mediastinal, hilar, or axillary lymph nodes. Thyroid gland, trachea, and esophagus demonstrate no significant findings. Lungs/Pleura: No pneumothorax or pleural effusion is noted. Multiple patchy airspace opacities are noted throughout both lungs most consistent with multifocal pneumonia due to COVID-19. Upper Abdomen: Hepatic steatosis is noted. Musculoskeletal: No chest wall abnormality. No acute or significant osseous findings. Review of the MIP images confirms the above findings. IMPRESSION: 1. No definite evidence of pulmonary embolus. 2. Multiple patchy airspace opacities are noted throughout both lungs most consistent with multifocal pneumonia due to COVID-19. 3. Hepatic steatosis. Electronically Signed   By: JMarijo ConceptionM.D.   On: 06/28/2020 12:25   UKoreaVenous Img Lower Bilateral (DVT)  Result Date: 06/29/2020 CLINICAL DATA:  COVID pneumonia. EXAM: BILATERAL LOWER EXTREMITY VENOUS DOPPLER ULTRASOUND TECHNIQUE: Gray-scale sonography with graded compression, as well as color Doppler and duplex ultrasound were performed to evaluate the  lower extremity deep venous systems from the level of the common femoral vein and including the common femoral, femoral, profunda femoral, popliteal and calf veins including the posterior tibial, peroneal and gastrocnemius veins when visible. The superficial great saphenous vein was also interrogated. Spectral Doppler was utilized to evaluate flow at rest and with distal augmentation maneuvers in the common femoral, femoral and popliteal veins. COMPARISON:  None. FINDINGS: RIGHT LOWER EXTREMITY Common Femoral Vein: No evidence of thrombus. Normal compressibility, respiratory phasicity and response to augmentation. Saphenofemoral Junction: No evidence of thrombus. Normal compressibility and flow on color Doppler imaging. Profunda Femoral Vein: No evidence of thrombus. Normal compressibility and flow on color Doppler imaging. Femoral Vein: No evidence of thrombus. Normal compressibility, respiratory phasicity and response to augmentation. Popliteal Vein: No evidence of thrombus. Normal compressibility, respiratory phasicity and response to augmentation. Calf Veins: No evidence of thrombus. Normal compressibility and flow on color Doppler imaging. Superficial Great Saphenous Vein: No evidence of thrombus. Normal compressibility. Venous Reflux:  None. Other Findings: No evidence of superficial thrombophlebitis or abnormal fluid collection. LEFT LOWER EXTREMITY Common Femoral Vein: No evidence of thrombus. Normal compressibility, respiratory phasicity and response to augmentation. Saphenofemoral Junction: No evidence of thrombus. Normal compressibility and flow on color Doppler imaging. Profunda Femoral Vein: No evidence of thrombus. Normal compressibility and flow on color Doppler imaging. Femoral Vein: No evidence of thrombus. Normal compressibility, respiratory phasicity and response to augmentation. Popliteal Vein: No evidence of thrombus. Normal compressibility, respiratory phasicity and response to augmentation.  Calf Veins: No evidence of thrombus. Normal compressibility and flow on color Doppler imaging. Superficial Great Saphenous Vein: No evidence of thrombus. Normal compressibility. Venous Reflux:  None. Other Findings: No evidence of superficial thrombophlebitis or abnormal  fluid collection. IMPRESSION: No evidence of deep venous thrombosis in either lower extremity. Electronically Signed   By: Aletta Edouard M.D.   On: 06/29/2020 11:56     Medications   Scheduled Meds: . acidophilus  2 capsule Oral TID  . aspirin EC  81 mg Oral Daily  . baricitinib  4 mg Oral Daily  . dextromethorphan-guaiFENesin  1 tablet Oral BID  . doxycycline  100 mg Oral Q12H  . enoxaparin (LOVENOX) injection  0.5 mg/kg Subcutaneous Q24H  . Ipratropium-Albuterol  1 puff Inhalation Q6H  . methylPREDNISolone (SOLU-MEDROL) injection  100 mg Intravenous Q12H  . mometasone-formoterol  2 puff Inhalation BID  . pantoprazole  40 mg Oral Daily   Continuous Infusions: . cefTRIAXone (ROCEPHIN)  IV 2 g (06/29/20 0959)       LOS: 4 days    Time spent: 25 minutes with > 50% spent in coordination of care and direct patient contact.    Ezekiel Slocumb, DO [facility]  06/29/2020, 4:12 PM    If 7PM-7AM, please contact night-coverage. How to contact the [facility] Attending or Consulting provider [place] or covering provider during after hours [place], for this patient?    1. Check the care team in [facility] and look for a) attending/consulting [facility] provider listed and b) the [facility] team listed 2. Log into [email or web address] and use [place] universal password to access. If you do not have the password, please contact the hospital operator. 3. Locate the [facility] provider you are looking for under [facility] and page to a number that you can be directly reached. 4. If you still have difficulty reaching the provider, please page the [facility] (Director on Call) for the Hospitalists listed on amion for assistance.

## 2020-06-29 NOTE — Plan of Care (Signed)
  Problem: Education: Goal: Knowledge of General Education information will improve Description: Including pain rating scale, medication(s)/side effects and non-pharmacologic comfort measures 06/29/2020 1225 by Cristela Blue, RN Outcome: Progressing 06/29/2020 1225 by Cristela Blue, RN Outcome: Progressing   Problem: Health Behavior/Discharge Planning: Goal: Ability to manage health-related needs will improve 06/29/2020 1225 by Cristela Blue, RN Outcome: Progressing 06/29/2020 1225 by Cristela Blue, RN Outcome: Progressing   Problem: Clinical Measurements: Goal: Ability to maintain clinical measurements within normal limits will improve 06/29/2020 1225 by Cristela Blue, RN Outcome: Progressing 06/29/2020 1225 by Cristela Blue, RN Outcome: Progressing Goal: Will remain free from infection 06/29/2020 1225 by Cristela Blue, RN Outcome: Progressing 06/29/2020 1225 by Cristela Blue, RN Outcome: Progressing Goal: Diagnostic test results will improve 06/29/2020 1225 by Cristela Blue, RN Outcome: Progressing 06/29/2020 1225 by Cristela Blue, RN Outcome: Progressing Goal: Respiratory complications will improve 06/29/2020 1225 by Cristela Blue, RN Outcome: Progressing 06/29/2020 1225 by Cristela Blue, RN Outcome: Progressing Goal: Cardiovascular complication will be avoided 06/29/2020 1225 by Cristela Blue, RN Outcome: Progressing 06/29/2020 1225 by Cristela Blue, RN Outcome: Progressing   Problem: Activity: Goal: Risk for activity intolerance will decrease 06/29/2020 1225 by Cristela Blue, RN Outcome: Progressing 06/29/2020 1225 by Cristela Blue, RN Outcome: Progressing   Problem: Nutrition: Goal: Adequate nutrition will be maintained 06/29/2020 1225 by Cristela Blue, RN Outcome: Progressing 06/29/2020 1225 by Cristela Blue, RN Outcome: Progressing   Problem: Coping: Goal: Level of anxiety will decrease 06/29/2020 1225 by Cristela Blue, RN Outcome:  Progressing 06/29/2020 1225 by Cristela Blue, RN Outcome: Progressing   Problem: Elimination: Goal: Will not experience complications related to bowel motility 06/29/2020 1225 by Cristela Blue, RN Outcome: Progressing 06/29/2020 1225 by Cristela Blue, RN Outcome: Progressing Goal: Will not experience complications related to urinary retention 06/29/2020 1225 by Cristela Blue, RN Outcome: Progressing 06/29/2020 1225 by Cristela Blue, RN Outcome: Progressing   Problem: Pain Managment: Goal: General experience of comfort will improve 06/29/2020 1225 by Cristela Blue, RN Outcome: Progressing 06/29/2020 1225 by Cristela Blue, RN Outcome: Progressing   Problem: Safety: Goal: Ability to remain free from injury will improve 06/29/2020 1225 by Cristela Blue, RN Outcome: Progressing 06/29/2020 1225 by Cristela Blue, RN Outcome: Progressing   Problem: Skin Integrity: Goal: Risk for impaired skin integrity will decrease 06/29/2020 1225 by Cristela Blue, RN Outcome: Progressing 06/29/2020 1225 by Cristela Blue, RN Outcome: Progressing

## 2020-06-30 DIAGNOSIS — U071 COVID-19: Secondary | ICD-10-CM | POA: Diagnosis not present

## 2020-06-30 DIAGNOSIS — J9601 Acute respiratory failure with hypoxia: Secondary | ICD-10-CM | POA: Diagnosis not present

## 2020-06-30 DIAGNOSIS — J1282 Pneumonia due to coronavirus disease 2019: Secondary | ICD-10-CM | POA: Diagnosis not present

## 2020-06-30 LAB — COMPREHENSIVE METABOLIC PANEL
ALT: 81 U/L — ABNORMAL HIGH (ref 0–44)
AST: 43 U/L — ABNORMAL HIGH (ref 15–41)
Albumin: 3.1 g/dL — ABNORMAL LOW (ref 3.5–5.0)
Alkaline Phosphatase: 55 U/L (ref 38–126)
Anion gap: 7 (ref 5–15)
BUN: 25 mg/dL — ABNORMAL HIGH (ref 6–20)
CO2: 24 mmol/L (ref 22–32)
Calcium: 8.4 mg/dL — ABNORMAL LOW (ref 8.9–10.3)
Chloride: 103 mmol/L (ref 98–111)
Creatinine, Ser: 0.88 mg/dL (ref 0.61–1.24)
GFR, Estimated: >60 mL/min (ref >60)
Glucose, Bld: 148 mg/dL — ABNORMAL HIGH (ref 70–99)
Potassium: 4.7 mmol/L (ref 3.5–5.1)
Sodium: 134 mmol/L — ABNORMAL LOW (ref 135–145)
Total Bilirubin: 1 mg/dL (ref 0.3–1.2)
Total Protein: 6.1 g/dL — ABNORMAL LOW (ref 6.5–8.1)

## 2020-06-30 LAB — C-REACTIVE PROTEIN: CRP: 0.6 mg/dL (ref <1.0)

## 2020-06-30 LAB — FIBRIN DERIVATIVES D-DIMER (ARMC ONLY): Fibrin derivatives D-dimer (ARMC): 3263.67 ng/mL (FEU) — ABNORMAL HIGH (ref 0.00–499.00)

## 2020-06-30 MED ORDER — HYDROCOD POLST-CPM POLST ER 10-8 MG/5ML PO SUER
5.0000 mL | Freq: Two times a day (BID) | ORAL | Status: DC | PRN
  Administered 2020-07-01 – 2020-07-13 (×10): 5 mL via ORAL
  Filled 2020-06-30 (×10): qty 5

## 2020-06-30 MED ORDER — PNEUMOCOCCAL VAC POLYVALENT 25 MCG/0.5ML IJ INJ: 0.5000 mL | INJECTION | INTRAMUSCULAR | Status: DC

## 2020-06-30 MED ORDER — INFLUENZA VAC SPLIT QUAD 0.5 ML IM SUSY: 0.5000 mL | PREFILLED_SYRINGE | INTRAMUSCULAR | Status: DC

## 2020-06-30 NOTE — Plan of Care (Signed)

## 2020-06-30 NOTE — Progress Notes (Signed)
PROGRESS NOTE    RALLY OUCH   WPV:[id number]  DOB: [birth year]  PCP: Olin Hauser, DO    DOA: 06/25/2020 LOS: 5   Brief Narrative   Derek Cook is a 59 y.o. male with medical history significant for COPD, mild persistent asthma, GERD, who presented to the ED on 06/25/2020 with 1 week history of progressive shortness of breath, nonproductive cough, increased fatigue.  Recent travel to [place], no known sick Covid contacts, but is unvaccinated.  Admitted for acute respiratory failure with hypoxia due to severe Covid-19 pneumonia.     Assessment & Plan   Principal Problem:   Pneumonia due to COVID-19 virus Active Problems:   Hyperlipidemia   GERD (gastroesophageal reflux disease)   Acute respiratory failure with hypoxia (HCC)   SOB (shortness of breath)  Acute respiratory failure with hypoxia due to Severe COVID-19 pneumonia - tested positive as outpatient on 06/11/20.  Presented with 1 week worsening SOB, cough, hypoxia and confirmed Covid-19 positive by PCR.   CRP trend: 17.3 >> 8.4 >> 3.2 >> 1.3 >> 0.6 normal on 10/24. 10/21: significant worsening overnight, now on heated HFNC at 35 L/min.   10/22: oxygen requirement increased to 55 L/min since yesterday.  CTA chest obtained negative for PE, showed multifocal PNA. 10/23: slight reduction in O2 need to 50 L/min, feeling better, LE duplex negative for DVT b/l. 10/24: pt feeling better, O2 requirement 50-60 L/min  --Continue remdesevir (10/19 >> ) --Continue IV Solu-medrol 100 mg BID (10/19 >> ) (initially on Decadron) --Started on baricitinib (10/21 >> ) --Tylenol PRN fever, mild pain --Antitussives, Combivent q6h, Dulera BID --Follow inflammatory markers, CMP, CBC daily --Supplemental O2 as needed, maintain spO2>90%, wean as tolerated   Superimposed Community-acquired Pneumonia - elevated procalcitonin, worsening respiratory status overnight following admission.   --Continue Rocephin and doxycycline for CAP  coverage (allergy to azithromycin), to complete 5 day course.  Hyperlipidemia - not on statin, managed by lifestyle.  PCP follow up.  GERD - on PPI  Obesity: Body mass index is 34.97 kg/m.  Complicates overall care and prognosis.   DVT prophylaxis: Lovenox   Diet:  Diet Orders (From admission, onward)    Start     Ordered   06/25/20 1834  Diet regular Room service appropriate? Yes; Fluid consistency: Thin  Diet effective now       Question Answer Comment  Room service appropriate? Yes   Fluid consistency: Thin      06/25/20 1835            Code Status: Full Code    Subjective 06/30/20    Pt seen at bedside this AM.  States he is feeling quite a lot better today.  Yesterday, stated he was praying, and sang a hymn and afterward his O2 sat went up to 100%.  Expresses worry about his wife at home, also just diagnosed with Covid.  Denies fever/chills, chest pain, SOB at rest or other acute complaints.   Disposition Plan & Communication   Status is: Inpatient  Remains inpatient appropriate because:IV treatments appropriate due to intensity of illness or inability to take PO.  Patient has worsening respiratory status and remains with high oxygen requirements on heated high flow.   Dispo: The patient is from: Home              Anticipated d/c is to: Home              Anticipated d/c date is: > 3 days  Patient currently is not medically stable to d/c.    Family Communication: spoke to patient's wife, [name], by phone afternoon on 10/24.  She tested positive for Covid as well, found out result yesterday.      Brother Jaycion Treml (wife [name]) [phone].   Pt gives permission to update them any time.  I spoke with them on 10/23.   Consults, Procedures, Significant Events   Consultants:   None  Procedures:   None  Antimicrobials:  Anti-infectives (From admission, onward)   Start     Dose/Rate Route Frequency Ordered Stop   06/28/20 1000  cefTRIAXone  (ROCEPHIN) 2 g in sodium chloride 0.9 % 100 mL IVPB        2 g 200 mL/hr over 30 Minutes Intravenous Every 24 hours 06/27/20 1600 07/02/20 0959   06/27/20 1000  cefTRIAXone (ROCEPHIN) 1 g in sodium chloride 0.9 % 100 mL IVPB  Status:  Discontinued        1 g 200 mL/hr over 30 Minutes Intravenous Every 24 hours 06/27/20 0854 06/27/20 1600   06/27/20 1000  doxycycline (VIBRAMYCIN) 100 mg in sodium chloride 0.9 % 250 mL IVPB  Status:  Discontinued        100 mg 125 mL/hr over 120 Minutes Intravenous Every 12 hours 06/27/20 0855 06/27/20 0937   06/27/20 1000  doxycycline (VIBRA-TABS) tablet 100 mg        100 mg Oral Every 12 hours 06/27/20 0937 07/02/20 0959   06/26/20 1000  remdesivir 100 mg in sodium chloride 0.9 % 100 mL IVPB       "Followed by" Linked Group Details   100 mg 200 mL/hr over 30 Minutes Intravenous Daily 06/25/20 1603 06/29/20 1020   06/25/20 1700  remdesivir 200 mg in sodium chloride 0.9% 250 mL IVPB       "Followed by" Linked Group Details   200 mg 580 mL/hr over 30 Minutes Intravenous Once 06/25/20 1603 06/25/20 1815        Objective   Vitals:   06/30/20 0858 06/30/20 1200 06/30/20 1242 06/30/20 1558  BP: 122/71 100/64 108/76 115/63  Pulse: 70 62  78  Resp: (!) 23 (!) 25 (!) 22 (!) 21  Temp: 97.7 F (36.5 C) 97.9 F (36.6 C) 98.1 F (36.7 C) 98.6 F (37 C)  TempSrc: Oral   Oral  SpO2: 95% 94%  95%  Weight:      Height:        Intake/Output Summary (Last 24 hours) at 06/30/2020 1634 Last data filed at 06/30/2020 0900 Gross per 24 hour  Intake 0 ml  Output 1100 ml  Net -1100 ml   Filed Weights   06/27/20 1550 06/29/20 0308 06/30/20 0500  Weight: 102.6 kg 107 kg 104.3 kg    Physical Exam:  General exam: awake, alert, no acute distress Respiratory system: CTAB with diminished bases, faint crackles, normal respiratory effort. On heated HFNC at 50 L/min,70% FiO2. Cardiovascular system: normal S1/S2, RRR, no pedal edema.   Central nervous system: A&O  x3. no gross focal neurologic deficits, normal speech Psychiatry: normal mood, congruent affect, judgement and insight appear normal  Labs   Data Reviewed: I have personally reviewed following labs and imaging studies  CBC: Recent Labs  Lab 06/25/20 1438 06/26/20 0534 06/27/20 0509  WBC 7.5 3.8* 8.4  NEUTROABS 6.4 3.0  --   HGB 14.7 14.2 13.9  HCT 43.2 42.6 41.3  MCV 81.7 84.2 84.8  PLT 242 222 680   Basic Metabolic  Panel: Recent Labs  Lab 06/25/20 1438 06/25/20 1438 06/26/20 0534 06/27/20 0509 06/28/20 0641 06/29/20 0354 06/30/20 0447  NA 134*   < > 138 138 136 134* 134*  K 4.2   < > 4.1 4.5 4.4 4.6 4.7  CL 100   < > 103 105 104 103 103  CO2 22   < > 22 20* 22 23 24   GLUCOSE 120*   < > 147* 134* 135* 160* 148*  BUN 20   < > 21* 21* 25* 26* 25*  CREATININE 0.93   < > 0.98 0.74 0.80 0.94 0.88  CALCIUM 8.6*   < > 8.7* 8.3* 8.5* 8.3* 8.4*  MG 2.4  --  2.5* 2.6*  --   --   --   PHOS  --   --  4.2  --   --   --   --    < > = values in this interval not displayed.   GFR: Estimated Creatinine Clearance: 109.8 mL/min (by C-G formula based on SCr of 0.88 mg/dL). Liver Function Tests: Recent Labs  Lab 06/26/20 0534 06/27/20 0509 06/28/20 0641 06/29/20 0354 06/30/20 0447  AST 105* 75* 46* 42* 43*  ALT 70* 72* 69* 69* 81*  ALKPHOS 60 63 64 57 55  BILITOT 0.9 0.9 1.0 0.9 1.0  PROT 6.7 6.5 6.4* 6.1* 6.1*  ALBUMIN 3.2* 3.0* 3.0* 3.0* 3.1*   Recent Labs  Lab 06/25/20 1438  LIPASE 29   No results for input(s): AMMONIA in the last 168 hours. Coagulation Profile: No results for input(s): INR, PROTIME in the last 168 hours. Cardiac Enzymes: No results for input(s): CKTOTAL, CKMB, CKMBINDEX, TROPONINI in the last 168 hours. BNP (last 3 results) No results for input(s): PROBNP in the last 8760 hours. HbA1C: No results for input(s): HGBA1C in the last 72 hours. CBG: Recent Labs  Lab 06/26/20 2324  GLUCAP 128*   Lipid Profile: No results for input(s): CHOL, HDL,  LDLCALC, TRIG, CHOLHDL, LDLDIRECT in the last 72 hours. Thyroid Function Tests: No results for input(s): TSH, T4TOTAL, FREET4, T3FREE, THYROIDAB in the last 72 hours. Anemia Panel: No results for input(s): VITAMINB12, FOLATE, FERRITIN, TIBC, IRON, RETICCTPCT in the last 72 hours. Sepsis Labs: Recent Labs  Lab 06/25/20 1438 06/26/20 0922 06/27/20 0509 06/28/20 0641  PROCALCITON  --  0.63 0.51 0.25  LATICACIDVEN 1.8  --   --   --     Recent Results (from the past 240 hour(s))  SARS CORONAVIRUS 2 (TAT 6-24 HRS) Nasopharyngeal Nasopharyngeal Swab     Status: Abnormal   Collection Time: 06/21/20 11:36 AM   Specimen: Nasopharyngeal Swab  Result Value Ref Range Status   SARS Coronavirus 2 POSITIVE (A) NEGATIVE Final    Comment: Emailed Latrelle Dodrill @ 2131 on 06/21/2020 by Braxton Feathers (NOTE) SARS-CoV-2 target nucleic acids are DETECTED.  The SARS-CoV-2 RNA is generally detectable in upper and lower respiratory specimens during the acute phase of infection. Positive results are indicative of the presence of SARS-CoV-2 RNA. Clinical correlation with patient history and other diagnostic information is  necessary to determine patient infection status. Positive results do not rule out bacterial infection or co-infection with other viruses.  The expected result is Negative.  Fact Sheet for Patients: SugarRoll.be  Fact Sheet for Healthcare Providers: https://www.woods-mathews.com/  This test is not yet approved or cleared by the [place] FDA and  has been authorized for detection and/or diagnosis of SARS-CoV-2 by FDA under an Emergency Use Authorization (EUA). This  EUA will remain  in effect (meaning this test can be used) for the duration of the COV ID-19 declaration under Section 564(b)(1) of the Act, 21 U.S.C. section 360bbb-3(b)(1), unless the authorization is terminated or revoked sooner.   Performed at [facility], [place] [address] [place], [place] [address]   Rapid Influenza A&B Antigens     Status: None   Collection Time: 06/21/20 11:36 AM   Specimen: Flu Kit Nasopharyngeal Swab; Respiratory  Result Value Ref Range Status   Influenza A ([facility]) NEGATIVE NEGATIVE Final   Influenza B ([facility]) NEGATIVE NEGATIVE Final    Comment: Negative results do not exclude influenza virus infection, and influenza should still be considered if clinical suspicion is high. Performed at [facility], [address] [place],  [address]       Imaging Studies   US Venous Img Lower Bilateral (DVT)  Result Date: 06/29/2020 CLINICAL DATA:  COVID pneumonia. EXAM: BILATERAL LOWER EXTREMITY VENOUS DOPPLER ULTRASOUND TECHNIQUE: Gray-scale sonography with graded compression, as well as color Doppler and duplex ultrasound were performed to evaluate the lower extremity deep venous systems from the level of the common femoral vein and including the common femoral, femoral, profunda femoral, popliteal and calf veins including the posterior tibial, peroneal and gastrocnemius veins when visible. The superficial great saphenous vein was also interrogated. Spectral Doppler was utilized to evaluate flow at rest and with distal augmentation maneuvers in the common femoral, femoral and popliteal veins. COMPARISON:  None. FINDINGS: RIGHT LOWER EXTREMITY Common Femoral Vein: No evidence of thrombus. Normal compressibility, respiratory phasicity and response to augmentation. Saphenofemoral Junction: No evidence of thrombus. Normal compressibility and flow on color Doppler imaging. Profunda Femoral Vein: No evidence of thrombus. Normal compressibility and flow on color Doppler imaging. Femoral Vein: No evidence of thrombus. Normal compressibility, respiratory phasicity and response to augmentation. Popliteal Vein: No evidence of thrombus. Normal compressibility, respiratory phasicity and response to augmentation. Calf Veins: No evidence of  thrombus. Normal compressibility and flow on color Doppler imaging. Superficial Great Saphenous Vein: No evidence of thrombus. Normal compressibility. Venous Reflux:  None. Other Findings: No evidence of superficial thrombophlebitis or abnormal fluid collection. LEFT LOWER EXTREMITY Common Femoral Vein: No evidence of thrombus. Normal compressibility, respiratory phasicity and response to augmentation. Saphenofemoral Junction: No evidence of thrombus. Normal compressibility and flow on color Doppler imaging. Profunda Femoral Vein: No evidence of thrombus. Normal compressibility and flow on color Doppler imaging. Femoral Vein: No evidence of thrombus. Normal compressibility, respiratory phasicity and response to augmentation. Popliteal Vein: No evidence of thrombus. Normal compressibility, respiratory phasicity and response to augmentation. Calf Veins: No evidence of thrombus. Normal compressibility and flow on color Doppler imaging. Superficial Great Saphenous Vein: No evidence of thrombus. Normal compressibility. Venous Reflux:  None. Other Findings: No evidence of superficial thrombophlebitis or abnormal fluid collection. IMPRESSION: No evidence of deep venous thrombosis in either lower extremity. Electronically Signed   By: Aletta Edouard M.D.   On: 06/29/2020 11:56     Medications   Scheduled Meds:  acidophilus  2 capsule Oral TID   aspirin EC  81 mg Oral Daily   baricitinib  4 mg Oral Daily   dextromethorphan-guaiFENesin  1 tablet Oral BID   doxycycline  100 mg Oral Q12H   enoxaparin (LOVENOX) injection  0.5 mg/kg Subcutaneous Q24H   [START ON 07/01/2020] influenza vac split quadrivalent PF  0.5 mL Intramuscular Tomorrow-1000   Ipratropium-Albuterol  1 puff Inhalation Q6H   methylPREDNISolone (SOLU-MEDROL) injection  100 mg Intravenous Q12H   mometasone-formoterol  2 puff Inhalation BID   pantoprazole  40 mg Oral Daily   [START ON 07/01/2020] pneumococcal 23 valent vaccine  0.5 mL  Intramuscular Tomorrow-1000   Continuous Infusions:  cefTRIAXone (ROCEPHIN)  IV 2 g (06/30/20 0904)       LOS: 5 days    Time spent: 25 minutes with > 50% spent in coordination of care and direct patient contact.    Ezekiel Slocumb, DO [facility]  06/30/2020, 4:34 PM    If 7PM-7AM, please contact night-coverage. How to contact the [facility] Attending or Consulting provider [place] or covering provider during after hours [place], for this patient?    1. Check the care team in [facility] and look for a) attending/consulting [facility] provider listed and b) the [facility] team listed 2. Log into [email or web address] and use [place]'s universal password to access. If you do not have the password, please contact the hospital operator. 3. Locate the [facility] provider you are looking for under [facility] and page to a number that you can be directly reached. 4. If you still have difficulty reaching the provider, please page the [facility] (Director on Call) for the Hospitalists listed on amion for assistance.

## 2020-07-01 DIAGNOSIS — J1282 Pneumonia due to coronavirus disease 2019: Secondary | ICD-10-CM | POA: Diagnosis not present

## 2020-07-01 DIAGNOSIS — U071 COVID-19: Secondary | ICD-10-CM | POA: Diagnosis not present

## 2020-07-01 DIAGNOSIS — J9601 Acute respiratory failure with hypoxia: Secondary | ICD-10-CM | POA: Diagnosis not present

## 2020-07-01 LAB — COMPREHENSIVE METABOLIC PANEL
ALT: 85 U/L — ABNORMAL HIGH (ref 0–44)
AST: 37 U/L (ref 15–41)
Albumin: 3.2 g/dL — ABNORMAL LOW (ref 3.5–5.0)
Alkaline Phosphatase: 55 U/L (ref 38–126)
Anion gap: 9 (ref 5–15)
BUN: 26 mg/dL — ABNORMAL HIGH (ref 6–20)
CO2: 23 mmol/L (ref 22–32)
Calcium: 8.8 mg/dL — ABNORMAL LOW (ref 8.9–10.3)
Chloride: 100 mmol/L (ref 98–111)
Creatinine, Ser: 1.07 mg/dL (ref 0.61–1.24)
GFR, Estimated: >60 mL/min (ref >60)
Glucose, Bld: 126 mg/dL — ABNORMAL HIGH (ref 70–99)
Potassium: 4.4 mmol/L (ref 3.5–5.1)
Sodium: 132 mmol/L — ABNORMAL LOW (ref 135–145)
Total Bilirubin: 1 mg/dL (ref 0.3–1.2)
Total Protein: 6.4 g/dL — ABNORMAL LOW (ref 6.5–8.1)

## 2020-07-01 LAB — C-REACTIVE PROTEIN: CRP: 0.5 mg/dL (ref <1.0)

## 2020-07-01 LAB — FIBRIN DERIVATIVES D-DIMER (ARMC ONLY): Fibrin derivatives D-dimer (ARMC): 2335.87 ng/mL (FEU) — ABNORMAL HIGH (ref 0.00–499.00)

## 2020-07-01 NOTE — Progress Notes (Signed)
PROGRESS NOTE    [name]   RJJ:[id number]  DOB: October 31, 1960  PCP: Olin Hauser, DO    DOA: 06/25/2020 LOS: 6   Brief Narrative   Derek Cook is a 59 y.o. male with medical history significant for COPD, mild persistent asthma, GERD, who presented to the ED on 06/25/2020 with 1 week history of progressive shortness of breath, nonproductive cough, increased fatigue.  Recent travel to [place], no known sick Covid contacts, but is unvaccinated.  Admitted for acute respiratory failure with hypoxia due to severe Covid-19 pneumonia.     Assessment & Plan   Principal Problem:   Pneumonia due to COVID-19 virus Active Problems:   Hyperlipidemia   GERD (gastroesophageal reflux disease)   Acute respiratory failure with hypoxia (HCC)   SOB (shortness of breath)  Acute respiratory failure with hypoxia due to Severe COVID-19 pneumonia - tested positive as outpatient on 06/11/20.  Presented with 1 week worsening SOB, cough, hypoxia and confirmed Covid-19 positive by PCR.   CRP trend: 17.3 >> 8.4 >> 3.2 >> 1.3 >> 0.6 normal on 10/24. 10/21: significant worsening overnight, now on heated HFNC at 35 L/min.   10/22: oxygen requirement increased to 55 L/min since yesterday.  CTA chest obtained negative for PE, showed multifocal PNA. 10/23: slight reduction in O2 need to 50 L/min, feeling better, LE duplex negative for DVT b/l. 10/24: pt feeling better, O2 requirement 50-60 L/min  10/25: still on heated HFNC, at 45 L/min 55% fio2 this AM --Completed remdesevir 10/19 >> 10/23 --Continue IV Solu-medrol 100 mg BID until further improved O2 requirement (10/19 >> ) --Started on baricitinib x14 days or d/c (10/21 >> ) --Tylenol PRN fever, mild pain --Antitussives, Combivent q6h, Dulera BID --Follow inflammatory markers, CMP, CBC daily --Supplemental O2 as needed, maintain spO2>90%, wean as tolerated   Superimposed Community-acquired Pneumonia - elevated procalcitonin, worsening  respiratory status overnight following admission.   --Continue Rocephin and doxycycline for CAP coverage (allergy to azithromycin), to complete 5 day course.  Hyperlipidemia - not on statin, managed by lifestyle.  PCP follow up.  GERD - on PPI  Obesity: Body mass index is 34.96 kg/m.  Complicates overall care and prognosis.   DVT prophylaxis: Lovenox   Diet:  Diet Orders (From admission, onward)    Start     Ordered   06/25/20 1834  Diet regular Room service appropriate? Yes; Fluid consistency: Thin  Diet effective now       Question Answer Comment  Room service appropriate? Yes   Fluid consistency: Thin      06/25/20 1835            Code Status: Full Code    Subjective 07/01/20    Pt seen at bedside this AM.  Reports he feels good today.  Oxygen need down slightly, currently on 45 L/min with spO2 in mid 80's with talking during our encounter this morning.  Denies any acute complaints.  Requesting cough syrup.     Disposition Plan & Communication   Status is: Inpatient  Remains inpatient appropriate because:IV treatments appropriate due to intensity of illness or inability to take PO.  Patient has worsening respiratory status and remains with high oxygen requirements on heated high flow.   Dispo: The patient is from: Home              Anticipated d/c is to: Home              Anticipated d/c date is: > 3  days              Patient currently is not medically stable to d/c.    Family Communication: spoke to patient's wife, Derek Cook, by phone afternoon on 10/25.  She also has Covid, currently in the ER and getting admitted.      Brother Clearance Chenault (wife [name]) [phone].   Pt gives permission to update them any time.  I spoke with them on 10/23.   Consults, Procedures, Significant Events   Consultants:   None  Procedures:   None  Antimicrobials:  Anti-infectives (From admission, onward)   Start     Dose/Rate Route Frequency Ordered Stop   06/28/20 1000   cefTRIAXone (ROCEPHIN) 2 g in sodium chloride 0.9 % 100 mL IVPB        2 g 200 mL/hr over 30 Minutes Intravenous Every 24 hours 06/27/20 1600 07/01/20 1232   06/27/20 1000  cefTRIAXone (ROCEPHIN) 1 g in sodium chloride 0.9 % 100 mL IVPB  Status:  Discontinued        1 g 200 mL/hr over 30 Minutes Intravenous Every 24 hours 06/27/20 0854 06/27/20 1600   06/27/20 1000  doxycycline (VIBRAMYCIN) 100 mg in sodium chloride 0.9 % 250 mL IVPB  Status:  Discontinued        100 mg 125 mL/hr over 120 Minutes Intravenous Every 12 hours 06/27/20 0855 06/27/20 0937   06/27/20 1000  doxycycline (VIBRA-TABS) tablet 100 mg        100 mg Oral Every 12 hours 06/27/20 0937 07/02/20 0959   06/26/20 1000  remdesivir 100 mg in sodium chloride 0.9 % 100 mL IVPB       "Followed by" Linked Group Details   100 mg 200 mL/hr over 30 Minutes Intravenous Daily 06/25/20 1603 06/29/20 1020   06/25/20 1700  remdesivir 200 mg in sodium chloride 0.9% 250 mL IVPB       "Followed by" Linked Group Details   200 mg 580 mL/hr over 30 Minutes Intravenous Once 06/25/20 1603 06/25/20 1815        Objective   Vitals:   07/01/20 0500 07/01/20 0551 07/01/20 1157 07/01/20 1200  BP: 113/67   135/81  Pulse: 73   86  Resp: (!) 22   (!) 22  Temp:    98.2 F (36.8 C)  TempSrc:    Axillary  SpO2: 92%  93% 92%  Weight:  104.3 kg    Height:        Intake/Output Summary (Last 24 hours) at 07/01/2020 1641 Last data filed at 07/01/2020 0552 Gross per 24 hour  Intake 240 ml  Output 650 ml  Net -410 ml   Filed Weights   06/29/20 0308 06/30/20 0500 07/01/20 0551  Weight: 107 kg 104.3 kg 104.3 kg    Physical Exam:  General exam: awake, alert, no acute distress Respiratory system: CTAB with somewhat improved basilar aeration today, normal respiratory effort but with mild conversational dyspnea. On heated HFNC at 45 L/min,55% FiO2. Cardiovascular system: normal S1/S2, RRR, no pedal edema.   Central nervous system: A&O x3. no  gross focal neurologic deficits, normal speech Psychiatry: normal mood, congruent affect, judgement and insight appear normal  Labs   Data Reviewed: I have personally reviewed following labs and imaging studies  CBC: Recent Labs  Lab 06/25/20 1438 06/26/20 0534 06/27/20 0509  WBC 7.5 3.8* 8.4  NEUTROABS 6.4 3.0  --   HGB 14.7 14.2 13.9  HCT 43.2 42.6 41.3  MCV 81.7 84.2  84.8  PLT 242 222 161   Basic Metabolic Panel: Recent Labs  Lab 06/25/20 1438 06/25/20 1438 06/26/20 0534 06/26/20 0534 06/27/20 0509 06/28/20 0641 06/29/20 0354 06/30/20 0447 07/01/20 0707  NA 134*   < > 138   < > 138 136 134* 134* 132*  K 4.2   < > 4.1   < > 4.5 4.4 4.6 4.7 4.4  CL 100   < > 103   < > 105 104 103 103 100  CO2 22   < > 22   < > 20* 22 23 24 23   GLUCOSE 120*   < > 147*   < > 134* 135* 160* 148* 126*  BUN 20   < > 21*   < > 21* 25* 26* 25* 26*  CREATININE 0.93   < > 0.98   < > 0.74 0.80 0.94 0.88 1.07  CALCIUM 8.6*   < > 8.7*   < > 8.3* 8.5* 8.3* 8.4* 8.8*  MG 2.4  --  2.5*  --  2.6*  --   --   --   --   PHOS  --   --  4.2  --   --   --   --   --   --    < > = values in this interval not displayed.   GFR: Estimated Creatinine Clearance: 90.3 mL/min (by C-G formula based on SCr of 1.07 mg/dL). Liver Function Tests: Recent Labs  Lab 06/27/20 0509 06/28/20 0641 06/29/20 0354 06/30/20 0447 07/01/20 0707  AST 75* 46* 42* 43* 37  ALT 72* 69* 69* 81* 85*  ALKPHOS 63 64 57 55 55  BILITOT 0.9 1.0 0.9 1.0 1.0  PROT 6.5 6.4* 6.1* 6.1* 6.4*  ALBUMIN 3.0* 3.0* 3.0* 3.1* 3.2*   Recent Labs  Lab 06/25/20 1438  LIPASE 29   No results for input(s): AMMONIA in the last 168 hours. Coagulation Profile: No results for input(s): INR, PROTIME in the last 168 hours. Cardiac Enzymes: No results for input(s): CKTOTAL, CKMB, CKMBINDEX, TROPONINI in the last 168 hours. BNP (last 3 results) No results for input(s): PROBNP in the last 8760 hours. HbA1C: No results for input(s): HGBA1C in the  last 72 hours. CBG: Recent Labs  Lab 06/26/20 2324  GLUCAP 128*   Lipid Profile: No results for input(s): CHOL, HDL, LDLCALC, TRIG, CHOLHDL, LDLDIRECT in the last 72 hours. Thyroid Function Tests: No results for input(s): TSH, T4TOTAL, FREET4, T3FREE, THYROIDAB in the last 72 hours. Anemia Panel: No results for input(s): VITAMINB12, FOLATE, FERRITIN, TIBC, IRON, RETICCTPCT in the last 72 hours. Sepsis Labs: Recent Labs  Lab 06/25/20 1438 06/26/20 0922 06/27/20 0509 06/28/20 0641  PROCALCITON  --  0.63 0.51 0.25  LATICACIDVEN 1.8  --   --   --     No results found for this or any previous visit (from the past 240 hour(s)).    Imaging Studies   No results found.   Medications   Scheduled Meds: . acidophilus  2 capsule Oral TID  . aspirin EC  81 mg Oral Daily  . baricitinib  4 mg Oral Daily  . dextromethorphan-guaiFENesin  1 tablet Oral BID  . doxycycline  100 mg Oral Q12H  . enoxaparin (LOVENOX) injection  0.5 mg/kg Subcutaneous Q24H  . influenza vac split quadrivalent PF  0.5 mL Intramuscular Tomorrow-1000  . Ipratropium-Albuterol  1 puff Inhalation Q6H  . methylPREDNISolone (SOLU-MEDROL) injection  100 mg Intravenous Q12H  . mometasone-formoterol  2 puff  Inhalation BID  . pantoprazole  40 mg Oral Daily  . pneumococcal 23 valent vaccine  0.5 mL Intramuscular Tomorrow-1000   Continuous Infusions:      LOS: 6 days    Time spent: 25 minutes with > 50% spent in coordination of care and direct patient contact.    Ezekiel Slocumb, DO [facility]  07/01/2020, 4:41 PM    If 7PM-7AM, please contact night-coverage. How to contact the [facility] Attending or Consulting provider [place] or covering provider during after hours [place], for this patient?    1. Check the care team in [facility] and look for a) attending/consulting TRH provider listed and b) the [facility] team listed 2. Log into [email or web address] and use [place]'s universal password to access. If you do not  have the password, please contact the hospital operator. 3. Locate the [facility] provider you are looking for under [facility] and page to a number that you can be directly reached. 4. If you still have difficulty reaching the provider, please page the [facility] (Director on Call) for the Hospitalists listed on amion for assistance.

## 2020-07-01 NOTE — Plan of Care (Signed)

## 2020-07-02 DIAGNOSIS — J1282 Pneumonia due to coronavirus disease 2019: Secondary | ICD-10-CM | POA: Diagnosis not present

## 2020-07-02 DIAGNOSIS — U071 COVID-19: Secondary | ICD-10-CM | POA: Diagnosis not present

## 2020-07-02 DIAGNOSIS — J9601 Acute respiratory failure with hypoxia: Secondary | ICD-10-CM | POA: Diagnosis not present

## 2020-07-02 LAB — COMPREHENSIVE METABOLIC PANEL
ALT: 92 U/L — ABNORMAL HIGH (ref 0–44)
AST: 41 U/L (ref 15–41)
Albumin: 3.1 g/dL — ABNORMAL LOW (ref 3.5–5.0)
Alkaline Phosphatase: 62 U/L (ref 38–126)
Anion gap: 9 (ref 5–15)
BUN: 24 mg/dL — ABNORMAL HIGH (ref 6–20)
CO2: 21 mmol/L — ABNORMAL LOW (ref 22–32)
Calcium: 8.5 mg/dL — ABNORMAL LOW (ref 8.9–10.3)
Chloride: 103 mmol/L (ref 98–111)
Creatinine, Ser: 0.9 mg/dL (ref 0.61–1.24)
GFR, Estimated: >60 mL/min (ref >60)
Glucose, Bld: 143 mg/dL — ABNORMAL HIGH (ref 70–99)
Potassium: 4.5 mmol/L (ref 3.5–5.1)
Sodium: 133 mmol/L — ABNORMAL LOW (ref 135–145)
Total Bilirubin: 1 mg/dL (ref 0.3–1.2)
Total Protein: 6.2 g/dL — ABNORMAL LOW (ref 6.5–8.1)

## 2020-07-02 LAB — CBC
HCT: 44.3 % (ref 39.0–52.0)
Hemoglobin: 14.9 g/dL (ref 13.0–17.0)
MCH: 28.2 pg (ref 26.0–34.0)
MCHC: 33.6 g/dL (ref 30.0–36.0)
MCV: 83.7 fL (ref 80.0–100.0)
Platelets: 590 10*3/uL — ABNORMAL HIGH (ref 150–400)
RBC: 5.29 MIL/uL (ref 4.22–5.81)
RDW: 14.2 % (ref 11.5–15.5)
WBC: 14.3 10*3/uL — ABNORMAL HIGH (ref 4.0–10.5)
nRBC: 0 % (ref 0.0–0.2)

## 2020-07-02 LAB — FIBRIN DERIVATIVES D-DIMER (ARMC ONLY): Fibrin derivatives D-dimer (ARMC): 1997.3 ng/mL (FEU) — ABNORMAL HIGH (ref 0.00–499.00)

## 2020-07-02 LAB — C-REACTIVE PROTEIN: CRP: 1.7 mg/dL — ABNORMAL HIGH (ref <1.0)

## 2020-07-02 NOTE — Plan of Care (Signed)
  Problem: Clinical Measurements: Goal: Will remain free from infection Outcome: Progressing   Problem: Activity: Goal: Risk for activity intolerance will decrease Outcome: Progressing   

## 2020-07-02 NOTE — Plan of Care (Signed)

## 2020-07-02 NOTE — Progress Notes (Signed)
PROGRESS NOTE    Derek Cook   HQI:[id number]  DOB: Dec 30, 1960  PCP: Olin Hauser, DO    DOA: 06/25/2020 LOS: 7   Brief Narrative   Derek Cook is a 59 y.o. male with medical history significant for COPD, mild persistent asthma, GERD, who presented to the ED on 06/25/2020 with 1 week history of progressive shortness of breath, nonproductive cough, increased fatigue.  Recent travel to [place], no known sick Covid contacts, but is unvaccinated.  Admitted for acute respiratory failure with hypoxia due to severe Covid-19 pneumonia.     Assessment & Plan   Principal Problem:   Pneumonia due to COVID-19 virus Active Problems:   Hyperlipidemia   GERD (gastroesophageal reflux disease)   Acute respiratory failure with hypoxia (HCC)   SOB (shortness of breath)   Acute respiratory failure with hypoxia due to Severe COVID-19 pneumonia - tested positive as outpatient on 06/11/20.  Presented with 1 week worsening SOB, cough, hypoxia and confirmed Covid-19 positive by PCR.   Complicated by underlying asthma and unvaccinated status. CRP trend: 17.3 >> 8.4 >> 3.2 >> 1.3 >> 0.6 normal on 10/24. 10/21: significant worsening overnight, now on heated HFNC at 35 L/min.   10/22: oxygen requirement increased to 55 L/min since yesterday.  CTA chest obtained negative for PE, showed multifocal PNA. 10/23: slight reduction in O2 need to 50 L/min, feeling better, LE duplex negative for DVT b/l. 10/24: pt feeling better, O2 requirement 50-60 L/min  10/25: still on heated HFNC, at 45 L/min 55% fio2 this AM 10/26: on 50 L/min, desats with any exertion, but feeling better --Completed remdesevir 10/19 >> 10/23 --Continue IV Solu-medrol 100 mg BID until improved O2 requirement (10/19 >> ) --Started on baricitinib x14 days or d/c (10/21 >> ) --Tylenol PRN fever, mild pain --Antitussives, Combivent q6h, Dulera BID --Follow inflammatory markers, CMP, CBC daily --Supplemental O2 as needed, maintain  spO2>90%, wean as tolerated   Superimposed Community-acquired Pneumonia - elevated procalcitonin, worsening respiratory status overnight following admission.   --Completed 5 day course Rocephin and doxycycline (allergy to azithromycin)  Hyperlipidemia - not on statin, managed by lifestyle.  PCP follow up.  GERD - on PPI  Obesity: Body mass index is 34.99 kg/m.  Complicates overall care and prognosis.   DVT prophylaxis: Lovenox   Diet:  Diet Orders (From admission, onward)    Start     Ordered   06/25/20 1834  Diet regular Room service appropriate? Yes; Fluid consistency: Thin  Diet effective now       Question Answer Comment  Room service appropriate? Yes   Fluid consistency: Thin      06/25/20 1835            Code Status: Full Code    Subjective 07/02/20    Pt seen at bedside this AM. Reports feeling quite a lot better.  Cough syrup helping a lot.  Wife was admitted yesterday evening, in room next door.  Updated patient on her status.  Hearing aids were lost sometime between ED and coming to floor, he needs them to be able to return to work.   Disposition Plan & Communication   Status is: Inpatient  Remains inpatient appropriate because:IV treatments appropriate due to intensity of illness or inability to take PO.  Patient has high oxygen requirement and remains on heated high flow, desats with exertion   Dispo: The patient is from: Home  Anticipated d/c is to: Home              Anticipated d/c date is: > 3 days              Patient currently is not medically stable to d/c.    Family Communication: patient's wife, Derek Cook, updated in person on rounds (also admitted).    Brother [name] (wife [name]) [phone].   Pt gives permission to update them any time.  I spoke with them on 10/23.   Consults, Procedures, Significant Events   Consultants:   None  Procedures:   None  Antimicrobials:  Anti-infectives (From admission, onward)    Start     Dose/Rate Route Frequency Ordered Stop   06/28/20 1000  cefTRIAXone (ROCEPHIN) 2 g in sodium chloride 0.9 % 100 mL IVPB        2 g 200 mL/hr over 30 Minutes Intravenous Every 24 hours 06/27/20 1600 07/01/20 1232   06/27/20 1000  cefTRIAXone (ROCEPHIN) 1 g in sodium chloride 0.9 % 100 mL IVPB  Status:  Discontinued        1 g 200 mL/hr over 30 Minutes Intravenous Every 24 hours 06/27/20 0854 06/27/20 1600   06/27/20 1000  doxycycline (VIBRAMYCIN) 100 mg in sodium chloride 0.9 % 250 mL IVPB  Status:  Discontinued        100 mg 125 mL/hr over 120 Minutes Intravenous Every 12 hours 06/27/20 0855 06/27/20 0937   06/27/20 1000  doxycycline (VIBRA-TABS) tablet 100 mg        100 mg Oral Every 12 hours 06/27/20 0937 07/01/20 2348   06/26/20 1000  remdesivir 100 mg in sodium chloride 0.9 % 100 mL IVPB       "Followed by" Linked Group Details   100 mg 200 mL/hr over 30 Minutes Intravenous Daily 06/25/20 1603 06/29/20 1020   06/25/20 1700  remdesivir 200 mg in sodium chloride 0.9% 250 mL IVPB       "Followed by" Linked Group Details   200 mg 580 mL/hr over 30 Minutes Intravenous Once 06/25/20 1603 06/25/20 1815        Objective   Vitals:   07/01/20 1200 07/01/20 2040 07/02/20 0500 07/02/20 0518  BP: 135/81 107/63  110/68  Pulse: 86 89  67  Resp: (!) 22 16  16   Temp: 98.2 F (36.8 C) 99.4 F (37.4 C)  98.1 F (36.7 C)  TempSrc: Axillary Oral    SpO2: 92% 96%  98%  Weight:   104.4 kg   Height:        Intake/Output Summary (Last 24 hours) at 07/02/2020 0746 Last data filed at 07/01/2020 1900 Gross per 24 hour  Intake --  Output 500 ml  Net -500 ml   Filed Weights   06/30/20 0500 07/01/20 0551 07/02/20 0500  Weight: 104.3 kg 104.3 kg 104.4 kg    Physical Exam:  General exam: awake, alert, no acute distress Respiratory system: overall clear, diminished bases, inspirations little deeper, normal respiratory effort, On heated HFNC at 50 L/min,55% FiO2. Cardiovascular  system: normal S1/S2, RRR, no pedal edema.   Central nervous system: A&O x3. no gross focal neurologic deficits, normal speech Psychiatry: normal mood, congruent affect, judgement and insight appear normal  Labs   Data Reviewed: I have personally reviewed following labs and imaging studies  CBC: Recent Labs  Lab 06/25/20 1438 06/26/20 0534 06/27/20 0509 07/02/20 0456  WBC 7.5 3.8* 8.4 14.3*  NEUTROABS 6.4 3.0  --   --  HGB 14.7 14.2 13.9 14.9  HCT 43.2 42.6 41.3 44.3  MCV 81.7 84.2 84.8 83.7  PLT 242 222 299 144*   Basic Metabolic Panel: Recent Labs  Lab 06/25/20 1438 06/25/20 1438 06/26/20 0534 06/26/20 0534 06/27/20 0509 06/27/20 0509 06/28/20 0641 06/29/20 0354 06/30/20 0447 07/01/20 0707 07/02/20 0456  NA 134*   < > 138   < > 138   < > 136 134* 134* 132* 133*  K 4.2   < > 4.1   < > 4.5   < > 4.4 4.6 4.7 4.4 4.5  CL 100   < > 103   < > 105   < > 104 103 103 100 103  CO2 22   < > 22   < > 20*   < > 22 23 24 23  21*  GLUCOSE 120*   < > 147*   < > 134*   < > 135* 160* 148* 126* 143*  BUN 20   < > 21*   < > 21*   < > 25* 26* 25* 26* 24*  CREATININE 0.93   < > 0.98   < > 0.74   < > 0.80 0.94 0.88 1.07 0.90  CALCIUM 8.6*   < > 8.7*   < > 8.3*   < > 8.5* 8.3* 8.4* 8.8* 8.5*  MG 2.4  --  2.5*  --  2.6*  --   --   --   --   --   --   PHOS  --   --  4.2  --   --   --   --   --   --   --   --    < > = values in this interval not displayed.   GFR: Estimated Creatinine Clearance: 107.3 mL/min (by C-G formula based on SCr of 0.9 mg/dL). Liver Function Tests: Recent Labs  Lab 06/28/20 0641 06/29/20 0354 06/30/20 0447 07/01/20 0707 07/02/20 0456  AST 46* 42* 43* 37 41  ALT 69* 69* 81* 85* 92*  ALKPHOS 64 57 55 55 62  BILITOT 1.0 0.9 1.0 1.0 1.0  PROT 6.4* 6.1* 6.1* 6.4* 6.2*  ALBUMIN 3.0* 3.0* 3.1* 3.2* 3.1*   Recent Labs  Lab 06/25/20 1438  LIPASE 29   No results for input(s): AMMONIA in the last 168 hours. Coagulation Profile: No results for input(s): INR,  PROTIME in the last 168 hours. Cardiac Enzymes: No results for input(s): CKTOTAL, CKMB, CKMBINDEX, TROPONINI in the last 168 hours. BNP (last 3 results) No results for input(s): PROBNP in the last 8760 hours. HbA1C: No results for input(s): HGBA1C in the last 72 hours. CBG: Recent Labs  Lab 06/26/20 2324  GLUCAP 128*   Lipid Profile: No results for input(s): CHOL, HDL, LDLCALC, TRIG, CHOLHDL, LDLDIRECT in the last 72 hours. Thyroid Function Tests: No results for input(s): TSH, T4TOTAL, FREET4, T3FREE, THYROIDAB in the last 72 hours. Anemia Panel: No results for input(s): VITAMINB12, FOLATE, FERRITIN, TIBC, IRON, RETICCTPCT in the last 72 hours. Sepsis Labs: Recent Labs  Lab 06/25/20 1438 06/26/20 0922 06/27/20 0509 06/28/20 0641  PROCALCITON  --  0.63 0.51 0.25  LATICACIDVEN 1.8  --   --   --     No results found for this or any previous visit (from the past 240 hour(s)).    Imaging Studies   No results found.   Medications   Scheduled Meds: . acidophilus  2 capsule Oral TID  . aspirin EC  81  mg Oral Daily  . baricitinib  4 mg Oral Daily  . dextromethorphan-guaiFENesin  1 tablet Oral BID  . enoxaparin (LOVENOX) injection  0.5 mg/kg Subcutaneous Q24H  . influenza vac split quadrivalent PF  0.5 mL Intramuscular Tomorrow-1000  . Ipratropium-Albuterol  1 puff Inhalation Q6H  . methylPREDNISolone (SOLU-MEDROL) injection  100 mg Intravenous Q12H  . mometasone-formoterol  2 puff Inhalation BID  . pantoprazole  40 mg Oral Daily  . pneumococcal 23 valent vaccine  0.5 mL Intramuscular Tomorrow-1000   Continuous Infusions:      LOS: 7 days    Time spent: 30 minutes with > 50% spent in coordination of care and direct patient contact.    Ezekiel Slocumb, DO [facility]  07/02/2020, 7:46 AM    If 7PM-7AM, please contact night-coverage. How to contact the [facility] Attending or Consulting provider [place] or covering provider during after hours [place], for  this patient?    1. Check the care team in [facility] and look for a) attending/consulting TRH provider listed and b) the [facility] team listed 2. Log into [email or web address] and use [place]'s universal password to access. If you do not have the password, please contact the hospital operator. 3. Locate the [facility] provider you are looking for under [facility] and page to a number that you can be directly reached. 4. If you still have difficulty reaching the provider, please page the [facility] (Director on Call) for the Hospitalists listed on amion for assistance.

## 2020-07-03 DIAGNOSIS — U071 COVID-19: Secondary | ICD-10-CM | POA: Diagnosis not present

## 2020-07-03 DIAGNOSIS — E871 Hypo-osmolality and hyponatremia: Secondary | ICD-10-CM

## 2020-07-03 DIAGNOSIS — J1282 Pneumonia due to coronavirus disease 2019: Secondary | ICD-10-CM | POA: Diagnosis not present

## 2020-07-03 DIAGNOSIS — J9601 Acute respiratory failure with hypoxia: Secondary | ICD-10-CM | POA: Diagnosis not present

## 2020-07-03 LAB — COMPREHENSIVE METABOLIC PANEL
ALT: 90 U/L — ABNORMAL HIGH (ref 0–44)
AST: 34 U/L (ref 15–41)
Albumin: 3.1 g/dL — ABNORMAL LOW (ref 3.5–5.0)
Alkaline Phosphatase: 50 U/L (ref 38–126)
Anion gap: 8 (ref 5–15)
BUN: 22 mg/dL — ABNORMAL HIGH (ref 6–20)
CO2: 23 mmol/L (ref 22–32)
Calcium: 8.5 mg/dL — ABNORMAL LOW (ref 8.9–10.3)
Chloride: 101 mmol/L (ref 98–111)
Creatinine, Ser: 0.74 mg/dL (ref 0.61–1.24)
GFR, Estimated: >60 mL/min (ref >60)
Glucose, Bld: 114 mg/dL — ABNORMAL HIGH (ref 70–99)
Potassium: 4.4 mmol/L (ref 3.5–5.1)
Sodium: 132 mmol/L — ABNORMAL LOW (ref 135–145)
Total Bilirubin: 0.8 mg/dL (ref 0.3–1.2)
Total Protein: 6.1 g/dL — ABNORMAL LOW (ref 6.5–8.1)

## 2020-07-03 LAB — C-REACTIVE PROTEIN: CRP: 0.5 mg/dL (ref <1.0)

## 2020-07-03 LAB — FIBRIN DERIVATIVES D-DIMER (ARMC ONLY): Fibrin derivatives D-dimer (ARMC): 1462.84 ng/mL (FEU) — ABNORMAL HIGH (ref 0.00–499.00)

## 2020-07-03 MED ORDER — ENOXAPARIN SODIUM 60 MG/0.6ML ~~LOC~~ SOLN
0.5000 mg/kg | SUBCUTANEOUS | Status: DC
  Administered 2020-07-03 – 2020-07-13 (×11): 52.5 mg via SUBCUTANEOUS
  Filled 2020-07-03 (×12): qty 0.6

## 2020-07-03 MED ORDER — FUROSEMIDE 10 MG/ML IJ SOLN
40.0000 mg | Freq: Once | INTRAMUSCULAR | Status: AC
  Administered 2020-07-03: 40 mg via INTRAVENOUS
  Filled 2020-07-03: qty 4

## 2020-07-03 NOTE — Progress Notes (Signed)
PROGRESS NOTE    VIC ESCO   GYI:[id number]  DOB: 08-04-1961  PCP: Olin Hauser, DO    DOA: 06/25/2020 LOS: 8   Brief Narrative   Derek Cook is a 59 y.o. male with medical history significant for COPD, mild persistent asthma, GERD, who presented to the ED on 06/25/2020 with 1 week history of progressive shortness of breath, nonproductive cough, increased fatigue.  Recent travel to [place], no known sick Covid contacts, but is unvaccinated.  Admitted for acute respiratory failure with hypoxia due to severe Covid-19 pneumonia.     Assessment & Plan   Acute Hypoxic Resp. Failure/Pneumonia due to COVID-19  FiO2 (%):  [50 %] 50 %     Component Value Date/Time   PHART 7.42 06/26/2020 0500   PCO2ART 33 06/26/2020 0500   PO2ART 63 (L) 06/26/2020 0500   HCO3 21.4 06/26/2020 0500   ACIDBASEDEF 2.2 (H) 06/26/2020 0500   O2SAT 92.2 06/26/2020 0500    Recent Labs  Lab 06/27/20 0509 06/27/20 0509 06/28/20 0641 06/28/20 0641 06/29/20 0354 06/30/20 0446 06/30/20 0447 07/01/20 0707 07/02/20 0456 07/03/20 0605  CRP 8.4*   < > 3.2*   < > 1.3* 0.6  --  0.5 1.7* 0.5  ALT 72*   < > 69*   < > 69*  --  81* 85* 92* 90*  PROCALCITON 0.51  --  0.25  --   --   --   --   --   --   --    < > = values in this interval not displayed.   D Dimer at [facility] ng/ml: [phone]--1997--1462  Objective findings:  Oxygen requirements: Heated high flow 40 L/min.  50% FiO2.  COVID 19 Therapeutics: Antibacterials: None currently Remdesivir: Completed course of Remdesivir on 10/23 Steroids: Solu-Medrol 100 mg every 12 hours Diuretics: None Inhaled Steroids: On Dulera Baricitinib: On baricitinib, initiated on 10/21 PUD Prophylaxis: Protonix DVT Prophylaxis:  Lovenox once a day  Patient remains tenuous from a respiratory standpoint.  Requiring high amounts of oxygen.  Saturating in the late 80s to early 90s.  He has completed a course of Remdesivir.  He remains on systemic  and inhaled steroids.  Also remains on baricitinib.  His inflammatory markers have improved.  D-dimer was significantly weighted few days ago but this has improved over the last few days.  CT angiogram and lower extremity Doppler studies were negative for any venous thromboembolism.  Due to elevated procalcitonin patient was given a 5-day course of ceftriaxone and doxycycline.  We will give a dose of Lasix today.  Discussed with patient.  Continue to wean down oxygen. Continue to mobilize.  Incentive spirometry.  Patient encouraged to stay in prone position as much as possible.    The treatment plan and use of medications and known side effects were discussed with patient/family. Some of the medications used are based on case reports/anecdotal data.  All other medications being used in the management of COVID-19 based on limited study data.  Complete risks and long-term side effects are unknown, however in the best clinical judgment they seem to be of some benefit.  Patient/family wanted to proceed with treatment options provided.  Hyponatremia Stable.  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  History of GERD Continue PPI  Hyperlipidemia Managed by lifestyle changes.  Obesity Estimated body mass index is 34.93 kg/m as calculated from the following:   Height as of this encounter: 5\' 8"  (1.727 m).   Weight  as of this encounter: 104.2 kg.  DVT prophylaxis: Lovenox   Code Status: Full Code     Subjective 07/03/20    Patient states that he is feeling slightly better compared to the last few days.  Shortness of breath is improving.  Continues to have a cough which is dry for the most part.  Denies any chest pain.  No nausea vomiting.  Disposition Plan & Communication   Status is: Inpatient  Remains inpatient appropriate because:IV treatments appropriate due to intensity of illness or inability to take PO.  Patient has high oxygen requirement and remains on heated high  flow, desats with exertion   Dispo: The patient is from: Home              Anticipated d/c is to: Home              Anticipated d/c date is: > 3 days              Patient currently is not medically stable to d/c.    Family Communication: No family at bedside.  Apparently his wife is also hospitalized due to Covid.    Brother Asah Lamay (wife [name]) [phone].   Pt gives permission to update them any time.     Consults, Procedures, Significant Events   Consultants:   None  Procedures:   None  Antimicrobials:  Anti-infectives (From admission, onward)   Start     Dose/Rate Route Frequency Ordered Stop   06/28/20 1000  cefTRIAXone (ROCEPHIN) 2 g in sodium chloride 0.9 % 100 mL IVPB        2 g 200 mL/hr over 30 Minutes Intravenous Every 24 hours 06/27/20 1600 07/01/20 1232   06/27/20 1000  cefTRIAXone (ROCEPHIN) 1 g in sodium chloride 0.9 % 100 mL IVPB  Status:  Discontinued        1 g 200 mL/hr over 30 Minutes Intravenous Every 24 hours 06/27/20 0854 06/27/20 1600   06/27/20 1000  doxycycline (VIBRAMYCIN) 100 mg in sodium chloride 0.9 % 250 mL IVPB  Status:  Discontinued        100 mg 125 mL/hr over 120 Minutes Intravenous Every 12 hours 06/27/20 0855 06/27/20 0937   06/27/20 1000  doxycycline (VIBRA-TABS) tablet 100 mg        100 mg Oral Every 12 hours 06/27/20 0937 07/01/20 2348   06/26/20 1000  remdesivir 100 mg in sodium chloride 0.9 % 100 mL IVPB       "Followed by" Linked Group Details   100 mg 200 mL/hr over 30 Minutes Intravenous Daily 06/25/20 1603 06/29/20 1020   06/25/20 1700  remdesivir 200 mg in sodium chloride 0.9% 250 mL IVPB       "Followed by" Linked Group Details   200 mg 580 mL/hr over 30 Minutes Intravenous Once 06/25/20 1603 06/25/20 1815        Objective   Vitals:   07/03/20 0159 07/03/20 0440 07/03/20 0730 07/03/20 1127  BP:  120/74 109/75 121/73  Pulse:  (!) 57 63 77  Resp:  19 20 20   Temp:  97.9 F (36.6 C) 98.4 F (36.9 C) 98.5 F  (36.9 C)  TempSrc:  Oral Oral Oral  SpO2:  93% 94% 94%  Weight: 104.2 kg     Height:        Intake/Output Summary (Last 24 hours) at 07/03/2020 1203 Last data filed at 07/03/2020 0225 Gross per 24 hour  Intake --  Output 500 ml  Net -500  ml   Filed Weights   07/01/20 0551 07/02/20 0500 07/03/20 0159  Weight: 104.3 kg 104.4 kg 104.2 kg    Physical Exam:  General appearance: Awake alert.  In no distress Resp: Tachypneic at rest.  Coarse breath sounds with crackles bilateral bases.  No wheezing or rhonchi. Cardio: S1-S2 is normal regular.  No S3-S4.  No rubs murmurs or bruit GI: Abdomen is soft.  Nontender nondistended.  Bowel sounds are present normal.  No masses organomegaly Extremities: No edema.  Full range of motion of lower extremities. Neurologic: Alert and oriented x3.  No focal neurological deficits.    Labs   Data Reviewed: I have personally reviewed following labs and imaging studies  CBC: Recent Labs  Lab 06/27/20 0509 07/02/20 0456  WBC 8.4 14.3*  HGB 13.9 14.9  HCT 41.3 44.3  MCV 84.8 83.7  PLT 299 676*   Basic Metabolic Panel: Recent Labs  Lab 06/27/20 0509 06/28/20 0641 06/29/20 0354 06/30/20 0447 07/01/20 0707 07/02/20 0456 07/03/20 0605  NA 138   < > 134* 134* 132* 133* 132*  K 4.5   < > 4.6 4.7 4.4 4.5 4.4  CL 105   < > 103 103 100 103 101  CO2 20*   < > 23 24 23  21* 23  GLUCOSE 134*   < > 160* 148* 126* 143* 114*  BUN 21*   < > 26* 25* 26* 24* 22*  CREATININE 0.74   < > 0.94 0.88 1.07 0.90 0.74  CALCIUM 8.3*   < > 8.3* 8.4* 8.8* 8.5* 8.5*  MG 2.6*  --   --   --   --   --   --    < > = values in this interval not displayed.   GFR: Estimated Creatinine Clearance: 120.6 mL/min (by C-G formula based on SCr of 0.74 mg/dL). Liver Function Tests: Recent Labs  Lab 06/29/20 0354 06/30/20 0447 07/01/20 0707 07/02/20 0456 07/03/20 0605  AST 42* 43* 37 41 34  ALT 69* 81* 85* 92* 90*  ALKPHOS 57 55 55 62 50  BILITOT 0.9 1.0 1.0 1.0 0.8   PROT 6.1* 6.1* 6.4* 6.2* 6.1*  ALBUMIN 3.0* 3.1* 3.2* 3.1* 3.1*   CBG: Recent Labs  Lab 06/26/20 2324  GLUCAP 128*   Sepsis Labs: Recent Labs  Lab 06/27/20 0509 06/28/20 0641  PROCALCITON 0.51 0.25    Imaging Studies   No results found.   Medications   Scheduled Meds: . acidophilus  2 capsule Oral TID  . aspirin EC  81 mg Oral Daily  . baricitinib  4 mg Oral Daily  . dextromethorphan-guaiFENesin  1 tablet Oral BID  . enoxaparin (LOVENOX) injection  0.5 mg/kg Subcutaneous Q24H  . influenza vac split quadrivalent PF  0.5 mL Intramuscular Tomorrow-1000  . Ipratropium-Albuterol  1 puff Inhalation Q6H  . methylPREDNISolone (SOLU-MEDROL) injection  100 mg Intravenous Q12H  . mometasone-formoterol  2 puff Inhalation BID  . pantoprazole  40 mg Oral Daily  . pneumococcal 23 valent vaccine  0.5 mL Intramuscular Tomorrow-1000   Continuous Infusions:      LOS: 8 days    Bonnielee Haff,  [facility]  07/03/2020, 12:03 PM    If 7PM-7AM, please contact night-coverage. How to contact the [facility] Attending or Consulting provider [place] or covering provider during after hours [place], for this patient?    1. Check the care team in [facility] and look for a) attending/consulting TRH provider listed and b) the [place]  team listed 2. Log into [email or web address] and use [place]'s universal password to access. If you do not have the password, please contact the hospital operator. 3. Locate the [facility] provider you are looking for under [facility] and page to a number that you can be directly reached. 4. If you still have difficulty reaching the provider, please page the [facility] (Director on Call) for the Hospitalists listed on amion for assistance.

## 2020-07-04 DIAGNOSIS — J1282 Pneumonia due to coronavirus disease 2019: Secondary | ICD-10-CM | POA: Diagnosis not present

## 2020-07-04 DIAGNOSIS — E871 Hypo-osmolality and hyponatremia: Secondary | ICD-10-CM | POA: Diagnosis not present

## 2020-07-04 DIAGNOSIS — U071 COVID-19: Secondary | ICD-10-CM | POA: Diagnosis not present

## 2020-07-04 DIAGNOSIS — J9601 Acute respiratory failure with hypoxia: Secondary | ICD-10-CM | POA: Diagnosis not present

## 2020-07-04 LAB — COMPREHENSIVE METABOLIC PANEL
ALT: 98 U/L — ABNORMAL HIGH (ref 0–44)
AST: 43 U/L — ABNORMAL HIGH (ref 15–41)
Albumin: 3.2 g/dL — ABNORMAL LOW (ref 3.5–5.0)
Alkaline Phosphatase: 58 U/L (ref 38–126)
Anion gap: 10 (ref 5–15)
BUN: 27 mg/dL — ABNORMAL HIGH (ref 6–20)
CO2: 23 mmol/L (ref 22–32)
Calcium: 8.5 mg/dL — ABNORMAL LOW (ref 8.9–10.3)
Chloride: 98 mmol/L (ref 98–111)
Creatinine, Ser: 0.95 mg/dL (ref 0.61–1.24)
GFR, Estimated: >60 mL/min (ref >60)
Glucose, Bld: 134 mg/dL — ABNORMAL HIGH (ref 70–99)
Potassium: 4.6 mmol/L (ref 3.5–5.1)
Sodium: 131 mmol/L — ABNORMAL LOW (ref 135–145)
Total Bilirubin: 1.1 mg/dL (ref 0.3–1.2)
Total Protein: 6.3 g/dL — ABNORMAL LOW (ref 6.5–8.1)

## 2020-07-04 LAB — FIBRIN DERIVATIVES D-DIMER (ARMC ONLY): Fibrin derivatives D-dimer (ARMC): 1263.46 ng/mL (FEU) — ABNORMAL HIGH (ref 0.00–499.00)

## 2020-07-04 MED ORDER — FUROSEMIDE 10 MG/ML IJ SOLN
40.0000 mg | Freq: Once | INTRAMUSCULAR | Status: AC
  Administered 2020-07-04: 40 mg via INTRAVENOUS
  Filled 2020-07-04: qty 4

## 2020-07-04 NOTE — Progress Notes (Addendum)
PROGRESS NOTE    Derek Cook   EXB:[id number]  DOB: 1960-12-14  PCP: Olin Hauser, DO    DOA: 06/25/2020 LOS: 9   Brief Narrative   Derek Cook is a 59 y.o. male with medical history significant for COPD, mild persistent asthma, GERD, who presented to the ED on 06/25/2020 with 1 week history of progressive shortness of breath, nonproductive cough, increased fatigue.  Recent travel to [place], no known sick Covid contacts, but is unvaccinated.  Admitted for acute respiratory failure with hypoxia due to severe Covid-19 pneumonia.     Assessment & Plan   Acute Hypoxic Resp. Failure/Pneumonia due to COVID-19   Recent Labs  Lab 06/28/20 0641 06/28/20 0641 06/29/20 0354 06/29/20 0354 06/30/20 0446 06/30/20 0447 07/01/20 0707 07/02/20 0456 07/03/20 0605 07/04/20 0336  CRP 3.2*   < > 1.3*  --  0.6  --  0.5 1.7* 0.5  --   ALT 69*   < > 69*   < >  --  81* 85* 92* 90* 98*  PROCALCITON 0.25  --   --   --   --   --   --   --   --   --    < > = values in this interval not displayed.   D Dimer at [facility] ng/ml: [phone]--1997--1462--1263  Objective findings: Oxygen requirements: Continues to be on 40 L of high flow nasal cannula, 40% FiO2. Saturations noted to be in the early 16s.  COVID 19 Therapeutics: Antibacterials: None currently Remdesivir: Completed course of Remdesivir on 10/23 Steroids: Solu-Medrol 100 mg every 12 hours Diuretics: None Inhaled Steroids: On Dulera Baricitinib: On baricitinib, initiated on 10/21 PUD Prophylaxis: Protonix DVT Prophylaxis:  Lovenox once a day  Patient remains tenuous from a respiratory standpoint.  He has completed course of Remdesivir.  Remains on steroids and baricitinib.  Continue with incentive spirometry mobilization.  Out of bed to chair.  Discussed with nursing staff today.  He did diurese well with furosemide yesterday.  Started feeling better.  Will give additional dose today.  Sodium noted to be stable  this morning.  Elevated D-dimer was noticed.  He underwent CT angiogram and lower extremity Dopplers which were negative for venous thromboembolism.  D-dimer has been improving.  CRP was normal when checked yesterday.  Due to elevated procalcitonin patient was given a 5-day course of ceftriaxone and doxycycline.  Patient encouraged to stay in prone position as much as possible.    The treatment plan and use of medications and known side effects were discussed with patient/family. Some of the medications used are based on case reports/anecdotal data.  All other medications being used in the management of COVID-19 based on limited study data.  Complete risks and long-term side effects are unknown, however in the best clinical judgment they seem to be of some benefit.  Patient/family wanted to proceed with treatment options provided.  Hyponatremia Remains stable.  Continue to monitor while he is getting furosemide.  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  Mild transaminitis Most likely due to COVID-19 and Remdesivir and baricitinib.  Monitor periodically.  History of GERD Continue PPI  Hyperlipidemia Managed by lifestyle changes.  Obesity Estimated body mass index is 34.82 kg/m as calculated from the following:   Height as of this encounter: 5\' 8"  (1.727 m).   Weight as of this encounter: 103.9 kg.  DVT prophylaxis: Lovenox   Code Status: Full Code     Subjective 07/04/20  States that he is feeling better.  He states that he started feeling better after he urinated a lot yesterday once he was given his furosemide.  Denies any chest pain.  Looking forward to sitting up in the chair today.  Discussed this with the nursing staff.    Disposition Plan & Communication   Status is: Inpatient  Remains inpatient appropriate because:IV treatments appropriate due to intensity of illness or inability to take PO.  Patient has high oxygen requirement and remains on heated  high flow, desats with exertion   Dispo: The patient is from: Home              Anticipated d/c is to: Home              Anticipated d/c date is: > 3 days              Patient currently is not medically stable to d/c.    Family Communication: No family at bedside.  Apparently his wife is also hospitalized due to Covid.    Brother Derek Cook (wife [name]) [phone].   Pt gives permission to update them any time.     Consults, Procedures, Significant Events   Consultants:   None  Procedures:   None  Antimicrobials:  Anti-infectives (From admission, onward)   Start     Dose/Rate Route Frequency Ordered Stop   06/28/20 1000  cefTRIAXone (ROCEPHIN) 2 g in sodium chloride 0.9 % 100 mL IVPB        2 g 200 mL/hr over 30 Minutes Intravenous Every 24 hours 06/27/20 1600 07/01/20 1232   06/27/20 1000  cefTRIAXone (ROCEPHIN) 1 g in sodium chloride 0.9 % 100 mL IVPB  Status:  Discontinued        1 g 200 mL/hr over 30 Minutes Intravenous Every 24 hours 06/27/20 0854 06/27/20 1600   06/27/20 1000  doxycycline (VIBRAMYCIN) 100 mg in sodium chloride 0.9 % 250 mL IVPB  Status:  Discontinued        100 mg 125 mL/hr over 120 Minutes Intravenous Every 12 hours 06/27/20 0855 06/27/20 0937   06/27/20 1000  doxycycline (VIBRA-TABS) tablet 100 mg        100 mg Oral Every 12 hours 06/27/20 0937 07/01/20 2348   06/26/20 1000  remdesivir 100 mg in sodium chloride 0.9 % 100 mL IVPB       "Followed by" Linked Group Details   100 mg 200 mL/hr over 30 Minutes Intravenous Daily 06/25/20 1603 06/29/20 1020   06/25/20 1700  remdesivir 200 mg in sodium chloride 0.9% 250 mL IVPB       "Followed by" Linked Group Details   200 mg 580 mL/hr over 30 Minutes Intravenous Once 06/25/20 1603 06/25/20 1815        Objective   Vitals:   07/04/20 0415 07/04/20 0420 07/04/20 0450 07/04/20 0748  BP:   117/80 115/65  Pulse: 62 (!) 59 100 72  Resp: 17 15 18 20   Temp:   98.2 F (36.8 C) 97.9 F (36.6 C)    TempSrc:   Oral Oral  SpO2: 95% 96% 97% 93%  Weight:   103.9 kg   Height:        Intake/Output Summary (Last 24 hours) at 07/04/2020 1004 Last data filed at 07/04/2020 0750 Gross per 24 hour  Intake --  Output 2125 ml  Net -2125 ml   Filed Weights   07/02/20 0500 07/03/20 0159 07/04/20 0450  Weight: 104.4 kg 104.2 kg  103.9 kg    Physical Exam:  General appearance: Awake alert.  In no distress Resp: Improved effort noted today.  Not as tachypneic as yesterday.  Coarse breath sounds with a few crackles at the bases.  No wheezing or rhonchi.   Cardio: S1-S2 is normal regular.  No S3-S4.  No rubs murmurs or bruit GI: Abdomen is soft.  Nontender nondistended.  Bowel sounds are present normal.  No masses organomegaly Extremities: No edema.  Full range of motion of lower extremities. Neurologic: Alert and oriented x3.  No focal neurological deficits.      Labs   Data Reviewed: I have personally reviewed following labs and imaging studies  CBC: Recent Labs  Lab 07/02/20 0456  WBC 14.3*  HGB 14.9  HCT 44.3  MCV 83.7  PLT 941*   Basic Metabolic Panel: Recent Labs  Lab 06/30/20 0447 07/01/20 0707 07/02/20 0456 07/03/20 0605 07/04/20 0336  NA 134* 132* 133* 132* 131*  K 4.7 4.4 4.5 4.4 4.6  CL 103 100 103 101 98  CO2 24 23 21* 23 23  GLUCOSE 148* 126* 143* 114* 134*  BUN 25* 26* 24* 22* 27*  CREATININE 0.88 1.07 0.90 0.74 0.95  CALCIUM 8.4* 8.8* 8.5* 8.5* 8.5*   GFR: Estimated Creatinine Clearance: 101.4 mL/min (by C-G formula based on SCr of 0.95 mg/dL). Liver Function Tests: Recent Labs  Lab 06/30/20 0447 07/01/20 0707 07/02/20 0456 07/03/20 0605 07/04/20 0336  AST 43* 37 41 34 43*  ALT 81* 85* 92* 90* 98*  ALKPHOS 55 55 62 50 58  BILITOT 1.0 1.0 1.0 0.8 1.1  PROT 6.1* 6.4* 6.2* 6.1* 6.3*  ALBUMIN 3.1* 3.2* 3.1* 3.1* 3.2*   CBG: No results for input(s): GLUCAP in the last 168 hours. Sepsis Labs: Recent Labs  Lab 06/28/20 0641  PROCALCITON  0.25    Imaging Studies   No results found.   Medications   Scheduled Meds: . acidophilus  2 capsule Oral TID  . aspirin EC  81 mg Oral Daily  . baricitinib  4 mg Oral Daily  . dextromethorphan-guaiFENesin  1 tablet Oral BID  . enoxaparin (LOVENOX) injection  0.5 mg/kg Subcutaneous Q24H  . influenza vac split quadrivalent PF  0.5 mL Intramuscular Tomorrow-1000  . Ipratropium-Albuterol  1 puff Inhalation Q6H  . methylPREDNISolone (SOLU-MEDROL) injection  100 mg Intravenous Q12H  . mometasone-formoterol  2 puff Inhalation BID  . pantoprazole  40 mg Oral Daily  . pneumococcal 23 valent vaccine  0.5 mL Intramuscular Tomorrow-1000   Continuous Infusions:      LOS: 9 days    Bonnielee Haff,  [facility]  07/04/2020, 10:04 AM    If 7PM-7AM, please contact night-coverage. How to contact the [facility] Attending or Consulting provider [place] or covering provider during after hours [place], for this patient?    1. Check the care team in [facility] and look for a) attending/consulting TRH provider listed and b) the [facility] team listed 2. Log into [email or web address] and use [place]'s universal password to access. If you do not have the password, please contact the hospital operator. 3. Locate the [facility] provider you are looking for under [facility] and page to a number that you can be directly reached. 4. If you still have difficulty reaching the provider, please page the [facility] (Director on Call) for the Hospitalists listed on amion for assistance.

## 2020-07-05 ENCOUNTER — Inpatient Hospital Stay: Payer: 59

## 2020-07-05 DIAGNOSIS — U071 COVID-19: Secondary | ICD-10-CM | POA: Diagnosis not present

## 2020-07-05 DIAGNOSIS — E871 Hypo-osmolality and hyponatremia: Secondary | ICD-10-CM | POA: Diagnosis not present

## 2020-07-05 DIAGNOSIS — J9601 Acute respiratory failure with hypoxia: Secondary | ICD-10-CM | POA: Diagnosis not present

## 2020-07-05 DIAGNOSIS — J1282 Pneumonia due to coronavirus disease 2019: Secondary | ICD-10-CM | POA: Diagnosis not present

## 2020-07-05 LAB — COMPREHENSIVE METABOLIC PANEL
ALT: 106 U/L — ABNORMAL HIGH (ref 0–44)
AST: 42 U/L — ABNORMAL HIGH (ref 15–41)
Albumin: 3.3 g/dL — ABNORMAL LOW (ref 3.5–5.0)
Alkaline Phosphatase: 61 U/L (ref 38–126)
Anion gap: 9 (ref 5–15)
BUN: 26 mg/dL — ABNORMAL HIGH (ref 6–20)
CO2: 24 mmol/L (ref 22–32)
Calcium: 8.8 mg/dL — ABNORMAL LOW (ref 8.9–10.3)
Chloride: 98 mmol/L (ref 98–111)
Creatinine, Ser: 0.98 mg/dL (ref 0.61–1.24)
GFR, Estimated: >60 mL/min (ref >60)
Glucose, Bld: 143 mg/dL — ABNORMAL HIGH (ref 70–99)
Potassium: 4.9 mmol/L (ref 3.5–5.1)
Sodium: 131 mmol/L — ABNORMAL LOW (ref 135–145)
Total Bilirubin: 1.3 mg/dL — ABNORMAL HIGH (ref 0.3–1.2)
Total Protein: 6.4 g/dL — ABNORMAL LOW (ref 6.5–8.1)

## 2020-07-05 LAB — CBC
HCT: 44.5 % (ref 39.0–52.0)
Hemoglobin: 15.2 g/dL (ref 13.0–17.0)
MCH: 28.6 pg (ref 26.0–34.0)
MCHC: 34.2 g/dL (ref 30.0–36.0)
MCV: 83.8 fL (ref 80.0–100.0)
Platelets: 557 10*3/uL — ABNORMAL HIGH (ref 150–400)
RBC: 5.31 MIL/uL (ref 4.22–5.81)
RDW: 14.4 % (ref 11.5–15.5)
WBC: 18.4 10*3/uL — ABNORMAL HIGH (ref 4.0–10.5)
nRBC: 0.1 % (ref 0.0–0.2)

## 2020-07-05 LAB — FIBRIN DERIVATIVES D-DIMER (ARMC ONLY): Fibrin derivatives D-dimer (ARMC): 1085.87 ng/mL (FEU) — ABNORMAL HIGH (ref 0.00–499.00)

## 2020-07-05 MED ORDER — ENSURE ENLIVE PO LIQD
237.0000 mL | Freq: Three times a day (TID) | ORAL | Status: DC
  Administered 2020-07-05 – 2020-07-14 (×25): 237 mL via ORAL

## 2020-07-05 MED ORDER — FUROSEMIDE 10 MG/ML IJ SOLN
40.0000 mg | Freq: Once | INTRAMUSCULAR | Status: AC
  Administered 2020-07-05: 40 mg via INTRAVENOUS
  Filled 2020-07-05: qty 4

## 2020-07-05 MED ORDER — ADULT MULTIVITAMIN W/MINERALS CH
1.0000 | ORAL_TABLET | Freq: Every day | ORAL | Status: DC
  Administered 2020-07-06 – 2020-07-14 (×9): 1 via ORAL
  Filled 2020-07-05 (×9): qty 1

## 2020-07-05 NOTE — Progress Notes (Addendum)
PROGRESS NOTE    Derek Cook   WLN:[id number]  DOB: [birth year]  PCP: Derek Hauser, DO    DOA: 06/25/2020 LOS: 10   Brief Narrative   Derek Cook is a 59 y.o. male with medical history significant for COPD, mild persistent asthma, GERD, who presented to the ED on 06/25/2020 with 1 week history of progressive shortness of breath, nonproductive cough, increased fatigue.  Recent travel to [place], no known sick Covid contacts, but is unvaccinated.  Admitted for acute respiratory failure with hypoxia due to severe Covid-19 pneumonia.     Assessment & Plan   Acute Hypoxic Resp. Failure/Pneumonia due to COVID-19   Recent Labs  Lab 06/29/20 0354 06/30/20 0446 06/30/20 0447 07/01/20 0707 07/02/20 0456 07/03/20 0605 07/04/20 0336 07/05/20 0517  CRP 1.3* 0.6  --  0.5 1.7* 0.5  --   --   ALT 69*  --    < > 85* 92* 90* 98* 106*   < > = values in this interval not displayed.   D Dimer at [facility] ng/ml: 7500--5539--3263--2335--1997--1462--1263--1085  Objective findings: Oxygen requirements: Patient noted to be on heated high flow at 40 L/min.  He was dropped down to 38 L.  Discussed with nursing staff who will continue to monitor.  Saturations were noted to be in the mid 90s.    COVID 19 Therapeutics: Antibacterials: None currently Remdesivir: Completed course of Remdesivir on 10/23 Steroids: Solu-Medrol 100 mg every 12 hours Diuretics: Given furosemide on 10/27 on 10/28.  We will repeat dose today. Inhaled Steroids: On Dulera Baricitinib: On baricitinib, initiated on 10/21 PUD Prophylaxis: Protonix DVT Prophylaxis:  Lovenox once a day  Patient seems to be improving from a respiratory standpoint though he remains tenuous.  Still requiring a lot of oxygen.  Should be able to wean him down further.  We will repeat dose of furosemide today.  Chest x-ray from this morning reviewed.  No worsening noted.  Stable findings.  No new findings.  Continue with baricitinib and  steroids.  He has completed course of Remdesivir.  Incentive spirometry and mobilization.  Out of bed to chair.  Sodium level remains stable.  Leukocytosis is due to steroids.  He is afebrile.  Elevated D-dimer was noticed.  He underwent CT angiogram and lower extremity Dopplers which were negative for venous thromboembolism.  D-dimer has been improving.   Due to elevated procalcitonin patient was given a 5-day course of ceftriaxone and doxycycline.  The treatment plan and use of medications and known side effects were discussed with patient/family. Some of the medications used are based on case reports/anecdotal data.  All other medications being used in the management of COVID-19 based on limited study data.  Complete risks and long-term side effects are unknown, however in the best clinical judgment they seem to be of some benefit.  Patient/family wanted to proceed with treatment options provided.  Hyponatremia Remains stable.  Continue to monitor while he is getting furosemide.  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  Mild transaminitis Most likely due to COVID-19 and Remdesivir and baricitinib.  Continue to monitor.  History of GERD Continue PPI  Hyperlipidemia Managed by lifestyle changes.  Obesity Estimated body mass index is 34.61 kg/m as calculated from the following:   Height as of this encounter: 5\' 8"  (1.727 m).   Weight as of this encounter: 103.2 kg.  DVT prophylaxis: Lovenox   Code Status: Full Code     Subjective 07/05/20  Patient states that he is feeling better.  Feels stronger.  Shortness of breath is improving.  Denies any chest pain nausea or vomiting.    Disposition Plan & Communication   Status is: Inpatient  Remains inpatient appropriate because:IV treatments appropriate due to intensity of illness or inability to take PO.  Patient has high oxygen requirement and remains on heated high flow, desats with exertion   Dispo: The patient  is from: Home              Anticipated d/c is to: Home              Anticipated d/c date is: > 3 days              Patient currently is not medically stable to d/c.    Family Communication: No family at bedside.  Apparently his wife is also hospitalized due to Covid.    Brother Derek Cook (wife Derek Cook) [phone].     Consults, Procedures, Significant Events   Consultants:   None  Procedures:   None  Antimicrobials:  Anti-infectives (From admission, onward)   Start     Dose/Rate Route Frequency Ordered Stop   06/28/20 1000  cefTRIAXone (ROCEPHIN) 2 g in sodium chloride 0.9 % 100 mL IVPB        2 g 200 mL/hr over 30 Minutes Intravenous Every 24 hours 06/27/20 1600 07/01/20 1232   06/27/20 1000  cefTRIAXone (ROCEPHIN) 1 g in sodium chloride 0.9 % 100 mL IVPB  Status:  Discontinued        1 g 200 mL/hr over 30 Minutes Intravenous Every 24 hours 06/27/20 0854 06/27/20 1600   06/27/20 1000  doxycycline (VIBRAMYCIN) 100 mg in sodium chloride 0.9 % 250 mL IVPB  Status:  Discontinued        100 mg 125 mL/hr over 120 Minutes Intravenous Every 12 hours 06/27/20 0855 06/27/20 0937   06/27/20 1000  doxycycline (VIBRA-TABS) tablet 100 mg        100 mg Oral Every 12 hours 06/27/20 0937 07/01/20 2348   06/26/20 1000  remdesivir 100 mg in sodium chloride 0.9 % 100 mL IVPB       "Followed by" Linked Group Details   100 mg 200 mL/hr over 30 Minutes Intravenous Daily 06/25/20 1603 06/29/20 1020   06/25/20 1700  remdesivir 200 mg in sodium chloride 0.9% 250 mL IVPB       "Followed by" Linked Group Details   200 mg 580 mL/hr over 30 Minutes Intravenous Once 06/25/20 1603 06/25/20 1815        Objective   Vitals:   07/04/20 1949 07/04/20 1950 07/05/20 0435 07/05/20 0600  BP:   117/62   Pulse: 82 79 71   Resp: 20 17 16    Temp:   98.1 F (36.7 C)   TempSrc:   Oral   SpO2: 92% 96% 92%   Weight:    103.2 kg  Height:        Intake/Output Summary (Last 24 hours) at 07/05/2020  1007 Last data filed at 07/05/2020 0428 Gross per 24 hour  Intake 720 ml  Output 570 ml  Net 150 ml   Filed Weights   07/03/20 0159 07/04/20 0450 07/05/20 0600  Weight: 104.2 kg 103.9 kg 103.2 kg    Physical Exam:  General appearance: Awake alert.  In no distress Resp: Normal effort at rest.  Coarse breath sounds with crackles bilateral bases.  No wheezing or rhonchi.   Cardio: S1-S2  is normal regular.  No S3-S4.  No rubs murmurs or bruit GI: Abdomen is soft.  Nontender nondistended.  Bowel sounds are present normal.  No masses organomegaly Extremities: No edema.  Full range of motion of lower extremities. Neurologic: Alert and oriented x3.  No focal neurological deficits.       Labs   Data Reviewed: I have personally reviewed following labs and imaging studies  CBC: Recent Labs  Lab 07/02/20 0456 07/05/20 0517  WBC 14.3* 18.4*  HGB 14.9 15.2  HCT 44.3 44.5  MCV 83.7 83.8  PLT 590* 412*   Basic Metabolic Panel: Recent Labs  Lab 07/01/20 0707 07/02/20 0456 07/03/20 0605 07/04/20 0336 07/05/20 0517  NA 132* 133* 132* 131* 131*  K 4.4 4.5 4.4 4.6 4.9  CL 100 103 101 98 98  CO2 23 21* 23 23 24   GLUCOSE 126* 143* 114* 134* 143*  BUN 26* 24* 22* 27* 26*  CREATININE 1.07 0.90 0.74 0.95 0.98  CALCIUM 8.8* 8.5* 8.5* 8.5* 8.8*   GFR: Estimated Creatinine Clearance: 98 mL/min (by C-G formula based on SCr of 0.98 mg/dL). Liver Function Tests: Recent Labs  Lab 07/01/20 0707 07/02/20 0456 07/03/20 0605 07/04/20 0336 07/05/20 0517  AST 37 41 34 43* 42*  ALT 85* 92* 90* 98* 106*  ALKPHOS 55 62 50 58 61  BILITOT 1.0 1.0 0.8 1.1 1.3*  PROT 6.4* 6.2* 6.1* 6.3* 6.4*  ALBUMIN 3.2* 3.1* 3.1* 3.2* 3.3*    Imaging Studies   DG Chest Port 1 View  Result Date: 07/05/2020 CLINICAL DATA:  Pneumonia due to COVID-19. EXAM: PORTABLE CHEST 1 VIEW COMPARISON:  CT chest 06/28/2020.  Chest x-ray 06/25/2020. FINDINGS: Mediastinum hilar structures normal. Heart size stable.  Low lung volumes. Diffuse bilateral patchy pulmonary infiltrates are again noted without interim change. No pleural effusion or pneumothorax. No acute bony abnormality. Old right clavicular fracture. IMPRESSION: Low lung volumes. Diffuse bilateral patchy pulmonary infiltrates are again noted without interim change. Findings consistent with COVID pneumonia. Electronically Signed   By: [place]   On: 07/05/2020 05:19     Medications   Scheduled Meds: . acidophilus  2 capsule Oral TID  . aspirin EC  81 mg Oral Daily  . baricitinib  4 mg Oral Daily  . dextromethorphan-guaiFENesin  1 tablet Oral BID  . enoxaparin (LOVENOX) injection  0.5 mg/kg Subcutaneous Q24H  . feeding supplement  237 mL Oral TID BM  . influenza vac split quadrivalent PF  0.5 mL Intramuscular Tomorrow-1000  . Ipratropium-Albuterol  1 puff Inhalation Q6H  . methylPREDNISolone (SOLU-MEDROL) injection  100 mg Intravenous Q12H  . mometasone-formoterol  2 puff Inhalation BID  . [START ON 07/06/2020] multivitamin with minerals  1 tablet Oral Daily  . pantoprazole  40 mg Oral Daily  . pneumococcal 23 valent vaccine  0.5 mL Intramuscular Tomorrow-1000   Continuous Infusions:      LOS: 10 days    Bonnielee Haff,  [facility]  07/05/2020, 10:07 AM    If 7PM-7AM, please contact night-coverage. How to contact the [facility] Attending or Consulting provider [place] or covering provider during after hours [place], for this patient?    1. Check the care team in [facility] and look for a) attending/consulting TRH provider listed and b) the [facility] team listed 2. Log into [email or web address] and use [place]'s universal password to access. If you do not have the password, please contact the hospital operator. 3. Locate the [facility] provider you are looking for  under [facility] and page to a number that you can be directly reached. 4. If you still have difficulty reaching the provider, please page the [facility] (Director on Call) for the  Hospitalists listed on amion for assistance.

## 2020-07-05 NOTE — Progress Notes (Signed)
Initial Nutrition Assessment  DOCUMENTATION CODES:   Obesity unspecified  INTERVENTION:   Ensure Enlive po TID, each supplement provides 350 kcal and 20 grams of protein  MVI daily   NUTRITION DIAGNOSIS:   Increased nutrient needs related to catabolic illness (COPD, COVID 19) as evidenced by increased estimated needs.  GOAL:   Patient will meet greater than or equal to 90% of their needs  MONITOR:   PO intake, Supplement acceptance, Labs, Weight trends, Skin, I & O's  REASON FOR ASSESSMENT:   LOS    ASSESSMENT:   59 y.o. male with medical history significant for COPD, mild persistent asthma and GERD who is admitted with COVID 15   Spoke with pt via phone. Pt reports good appetite and oral intake at baseline and in hospital; pt eating 100% of meals. Pt reports weight loss in hospital. 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 Per chart, pt is down 22lbs(9%) in < 1 month; this is significant. RD discussed with pt the importance of adequate nutrition needed to preserve lean muscle in relation to COVID 19. Pt is willing to drink vanilla supplements. RD will add supplements and MVI to help pt meet his estimated needs. RD will monitor weights.   Medications reviewed and include: risaquad, aspirin, lovenox, protonix  Labs reviewed: Na 131(L), BUN 26(H), AST 42(H), tbili 1.3(H) Wbc- 18.4(H)  NUTRITION - FOCUSED PHYSICAL EXAM: Unable to perform at this time   Diet Order:   Diet Order            Diet regular Room service appropriate? Yes; Fluid consistency: Thin  Diet effective now                EDUCATION NEEDS:   Education needs have been addressed  Skin:  Skin Assessment: Reviewed RN Assessment  Last BM:  10/28  Height:   Ht Readings from Last 1 Encounters:  06/27/20 5\' 8"  (1.727 m)    Weight:   Wt Readings from Last 1 Encounters:  07/05/20 103.2 kg    Ideal Body Weight:  70 kg  BMI:  Body mass index is 34.61 kg/m.  Estimated  Nutritional Needs:   Kcal:  2300-2600kcal/day  Protein:  115-130g/day  Fluid:  2.1-2.4L/day  Koleen Distance MS, RD, LDN Please refer to [facility] for RD and/or RD on-call/weekend/after hours pager

## 2020-07-06 DIAGNOSIS — U071 COVID-19: Secondary | ICD-10-CM | POA: Diagnosis not present

## 2020-07-06 DIAGNOSIS — J1282 Pneumonia due to coronavirus disease 2019: Secondary | ICD-10-CM | POA: Diagnosis not present

## 2020-07-06 DIAGNOSIS — J9601 Acute respiratory failure with hypoxia: Secondary | ICD-10-CM | POA: Diagnosis not present

## 2020-07-06 DIAGNOSIS — E871 Hypo-osmolality and hyponatremia: Secondary | ICD-10-CM | POA: Diagnosis not present

## 2020-07-06 LAB — COMPREHENSIVE METABOLIC PANEL
ALT: 143 U/L — ABNORMAL HIGH (ref 0–44)
AST: 54 U/L — ABNORMAL HIGH (ref 15–41)
Albumin: 3.2 g/dL — ABNORMAL LOW (ref 3.5–5.0)
Alkaline Phosphatase: 61 U/L (ref 38–126)
Anion gap: 10 (ref 5–15)
BUN: 29 mg/dL — ABNORMAL HIGH (ref 6–20)
CO2: 26 mmol/L (ref 22–32)
Calcium: 8.9 mg/dL (ref 8.9–10.3)
Chloride: 94 mmol/L — ABNORMAL LOW (ref 98–111)
Creatinine, Ser: 0.93 mg/dL (ref 0.61–1.24)
GFR, Estimated: >60 mL/min (ref >60)
Glucose, Bld: 137 mg/dL — ABNORMAL HIGH (ref 70–99)
Potassium: 4.8 mmol/L (ref 3.5–5.1)
Sodium: 130 mmol/L — ABNORMAL LOW (ref 135–145)
Total Bilirubin: 1 mg/dL (ref 0.3–1.2)
Total Protein: 6.1 g/dL — ABNORMAL LOW (ref 6.5–8.1)

## 2020-07-06 LAB — MAGNESIUM: Magnesium: 2.6 mg/dL — ABNORMAL HIGH (ref 1.7–2.4)

## 2020-07-06 MED ORDER — FUROSEMIDE 10 MG/ML IJ SOLN
40.0000 mg | Freq: Once | INTRAMUSCULAR | Status: AC
  Administered 2020-07-06: 40 mg via INTRAVENOUS
  Filled 2020-07-06: qty 4

## 2020-07-06 MED ORDER — METHYLPREDNISOLONE SODIUM SUCC 125 MG IJ SOLR
60.0000 mg | Freq: Two times a day (BID) | INTRAMUSCULAR | Status: DC
  Administered 2020-07-06 – 2020-07-09 (×6): 60 mg via INTRAVENOUS
  Filled 2020-07-06 (×6): qty 2

## 2020-07-06 NOTE — Progress Notes (Signed)
PROGRESS NOTE    Derek Cook   WUJ:[id number]  DOB: 14-May-1961  PCP: Olin Hauser, DO    DOA: 06/25/2020 LOS: 11   Brief Narrative   Derek Cook is a 59 y.o. male with medical history significant for COPD, mild persistent asthma, GERD, who presented to the ED on 06/25/2020 with 1 week history of progressive shortness of breath, nonproductive cough, increased fatigue.  Recent travel to [place], no known sick Covid contacts, but is unvaccinated.  Admitted for acute respiratory failure with hypoxia due to severe Covid-19 pneumonia.     Assessment & Plan   Acute Hypoxic Resp. Failure/Pneumonia due to COVID-19   Recent Labs  Lab 06/30/20 0446 06/30/20 0447 07/01/20 0707 07/01/20 0707 07/02/20 0456 07/03/20 0605 07/04/20 0336 07/05/20 0517 07/06/20 0751  CRP 0.6  --  0.5  --  1.7* 0.5  --   --   --   ALT  --    < > 85*   < > 92* 90* 98* 106* 143*   < > = values in this interval not displayed.   D Dimer at [facility] ng/ml: 7500--5539--3263--2335--1997--1462--1263--1085  Objective findings: Oxygen requirements: Patient continues to require heated high flow at 40 L/min.  Saturations noted to be in the early 18s.     COVID 19 Therapeutics: Antibacterials: None currently Remdesivir: Completed course of Remdesivir on 10/23 Steroids: Solu-Medrol 100 mg every 12 hours Diuretics: Given furosemide on 10/27, 10/28, 10/29.  We will repeat dose today. Inhaled Steroids: On Dulera Baricitinib: On baricitinib, initiated on 10/21 PUD Prophylaxis: Protonix DVT Prophylaxis:  Lovenox once a day  Patient seems to be stable from a respiratory standpoint though remains tenuous.  Still requiring a lot of oxygen.  Inflammatory markers have improved.  D-dimer noted to be trending down.  CT angiogram and lower extremity Dopplers did not show any venous thromboembolism.  Continue with the steroids that we will decrease the dose today.  Continue baricitinib.  Give additional dose of  furosemide.  Continue to mobilize.  Incentive spirometry.  Due to elevated procalcitonin patient was given a 5-day course of ceftriaxone and doxycycline.  The treatment plan and use of medications and known side effects were discussed with patient/family. Some of the medications used are based on case reports/anecdotal data.  All other medications being used in the management of COVID-19 based on limited study data.  Complete risks and long-term side effects are unknown, however in the best clinical judgment they seem to be of some benefit.  Patient/family wanted to proceed with treatment options provided.  Hyponatremia Remains stable.  Continue to monitor while he is getting furosemide.  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  Mild transaminitis Most likely due to COVID-19 and Remdesivir and baricitinib.  Continue to monitor.  Seems to be stable.  History of GERD Continue PPI  Hyperlipidemia Managed by lifestyle changes.  Obesity Estimated body mass index is 34.61 kg/m as calculated from the following:   Height as of this encounter: 5\' 8"  (1.727 m).   Weight as of this encounter: 103.2 kg.  DVT prophylaxis: Lovenox   Code Status: Full Code     Subjective 07/06/20    Patient states that he continues to feel well.  Does get short of breath with minimal exertion.  Denies any chest pain, nausea or vomiting  Disposition Plan & Communication   Status is: Inpatient  Remains inpatient appropriate because:IV treatments appropriate due to intensity of illness or inability to take  PO.  Patient has high oxygen requirement and remains on heated high flow, desats with exertion   Dispo: The patient is from: Home              Anticipated d/c is to: Home              Anticipated d/c date is: > 3 days              Patient currently is not medically stable to d/c.    Family Communication: No family at bedside.  Apparently his wife is also hospitalized due to Covid.      Brother Forney Kleinpeter (wife [name]) [phone].     Consults, Procedures, Significant Events   Consultants:   None  Procedures:   None  Antimicrobials:  Anti-infectives (From admission, onward)   Start     Dose/Rate Route Frequency Ordered Stop   06/28/20 1000  cefTRIAXone (ROCEPHIN) 2 g in sodium chloride 0.9 % 100 mL IVPB        2 g 200 mL/hr over 30 Minutes Intravenous Every 24 hours 06/27/20 1600 07/01/20 1232   06/27/20 1000  cefTRIAXone (ROCEPHIN) 1 g in sodium chloride 0.9 % 100 mL IVPB  Status:  Discontinued        1 g 200 mL/hr over 30 Minutes Intravenous Every 24 hours 06/27/20 0854 06/27/20 1600   06/27/20 1000  doxycycline (VIBRAMYCIN) 100 mg in sodium chloride 0.9 % 250 mL IVPB  Status:  Discontinued        100 mg 125 mL/hr over 120 Minutes Intravenous Every 12 hours 06/27/20 0855 06/27/20 0937   06/27/20 1000  doxycycline (VIBRA-TABS) tablet 100 mg        100 mg Oral Every 12 hours 06/27/20 0937 07/01/20 2348   06/26/20 1000  remdesivir 100 mg in sodium chloride 0.9 % 100 mL IVPB       "Followed by" Linked Group Details   100 mg 200 mL/hr over 30 Minutes Intravenous Daily 06/25/20 1603 06/29/20 1020   06/25/20 1700  remdesivir 200 mg in sodium chloride 0.9% 250 mL IVPB       "Followed by" Linked Group Details   200 mg 580 mL/hr over 30 Minutes Intravenous Once 06/25/20 1603 06/25/20 1815        Objective   Vitals:   07/05/20 2009 07/05/20 2040 07/06/20 0450 07/06/20 0806  BP:  118/69 125/65 123/67  Pulse:  100 85   Resp:  (!) 23 17   Temp:  98.1 F (36.7 C) 98.5 F (36.9 C) 98.4 F (36.9 C)  TempSrc:  Axillary Oral Oral  SpO2: 94% 90% 92% 91%  Weight:      Height:        Intake/Output Summary (Last 24 hours) at 07/06/2020 1048 Last data filed at 07/06/2020 0016 Gross per 24 hour  Intake --  Output 1400 ml  Net -1400 ml   Filed Weights   07/03/20 0159 07/04/20 0450 07/05/20 0600  Weight: 104.2 kg 103.9 kg 103.2 kg    Physical  Exam:  General appearance: Awake alert.  In no distress Resp: Normal effort at rest.  Coarse breath sounds with crackles bilateral bases.  No wheezing or rhonchi Cardio: S1-S2 is normal regular.  No S3-S4.  No rubs murmurs or bruit GI: Abdomen is soft.  Nontender nondistended.  Bowel sounds are present normal.  No masses organomegaly Extremities: No edema.  Full range of motion of lower extremities. Neurologic: Alert and oriented x3.  No focal  neurological deficits.      Labs   Data Reviewed: I have personally reviewed following labs and imaging studies  CBC: Recent Labs  Lab 07/02/20 0456 07/05/20 0517  WBC 14.3* 18.4*  HGB 14.9 15.2  HCT 44.3 44.5  MCV 83.7 83.8  PLT 590* 188*   Basic Metabolic Panel: Recent Labs  Lab 07/02/20 0456 07/03/20 0605 07/04/20 0336 07/05/20 0517 07/06/20 0751  NA 133* 132* 131* 131* 130*  K 4.5 4.4 4.6 4.9 4.8  CL 103 101 98 98 94*  CO2 21* 23 23 24 26   GLUCOSE 143* 114* 134* 143* 137*  BUN 24* 22* 27* 26* 29*  CREATININE 0.90 0.74 0.95 0.98 0.93  CALCIUM 8.5* 8.5* 8.5* 8.8* 8.9  MG  --   --   --   --  2.6*   GFR: Estimated Creatinine Clearance: 103.2 mL/min (by C-G formula based on SCr of 0.93 mg/dL). Liver Function Tests: Recent Labs  Lab 07/02/20 0456 07/03/20 0605 07/04/20 0336 07/05/20 0517 07/06/20 0751  AST 41 34 43* 42* 54*  ALT 92* 90* 98* 106* 143*  ALKPHOS 62 50 58 61 61  BILITOT 1.0 0.8 1.1 1.3* 1.0  PROT 6.2* 6.1* 6.3* 6.4* 6.1*  ALBUMIN 3.1* 3.1* 3.2* 3.3* 3.2*    Imaging Studies   DG Chest Port 1 View  Result Date: 07/05/2020 CLINICAL DATA:  Pneumonia due to COVID-19. EXAM: PORTABLE CHEST 1 VIEW COMPARISON:  CT chest 06/28/2020.  Chest x-ray 06/25/2020. FINDINGS: Mediastinum hilar structures normal. Heart size stable. Low lung volumes. Diffuse bilateral patchy pulmonary infiltrates are again noted without interim change. No pleural effusion or pneumothorax. No acute bony abnormality. Old right clavicular  fracture. IMPRESSION: Low lung volumes. Diffuse bilateral patchy pulmonary infiltrates are again noted without interim change. Findings consistent with COVID pneumonia. Electronically Signed   By: [place]   On: 07/05/2020 05:19     Medications   Scheduled Meds: . acidophilus  2 capsule Oral TID  . aspirin EC  81 mg Oral Daily  . baricitinib  4 mg Oral Daily  . dextromethorphan-guaiFENesin  1 tablet Oral BID  . enoxaparin (LOVENOX) injection  0.5 mg/kg Subcutaneous Q24H  . feeding supplement  237 mL Oral TID BM  . influenza vac split quadrivalent PF  0.5 mL Intramuscular Tomorrow-1000  . Ipratropium-Albuterol  1 puff Inhalation Q6H  . methylPREDNISolone (SOLU-MEDROL) injection  100 mg Intravenous Q12H  . mometasone-formoterol  2 puff Inhalation BID  . multivitamin with minerals  1 tablet Oral Daily  . pantoprazole  40 mg Oral Daily  . pneumococcal 23 valent vaccine  0.5 mL Intramuscular Tomorrow-1000   Continuous Infusions:      LOS: 11 days    Bonnielee Haff,  [facility]  07/06/2020, 10:48 AM    If 7PM-7AM, please contact night-coverage. How to contact the [facility] Attending or Consulting provider [place] or covering provider during after hours [place], for this patient?    1. Check the care team in [facility] and look for a) attending/consulting TRH provider listed and b) the [facility] team listed 2. Log into [email or web address] and use 's universal password to access. If you do not have the password, please contact the hospital operator. 3. Locate the [facility] provider you are looking for under [facility] and page to a number that you can be directly reached. 4. If you still have difficulty reaching the provider, please page the [facility] (Director on Call) for the Hospitalists listed on amion for  assistance.

## 2020-07-07 DIAGNOSIS — U071 COVID-19: Secondary | ICD-10-CM | POA: Diagnosis not present

## 2020-07-07 DIAGNOSIS — J9601 Acute respiratory failure with hypoxia: Secondary | ICD-10-CM | POA: Diagnosis not present

## 2020-07-07 DIAGNOSIS — J1282 Pneumonia due to coronavirus disease 2019: Secondary | ICD-10-CM | POA: Diagnosis not present

## 2020-07-07 DIAGNOSIS — E871 Hypo-osmolality and hyponatremia: Secondary | ICD-10-CM | POA: Diagnosis not present

## 2020-07-07 LAB — CBC
HCT: 43.8 % (ref 39.0–52.0)
Hemoglobin: 14.8 g/dL (ref 13.0–17.0)
MCH: 28.6 pg (ref 26.0–34.0)
MCHC: 33.8 g/dL (ref 30.0–36.0)
MCV: 84.6 fL (ref 80.0–100.0)
Platelets: 481 10*3/uL — ABNORMAL HIGH (ref 150–400)
RBC: 5.18 MIL/uL (ref 4.22–5.81)
RDW: 14.5 % (ref 11.5–15.5)
WBC: 18.4 10*3/uL — ABNORMAL HIGH (ref 4.0–10.5)
nRBC: 0 % (ref 0.0–0.2)

## 2020-07-07 LAB — COMPREHENSIVE METABOLIC PANEL
ALT: 179 U/L — ABNORMAL HIGH (ref 0–44)
AST: 73 U/L — ABNORMAL HIGH (ref 15–41)
Albumin: 3.1 g/dL — ABNORMAL LOW (ref 3.5–5.0)
Alkaline Phosphatase: 63 U/L (ref 38–126)
Anion gap: 12 (ref 5–15)
BUN: 33 mg/dL — ABNORMAL HIGH (ref 6–20)
CO2: 25 mmol/L (ref 22–32)
Calcium: 8.5 mg/dL — ABNORMAL LOW (ref 8.9–10.3)
Chloride: 96 mmol/L — ABNORMAL LOW (ref 98–111)
Creatinine, Ser: 1.09 mg/dL (ref 0.61–1.24)
GFR, Estimated: >60 mL/min (ref >60)
Glucose, Bld: 145 mg/dL — ABNORMAL HIGH (ref 70–99)
Potassium: 4.4 mmol/L (ref 3.5–5.1)
Sodium: 133 mmol/L — ABNORMAL LOW (ref 135–145)
Total Bilirubin: 1.1 mg/dL (ref 0.3–1.2)
Total Protein: 5.8 g/dL — ABNORMAL LOW (ref 6.5–8.1)

## 2020-07-07 MED ORDER — FUROSEMIDE 10 MG/ML IJ SOLN
40.0000 mg | Freq: Once | INTRAMUSCULAR | Status: AC
  Administered 2020-07-07: 40 mg via INTRAVENOUS
  Filled 2020-07-07: qty 4

## 2020-07-07 NOTE — Progress Notes (Signed)
PROGRESS NOTE    Derek Cook   VFI:[id number]  DOB: [birth year]  PCP: Olin Hauser, DO    DOA: 06/25/2020 LOS: 12   Brief Narrative   Derek Cook is a 59 y.o. male with medical history significant for COPD, mild persistent asthma, GERD, who presented to the ED on 06/25/2020 with 1 week history of progressive shortness of breath, nonproductive cough, increased fatigue.  Recent travel to [place], no known sick Covid contacts, but is unvaccinated.  Admitted for acute respiratory failure with hypoxia due to severe Covid-19 pneumonia.     Assessment & Plan   Acute Hypoxic Resp. Failure/Pneumonia due to COVID-19   Recent Labs  Lab 07/01/20 0707 07/01/20 0707 07/02/20 0456 07/02/20 0456 07/03/20 0605 07/04/20 0336 07/05/20 0517 07/06/20 0751 07/07/20 0440  CRP 0.5  --  1.7*  --  0.5  --   --   --   --   ALT 85*   < > 92*   < > 90* 98* 106* 143* 179*   < > = values in this interval not displayed.   D Dimer at [facility] ng/ml: [phone]--[phone]--3263--2335--1997--1462--1263--1085  Objective findings: Oxygen requirements: Patient continues to require heated high flow at 40 L/min.  Saturations noted to be in the early 56s.  Nursing staff to continue to wean down to keep saturations 88% or higher.     COVID 19 Therapeutics: Antibacterials: None currently Remdesivir: Completed course of Remdesivir on 10/23 Steroids: Solu-Medrol 60 mg every 12 hours Diuretics: Given furosemide on 10/27, 10/28, 10/29, 10/30.  We will repeat dose today. Inhaled Steroids: On Dulera Baricitinib: On baricitinib, initiated on 10/21 PUD Prophylaxis: Protonix DVT Prophylaxis:  Lovenox once a day  Patient seems to be stable from a respiratory standpoint.  Still requiring a lot of oxygen.  Inflammatory markers have improved.  D-dimer is also trending downwards.  CT angiogram of the chest as well as lower extremity Doppler studies did not show any venous thromboembolism.  Patient has completed course of  Remdesivir.  Steroids being tapered gradually.  We will repeat dose of furosemide today.  Continue incentive spirometry mobilization.  Continue baricitinib.  Due to elevated procalcitonin patient was given a 5-day course of ceftriaxone and doxycycline.  The treatment plan and use of medications and known side effects were discussed with patient/family. Some of the medications used are based on case reports/anecdotal data.  All other medications being used in the management of COVID-19 based on limited study data.  Complete risks and long-term side effects are unknown, however in the best clinical judgment they seem to be of some benefit.  Patient/family wanted to proceed with treatment options provided.  Hyponatremia Remains stable.  Continue to monitor while he is getting furosemide.  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  Mild transaminitis Most likely due to COVID-19 and Remdesivir and baricitinib.  AST ALT have been trending upward for the last 2 days.  Recheck tomorrow.  May need to stop the baricitinib.    History of GERD Continue PPI  Hyperlipidemia Managed by lifestyle changes.  Obesity Estimated body mass index is 34.61 kg/m as calculated from the following:   Height as of this encounter: 5\' 8"  (1.727 m).   Weight as of this encounter: 103.2 kg.  DVT prophylaxis: Lovenox   Code Status: Full Code     Subjective 07/07/20    Patient denies any new complaints.  Continues to have shortness of breath with minimal exertion though he is able  to tolerate more activity than he was previously.  No chest pain.  Disposition Plan & Communication   Status is: Inpatient  Remains inpatient appropriate because:IV treatments appropriate due to intensity of illness or inability to take PO.  Patient has high oxygen requirement and remains on heated high flow, desats with exertion   Dispo: The patient is from: Home              Anticipated d/c is to: Home               Anticipated d/c date is: > 3 days              Patient currently is not medically stable to d/c.    Family Communication: No family at bedside.  Wife was recently discharged from the hospital.    Brother Teagen Mcleary (wife [name]) [phone].     Consults, Procedures, Significant Events   Consultants:   None  Procedures:   None  Antimicrobials:  Anti-infectives (From admission, onward)   Start     Dose/Rate Route Frequency Ordered Stop   06/28/20 1000  cefTRIAXone (ROCEPHIN) 2 g in sodium chloride 0.9 % 100 mL IVPB        2 g 200 mL/hr over 30 Minutes Intravenous Every 24 hours 06/27/20 1600 07/01/20 1232   06/27/20 1000  cefTRIAXone (ROCEPHIN) 1 g in sodium chloride 0.9 % 100 mL IVPB  Status:  Discontinued        1 g 200 mL/hr over 30 Minutes Intravenous Every 24 hours 06/27/20 0854 06/27/20 1600   06/27/20 1000  doxycycline (VIBRAMYCIN) 100 mg in sodium chloride 0.9 % 250 mL IVPB  Status:  Discontinued        100 mg 125 mL/hr over 120 Minutes Intravenous Every 12 hours 06/27/20 0855 06/27/20 0937   06/27/20 1000  doxycycline (VIBRA-TABS) tablet 100 mg        100 mg Oral Every 12 hours 06/27/20 0937 07/01/20 2348   06/26/20 1000  remdesivir 100 mg in sodium chloride 0.9 % 100 mL IVPB       "Followed by" Linked Group Details   100 mg 200 mL/hr over 30 Minutes Intravenous Daily 06/25/20 1603 06/29/20 1020   06/25/20 1700  remdesivir 200 mg in sodium chloride 0.9% 250 mL IVPB       "Followed by" Linked Group Details   200 mg 580 mL/hr over 30 Minutes Intravenous Once 06/25/20 1603 06/25/20 1815        Objective   Vitals:   07/07/20 0318 07/07/20 0500 07/07/20 0515 07/07/20 0600  BP:   (!) 118/59   Pulse:  74 69 73  Resp:  17 18 18   Temp:   98.4 F (36.9 C)   TempSrc:   Axillary   SpO2: 94% 95% 93% 94%  Weight:      Height:        Intake/Output Summary (Last 24 hours) at 07/07/2020 1100 Last data filed at 07/06/2020 1354 Gross per 24 hour  Intake --   Output 450 ml  Net -450 ml   Filed Weights   07/03/20 0159 07/04/20 0450 07/05/20 0600  Weight: 104.2 kg 103.9 kg 103.2 kg    Physical Exam:  General appearance: Awake alert.  In no distress Resp: Normal effort at rest.  Coarse breath sounds with a few crackles at the bases.  No wheezing or rhonchi. Cardio: S1-S2 is normal regular.  No S3-S4.  No rubs murmurs or bruit GI: Abdomen is soft.  Nontender nondistended.  Bowel sounds are present normal.  No masses organomegaly Extremities: No edema.  Full range of motion of lower extremities. Neurologic: Alert and oriented x3.  No focal neurological deficits.      Labs   Data Reviewed: I have personally reviewed following labs and imaging studies  CBC: Recent Labs  Lab 07/02/20 0456 07/05/20 0517 07/07/20 0440  WBC 14.3* 18.4* 18.4*  HGB 14.9 15.2 14.8  HCT 44.3 44.5 43.8  MCV 83.7 83.8 84.6  PLT 590* 557* 397*   Basic Metabolic Panel: Recent Labs  Lab 07/03/20 0605 07/04/20 0336 07/05/20 0517 07/06/20 0751 07/07/20 0440  NA 132* 131* 131* 130* 133*  K 4.4 4.6 4.9 4.8 4.4  CL 101 98 98 94* 96*  CO2 23 23 24 26 25   GLUCOSE 114* 134* 143* 137* 145*  BUN 22* 27* 26* 29* 33*  CREATININE 0.74 0.95 0.98 0.93 1.09  CALCIUM 8.5* 8.5* 8.8* 8.9 8.5*  MG  --   --   --  2.6*  --    GFR: Estimated Creatinine Clearance: 88.1 mL/min (by C-G formula based on SCr of 1.09 mg/dL). Liver Function Tests: Recent Labs  Lab 07/03/20 0605 07/04/20 0336 07/05/20 0517 07/06/20 0751 07/07/20 0440  AST 34 43* 42* 54* 73*  ALT 90* 98* 106* 143* 179*  ALKPHOS 50 58 61 61 63  BILITOT 0.8 1.1 1.3* 1.0 1.1  PROT 6.1* 6.3* 6.4* 6.1* 5.8*  ALBUMIN 3.1* 3.2* 3.3* 3.2* 3.1*    Imaging Studies   No results found.   Medications   Scheduled Meds: . acidophilus  2 capsule Oral TID  . aspirin EC  81 mg Oral Daily  . baricitinib  4 mg Oral Daily  . dextromethorphan-guaiFENesin  1 tablet Oral BID  . enoxaparin (LOVENOX) injection   0.5 mg/kg Subcutaneous Q24H  . feeding supplement  237 mL Oral TID BM  . influenza vac split quadrivalent PF  0.5 mL Intramuscular Tomorrow-1000  . Ipratropium-Albuterol  1 puff Inhalation Q6H  . methylPREDNISolone (SOLU-MEDROL) injection  60 mg Intravenous Q12H  . mometasone-formoterol  2 puff Inhalation BID  . multivitamin with minerals  1 tablet Oral Daily  . pantoprazole  40 mg Oral Daily  . pneumococcal 23 valent vaccine  0.5 mL Intramuscular Tomorrow-1000   Continuous Infusions:      LOS: 12 days    Bonnielee Haff,  [facility]  07/07/2020, 11:00 AM    If 7PM-7AM, please contact night-coverage. How to contact the [facility] Attending or Consulting provider [place] or covering provider during after hours [place], for this patient?    1. Check the care team in [facility] and look for a) attending/consulting TRH provider listed and b) the [facility] team listed 2. Log into [email or web address] and use Laura's universal password to access. If you do not have the password, please contact the hospital operator. 3. Locate the [facility] provider you are looking for under [facility] and page to a number that you can be directly reached. 4. If you still have difficulty reaching the provider, please page the [facility] (Director on Call) for the Hospitalists listed on amion for assistance.

## 2020-07-08 DIAGNOSIS — E871 Hypo-osmolality and hyponatremia: Secondary | ICD-10-CM | POA: Diagnosis not present

## 2020-07-08 DIAGNOSIS — U071 COVID-19: Secondary | ICD-10-CM | POA: Diagnosis not present

## 2020-07-08 DIAGNOSIS — J9601 Acute respiratory failure with hypoxia: Secondary | ICD-10-CM | POA: Diagnosis not present

## 2020-07-08 DIAGNOSIS — J1282 Pneumonia due to coronavirus disease 2019: Secondary | ICD-10-CM | POA: Diagnosis not present

## 2020-07-08 LAB — COMPREHENSIVE METABOLIC PANEL
ALT: 281 U/L — ABNORMAL HIGH (ref 0–44)
AST: 108 U/L — ABNORMAL HIGH (ref 15–41)
Albumin: 3.2 g/dL — ABNORMAL LOW (ref 3.5–5.0)
Alkaline Phosphatase: 62 U/L (ref 38–126)
Anion gap: 12 (ref 5–15)
BUN: 33 mg/dL — ABNORMAL HIGH (ref 6–20)
CO2: 27 mmol/L (ref 22–32)
Calcium: 8.9 mg/dL (ref 8.9–10.3)
Chloride: 93 mmol/L — ABNORMAL LOW (ref 98–111)
Creatinine, Ser: 1.01 mg/dL (ref 0.61–1.24)
GFR, Estimated: >60 mL/min (ref >60)
Glucose, Bld: 132 mg/dL — ABNORMAL HIGH (ref 70–99)
Potassium: 4.6 mmol/L (ref 3.5–5.1)
Sodium: 132 mmol/L — ABNORMAL LOW (ref 135–145)
Total Bilirubin: 1.2 mg/dL (ref 0.3–1.2)
Total Protein: 6.1 g/dL — ABNORMAL LOW (ref 6.5–8.1)

## 2020-07-08 MED ORDER — FUROSEMIDE 10 MG/ML IJ SOLN
40.0000 mg | Freq: Once | INTRAMUSCULAR | Status: AC
  Administered 2020-07-08: 40 mg via INTRAVENOUS
  Filled 2020-07-08: qty 4

## 2020-07-08 NOTE — Progress Notes (Signed)
PROGRESS NOTE    Derek Cook   AYT:[id number]  DOB: 03-Dec-1960  PCP: Olin Hauser, DO    DOA: 06/25/2020 LOS: 13   Brief Narrative   Derek Cook is a 59 y.o. male with medical history significant for COPD, mild persistent asthma, GERD, who presented to the ED on 06/25/2020 with 1 week history of progressive shortness of breath, nonproductive cough, increased fatigue.  Recent travel to [place], no known sick Covid contacts, but is unvaccinated.  Admitted for acute respiratory failure with hypoxia due to severe Covid-19 pneumonia.     Assessment & Plan   Acute Hypoxic Resp. Failure/Pneumonia due to COVID-19   Recent Labs  Lab 07/02/20 0456 07/02/20 0456 07/03/20 0605 07/03/20 0605 07/04/20 0336 07/05/20 0517 07/06/20 0751 07/07/20 0440 07/08/20 0428  CRP 1.7*  --  0.5  --   --   --   --   --   --   ALT 92*   < > 90*   < > 98* 106* 143* 179* 281*   < > = values in this interval not displayed.   D Dimer at [facility] ng/ml: 7500--5539--3263--2335--1997--1462--1263--1085  Objective findings: Oxygen requirements: Patient continues to require heated high flow.  Now down to 40 L/min.  Was decreased down to 38.  Saturating in the late 80s to early 90s.    COVID 19 Therapeutics: Antibacterials: None currently Remdesivir: Completed course of Remdesivir on 10/23 Steroids: Solu-Medrol 60 mg every 12 hours Diuretics: Given furosemide on 10/27, 10/28, 10/29, 10/30, 10/31.  We will repeat another dose today.  Noted that his BUN is climbing gradually. Inhaled Steroids: On Dulera Baricitinib: On baricitinib, initiated on 10/21.  We will stop today due to increase in LFTs. PUD Prophylaxis: Protonix DVT Prophylaxis:  Lovenox once a day  Patient seems to be stable from a respiratory standpoint.  Still requiring a lot of oxygen which is being slowly weaned.  Continue Solu-Medrol.  He has completed course of Remdesivir.  We will stop baricitinib today due to rise in his liver  function test.  Give him another dose of furosemide today.  Leukocytosis is due to steroids.  Recheck tomorrow morning.  Chest x-ray tomorrow morning.  D-dimer had been trending downwards.  CT angiogram of the chest and lower extremity Doppler studies did not show any DVT.  Continue with incentive spirometry mobilization.  Due to elevated procalcitonin patient was given a 5-day course of ceftriaxone and doxycycline.  The treatment plan and use of medications and known side effects were discussed with patient/family. Some of the medications used are based on case reports/anecdotal data.  All other medications being used in the management of COVID-19 based on limited study data.  Complete risks and long-term side effects are unknown, however in the best clinical judgment they seem to be of some benefit.  Patient/family wanted to proceed with treatment options provided.  Hyponatremia Remains stable.  Continue to monitor while he is getting furosemide.  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  Mild transaminitis Most likely due to COVID-19 and Remdesivir and baricitinib.  AST ALT have been trending up the last few days.  Likely due to baricitinib which will be discontinued today.  Patient does not have any right upper quadrant tenderness.  If there is no improvement then may have to do hepatobiliary ultrasound.  Alkaline phosphatase and bilirubin are normal.    History of GERD Continue PPI  Hyperlipidemia Managed by lifestyle changes.  Obesity Estimated body mass  index is 34.61 kg/m as calculated from the following:   Height as of this encounter: 5\' 8"  (1.727 m).   Weight as of this encounter: 103.2 kg.  DVT prophylaxis: Lovenox   Code Status: Full Code     Subjective 07/08/20    Patient complains of some discomfort in his upper abdomen last night.  Denies any discomfort this morning.  No nausea vomiting.  Urinating well.  Had 2 bowel movements yesterday.  Denies loose  stools.    Disposition Plan & Communication   Status is: Inpatient  Remains inpatient appropriate because:IV treatments appropriate due to intensity of illness or inability to take PO.  Patient has high oxygen requirement and remains on heated high flow, desats with exertion   Dispo: The patient is from: Home              Anticipated d/c is to: Home              Anticipated d/c date is: > 3 days              Patient currently is not medically stable to d/c.    Family Communication: No family at bedside.  Wife was recently discharged from the hospital.    Brother Blade Scheff (wife [name]) [phone].     Consults, Procedures, Significant Events   Consultants:   None  Procedures:   None  Antimicrobials:  Anti-infectives (From admission, onward)   Start     Dose/Rate Route Frequency Ordered Stop   06/28/20 1000  cefTRIAXone (ROCEPHIN) 2 g in sodium chloride 0.9 % 100 mL IVPB        2 g 200 mL/hr over 30 Minutes Intravenous Every 24 hours 06/27/20 1600 07/01/20 1232   06/27/20 1000  cefTRIAXone (ROCEPHIN) 1 g in sodium chloride 0.9 % 100 mL IVPB  Status:  Discontinued        1 g 200 mL/hr over 30 Minutes Intravenous Every 24 hours 06/27/20 0854 06/27/20 1600   06/27/20 1000  doxycycline (VIBRAMYCIN) 100 mg in sodium chloride 0.9 % 250 mL IVPB  Status:  Discontinued        100 mg 125 mL/hr over 120 Minutes Intravenous Every 12 hours 06/27/20 0855 06/27/20 0937   06/27/20 1000  doxycycline (VIBRA-TABS) tablet 100 mg        100 mg Oral Every 12 hours 06/27/20 0937 07/01/20 2348   06/26/20 1000  remdesivir 100 mg in sodium chloride 0.9 % 100 mL IVPB       "Followed by" Linked Group Details   100 mg 200 mL/hr over 30 Minutes Intravenous Daily 06/25/20 1603 06/29/20 1020   06/25/20 1700  remdesivir 200 mg in sodium chloride 0.9% 250 mL IVPB       "Followed by" Linked Group Details   200 mg 580 mL/hr over 30 Minutes Intravenous Once 06/25/20 1603 06/25/20 1815         Objective   Vitals:   07/07/20 1100 07/07/20 1658 07/08/20 0253 07/08/20 0841  BP: 123/75 132/75  131/65  Pulse: 96     Resp: 20     Temp: 98.4 F (36.9 C) 98.6 F (37 C)  98 F (36.7 C)  TempSrc: Oral Oral    SpO2: 92%  91%   Weight:      Height:        Intake/Output Summary (Last 24 hours) at 07/08/2020 0948 Last data filed at 07/08/2020 0839 Gross per 24 hour  Intake --  Output 600 ml  Net -600 ml   Filed Weights   07/03/20 0159 07/04/20 0450 07/05/20 0600  Weight: 104.2 kg 103.9 kg 103.2 kg    Physical Exam:  General appearance: Awake alert.  In no distress Resp: Normal effort at rest.  Coarse breath sounds bilaterally with few crackles at the bases.  No wheezing or rhonchi. Cardio: S1-S2 is normal regular.  No S3-S4.  No rubs murmurs or bruit GI: Abdomen is soft.  Nontender nondistended.  Bowel sounds are present normal.  No masses organomegaly Extremities: No edema.  Full range of motion of lower extremities. Neurologic: Alert and oriented x3.  No focal neurological deficits.      Labs   Data Reviewed: I have personally reviewed following labs and imaging studies  CBC: Recent Labs  Lab 07/02/20 0456 07/05/20 0517 07/07/20 0440  WBC 14.3* 18.4* 18.4*  HGB 14.9 15.2 14.8  HCT 44.3 44.5 43.8  MCV 83.7 83.8 84.6  PLT 590* 557* 283*   Basic Metabolic Panel: Recent Labs  Lab 07/04/20 0336 07/05/20 0517 07/06/20 0751 07/07/20 0440 07/08/20 0428  NA 131* 131* 130* 133* 132*  K 4.6 4.9 4.8 4.4 4.6  CL 98 98 94* 96* 93*  CO2 23 24 26 25 27   GLUCOSE 134* 143* 137* 145* 132*  BUN 27* 26* 29* 33* 33*  CREATININE 0.95 0.98 0.93 1.09 1.01  CALCIUM 8.5* 8.8* 8.9 8.5* 8.9  MG  --   --  2.6*  --   --    GFR: Estimated Creatinine Clearance: 95.1 mL/min (by C-G formula based on SCr of 1.01 mg/dL). Liver Function Tests: Recent Labs  Lab 07/04/20 0336 07/05/20 0517 07/06/20 0751 07/07/20 0440 07/08/20 0428  AST 43* 42* 54* 73* 108*  ALT 98*  106* 143* 179* 281*  ALKPHOS 58 61 61 63 62  BILITOT 1.1 1.3* 1.0 1.1 1.2  PROT 6.3* 6.4* 6.1* 5.8* 6.1*  ALBUMIN 3.2* 3.3* 3.2* 3.1* 3.2*    Imaging Studies   No results found.   Medications   Scheduled Meds:  acidophilus  2 capsule Oral TID   aspirin EC  81 mg Oral Daily   dextromethorphan-guaiFENesin  1 tablet Oral BID   enoxaparin (LOVENOX) injection  0.5 mg/kg Subcutaneous Q24H   feeding supplement  237 mL Oral TID BM   influenza vac split quadrivalent PF  0.5 mL Intramuscular Tomorrow-1000   Ipratropium-Albuterol  1 puff Inhalation Q6H   methylPREDNISolone (SOLU-MEDROL) injection  60 mg Intravenous Q12H   mometasone-formoterol  2 puff Inhalation BID   multivitamin with minerals  1 tablet Oral Daily   pantoprazole  40 mg Oral Daily   pneumococcal 23 valent vaccine  0.5 mL Intramuscular Tomorrow-1000   Continuous Infusions:      LOS: 13 days    Bonnielee Haff,  [facility]  07/08/2020, 9:48 AM    If 7PM-7AM, please contact night-coverage. How to contact the [facility] Attending or Consulting provider [place] or covering provider during after hours [place], for this patient?    1. Check the care team in [facility] and look for a) attending/consulting TRH provider listed and b) the [facility] team listed 2. Log into [email or web address] and use [place]'s universal password to access. If you do not have the password, please contact the hospital operator. 3. Locate the [facility] provider you are looking for under [facility] and page to a number that you can be directly reached. 4. If you still have difficulty reaching the provider, please page the [facility] (  Director on Call) for the Hospitalists listed on amion for assistance.

## 2020-07-09 ENCOUNTER — Inpatient Hospital Stay: Payer: 59

## 2020-07-09 DIAGNOSIS — R7401 Elevation of levels of liver transaminase levels: Secondary | ICD-10-CM | POA: Diagnosis not present

## 2020-07-09 DIAGNOSIS — E871 Hypo-osmolality and hyponatremia: Secondary | ICD-10-CM | POA: Diagnosis not present

## 2020-07-09 DIAGNOSIS — J9601 Acute respiratory failure with hypoxia: Secondary | ICD-10-CM | POA: Diagnosis not present

## 2020-07-09 DIAGNOSIS — U071 COVID-19: Secondary | ICD-10-CM | POA: Diagnosis not present

## 2020-07-09 LAB — CBC WITH DIFFERENTIAL/PLATELET
Abs Immature Granulocytes: 0.83 10*3/uL — ABNORMAL HIGH (ref 0.00–0.07)
Basophils Absolute: 0 10*3/uL (ref 0.0–0.1)
Basophils Relative: 0 %
Eosinophils Absolute: 0 10*3/uL (ref 0.0–0.5)
Eosinophils Relative: 0 %
HCT: 43 % (ref 39.0–52.0)
Hemoglobin: 14.5 g/dL (ref 13.0–17.0)
Immature Granulocytes: 5 %
Lymphocytes Relative: 6 %
Lymphs Abs: 1 10*3/uL (ref 0.7–4.0)
MCH: 28.8 pg (ref 26.0–34.0)
MCHC: 33.7 g/dL (ref 30.0–36.0)
MCV: 85.5 fL (ref 80.0–100.0)
Monocytes Absolute: 1.8 10*3/uL — ABNORMAL HIGH (ref 0.1–1.0)
Monocytes Relative: 10 %
Neutro Abs: 14 10*3/uL — ABNORMAL HIGH (ref 1.7–7.7)
Neutrophils Relative %: 79 %
Platelets: 351 10*3/uL (ref 150–400)
RBC: 5.03 MIL/uL (ref 4.22–5.81)
RDW: 14.8 % (ref 11.5–15.5)
WBC: 17.7 10*3/uL — ABNORMAL HIGH (ref 4.0–10.5)
nRBC: 0 % (ref 0.0–0.2)

## 2020-07-09 LAB — COMPREHENSIVE METABOLIC PANEL
ALT: 495 U/L — ABNORMAL HIGH (ref 0–44)
AST: 188 U/L — ABNORMAL HIGH (ref 15–41)
Albumin: 3.2 g/dL — ABNORMAL LOW (ref 3.5–5.0)
Alkaline Phosphatase: 72 U/L (ref 38–126)
Anion gap: 12 (ref 5–15)
BUN: 31 mg/dL — ABNORMAL HIGH (ref 6–20)
CO2: 27 mmol/L (ref 22–32)
Calcium: 8.4 mg/dL — ABNORMAL LOW (ref 8.9–10.3)
Chloride: 91 mmol/L — ABNORMAL LOW (ref 98–111)
Creatinine, Ser: 0.94 mg/dL (ref 0.61–1.24)
GFR, Estimated: >60 mL/min (ref >60)
Glucose, Bld: 117 mg/dL — ABNORMAL HIGH (ref 70–99)
Potassium: 4.6 mmol/L (ref 3.5–5.1)
Sodium: 130 mmol/L — ABNORMAL LOW (ref 135–145)
Total Bilirubin: 1.2 mg/dL (ref 0.3–1.2)
Total Protein: 5.9 g/dL — ABNORMAL LOW (ref 6.5–8.1)

## 2020-07-09 MED ORDER — METHYLPREDNISOLONE SODIUM SUCC 40 MG IJ SOLR
40.0000 mg | Freq: Two times a day (BID) | INTRAMUSCULAR | Status: DC
  Administered 2020-07-09 – 2020-07-14 (×10): 40 mg via INTRAVENOUS
  Filled 2020-07-09 (×11): qty 1

## 2020-07-09 MED ORDER — FUROSEMIDE 10 MG/ML IJ SOLN
40.0000 mg | Freq: Once | INTRAMUSCULAR | Status: AC
  Administered 2020-07-09: 40 mg via INTRAVENOUS
  Filled 2020-07-09: qty 4

## 2020-07-09 NOTE — Progress Notes (Addendum)
PROGRESS NOTE    Derek Cook   WUJ:[id number]  DOB: 1961/03/14  PCP: Olin Hauser, DO    DOA: 06/25/2020 LOS: 63   Brief Narrative   Derek Cook is a 59 y.o. male with medical history significant for COPD, mild persistent asthma, GERD, who presented to the ED on 06/25/2020 with 1 week history of progressive shortness of breath, nonproductive cough, increased fatigue.  Recent travel to [place], no known sick Covid contacts, but is unvaccinated.  Admitted for acute respiratory failure with hypoxia due to severe Covid-19 pneumonia.     Assessment & Plan   Acute Hypoxic Resp. Failure/Pneumonia due to COVID-19   Recent Labs  Lab 07/03/20 0605 07/04/20 0336 07/05/20 0517 07/06/20 0751 07/07/20 0440 07/08/20 0428 07/09/20 0635  CRP 0.5  --   --   --   --   --   --   ALT 90*   < > 106* 143* 179* 281* 495*   < > = values in this interval not displayed.   D [facility]imer at [facility] ng/ml: [phone]--[phone]--[phone]--2335--[phone]--[phone]  Objective findings: Oxygen requirements: Is now on high flow nasal cannula 28 l/min saturating in the early 90s.    COVID 19 Therapeutics: Antibacterials: None currently Remdesivir: Completed course of Remdesivir on 10/23 Steroids: Solu-Medrol 60 mg every 12 hours we will decrease today to 40 mg twice a day Diuretics: Given furosemide on 10/27, 10/28, 10/29, 10/30, 10/31, 11/1.  Repeat another dose today Inhaled Steroids: On Dulera Baricitinib: On baricitinib, initiated on 10/21.  Discontinued on 11/1 due to rising LFTs. PUD Prophylaxis: Protonix DVT Prophylaxis:  Lovenox once a day  From a respiratory standpoint patient seems to be making some progress.  His oxygen requirements have come down to 28 l.  He feels better.  He has completed course of Remdesivir.  Start tapering steroids.  His inflammatory markers improved.  D-dimer is also improving.  Give another dose of furosemide today.  He has been diuresing well.  Chest x-ray showed stable  findings.  [place] ptosis is due to steroids.  D-dimer had been trending downwards.  CT angiogram of the chest and lower extremity Doppler studies did not show any DVT.  Continue with incentive spirometry mobilization.  Due to elevated procalcitonin patient was given a 5-day course of ceftriaxone and doxycycline.  The treatment plan and use of medications and known side effects were discussed with patient/family. Some of the medications used are based on case reports/anecdotal data.  All other medications being used in the management of COVID-19 based on limited study data.  Complete risks and long-term side effects are unknown, however in the best clinical judgment they seem to be of some benefit.  Patient/family wanted to proceed with treatment options provided.  Hyponatremia Remains stable.  Continue to monitor while he is getting furosemide.  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  Mild transaminitis Most likely due to COVID-19 and Remdesivir and baricitinib.   Over the last 48 hours his AST and ALT have climbed.  Bilirubin and alkaline phosphatase is normal.  Baricitinib was discontinued yesterday.  Patient denies any right upper quadrant symptoms.  Will do right upper quadrant ultrasound.  Check hepatitis panel.  Not noted to be on any other hepatotoxic medications.  We will hold his Tylenol.  History of GERD Continue PPI  Hyperlipidemia Managed by lifestyle changes.  Obesity Estimated body mass index is 34.88 kg/m as calculated from the following:   Height as of this encounter: 5'  8" (1.727 m).   Weight as of this encounter: 104.1 kg.  DVT prophylaxis: Lovenox   Code Status: Full Code     Subjective 07/09/20    Patient denies any chest pain.  Shortness of breath is improving.  No right upper quadrant abdominal pain.  No nausea vomiting.  Urinating well.  Disposition Plan & Communication   Status is: Inpatient  Remains inpatient appropriate because:IV  treatments appropriate due to intensity of illness or inability to take PO.  Patient has high oxygen requirement and remains on heated high flow, desats with exertion   Dispo: The patient is from: Home              Anticipated d/c is to: Home              Anticipated d/c date is: > 3 days              Patient currently is not medically stable to d/c.    Family Communication: No family at bedside.  Wife was recently discharged from the hospital.    Brother Derek Cook (wife [name]) [phone].     Consults, Procedures, Significant Events   Consultants:   None  Procedures:   None  Antimicrobials:  Anti-infectives (From admission, onward)   Start     Dose/Rate Route Frequency Ordered Stop   06/28/20 1000  cefTRIAXone (ROCEPHIN) 2 g in sodium chloride 0.9 % 100 mL IVPB        2 g 200 mL/hr over 30 Minutes Intravenous Every 24 hours 06/27/20 1600 07/01/20 1232   06/27/20 1000  cefTRIAXone (ROCEPHIN) 1 g in sodium chloride 0.9 % 100 mL IVPB  Status:  Discontinued        1 g 200 mL/hr over 30 Minutes Intravenous Every 24 hours 06/27/20 0854 06/27/20 1600   06/27/20 1000  doxycycline (VIBRAMYCIN) 100 mg in sodium chloride 0.9 % 250 mL IVPB  Status:  Discontinued        100 mg 125 mL/hr over 120 Minutes Intravenous Every 12 hours 06/27/20 0855 06/27/20 0937   06/27/20 1000  doxycycline (VIBRA-TABS) tablet 100 mg        100 mg Oral Every 12 hours 06/27/20 0937 07/01/20 2348   06/26/20 1000  remdesivir 100 mg in sodium chloride 0.9 % 100 mL IVPB       "Followed by" Linked Group Details   100 mg 200 mL/hr over 30 Minutes Intravenous Daily 06/25/20 1603 06/29/20 1020   06/25/20 1700  remdesivir 200 mg in sodium chloride 0.9% 250 mL IVPB       "Followed by" Linked Group Details   200 mg 580 mL/hr over 30 Minutes Intravenous Once 06/25/20 1603 06/25/20 1815        Objective   Vitals:   07/09/20 0549 07/09/20 0550 07/09/20 0810 07/09/20 0837  BP:  131/66  (!) 148/82  Pulse:   99    Resp:  19    Temp:    98.1 F (36.7 C)  TempSrc:  Oral  Oral  SpO2:  95% 93% 93%  Weight: 104.1 kg     Height:        Intake/Output Summary (Last 24 hours) at 07/09/2020 1029 Last data filed at 07/09/2020 0659 Gross per 24 hour  Intake 897 ml  Output 2000 ml  Net -1103 ml   Filed Weights   07/04/20 0450 07/05/20 0600 07/09/20 0549  Weight: 103.9 kg 103.2 kg 104.1 kg    Physical Exam:  General  appearance: Awake alert.  In no distress Resp: Normal effort.  Improving air entry bilaterally.  Few crackles at the bases. Cardio: S1-S2 is normal regular.  No S3-S4.  No rubs murmurs or bruit GI: Abdomen is soft.  Nontender nondistended.  Bowel sounds are present normal.  No masses organomegaly Extremities: No edema.  Full range of motion of lower extremities. Neurologic: Alert and oriented x3.  No focal neurological deficits.       Labs   Data Reviewed: I have personally reviewed following labs and imaging studies  CBC: Recent Labs  Lab 07/05/20 0517 07/07/20 0440 07/09/20 0635  WBC 18.4* 18.4* 17.7*  NEUTROABS  --   --  14.0*  HGB 15.2 14.8 14.5  HCT 44.5 43.8 43.0  MCV 83.8 84.6 85.5  PLT 557* 481* 671   Basic Metabolic Panel: Recent Labs  Lab 07/05/20 0517 07/06/20 0751 07/07/20 0440 07/08/20 0428 07/09/20 0635  NA 131* 130* 133* 132* 130*  K 4.9 4.8 4.4 4.6 4.6  CL 98 94* 96* 93* 91*  CO2 24 26 25 27 27   GLUCOSE 143* 137* 145* 132* 117*  BUN 26* 29* 33* 33* 31*  CREATININE 0.98 0.93 1.09 1.01 0.94  CALCIUM 8.8* 8.9 8.5* 8.9 8.4*  MG  --  2.6*  --   --   --    GFR: Estimated Creatinine Clearance: 102.6 mL/min (by C-G formula based on SCr of 0.94 mg/dL). Liver Function Tests: Recent Labs  Lab 07/05/20 0517 07/06/20 0751 07/07/20 0440 07/08/20 0428 07/09/20 0635  AST 42* 54* 73* 108* 188*  ALT 106* 143* 179* 281* 495*  ALKPHOS 61 61 63 62 72  BILITOT 1.3* 1.0 1.1 1.2 1.2  PROT 6.4* 6.1* 5.8* 6.1* 5.9*  ALBUMIN 3.3* 3.2* 3.1* 3.2* 3.2*     Imaging Studies   DG Chest Port 1 View  Result Date: 07/09/2020 CLINICAL DATA:  Pneumonia due to COVID EXAM: PORTABLE CHEST 1 VIEW COMPARISON:  07/05/2020 FINDINGS: Patchy bilateral airspace opacities are again noted, unchanged. Heart is normal size. No effusions or pneumothorax. No acute bony abnormality. IMPRESSION: Patchy bilateral opacities compatible with COVID pneumonia. No change. Electronically Signed   By: Rolm Baptise M.D.   On: 07/09/2020 02:55     Medications   Scheduled Meds: . acidophilus  2 capsule Oral TID  . aspirin EC  81 mg Oral Daily  . dextromethorphan-guaiFENesin  1 tablet Oral BID  . enoxaparin (LOVENOX) injection  0.5 mg/kg Subcutaneous Q24H  . feeding supplement  237 mL Oral TID BM  . influenza vac split quadrivalent PF  0.5 mL Intramuscular Tomorrow-1000  . Ipratropium-Albuterol  1 puff Inhalation Q6H  . methylPREDNISolone (SOLU-MEDROL) injection  60 mg Intravenous Q12H  . mometasone-formoterol  2 puff Inhalation BID  . multivitamin with minerals  1 tablet Oral Daily  . pantoprazole  40 mg Oral Daily  . pneumococcal 23 valent vaccine  0.5 mL Intramuscular Tomorrow-1000   Continuous Infusions:      LOS: 14 days    Bonnielee Haff,  [facility]  07/09/2020, 10:29 AM    If 7PM-7AM, please contact night-coverage. How to contact the [facility] Attending or Consulting provider [place] or covering provider during after hours [place], for this patient?    1. Check the care team in [facility] and look for a) attending/consulting [facility] provider listed and b) the [facility] team listed 2. Log into [email or web address] and use [place]'s universal password to access. If you do not have the password,  please contact the hospital operator. 3. Locate the [facility] provider you are looking for under [facility] and page to a number that you can be directly reached. 4. If you still have difficulty reaching the provider, please page the [facility] (Director on Call) for the Hospitalists  listed on amion for assistance.

## 2020-07-10 DIAGNOSIS — U071 COVID-19: Secondary | ICD-10-CM | POA: Diagnosis not present

## 2020-07-10 DIAGNOSIS — J1282 Pneumonia due to coronavirus disease 2019: Secondary | ICD-10-CM | POA: Diagnosis not present

## 2020-07-10 DIAGNOSIS — J9601 Acute respiratory failure with hypoxia: Secondary | ICD-10-CM | POA: Diagnosis not present

## 2020-07-10 LAB — COMPREHENSIVE METABOLIC PANEL
ALT: 496 U/L — ABNORMAL HIGH (ref 0–44)
AST: 153 U/L — ABNORMAL HIGH (ref 15–41)
Albumin: 3.1 g/dL — ABNORMAL LOW (ref 3.5–5.0)
Alkaline Phosphatase: 77 U/L (ref 38–126)
Anion gap: 16 — ABNORMAL HIGH (ref 5–15)
BUN: 35 mg/dL — ABNORMAL HIGH (ref 6–20)
CO2: 26 mmol/L (ref 22–32)
Calcium: 9.1 mg/dL (ref 8.9–10.3)
Chloride: 91 mmol/L — ABNORMAL LOW (ref 98–111)
Creatinine, Ser: 1 mg/dL (ref 0.61–1.24)
GFR, Estimated: >60 mL/min (ref >60)
Glucose, Bld: 160 mg/dL — ABNORMAL HIGH (ref 70–99)
Potassium: 4.6 mmol/L (ref 3.5–5.1)
Sodium: 133 mmol/L — ABNORMAL LOW (ref 135–145)
Total Bilirubin: 1.4 mg/dL — ABNORMAL HIGH (ref 0.3–1.2)
Total Protein: 5.9 g/dL — ABNORMAL LOW (ref 6.5–8.1)

## 2020-07-10 LAB — HEPATITIS PANEL, ACUTE
HCV Ab: NONREACTIVE
Hep A IgM: NONREACTIVE
Hep B C IgM: NONREACTIVE
Hepatitis B Surface Ag: NONREACTIVE

## 2020-07-10 MED ORDER — TRAZODONE HCL 50 MG PO TABS
50.0000 mg | ORAL_TABLET | Freq: Every day | ORAL | Status: DC
  Administered 2020-07-10 – 2020-07-13 (×4): 50 mg via ORAL
  Filled 2020-07-10 (×4): qty 1

## 2020-07-10 NOTE — Progress Notes (Signed)
PROGRESS NOTE    Derek Cook  ZOX:[id number] DOB: [birth year] DOA: 06/25/2020 PCP: Olin Hauser, DO   Chief complaint.  Shortness of breath. Brief Narrative:  Derek Cook a 59 y.o.malewith medical history significant forCOPD, mild persistent asthma, GERD,who presented to the ED on 10/19/2021with 1 week history of progressive shortness of breath, nonproductive cough, increased fatigue.  Recent travel to [place], no known sick Covid contacts, but is unvaccinated.  Admitted for acute respiratory failure with hypoxia due to severe Covid-19 pneumonia. Patient has completed remdesivir. Still on steroids. He also received furosemide for 6 days. On baricitinib since 10/21.  Discontinued on 11/1 due to liver function changes.   Assessment & Plan:   Principal Problem:   Pneumonia due to COVID-19 virus Active Problems:   Hyperlipidemia   GERD (gastroesophageal reflux disease)   Acute respiratory failure with hypoxia (HCC)   SOB (shortness of breath)  #1.  Acute hypoxemic respiratory failure, Covid pneumonia, secondary bacterial pneumonia. Continue steroids.  He was also treated for secondary bacterial pneumonia with Rocephin and doxycycline.  Condition had improved.  Currently he is on 4 L oxygen, continue with oxygen. Recheck a procalcitonin level and  BNP tomorrow.  #2.  Liver function changes secondary to Covid. Follow-up  4.  obesity.     DVT prophylaxis: Lovenox Code Status: Full Family Communication: None Disposition Plan:  .   Status is: Inpatient  Remains inpatient appropriate because:Inpatient level of care appropriate due to severity of illness   Dispo:  Patient From: Home  Planned Disposition: Home  Expected discharge date: 07/12/20  Medically stable for discharge: No         I/O last 3 completed shifts: In: 1894 [P.O.:1894] Out: 3050 [Urine:3050] Total I/O In: 840 [P.O.:840] Out: 8119 [Urine:1025]     Consultants:    None  Procedures: None  Antimicrobials: None  Subjective: Patient feels well today.  He was able to walk with physical therapy without significant short of breath.  He still on 4 L oxygen. No chest pain, no palpitation.  Cough, nonproductive. No fever or chills. No dysuria hematuria.  Objective: Vitals:   07/10/20 0828 07/10/20 0930 07/10/20 1119 07/10/20 1218  BP: 138/86   133/79  Pulse: 100  (!) 126 92  Resp: 16   19  Temp: 98.8 F (37.1 C)   98.6 F (37 C)  TempSrc: Oral   Oral  SpO2: 92% 95% (!) 86% 90%  Weight:      Height:        Intake/Output Summary (Last 24 hours) at 07/10/2020 1640 Last data filed at 07/10/2020 1420 Gross per 24 hour  Intake 1600 ml  Output 1875 ml  Net -275 ml   Filed Weights   07/05/20 0600 07/09/20 0549 07/10/20 0546  Weight: 103.2 kg 104.1 kg 102.7 kg    Examination:  General exam: Appears calm and comfortable  Respiratory system: Clear to auscultation. Respiratory effort normal. Cardiovascular system: S1 & S2 heard, RRR. No JVD, murmurs, rubs, gallops or clicks. No pedal edema. Gastrointestinal system: Abdomen is nondistended, soft and nontender. No organomegaly or masses felt. Normal bowel sounds heard. Central nervous system: Alert and oriented. No focal neurological deficits. Extremities: Symmetric Skin: No rashes, lesions or ulcers Psychiatry: Judgement and insight appear normal. Mood & affect appropriate.     Data Reviewed: I have personally reviewed following labs and imaging studies  CBC: Recent Labs  Lab 07/05/20 0517 07/07/20 0440 07/09/20 0635  WBC 18.4* 18.4* 17.7*  NEUTROABS  --   --  14.0*  HGB 15.2 14.8 14.5  HCT 44.5 43.8 43.0  MCV 83.8 84.6 85.5  PLT 557* 481* 643   Basic Metabolic Panel: Recent Labs  Lab 07/06/20 0751 07/07/20 0440 07/08/20 0428 07/09/20 0635 07/10/20 0454  NA 130* 133* 132* 130* 133*  K 4.8 4.4 4.6 4.6 4.6  CL 94* 96* 93* 91* 91*  CO2 26 25 27 27 26   GLUCOSE 137* 145*  132* 117* 160*  BUN 29* 33* 33* 31* 35*  CREATININE 0.93 1.09 1.01 0.94 1.00  CALCIUM 8.9 8.5* 8.9 8.4* 9.1  MG 2.6*  --   --   --   --    GFR: Estimated Creatinine Clearance: 95.8 mL/min (by C-G formula based on SCr of 1 mg/dL). Liver Function Tests: Recent Labs  Lab 07/06/20 0751 07/07/20 0440 07/08/20 0428 07/09/20 0635 07/10/20 0454  AST 54* 73* 108* 188* 153*  ALT 143* 179* 281* 495* 496*  ALKPHOS 61 63 62 72 77  BILITOT 1.0 1.1 1.2 1.2 1.4*  PROT 6.1* 5.8* 6.1* 5.9* 5.9*  ALBUMIN 3.2* 3.1* 3.2* 3.2* 3.1*   No results for input(s): LIPASE, AMYLASE in the last 168 hours. No results for input(s): AMMONIA in the last 168 hours. Coagulation Profile: No results for input(s): INR, PROTIME in the last 168 hours. Cardiac Enzymes: No results for input(s): CKTOTAL, CKMB, CKMBINDEX, TROPONINI in the last 168 hours. BNP (last 3 results) No results for input(s): PROBNP in the last 8760 hours. HbA1C: No results for input(s): HGBA1C in the last 72 hours. CBG: No results for input(s): GLUCAP in the last 168 hours. Lipid Profile: No results for input(s): CHOL, HDL, LDLCALC, TRIG, CHOLHDL, LDLDIRECT in the last 72 hours. Thyroid Function Tests: No results for input(s): TSH, T4TOTAL, FREET4, T3FREE, THYROIDAB in the last 72 hours. Anemia Panel: No results for input(s): VITAMINB12, FOLATE, FERRITIN, TIBC, IRON, RETICCTPCT in the last 72 hours. Sepsis Labs: No results for input(s): PROCALCITON, LATICACIDVEN in the last 168 hours.  No results found for this or any previous visit (from the past 240 hour(s)).       Radiology Studies: DG Chest Port 1 View  Result Date: 07/09/2020 CLINICAL DATA:  Pneumonia due to COVID EXAM: PORTABLE CHEST 1 VIEW COMPARISON:  07/05/2020 FINDINGS: Patchy bilateral airspace opacities are again noted, unchanged. Heart is normal size. No effusions or pneumothorax. No acute bony abnormality. IMPRESSION: Patchy bilateral opacities compatible with COVID  pneumonia. No change. Electronically Signed   By: Rolm Baptise M.D.   On: 07/09/2020 02:55   US Abdomen Limited RUQ (LIVER/GB)  Result Date: 07/09/2020 CLINICAL DATA:  Elevated liver function tests EXAM: ULTRASOUND ABDOMEN LIMITED RIGHT UPPER QUADRANT COMPARISON:  None. FINDINGS: Gallbladder: No gallstones or wall thickening visualized. No sonographic Murphy sign noted by sonographer. Common bile duct: Diameter: Normal, 2 mm. Liver: Increased hepatic echogenicity. No focal liver lesion. Portal vein is patent on color Doppler imaging with normal direction of blood flow towards the liver. Other: No ascites. IMPRESSION: Increased hepatic echogenicity, most consistent with steatosis. No other explanation for patient's symptoms. Electronically Signed   By: Abigail Miyamoto M.D.   On: 07/09/2020 16:26        Scheduled Meds: . acidophilus  2 capsule Oral TID  . aspirin EC  81 mg Oral Daily  . dextromethorphan-guaiFENesin  1 tablet Oral BID  . enoxaparin (LOVENOX) injection  0.5 mg/kg Subcutaneous Q24H  . feeding supplement  237 mL Oral TID BM  . influenza vac split quadrivalent PF  0.5  mL Intramuscular Tomorrow-1000  . Ipratropium-Albuterol  1 puff Inhalation Q6H  . methylPREDNISolone (SOLU-MEDROL) injection  40 mg Intravenous Q12H  . mometasone-formoterol  2 puff Inhalation BID  . multivitamin with minerals  1 tablet Oral Daily  . pantoprazole  40 mg Oral Daily  . pneumococcal 23 valent vaccine  0.5 mL Intramuscular Tomorrow-1000  . traZODone  50 mg Oral QHS   Continuous Infusions:   LOS: 15 days    Time spent: 28 minues    Sharen Hones, MD [facility]   To contact the attending provider between 7A-7P or the covering provider during after hours 7P-7A, please log into the web site [email or web address] and access using universal [employer] password for that web site. If you do not have the password, please call the hospital operator.  07/10/2020, 4:40 PM

## 2020-07-10 NOTE — Evaluation (Signed)
Physical Therapy Evaluation Patient Details Name: RODRECUS BELSKY MRN: [id number] DOB: 21-Dec-1960 Today's Date: 07/10/2020   History of Present Illness  presented to ER secondary to progressive SOB; admitted for management of acute hypoxic respiratory failure due to severe COVID-19 PNA, completed course of remdesivir  Clinical Impression  Upon evaluation, patient alert and oriented; follows commands and eager for [place] activities.  Reports noted improvement in respiratory status since admission, and does endorse some light in-room activity to this point.  Bilat UE/LE strength and ROM grossly symmetrical and WFL; no focal weakness appreciated.  Able to complete bed mobility with indep; sit/stand, basic transfers and gait (75') without assist device, close sup/cga.  Demonstrates decreased step height/length, guarded trunk rotation; slow and cautious, but no overt buckling or LOB.  Does endorse some sense of weakness due to prologed hospitalization; limited activity tolerance evident.  Sats 86% with gait on 6L supplemental O2 via [place]; recovers >92% within 30-45 seconds of seated rest. Will plan to issue HEP for use outside of therapy sessions next session; did encourage continued OOB, mobility in room with staff as appropriate to supplement therapy services.  Patient voiced understanding. Would benefit from skilled PT to address above deficits and promote optimal return to PLOF.; Recommend transition to HHPT upon discharge from acute hospitalization.     Follow Up Recommendations Home health PT    Equipment Recommendations       Recommendations for Other Services       Precautions / Restrictions Precautions Precautions: None Restrictions Weight Bearing Restrictions: No      Mobility  Bed Mobility Overal bed mobility: Modified Independent                  Transfers Overall transfer level: Needs assistance Equipment used: None Transfers: Sit to/from Stand Sit to Stand:  Supervision            Ambulation/Gait Ambulation/Gait assistance: Supervision;Min guard Gait Distance (Feet): 75 Feet Assistive device: None       General Gait Details: decreased step height/length, guarded trunk rotation; slow and cautious, but no overt buckling or LOB.  Does endorse some sense of weakness due to prologed hospitalization; limited activity tolerance evident.  Sats 86% with gait on 6L supplemental O2 via [place].  Stairs            Wheelchair Mobility    Modified Rankin (Stroke Patients Only)       Balance Overall balance assessment: Needs assistance Sitting-balance support: No upper extremity supported;Feet supported Sitting balance-Leahy Scale: Good     Standing balance support: No upper extremity supported Standing balance-Leahy Scale: Fair                               Pertinent Vitals/Pain Pain Assessment: No/denies pain    Home Living Family/patient expects to be discharged to:: Private residence Living Arrangements: Spouse/significant other   Type of Home: Mobile home Home Access: Stairs to enter   [employer] of Steps: 3 Home Layout: One level Home Equipment: None      Prior Function Level of Independence: Independent         Comments: Indep with ADLs, household and community mobilization without assist device; no home O2.  Working as heavy Facilities manager        Extremity/Trunk Assessment   Upper Extremity Assessment Upper Extremity Assessment: Overall WFL for tasks assessed    Lower  Extremity Assessment Lower Extremity Assessment: Overall WFL for tasks assessed (grossly at least 4+/5 throughout; no focal weakness appreciated)       Communication   Communication: No difficulties  Cognition Arousal/Alertness: Awake/alert Behavior During Therapy: WFL for tasks assessed/performed Overall Cognitive Status: Within Functional Limits for tasks assessed                                         General Comments      Exercises Other Exercises Other Exercises: Educated in role of PT and progressive mobility, importance of activity pacing/energy conservation, O2 cord management.  Patient voiced understanding of all information.  Will plan to issue HEP for use outside of therapy next session   Assessment/Plan    PT Assessment Patient needs continued PT services  PT Problem List Decreased activity tolerance;Decreased balance;Decreased mobility;Decreased strength;Decreased knowledge of use of DME;Decreased safety awareness;Decreased knowledge of precautions;Cardiopulmonary status limiting activity       PT Treatment Interventions DME instruction;Gait training;Stair training;Functional mobility training;Therapeutic activities;Therapeutic exercise;Balance training;Patient/family education    PT Goals (Current goals can be found in the Care Plan section)  Acute Rehab PT Goals Patient Stated Goal: to return home PT Goal Formulation: With patient Time For Goal Achievement: 07/24/20 Potential to Achieve Goals: Good    Frequency Min 2X/week   Barriers to discharge        Co-evaluation               AM-PAC PT "6 Clicks" Mobility  Outcome Measure Help needed turning from your back to your side while in a flat bed without using bedrails?: None Help needed moving from lying on your back to sitting on the side of a flat bed without using bedrails?: None Help needed moving to and from a bed to a chair (including a wheelchair)?: None Help needed standing up from a chair using your arms (e.g., wheelchair or bedside chair)?: None Help needed to walk in hospital room?: A Little Help needed climbing 3-5 steps with a railing? : A Little 6 Click Score: 22    End of Session Equipment Utilized During Treatment: Oxygen Activity Tolerance: Patient tolerated treatment well Patient left: in chair;with call bell/phone within reach;with chair alarm  set Nurse Communication: Mobility status PT Visit Diagnosis: Muscle weakness (generalized) (M62.81);Difficulty in walking, not elsewhere classified (R26.2)    Time: 5732-2025 PT Time Calculation (min) (ACUTE ONLY): 29 min   Charges:   PT Evaluation $PT Eval Moderate Complexity: 1 Mod PT Treatments $Therapeutic Activity: 8-22 mins        Nyia Tsao H. Owens Shark, PT, DPT, NCS 07/10/20, 11:30 AM [phone]

## 2020-07-10 NOTE — Plan of Care (Signed)

## 2020-07-11 DIAGNOSIS — E871 Hypo-osmolality and hyponatremia: Secondary | ICD-10-CM

## 2020-07-11 DIAGNOSIS — J9601 Acute respiratory failure with hypoxia: Secondary | ICD-10-CM | POA: Diagnosis not present

## 2020-07-11 DIAGNOSIS — R7401 Elevation of levels of liver transaminase levels: Secondary | ICD-10-CM

## 2020-07-11 DIAGNOSIS — U071 COVID-19: Secondary | ICD-10-CM | POA: Diagnosis not present

## 2020-07-11 LAB — HEPATIC FUNCTION PANEL
ALT: 547 U/L — ABNORMAL HIGH (ref 0–44)
AST: 163 U/L — ABNORMAL HIGH (ref 15–41)
Albumin: 2.9 g/dL — ABNORMAL LOW (ref 3.5–5.0)
Alkaline Phosphatase: 71 U/L (ref 38–126)
Bilirubin, Direct: 0.3 mg/dL — ABNORMAL HIGH (ref 0.0–0.2)
Indirect Bilirubin: 1.1 mg/dL — ABNORMAL HIGH (ref 0.3–0.9)
Total Bilirubin: 1.4 mg/dL — ABNORMAL HIGH (ref 0.3–1.2)
Total Protein: 5.8 g/dL — ABNORMAL LOW (ref 6.5–8.1)

## 2020-07-11 LAB — CBC WITH DIFFERENTIAL/PLATELET
Abs Immature Granulocytes: 1.15 10*3/uL — ABNORMAL HIGH (ref 0.00–0.07)
Basophils Absolute: 0.1 10*3/uL (ref 0.0–0.1)
Basophils Relative: 1 %
Eosinophils Absolute: 0 10*3/uL (ref 0.0–0.5)
Eosinophils Relative: 0 %
HCT: 43.1 % (ref 39.0–52.0)
Hemoglobin: 14.4 g/dL (ref 13.0–17.0)
Immature Granulocytes: 6 %
Lymphocytes Relative: 8 %
Lymphs Abs: 1.5 10*3/uL (ref 0.7–4.0)
MCH: 28.6 pg (ref 26.0–34.0)
MCHC: 33.4 g/dL (ref 30.0–36.0)
MCV: 85.7 fL (ref 80.0–100.0)
Monocytes Absolute: 1.6 10*3/uL — ABNORMAL HIGH (ref 0.1–1.0)
Monocytes Relative: 9 %
Neutro Abs: 14.5 10*3/uL — ABNORMAL HIGH (ref 1.7–7.7)
Neutrophils Relative %: 76 %
Platelets: 314 10*3/uL (ref 150–400)
RBC: 5.03 MIL/uL (ref 4.22–5.81)
RDW: 15.1 % (ref 11.5–15.5)
Smear Review: NORMAL
WBC: 18.9 10*3/uL — ABNORMAL HIGH (ref 4.0–10.5)
nRBC: 0 % (ref 0.0–0.2)

## 2020-07-11 LAB — BASIC METABOLIC PANEL
Anion gap: 14 (ref 5–15)
BUN: 30 mg/dL — ABNORMAL HIGH (ref 6–20)
CO2: 27 mmol/L (ref 22–32)
Calcium: 8.6 mg/dL — ABNORMAL LOW (ref 8.9–10.3)
Chloride: 90 mmol/L — ABNORMAL LOW (ref 98–111)
Creatinine, Ser: 1 mg/dL (ref 0.61–1.24)
GFR, Estimated: >60 mL/min (ref >60)
Glucose, Bld: 124 mg/dL — ABNORMAL HIGH (ref 70–99)
Potassium: 5.1 mmol/L (ref 3.5–5.1)
Sodium: 131 mmol/L — ABNORMAL LOW (ref 135–145)

## 2020-07-11 LAB — MAGNESIUM: Magnesium: 2.7 mg/dL — ABNORMAL HIGH (ref 1.7–2.4)

## 2020-07-11 LAB — PROCALCITONIN: Procalcitonin: 0.3 ng/mL

## 2020-07-11 LAB — BRAIN NATRIURETIC PEPTIDE: B Natriuretic Peptide: 82.3 pg/mL (ref 0.0–100.0)

## 2020-07-11 MED ORDER — SODIUM CHLORIDE 0.9 % IV SOLN
1.0000 g | INTRAVENOUS | Status: DC
  Administered 2020-07-11: 1 g via INTRAVENOUS
  Filled 2020-07-11 (×2): qty 10

## 2020-07-11 NOTE — Progress Notes (Signed)
PROGRESS NOTE    Derek Cook  XTK:[id number] DOB: 1961/04/27 DOA: 06/25/2020 PCP: Olin Hauser, DO   Chief complaint.  Shortness of breath. Brief Narrative:  Derek Cook a 59 y.o.malewith medical history significant forCOPD, mild persistent asthma, GERD,who presented to the ED on 10/19/2021with 1 week history of progressive shortness of breath, nonproductive cough, increased fatigue. Recent travel to [place], no known sick Covid contacts, but is unvaccinated. Admitted for acute respiratory failure with hypoxia due to severe Covid-19 pneumonia. Patient has completed remdesivir. Still on steroids. He also received furosemide for 6 days. On baricitinib since 10/21.  Discontinued on 11/1 due to liver function changes.   Assessment & Plan:   Principal Problem:   Pneumonia due to COVID-19 virus Active Problems:   Hyperlipidemia   GERD (gastroesophageal reflux disease)   Acute respiratory failure with hypoxia (HCC)   SOB (shortness of breath)  #1 acute hypoxemic respiratory failure, Covid pneumonia, secondary bacterial pneumonia. Patient still on 6 L oxygen today (never was on 4 L).  Slow improvement.  Still has a mild elevation of procalcitonin level, I will restart Rocephin for 3 more days. Continue with oxygen. BMP normal, no evidence of volume overload.  2.  Liver function changes secondary to Covid. Continue to follow.  3.  Hyponatremia. Secondary to Covid.     DVT prophylaxis: Lovenox Code Status: Full Family Communication:  Disposition Plan:  .   Status is: Inpatient  Remains inpatient appropriate because:Inpatient level of care appropriate due to severity of illness   Dispo:  Patient From: Home  Planned Disposition: Home  Expected discharge date: 07/12/20  Medically stable for discharge: No         I/O last 3 completed shifts: In: 9924 [P.O.:1840] Out: 2075 [Urine:2075] No intake/output data recorded.     Consultants:    none  Procedures: none  Antimicrobials: rocephin  Subjective: Patient feels well today.  He was able to sit in the chair by himself.  Still some short of breath with exertion, no cough. No fever or chills. No abdominal pain or nausea vomiting. No dysuria hematuria.  Objective: Vitals:   07/10/20 2041 07/11/20 0500 07/11/20 0505 07/11/20 1053  BP: 121/72  136/78 128/77  Pulse: 96   85  Resp: 20  (!) 22 19  Temp: 97.8 F (36.6 C)  98.2 F (36.8 C) 98 F (36.7 C)  TempSrc: Oral  Oral Oral  SpO2: 94%  94% 97%  Weight:  103.7 kg    Height:        Intake/Output Summary (Last 24 hours) at 07/11/2020 1348 Last data filed at 07/11/2020 0216 Gross per 24 hour  Intake 600 ml  Output 800 ml  Net -200 ml   Filed Weights   07/09/20 0549 07/10/20 0546 07/11/20 0500  Weight: 104.1 kg 102.7 kg 103.7 kg    Examination:  General exam: Appears calm and comfortable  Respiratory system: Clear to auscultation. Respiratory effort normal. Cardiovascular system: S1 & S2 heard, RRR. No JVD, murmurs, rubs, gallops or clicks. No pedal edema. Gastrointestinal system: Abdomen is nondistended, soft and nontender. No organomegaly or masses felt. Normal bowel sounds heard. Central nervous system: Alert and oriented. No focal neurological deficits. Extremities: Symmetric Skin: No rashes, lesions or ulcers Psychiatry:  Mood & affect appropriate.     Data Reviewed: I have personally reviewed following labs and imaging studies  CBC: Recent Labs  Lab 07/05/20 0517 07/07/20 0440 07/09/20 0635 07/11/20 0512  WBC 18.4* 18.4* 17.7* 18.9*  NEUTROABS  --   --  14.0* 14.5*  HGB 15.2 14.8 14.5 14.4  HCT 44.5 43.8 43.0 43.1  MCV 83.8 84.6 85.5 85.7  PLT 557* 481* 351 751   Basic Metabolic Panel: Recent Labs  Lab 07/06/20 0751 07/06/20 0751 07/07/20 0440 07/08/20 0428 07/09/20 0635 07/10/20 0454 07/11/20 0512  NA 130*   < > 133* 132* 130* 133* 131*  K 4.8   < > 4.4 4.6 4.6 4.6 5.1   CL 94*   < > 96* 93* 91* 91* 90*  CO2 26   < > 25 27 27 26 27   GLUCOSE 137*   < > 145* 132* 117* 160* 124*  BUN 29*   < > 33* 33* 31* 35* 30*  CREATININE 0.93   < > 1.09 1.01 0.94 1.00 1.00  CALCIUM 8.9   < > 8.5* 8.9 8.4* 9.1 8.6*  MG 2.6*  --   --   --   --   --  2.7*   < > = values in this interval not displayed.   GFR: Estimated Creatinine Clearance: 96.3 mL/min (by C-G formula based on SCr of 1 mg/dL). Liver Function Tests: Recent Labs  Lab 07/07/20 0440 07/08/20 0428 07/09/20 0635 07/10/20 0454 07/11/20 0512  AST 73* 108* 188* 153* 163*  ALT 179* 281* 495* 496* 547*  ALKPHOS 63 62 72 77 71  BILITOT 1.1 1.2 1.2 1.4* 1.4*  PROT 5.8* 6.1* 5.9* 5.9* 5.8*  ALBUMIN 3.1* 3.2* 3.2* 3.1* 2.9*   No results for input(s): LIPASE, AMYLASE in the last 168 hours. No results for input(s): AMMONIA in the last 168 hours. Coagulation Profile: No results for input(s): INR, PROTIME in the last 168 hours. Cardiac Enzymes: No results for input(s): CKTOTAL, CKMB, CKMBINDEX, TROPONINI in the last 168 hours. BNP (last 3 results) No results for input(s): PROBNP in the last 8760 hours. HbA1C: No results for input(s): HGBA1C in the last 72 hours. CBG: No results for input(s): GLUCAP in the last 168 hours. Lipid Profile: No results for input(s): CHOL, HDL, LDLCALC, TRIG, CHOLHDL, LDLDIRECT in the last 72 hours. Thyroid Function Tests: No results for input(s): TSH, T4TOTAL, FREET4, T3FREE, THYROIDAB in the last 72 hours. Anemia Panel: No results for input(s): VITAMINB12, FOLATE, FERRITIN, TIBC, IRON, RETICCTPCT in the last 72 hours. Sepsis Labs: Recent Labs  Lab 07/11/20 0512  PROCALCITON 0.30    No results found for this or any previous visit (from the past 240 hour(s)).       Radiology Studies: US Abdomen Limited RUQ (LIVER/GB)  Result Date: 07/09/2020 CLINICAL DATA:  Elevated liver function tests EXAM: ULTRASOUND ABDOMEN LIMITED RIGHT UPPER QUADRANT COMPARISON:  None.  FINDINGS: Gallbladder: No gallstones or wall thickening visualized. No sonographic Murphy sign noted by sonographer. Common bile duct: Diameter: Normal, 2 mm. Liver: Increased hepatic echogenicity. No focal liver lesion. Portal vein is patent on color Doppler imaging with normal direction of blood flow towards the liver. Other: No ascites. IMPRESSION: Increased hepatic echogenicity, most consistent with steatosis. No other explanation for patient's symptoms. Electronically Signed   By: Abigail Miyamoto M.D.   On: 07/09/2020 16:26        Scheduled Meds: . acidophilus  2 capsule Oral TID  . aspirin EC  81 mg Oral Daily  . dextromethorphan-guaiFENesin  1 tablet Oral BID  . enoxaparin (LOVENOX) injection  0.5 mg/kg Subcutaneous Q24H  . feeding supplement  237 mL Oral TID BM  . influenza vac split quadrivalent PF  0.5  mL Intramuscular Tomorrow-1000  . Ipratropium-Albuterol  1 puff Inhalation Q6H  . methylPREDNISolone (SOLU-MEDROL) injection  40 mg Intravenous Q12H  . mometasone-formoterol  2 puff Inhalation BID  . multivitamin with minerals  1 tablet Oral Daily  . pantoprazole  40 mg Oral Daily  . pneumococcal 23 valent vaccine  0.5 mL Intramuscular Tomorrow-1000  . traZODone  50 mg Oral QHS   Continuous Infusions: . cefTRIAXone (ROCEPHIN)  IV       LOS: 16 days    Time spent: 28 minutes    Sharen Hones, MD [facility]   To contact the attending provider between 7A-7P or the covering provider during after hours 7P-7A, please log into the web site [email or web address] and access using universal [place] password for that web site. If you do not have the password, please call the hospital operator.  07/11/2020, 1:48 PM

## 2020-07-11 NOTE — Progress Notes (Signed)
Nutrition Follow Up Note   DOCUMENTATION CODES:   Obesity unspecified  INTERVENTION:   Ensure Enlive po TID, each supplement provides 350 kcal and 20 grams of protein  MVI daily   NUTRITION DIAGNOSIS:   Increased nutrient needs related to catabolic illness (COPD, COVID 19) as evidenced by increased estimated needs.  GOAL:   Patient will meet greater than or equal to 90% of their needs  -progressing   MONITOR:   PO intake, Supplement acceptance, Labs, Weight trends, Skin, I & O's  ASSESSMENT:   59 y.o. male with medical history significant for COPD, mild persistent asthma and GERD who is admitted with COVID 19   Pt continues to have good appetite and oral intake in hospital; pt eating 100% of meals and drinking Ensure supplements. Per chart, pt's weight has remained stable since admit. Recommend continue supplements and MVI.   Medications reviewed and include: risaquad, aspirin, lovenox, protonix, solu-medrol, MVI  Labs reviewed: Na 131(L), BUN 30(H), AST 163(H), tbili 1.4(H) Wbc- 18.9(H)  Diet Order:   Diet Order            Diet regular Room service appropriate? Yes; Fluid consistency: Thin  Diet effective now                EDUCATION NEEDS:   Education needs have been addressed  Skin:  Skin Assessment: Reviewed RN Assessment  Last BM:  11/4- type 6  Height:   Ht Readings from Last 1 Encounters:  06/27/20 5\' 8"  (1.727 m)    Weight:   Wt Readings from Last 1 Encounters:  07/11/20 103.7 kg    Ideal Body Weight:  70 kg  BMI:  Body mass index is 34.76 kg/m.  Estimated Nutritional Needs:   Kcal:  2300-2600kcal/day  Protein:  115-130g/day  Fluid:  2.1-2.4L/day  Koleen Distance MS, RD, LDN Please refer to [facility] for RD and/or RD on-call/weekend/after hours pager

## 2020-07-12 DIAGNOSIS — J1282 Pneumonia due to coronavirus disease 2019: Secondary | ICD-10-CM | POA: Diagnosis not present

## 2020-07-12 DIAGNOSIS — U071 COVID-19: Secondary | ICD-10-CM | POA: Diagnosis not present

## 2020-07-12 DIAGNOSIS — E871 Hypo-osmolality and hyponatremia: Secondary | ICD-10-CM | POA: Diagnosis not present

## 2020-07-12 DIAGNOSIS — J9601 Acute respiratory failure with hypoxia: Secondary | ICD-10-CM | POA: Diagnosis not present

## 2020-07-12 LAB — BASIC METABOLIC PANEL
Anion gap: 12 (ref 5–15)
BUN: 24 mg/dL — ABNORMAL HIGH (ref 6–20)
CO2: 23 mmol/L (ref 22–32)
Calcium: 8.8 mg/dL — ABNORMAL LOW (ref 8.9–10.3)
Chloride: 94 mmol/L — ABNORMAL LOW (ref 98–111)
Creatinine, Ser: 0.97 mg/dL (ref 0.61–1.24)
GFR, Estimated: >60 mL/min (ref >60)
Glucose, Bld: 171 mg/dL — ABNORMAL HIGH (ref 70–99)
Potassium: 4.7 mmol/L (ref 3.5–5.1)
Sodium: 129 mmol/L — ABNORMAL LOW (ref 135–145)

## 2020-07-12 LAB — CBC WITH DIFFERENTIAL/PLATELET
Abs Immature Granulocytes: 1.4 10*3/uL — ABNORMAL HIGH (ref 0.00–0.07)
Basophils Absolute: 0.1 10*3/uL (ref 0.0–0.1)
Basophils Relative: 1 %
Eosinophils Absolute: 0 10*3/uL (ref 0.0–0.5)
Eosinophils Relative: 0 %
HCT: 41.4 % (ref 39.0–52.0)
Hemoglobin: 13.4 g/dL (ref 13.0–17.0)
Immature Granulocytes: 8 %
Lymphocytes Relative: 7 %
Lymphs Abs: 1.2 10*3/uL (ref 0.7–4.0)
MCH: 28.2 pg (ref 26.0–34.0)
MCHC: 32.4 g/dL (ref 30.0–36.0)
MCV: 87.2 fL (ref 80.0–100.0)
Monocytes Absolute: 1.2 10*3/uL — ABNORMAL HIGH (ref 0.1–1.0)
Monocytes Relative: 7 %
Neutro Abs: 13.3 10*3/uL — ABNORMAL HIGH (ref 1.7–7.7)
Neutrophils Relative %: 77 %
Platelets: 257 10*3/uL (ref 150–400)
RBC: 4.75 MIL/uL (ref 4.22–5.81)
RDW: 15 % (ref 11.5–15.5)
Smear Review: NORMAL
WBC: 17.2 10*3/uL — ABNORMAL HIGH (ref 4.0–10.5)
nRBC: 0 % (ref 0.0–0.2)

## 2020-07-12 LAB — MAGNESIUM: Magnesium: 2.6 mg/dL — ABNORMAL HIGH (ref 1.7–2.4)

## 2020-07-12 MED ORDER — SODIUM CHLORIDE 1 G PO TABS
1.0000 g | ORAL_TABLET | Freq: Two times a day (BID) | ORAL | Status: DC
  Administered 2020-07-12 – 2020-07-14 (×5): 1 g via ORAL
  Filled 2020-07-12 (×7): qty 1

## 2020-07-12 MED ORDER — SODIUM CHLORIDE 0.9 % IV SOLN
2.0000 g | INTRAVENOUS | Status: AC
  Administered 2020-07-12 – 2020-07-13 (×2): 2 g via INTRAVENOUS
  Filled 2020-07-12: qty 2
  Filled 2020-07-12: qty 20

## 2020-07-12 MED ORDER — SODIUM CHLORIDE 0.9 % IV SOLN: INTRAVENOUS | Status: DC | PRN

## 2020-07-12 NOTE — Progress Notes (Signed)
Patient is requesting to be seen by PT again. He wants to move around more in the room he says. Patient is independent in the room giving self bath etc. I will educate him about moving around in room to build strength as well as working with staff to do this.  Patient has request to clean room. I explained environmental services cleans room after patient leaves; I offered to sweep patient's room for him and he declined. I will discuss this issue with Director Posey Pronto; rooms should be cleaned while patient's are staying.  Patient explained hearing aids have been lost in ED. He has spoken to [employer] about this, I informed him I will follow up with her today.

## 2020-07-12 NOTE — Progress Notes (Signed)
PROGRESS NOTE    Derek Cook  PQZ:[id number] DOB: 1961/07/31 DOA: 06/25/2020 PCP: Olin Hauser, DO   Chief complaint.  Shortness of breath. Brief Narrative:  Derek Cook a 59 y.o.malewith medical history significant forCOPD, mild persistent asthma, GERD,who presented to the ED on 10/19/2021with 1 week history of progressive shortness of breath, nonproductive cough, increased fatigue. Recent travel to [place], no known sick Covid contacts, but is unvaccinated. Admitted for acute respiratory failure with hypoxia due to severe Covid-19 pneumonia.Patient has completed remdesivir. Still on steroids. He also received furosemide for 6 days. On baricitinib since 10/21. Discontinued on 11/1 due to liver function changes.   Assessment & Plan:   Principal Problem:   Pneumonia due to COVID-19 virus Active Problems:   Hyperlipidemia   GERD (gastroesophageal reflux disease)   Acute respiratory failure with hypoxia (HCC)   SOB (shortness of breath)   Elevated liver transaminase level   Hyponatremia  #1.  Acute hypoxemic respite failure, Covid pneumonia, secondary bacterial pneumonia. Patient is gradually improving, currently on 5 L oxygen, continue to wean oxygen. Continue Rocephin for 2 more days. No evidence of volume overload. Patient has somewhat depressed mood due to long stay in the hospital.  Consulted patient for about 15 minutes. His condition overall is improving, he is also progressed well with physical therapy/Occupational Therapy.  #2.  Liver function changes secondary to Covid.  3.  Hyponatremia. Likely secondary to Covid infection.  Currently patient euvolemic.  I will start salt tablets twice a day.   DVT prophylaxis: Lovenox Code Status: Full Family Communication: Updated patient wife.  Disposition Plan:  .   Status is: Inpatient  Remains inpatient appropriate because:Inpatient level of care appropriate due to severity of  illness   Dispo:  Patient From: Home  Planned Disposition: Home  Expected discharge date: 11/8  Medically stable for discharge: No         I/O last 3 completed shifts: In: 25 [P.O.:720; IV Piggyback:100] Out: 2050 [Urine:2050] No intake/output data recorded.     Consultants:   None  Procedures: None  Antimicrobials: Rocephin.  Subjective: Patient doing well today.  Patient is frustrated due to long stay in the hospital.  Had a long discussion with him today, pointing to the positive side of his condition.  Gradually improving.  Oxygenation is better. He was able to walk to the chair, sitting the chair most of the time. Still has some short of breath with exertion, no cough. No abdominal pain nausea vomiting, good appetite. No dysuria hematuria.   No fever chills.  Objective: Vitals:   07/11/20 1849 07/11/20 2124 07/12/20 0512 07/12/20 0832  BP: 139/74 131/69 135/73 (!) 152/83  Pulse:  (!) 110 72 86  Resp:  18 16 15   Temp: 98.4 F (36.9 C) 98.2 F (36.8 C) 98.7 F (37.1 C) 98.1 F (36.7 C)  TempSrc: Oral Oral Oral Oral  SpO2: 92% 94% 99% 94%  Weight:   105.3 kg   Height:        Intake/Output Summary (Last 24 hours) at 07/12/2020 0939 Last data filed at 07/12/2020 0516 Gross per 24 hour  Intake 580 ml  Output 1100 ml  Net -520 ml   Filed Weights   07/10/20 0546 07/11/20 0500 07/12/20 0512  Weight: 102.7 kg 103.7 kg 105.3 kg    Examination:  General exam: Appears calm and comfortable  Respiratory system: Clear to auscultation. Respiratory effort normal. Cardiovascular system: S1 & S2 heard, RRR. No JVD, murmurs, rubs,  gallops or clicks. No pedal edema. Gastrointestinal system: Abdomen is nondistended, soft and nontender. No organomegaly or masses felt. Normal bowel sounds heard. Central nervous system: Alert and oriented x3. No focal neurological deficits. Extremities: Symmetric 5 x 5 power. Skin: No rashes, lesions or ulcers Psychiatry: Mood &  affect appropriate.     Data Reviewed: I have personally reviewed following labs and imaging studies  CBC: Recent Labs  Lab 07/07/20 0440 07/09/20 0635 07/11/20 0512 07/12/20 0415  WBC 18.4* 17.7* 18.9* 17.2*  NEUTROABS  --  14.0* 14.5* 13.3*  HGB 14.8 14.5 14.4 13.4  HCT 43.8 43.0 43.1 41.4  MCV 84.6 85.5 85.7 87.2  PLT 481* 351 314 790   Basic Metabolic Panel: Recent Labs  Lab 07/06/20 0751 07/07/20 0440 07/08/20 0428 07/09/20 0635 07/10/20 0454 07/11/20 0512 07/12/20 0415  NA 130*   < > 132* 130* 133* 131* 129*  K 4.8   < > 4.6 4.6 4.6 5.1 4.7  CL 94*   < > 93* 91* 91* 90* 94*  CO2 26   < > 27 27 26 27 23   GLUCOSE 137*   < > 132* 117* 160* 124* 171*  BUN 29*   < > 33* 31* 35* 30* 24*  CREATININE 0.93   < > 1.01 0.94 1.00 1.00 0.97  CALCIUM 8.9   < > 8.9 8.4* 9.1 8.6* 8.8*  MG 2.6*  --   --   --   --  2.7* 2.6*   < > = values in this interval not displayed.   GFR: Estimated Creatinine Clearance: 100.1 mL/min (by C-G formula based on SCr of 0.97 mg/dL). Liver Function Tests: Recent Labs  Lab 07/07/20 0440 07/08/20 0428 07/09/20 0635 07/10/20 0454 07/11/20 0512  AST 73* 108* 188* 153* 163*  ALT 179* 281* 495* 496* 547*  ALKPHOS 63 62 72 77 71  BILITOT 1.1 1.2 1.2 1.4* 1.4*  PROT 5.8* 6.1* 5.9* 5.9* 5.8*  ALBUMIN 3.1* 3.2* 3.2* 3.1* 2.9*   No results for input(s): LIPASE, AMYLASE in the last 168 hours. No results for input(s): AMMONIA in the last 168 hours. Coagulation Profile: No results for input(s): INR, PROTIME in the last 168 hours. Cardiac Enzymes: No results for input(s): CKTOTAL, CKMB, CKMBINDEX, TROPONINI in the last 168 hours. BNP (last 3 results) No results for input(s): PROBNP in the last 8760 hours. HbA1C: No results for input(s): HGBA1C in the last 72 hours. CBG: No results for input(s): GLUCAP in the last 168 hours. Lipid Profile: No results for input(s): CHOL, HDL, LDLCALC, TRIG, CHOLHDL, LDLDIRECT in the last 72 hours. Thyroid  Function Tests: No results for input(s): TSH, T4TOTAL, FREET4, T3FREE, THYROIDAB in the last 72 hours. Anemia Panel: No results for input(s): VITAMINB12, FOLATE, FERRITIN, TIBC, IRON, RETICCTPCT in the last 72 hours. Sepsis Labs: Recent Labs  Lab 07/11/20 0512  PROCALCITON 0.30    No results found for this or any previous visit (from the past 240 hour(s)).       Radiology Studies: No results found.      Scheduled Meds: . acidophilus  2 capsule Oral TID  . aspirin EC  81 mg Oral Daily  . dextromethorphan-guaiFENesin  1 tablet Oral BID  . enoxaparin (LOVENOX) injection  0.5 mg/kg Subcutaneous Q24H  . feeding supplement  237 mL Oral TID BM  . influenza vac split quadrivalent PF  0.5 mL Intramuscular Tomorrow-1000  . Ipratropium-Albuterol  1 puff Inhalation Q6H  . methylPREDNISolone (SOLU-MEDROL) injection  40 mg Intravenous  Q12H  . mometasone-formoterol  2 puff Inhalation BID  . multivitamin with minerals  1 tablet Oral Daily  . pantoprazole  40 mg Oral Daily  . pneumococcal 23 valent vaccine  0.5 mL Intramuscular Tomorrow-1000  . sodium chloride  1 g Oral BID WC  . traZODone  50 mg Oral QHS   Continuous Infusions: . cefTRIAXone (ROCEPHIN)  IV       LOS: 17 days    Time spent: 36 minutes    Sharen Hones, MD [facility]   To contact the attending provider between 7A-7P or the covering provider during after hours 7P-7A, please log into the web site [email or web address] and access using universal [place] password for that web site. If you do not have the password, please call the hospital operator.  07/12/2020, 9:39 AM

## 2020-07-12 NOTE — Progress Notes (Signed)
PT Cancellation Note  Patient Details Name: Derek Cook MRN: [id number] DOB: 1960/11/22   Cancelled Treatment:    Reason Eval/Treat Not Completed: Fatigue/lethargy limiting ability to participate (Treatment session attempted. Patient currently resting in bed; reports fatigue from previous indep gait trials in room.  Declines additional activity at this time, but does request therapist re-attempt next date. Issued handouts with written/pictorial descriptions for standing LE therex for use as HEP; also encouraged transition from [facility] to formal bathroom for toileting needs (to better simulate home mobility/environment).  Patient voiced understanding/agreement; CNA to place additional O2 extension cords upon return to room to facilitate.)   Jaycelynn Knickerbocker H. Owens Shark, PT, DPT, NCS 07/12/20, 2:28 PM [phone]

## 2020-07-12 NOTE — Plan of Care (Signed)

## 2020-07-13 DIAGNOSIS — U071 COVID-19: Secondary | ICD-10-CM | POA: Diagnosis not present

## 2020-07-13 DIAGNOSIS — J1282 Pneumonia due to coronavirus disease 2019: Secondary | ICD-10-CM | POA: Diagnosis not present

## 2020-07-13 DIAGNOSIS — R7401 Elevation of levels of liver transaminase levels: Secondary | ICD-10-CM | POA: Diagnosis not present

## 2020-07-13 DIAGNOSIS — J9601 Acute respiratory failure with hypoxia: Secondary | ICD-10-CM | POA: Diagnosis not present

## 2020-07-13 LAB — CBC WITH DIFFERENTIAL/PLATELET
Abs Immature Granulocytes: 1.6 10*3/uL — ABNORMAL HIGH (ref 0.00–0.07)
Basophils Absolute: 0.1 10*3/uL (ref 0.0–0.1)
Basophils Relative: 1 %
Eosinophils Absolute: 0 10*3/uL (ref 0.0–0.5)
Eosinophils Relative: 0 %
HCT: 41.8 % (ref 39.0–52.0)
Hemoglobin: 13.7 g/dL (ref 13.0–17.0)
Immature Granulocytes: 9 %
Lymphocytes Relative: 8 %
Lymphs Abs: 1.4 10*3/uL (ref 0.7–4.0)
MCH: 28 pg (ref 26.0–34.0)
MCHC: 32.8 g/dL (ref 30.0–36.0)
MCV: 85.5 fL (ref 80.0–100.0)
Monocytes Absolute: 1 10*3/uL (ref 0.1–1.0)
Monocytes Relative: 6 %
Neutro Abs: 13.5 10*3/uL — ABNORMAL HIGH (ref 1.7–7.7)
Neutrophils Relative %: 76 %
Platelets: 225 10*3/uL (ref 150–400)
RBC: 4.89 MIL/uL (ref 4.22–5.81)
RDW: 15.2 % (ref 11.5–15.5)
Smear Review: NORMAL
WBC: 17.6 10*3/uL — ABNORMAL HIGH (ref 4.0–10.5)
nRBC: 0.1 % (ref 0.0–0.2)

## 2020-07-13 LAB — HEPATIC FUNCTION PANEL
ALT: 549 U/L — ABNORMAL HIGH (ref 0–44)
AST: 125 U/L — ABNORMAL HIGH (ref 15–41)
Albumin: 2.9 g/dL — ABNORMAL LOW (ref 3.5–5.0)
Alkaline Phosphatase: 71 U/L (ref 38–126)
Bilirubin, Direct: 0.2 mg/dL (ref 0.0–0.2)
Indirect Bilirubin: 0.7 mg/dL (ref 0.3–0.9)
Total Bilirubin: 0.9 mg/dL (ref 0.3–1.2)
Total Protein: 5.6 g/dL — ABNORMAL LOW (ref 6.5–8.1)

## 2020-07-13 LAB — BASIC METABOLIC PANEL
Anion gap: 11 (ref 5–15)
BUN: 23 mg/dL — ABNORMAL HIGH (ref 6–20)
CO2: 23 mmol/L (ref 22–32)
Calcium: 8.5 mg/dL — ABNORMAL LOW (ref 8.9–10.3)
Chloride: 97 mmol/L — ABNORMAL LOW (ref 98–111)
Creatinine, Ser: 0.81 mg/dL (ref 0.61–1.24)
GFR, Estimated: >60 mL/min (ref >60)
Glucose, Bld: 138 mg/dL — ABNORMAL HIGH (ref 70–99)
Potassium: 4.8 mmol/L (ref 3.5–5.1)
Sodium: 131 mmol/L — ABNORMAL LOW (ref 135–145)

## 2020-07-13 LAB — MAGNESIUM: Magnesium: 2.6 mg/dL — ABNORMAL HIGH (ref 1.7–2.4)

## 2020-07-13 NOTE — Progress Notes (Signed)
PROGRESS NOTE    Derek Cook  XTG:[id number] DOB: 05-Apr-1961 DOA: 06/25/2020 PCP: Olin Hauser, DO   Chief complaint.  Shortness of breath. Brief Narrative:  Derek Cook a 59 y.o.malewith medical history significant forCOPD, mild persistent asthma, GERD,who presented to the ED on 10/19/2021with 1 week history of progressive shortness of breath, nonproductive cough, increased fatigue. Recent travel to [place], no known sick Covid contacts, but is unvaccinated. Admitted for acute respiratory failure with hypoxia due to severe Covid-19 pneumonia.Patient has completed remdesivir. Still on steroids. He also received furosemide for 6 days. On baricitinib since 10/21. Discontinued on 11/1 due to liver function changes.   Assessment & Plan:   Principal Problem:   Pneumonia due to COVID-19 virus Active Problems:   Hyperlipidemia   GERD (gastroesophageal reflux disease)   Acute respiratory failure with hypoxia (HCC)   SOB (shortness of breath)   Elevated liver transaminase level   Hyponatremia   #1.  Acute hypoxemic respiratory failure, Covid pneumonia, secondary bacterial pneumonia. Condition continued to improve, currently on 4 L oxygen.  Continue to wean oxygen, anticipating discharge in 2 days. Continue Rocephin for 1 more day. No volume overload. Continue prophylactic Lovenox.  2.  Liver function changes secondary to Covid. Finally improving.  3.  Hyponatremia. 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  Euvolemic.  Continue salt tablet.  Sodium level getting better.   DVT prophylaxis: Lovenox Code Status: Full Family Communication: None Disposition Plan:  .   Status is: Inpatient  Remains inpatient appropriate because:Inpatient level of care appropriate due to severity of illness   Dispo:  Patient From: Home  Planned Disposition: Home  Expected discharge date: 07/14/20  Medically stable for discharge: No         I/O last 3  completed shifts: In: 365.4 [P.O.:240; I.V.:25.4; IV Piggyback:100] Out: 1700 [Urine:1700] No intake/output data recorded.     Consultants:   None  Procedures: None  Antimicrobials:  Rocephin  Subjective: Patient feels well today, able to ambulate without a signal short of breath.  On 4 L oxygen.  Cough, nonproductive. No fever chills pain No headache or dizziness. No abdominal pain or nausea vomiting, appetite is good, no diarrhea constipation. No dysuria or hematuria.  Objective: Vitals:   07/12/20 2034 07/12/20 2040 07/13/20 0401 07/13/20 0813  BP:   132/78 133/78  Pulse: 95  86 98  Resp: 18  18 20   Temp:   98.6 F (37 C)   TempSrc:      SpO2: 96% 96% 94% 94%  Weight:   105.2 kg   Height:        Intake/Output Summary (Last 24 hours) at 07/13/2020 1242 Last data filed at 07/13/2020 0646 Gross per 24 hour  Intake 125.39 ml  Output 1000 ml  Net -874.61 ml   Filed Weights   07/11/20 0500 07/12/20 0512 07/13/20 0401  Weight: 103.7 kg 105.3 kg 105.2 kg    Examination:  General exam: Appears calm and comfortable  Respiratory system: Clear to auscultation. Respiratory effort normal. Cardiovascular system: S1 & S2 heard, RRR. No JVD, murmurs, rubs, gallops or clicks. No pedal edema. Gastrointestinal system: Abdomen is nondistended, soft and nontender. No organomegaly or masses felt. Normal bowel sounds heard. Central nervous system: Alert and oriented. No focal neurological deficits. Extremities: Symmetric. Skin: No rashes, lesions or ulcers Psychiatry: Mood & affect appropriate.     Data Reviewed: I have personally reviewed following labs and imaging studies  CBC: Recent Labs  Lab  07/07/20 0440 07/09/20 0635 07/11/20 0512 07/12/20 0415 07/13/20 0408  WBC 18.4* 17.7* 18.9* 17.2* 17.6*  NEUTROABS  --  14.0* 14.5* 13.3* 13.5*  HGB 14.8 14.5 14.4 13.4 13.7  HCT 43.8 43.0 43.1 41.4 41.8  MCV 84.6 85.5 85.7 87.2 85.5  PLT 481* 351 314 257 371   Basic  Metabolic Panel: Recent Labs  Lab 07/09/20 0635 07/10/20 0454 07/11/20 0512 07/12/20 0415 07/13/20 0408  NA 130* 133* 131* 129* 131*  K 4.6 4.6 5.1 4.7 4.8  CL 91* 91* 90* 94* 97*  CO2 27 26 27 23 23   GLUCOSE 117* 160* 124* 171* 138*  BUN 31* 35* 30* 24* 23*  CREATININE 0.94 1.00 1.00 0.97 0.81  CALCIUM 8.4* 9.1 8.6* 8.8* 8.5*  MG  --   --  2.7* 2.6* 2.6*   GFR: Estimated Creatinine Clearance: 119.7 mL/min (by C-G formula based on SCr of 0.81 mg/dL). Liver Function Tests: Recent Labs  Lab 07/08/20 0428 07/09/20 0635 07/10/20 0454 07/11/20 0512 07/13/20 0408  AST 108* 188* 153* 163* 125*  ALT 281* 495* 496* 547* 549*  ALKPHOS 62 72 77 71 71  BILITOT 1.2 1.2 1.4* 1.4* 0.9  PROT 6.1* 5.9* 5.9* 5.8* 5.6*  ALBUMIN 3.2* 3.2* 3.1* 2.9* 2.9*   No results for input(s): LIPASE, AMYLASE in the last 168 hours. No results for input(s): AMMONIA in the last 168 hours. Coagulation Profile: No results for input(s): INR, PROTIME in the last 168 hours. Cardiac Enzymes: No results for input(s): CKTOTAL, CKMB, CKMBINDEX, TROPONINI in the last 168 hours. BNP (last 3 results) No results for input(s): PROBNP in the last 8760 hours. HbA1C: No results for input(s): HGBA1C in the last 72 hours. CBG: No results for input(s): GLUCAP in the last 168 hours. Lipid Profile: No results for input(s): CHOL, HDL, LDLCALC, TRIG, CHOLHDL, LDLDIRECT in the last 72 hours. Thyroid Function Tests: No results for input(s): TSH, T4TOTAL, FREET4, T3FREE, THYROIDAB in the last 72 hours. Anemia Panel: No results for input(s): VITAMINB12, FOLATE, FERRITIN, TIBC, IRON, RETICCTPCT in the last 72 hours. Sepsis Labs: Recent Labs  Lab 07/11/20 0512  PROCALCITON 0.30    No results found for this or any previous visit (from the past 240 hour(s)).       Radiology Studies: No results found.      Scheduled Meds: . acidophilus  2 capsule Oral TID  . aspirin EC  81 mg Oral Daily  .  dextromethorphan-guaiFENesin  1 tablet Oral BID  . enoxaparin (LOVENOX) injection  0.5 mg/kg Subcutaneous Q24H  . feeding supplement  237 mL Oral TID BM  . influenza vac split quadrivalent PF  0.5 mL Intramuscular Tomorrow-1000  . Ipratropium-Albuterol  1 puff Inhalation Q6H  . methylPREDNISolone (SOLU-MEDROL) injection  40 mg Intravenous Q12H  . mometasone-formoterol  2 puff Inhalation BID  . multivitamin with minerals  1 tablet Oral Daily  . pantoprazole  40 mg Oral Daily  . pneumococcal 23 valent vaccine  0.5 mL Intramuscular Tomorrow-1000  . sodium chloride  1 g Oral BID WC  . traZODone  50 mg Oral QHS   Continuous Infusions: . sodium chloride 10 mL/hr at 07/12/20 1540  . cefTRIAXone (ROCEPHIN)  IV Stopped (07/12/20 1452)     LOS: 18 days    Time spent: 28 minutes    Sharen Hones, MD [facility]   To contact the attending provider between 7A-7P or the covering provider during after hours 7P-7A, please log into the web site [email or web address] and access  using universal [place] password for that web site. If you do not have the password, please call the hospital operator.  07/13/2020, 12:42 PM

## 2020-07-14 DIAGNOSIS — R7401 Elevation of levels of liver transaminase levels: Secondary | ICD-10-CM | POA: Diagnosis not present

## 2020-07-14 DIAGNOSIS — J1282 Pneumonia due to coronavirus disease 2019: Secondary | ICD-10-CM | POA: Diagnosis not present

## 2020-07-14 DIAGNOSIS — J9601 Acute respiratory failure with hypoxia: Secondary | ICD-10-CM | POA: Diagnosis not present

## 2020-07-14 DIAGNOSIS — U071 COVID-19: Secondary | ICD-10-CM | POA: Diagnosis not present

## 2020-07-14 LAB — COMPREHENSIVE METABOLIC PANEL
ALT: 537 U/L — ABNORMAL HIGH (ref 0–44)
AST: 123 U/L — ABNORMAL HIGH (ref 15–41)
Albumin: 2.9 g/dL — ABNORMAL LOW (ref 3.5–5.0)
Alkaline Phosphatase: 77 U/L (ref 38–126)
Anion gap: 8 (ref 5–15)
BUN: 21 mg/dL — ABNORMAL HIGH (ref 6–20)
CO2: 25 mmol/L (ref 22–32)
Calcium: 8.8 mg/dL — ABNORMAL LOW (ref 8.9–10.3)
Chloride: 99 mmol/L (ref 98–111)
Creatinine, Ser: 0.85 mg/dL (ref 0.61–1.24)
GFR, Estimated: >60 mL/min (ref >60)
Glucose, Bld: 118 mg/dL — ABNORMAL HIGH (ref 70–99)
Potassium: 4.7 mmol/L (ref 3.5–5.1)
Sodium: 132 mmol/L — ABNORMAL LOW (ref 135–145)
Total Bilirubin: 0.9 mg/dL (ref 0.3–1.2)
Total Protein: 5.8 g/dL — ABNORMAL LOW (ref 6.5–8.1)

## 2020-07-14 LAB — CBC WITH DIFFERENTIAL/PLATELET
Abs Immature Granulocytes: 1.57 10*3/uL — ABNORMAL HIGH (ref 0.00–0.07)
Basophils Absolute: 0.1 10*3/uL (ref 0.0–0.1)
Basophils Relative: 1 %
Eosinophils Absolute: 0 10*3/uL (ref 0.0–0.5)
Eosinophils Relative: 0 %
HCT: 42 % (ref 39.0–52.0)
Hemoglobin: 13.7 g/dL (ref 13.0–17.0)
Immature Granulocytes: 9 %
Lymphocytes Relative: 9 %
Lymphs Abs: 1.6 10*3/uL (ref 0.7–4.0)
MCH: 28.4 pg (ref 26.0–34.0)
MCHC: 32.6 g/dL (ref 30.0–36.0)
MCV: 87 fL (ref 80.0–100.0)
Monocytes Absolute: 0.9 10*3/uL (ref 0.1–1.0)
Monocytes Relative: 5 %
Neutro Abs: 12.8 10*3/uL — ABNORMAL HIGH (ref 1.7–7.7)
Neutrophils Relative %: 76 %
Platelets: 201 10*3/uL (ref 150–400)
RBC: 4.83 MIL/uL (ref 4.22–5.81)
RDW: 15.7 % — ABNORMAL HIGH (ref 11.5–15.5)
Smear Review: NORMAL
WBC: 17 10*3/uL — ABNORMAL HIGH (ref 4.0–10.5)
nRBC: 0 /100 WBC
nRBC: 0.4 % — ABNORMAL HIGH (ref 0.0–0.2)

## 2020-07-14 LAB — MAGNESIUM: Magnesium: 2.4 mg/dL (ref 1.7–2.4)

## 2020-07-14 LAB — PHOSPHORUS: Phosphorus: 3.6 mg/dL (ref 2.5–4.6)

## 2020-07-14 MED ORDER — SODIUM CHLORIDE 1 G PO TABS: 1.0000 g | ORAL_TABLET | Freq: Two times a day (BID) | ORAL | 0 refills | Status: AC

## 2020-07-14 MED ORDER — VITAMIN D 25 MCG (1000 UNIT) PO TABS: 1000.0000 [IU] | ORAL_TABLET | Freq: Every day | ORAL | 0 refills | Status: AC

## 2020-07-14 MED ORDER — ZINC 220 (50 ZN) MG PO CAPS: 1.0000 | ORAL_CAPSULE | Freq: Every day | ORAL | 0 refills | Status: DC

## 2020-07-14 MED ORDER — PREDNISONE 10 MG PO TABS: ORAL_TABLET | ORAL | 0 refills | Status: AC

## 2020-07-14 MED ORDER — VITAMIN C 250 MG PO TABS: 500.0000 mg | ORAL_TABLET | Freq: Every day | ORAL | 0 refills | Status: AC

## 2020-07-14 NOTE — Discharge Summary (Signed)
Physician Discharge Summary  Patient ID: Derek Cook MRN: [id number] DOB/AGE: 57-Apr-1962 57 y.o.  Admit date: 06/25/2020 Discharge date: 07/14/2020  Admission Diagnoses:  Discharge Diagnoses:  Principal Problem:   Pneumonia due to COVID-19 virus Active Problems:   Hyperlipidemia   GERD (gastroesophageal reflux disease)   Acute respiratory failure with hypoxia (HCC)   SOB (shortness of breath)   Elevated liver transaminase level   Hyponatremia   Discharged Condition: good  Hospital Course: Derek Cook medical history significant forCOPD, mild persistent asthma, GERD,who presented to the ED on 10/19/2021with 1 week history of progressive shortness of breath, nonproductive cough, increased fatigue. Recent travel to [place], no known sick Covid contacts, but is unvaccinated. Admitted for acute respiratory failure with hypoxia due to severe Covid-19 pneumonia.Patient has completed remdesivir. Still on steroids. He also received furosemide for 6 days. On baricitinib since 10/21. Discontinued on 11/1 due to liver function changes.  #1.  Acute hypoxemic respiratory failure, Covid pneumonia, secondary bacterial pneumonia. Condition much improved, currently patient is on 2-1/2 L oxygen.  He was able to walk inside the room without desaturation or shortness of breath.  He has completed antibiotics, he has no evidence of volume overload.  At this time, patient is medically stable to be discharged.  We will set up home care and home physical therapy.  Also set up home oxygen, he will need a 2 to 3 L oxygen for short period time, he was also advised to follow-up with his family doctor within a week.  2.  Liver function changes secondary to Covid. Finally improving.  3.  Hyponatremia. 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  Euvolemic.  Continue salt tablet.    Will continue 1 week of salt tablets.  Diet without salt restriction until this  resolved.  Consults: None  Significant Diagnostic Studies:   CT ANGIOGRAPHY CHEST WITH CONTRAST  TECHNIQUE: Multidetector CT imaging of the chest was performed using the standard protocol during bolus administration of intravenous contrast. Multiplanar CT image reconstructions and MIPs were obtained to evaluate the vascular anatomy.  CONTRAST:  60mL OMNIPAQUE IOHEXOL 350 MG/ML SOLN  COMPARISON:  None.  FINDINGS: Cardiovascular: Satisfactory opacification of the pulmonary arteries to the segmental level. No evidence of pulmonary embolism. Normal heart size. No pericardial effusion.  Mediastinum/Nodes: No enlarged mediastinal, hilar, or axillary lymph nodes. Thyroid gland, trachea, and esophagus demonstrate no significant findings.  Lungs/Pleura: No pneumothorax or pleural effusion is noted. Multiple patchy airspace opacities are noted throughout both lungs most consistent with multifocal pneumonia due to COVID-19.  Upper Abdomen: Hepatic steatosis is noted.  Musculoskeletal: No chest wall abnormality. No acute or significant osseous findings.  Review of the MIP images confirms the above findings.  IMPRESSION: 1. No definite evidence of pulmonary embolus. 2. Multiple patchy airspace opacities are noted throughout both lungs most consistent with multifocal pneumonia due to COVID-19. 3. Hepatic steatosis.   Electronically Signed   By: Marijo Conception M.D.   On: 06/28/2020 12:25   Result History     Treatments: Antibiotics, steroids, remdesivir, baricitinib.  Discharge Exam: Blood pressure 132/75, pulse 79, temperature 98.6 F (37 C), temperature source Oral, resp. rate 14, height 5\' 8"  (1.727 m), weight 105.1 kg, SpO2 96 %. General appearance: alert and cooperative Resp: clear to auscultation bilaterally Cardio: regular rate and rhythm, S1, S2 normal, no murmur, click, rub or gallop GI: soft, non-tender; bowel sounds normal; no masses,  no  organomegaly Extremities: extremities normal, atraumatic,  no cyanosis or edema  Disposition: Discharge disposition: 01-Home or Self Care       Discharge Instructions    Diet general   Complete by: As directed    Low fat diet, no restriction on salt due to low sodium level   Increase activity slowly   Complete by: As directed      Allergies as of 07/14/2020      Reactions   Statins Other (See Comments)   myalgias   Azithromycin Hives   Shellfish-derived Products    Sulfa Antibiotics       Medication List    TAKE these medications   albuterol 108 (90 Base) MCG/ACT inhaler Commonly known as: VENTOLIN HFA INHALE 2 PUFFS BY MOUTH EVERY 4 TO 6 HOURS AS NEEDED WHEEZING/ SHORTNESS OF BREATH What changed: See the new instructions.   aspirin EC 81 MG tablet Take 162 mg by mouth daily.   benzonatate 100 MG capsule Commonly known as: TESSALON Take 1 capsule (100 mg total) by mouth 3 (three) times daily as needed for cough.   cholecalciferol 25 MCG (1000 UNIT) tablet Commonly known as: VITAMIN D3 Take 1 tablet (1,000 Units total) by mouth daily for 7 days.   Fluticasone-Salmeterol 500-50 MCG/DOSE Aepb Commonly known as: ADVAIR Inhale 1 puff into the lungs 2 (two) times daily.   omeprazole 40 MG capsule Commonly known as: PRILOSEC TAKE 1 CAPSULE BY MOUTH ONCE DAILY   predniSONE 10 MG tablet Commonly known as: DELTASONE Take 4 tablets (40 mg total) by mouth daily for 2 days, THEN 2 tablets (20 mg total) daily for 2 days, THEN 1 tablet (10 mg total) daily for 2 days. Start taking on: July 14, 2020   sodium chloride 1 g tablet Take 1 tablet (1 g total) by mouth 2 (two) times daily with a meal for 7 days.   vitamin C 250 MG tablet Commonly known as: ASCORBIC ACID Take 2 tablets (500 mg total) by mouth daily for 7 days.   Zinc 220 (50 Zn) MG Caps Take 1 capsule by mouth daily.            Durable Medical Equipment  (From admission, onward)         Start      Ordered   07/14/20 0905  For home use only DME oxygen  Once       Question Answer Comment  Length of Need 6 Months   Mode or (Route) Nasal cannula   Liters per Minute 3   Frequency Continuous (stationary and portable oxygen unit needed)   Oxygen conserving device Yes   Oxygen delivery system Gas      07/14/20 0905          Follow-up Information    Olin Hauser, DO Follow up.   Specialty: Family Medicine Contact information: [address] [place] [address] [phone]              36 minutes Signed: Sharen Hones 07/14/2020, 9:12 AM

## 2020-07-14 NOTE — Discharge Instructions (Signed)
10 Things You Can Do to Manage Your COVID-19 Symptoms at Home If you have possible or confirmed COVID-19: 1. Stay home from work and school. And stay away from other public places. If you must go out, avoid using any kind of public transportation, ridesharing, or taxis. 2. Monitor your symptoms carefully. If your symptoms get worse, call your healthcare provider immediately. 3. Get rest and stay hydrated. 4. If you have a medical appointment, call the healthcare provider ahead of time and tell them that you have or may have COVID-19. 5. For medical emergencies, call 911 and notify the dispatch personnel that you have or may have COVID-19. 6. Cover your cough and sneezes with a tissue or use the inside of your elbow. 7. Wash your hands often with soap and water for at least 20 seconds or clean your hands with an alcohol-based hand sanitizer that contains at least 60% alcohol. 8. As much as possible, stay in a specific room and away from other people in your home. Also, you should use a separate bathroom, if available. If you need to be around other people in or outside of the home, wear a mask. 9. Avoid sharing personal items with other people in your household, like dishes, towels, and bedding. 10. Clean all surfaces that are touched often, like counters, tabletops, and doorknobs. Use household cleaning sprays or wipes according to the label instructions. cdc.gov/coronavirus 03/08/2019 This information is not intended to replace advice given to you by your health care provider. Make sure you discuss any questions you have with your health care provider. Document Revised: 08/10/2019 Document Reviewed: 08/10/2019 Elsevier Patient Education  2020 Elsevier Inc.  

## 2020-07-14 NOTE — Plan of Care (Signed)

## 2020-07-14 NOTE — Progress Notes (Signed)
Patient discharged per orders. VSS at time of discharge. PIV and tele monitor removed from patient. AVS reviewed; pt expressed understanding. Patient taken to main entrance via wheelchair by nursing staff.

## 2020-07-14 NOTE — Progress Notes (Signed)
SATURATION QUALIFICATIONS: (This note is used to comply with regulatory documentation for home oxygen)  Patient Saturations on Room Air at Rest = 88%   Please briefly explain why patient needs home oxygen:  Patient has required oxygen throughout the duration of this admission. Patient desats to 88% without oxygen at rest.

## 2020-07-14 NOTE — TOC Transition Note (Signed)
Transition of Care [facility]) - CM/SW Discharge Note   Patient Details  Name: Derek Cook MRN: [id number] Date of Birth: 1960/10/04  [facility] CM/SW Contact:  Meriel Flavors, LCSW Phone Number: 07/14/2020, 10:58 AM   Clinical Narrative:    CSW spoke with patient's wife to discuss discharge plan. CSW explained options for DME and HH/PT. [name] stated they do not have any preferences. CSW set patient up with [facility] for HH/PT, spoke to [place]. CSW referred patient to [facility] for DME, Oxygen will be delivered to room.         Patient Goals and CMS Choice        Discharge Placement                       Discharge Plan and Services                                     Social Determinants of Health (SDOH) Interventions     Readmission Risk Interventions No flowsheet data found.

## 2020-07-15 ENCOUNTER — Telehealth: Payer: Self-pay

## 2020-07-15 NOTE — Telephone Encounter (Signed)
For home health / PT orders - this should have been ordered at time of discharge.  May need to call the hospital to see if they have arranged this and placed all orders - sometimes there is a delay from the [facility] due to staffing.  For hospital follow-up would usually start with Virtual Visit to check in and then can return to in office if needed for oxygen requirement.  [name], DO [facility] 07/15/2020, 3:37 PM

## 2020-07-15 NOTE — Telephone Encounter (Signed)
Spoke with the patient he was asking for PT and other service offered by hospital but has not heard   here is summery from hospital,  "#1. Acute hypoxemic respiratory failure, Covid pneumonia, secondary bacterial pneumonia. Condition much improved, currently patient is on 2-1/2 L oxygen.  He was able to walk inside the room without desaturation or shortness of breath.  He has completed antibiotics, he has no evidence of volume overload.  At this time, patient is medically stable to be discharged.  We will set up home care and home physical therapy.  Also set up home oxygen, he will need a 2 to 3 L oxygen for short period time, he was also advised to follow-up with his family doctor within a week.  Advised him that he does need hospital follow up with Dr Raliegh Ip for other treatment option he is weak but able to get up and go to the bathroom will not go to work for right now but wanted to be seen in the office --I suggested virtual is good since he is feeling weak but will send message to Dr Raliegh Ip.

## 2020-07-15 NOTE — Telephone Encounter (Signed)
Copied from [place] [phone]. Topic: General - Other >> Jul 15, 2020 10:37 AM [name] wrote: Patient stated that he has been discharged from hospital from covid and would like to know what Dr. Raliegh Ip would advise he do next. Patient did not want to book virtual hospital follow up. Please advise

## 2020-07-16 NOTE — Telephone Encounter (Signed)
Case manager from [facility] had informed that cheryl from [facility] [phone] is helping patient for home PT they just got referral yesterday and they will reach out to the patient today and [facility] will be helping patient for home oxygen set up also inform patient that they will be contacting him soon, he still feels little weak and with ROM his pulse ox drops to 88-89 %.

## 2020-07-22 ENCOUNTER — Other Ambulatory Visit: Payer: Self-pay

## 2020-07-22 ENCOUNTER — Telehealth (INDEPENDENT_AMBULATORY_CARE_PROVIDER_SITE_OTHER): Payer: PRIVATE HEALTH INSURANCE | Admitting: Family Medicine

## 2020-07-22 ENCOUNTER — Encounter: Payer: Self-pay | Admitting: Family Medicine

## 2020-07-22 DIAGNOSIS — J9601 Acute respiratory failure with hypoxia: Secondary | ICD-10-CM

## 2020-07-22 DIAGNOSIS — J1282 Pneumonia due to coronavirus disease 2019: Secondary | ICD-10-CM | POA: Diagnosis not present

## 2020-07-22 DIAGNOSIS — U071 COVID-19: Secondary | ICD-10-CM

## 2020-07-22 NOTE — Patient Instructions (Addendum)
Please schedule office visit with me for 6 min walk test for oxygen testing and weight check  Keep up with home health therapy.  Please schedule a Follow-up Appointment to: Return in about 1 week (around 07/29/2020) for 6 min walk test for oxygen (discontinue), post-covid (cleared).  If you have any other questions or concerns, please feel free to call the office or send a message through [place]. You may also schedule an earlier appointment if necessary.  Additionally, you may be receiving a survey about your experience at our office within a few days to 1 week by e-mail or mail. We value your feedback.  [name], DO [place]

## 2020-07-22 NOTE — Progress Notes (Signed)
Virtual Visit via Telephone The purpose of this virtual visit is to provide medical care while limiting exposure to the novel coronavirus (COVID19) for both patient and office staff.  Consent was obtained for phone visit:  Yes.   Answered questions that patient had about telehealth interaction:  Yes.   I discussed the limitations, risks, security and privacy concerns of performing an evaluation and management service by telephone. I also discussed with the patient that there may be a patient responsible charge related to this service. The patient expressed understanding and agreed to proceed.  Patient Location: Home Provider Location: [facility])  ---------------------------------------------------------------------- Chief Complaint  Patient presents with  . Hospitalization Follow-up    Covid positive, weak, Chills but improving --brain fog after Covid    S: Reviewed CMA documentation. I have called patient and gathered additional HPI as follows:  HOSPITAL FOLLOW-UP VISIT  Hospital/Location: [facility] Date of Admission: 06/25/20 Date of Discharge: 07/14/20 Transitions of care telephone call: not completed  Reason for Admission: COVID, Pneumonia  [facility] H&P and Discharge Summary have been reviewed - Patient presents today about 1 week after recent hospitalization.   Gradual improving - Weakness generalized. Difficulty with hands writing. He has upcoming PT OT scheduled for [facility]  - Today reports overall has done well after discharge. Symptoms of breathing have improved. Cough resolved.  He admits temp remains 44F range but he has had sweats regularly. Admits an odor to that sweat. He has gradually improved his vision and improved his balance. Currently oxygen saturation has been 91-93% he has not used oxygen as much.  He has supplemental oxygen currently not always wearing. But asking about walk test to determine if still need oxygen  - New  medications on discharge: Finished Prednisone  Admits sweats Denies any fevers, body ache, cough, shortness of breath, sinus pain or pressure, headache, abdominal pain, diarrhea  Past Medical History:  Diagnosis Date  . Allergy   . Asthma   . Colon polyp   . GERD (gastroesophageal reflux disease)   . Hyperlipidemia    Social History   Tobacco Use  . Smoking status: Former Smoker    Quit date: 09/07/2009    Years since quitting: 10.8  . Smokeless tobacco: Former Network engineer  . Vaping Use: Never used  Substance Use Topics  . Alcohol use: No  . Drug use: No    Current Outpatient Medications:  .  albuterol (VENTOLIN HFA) 108 (90 Base) MCG/ACT inhaler, INHALE 2 PUFFS BY MOUTH EVERY 4 TO 6 HOURS AS NEEDED WHEEZING/ SHORTNESS OF BREATH (Patient taking differently: Inhale 2 puffs into the lungs every 4 (four) hours as needed for wheezing or shortness of breath. ), Disp: 8.5 g, Rfl: 2 .  aspirin EC 81 MG tablet, Take 162 mg by mouth daily. , Disp: , Rfl:  .  benzonatate (TESSALON) 100 MG capsule, Take 1 capsule (100 mg total) by mouth 3 (three) times daily as needed for cough., Disp: 30 capsule, Rfl: 0 .  Fluticasone-Salmeterol (ADVAIR) 500-50 MCG/DOSE AEPB, Inhale 1 puff into the lungs 2 (two) times daily., Disp: 60 each, Rfl: 5 .  omeprazole (PRILOSEC) 40 MG capsule, TAKE 1 CAPSULE BY MOUTH ONCE DAILY (Patient taking differently: Take 40 mg by mouth daily. ), Disp: 90 capsule, Rfl: 1 .  Zinc 220 (50 Zn) MG CAPS, Take 1 capsule by mouth daily., Disp: 7 capsule, Rfl: 0  Depression screen [facility] 2/9 07/22/2020 08/10/2018 07/04/2018  Decreased Interest  0 0 0  Down, Depressed, Hopeless 0 0 0  PHQ - 2 Score 0 0 0    No flowsheet data found.  -------------------------------------------------------------------------- O: No physical exam performed due to remote telephone encounter.  Lab results reviewed.  Recent Results (from the past 2160 hour(s))  SARS CORONAVIRUS 2 (TAT 6-24 HRS)  Nasopharyngeal Nasopharyngeal Swab     Status: Abnormal   Collection Time: 06/21/20 11:36 AM   Specimen: Nasopharyngeal Swab  Result Value Ref Range   SARS Coronavirus 2 POSITIVE (A) NEGATIVE    Comment: Emailed Melissa Browning @ 2131 on 06/21/2020 by Braxton Feathers (NOTE) SARS-CoV-2 target nucleic acids are DETECTED.  The SARS-CoV-2 RNA is generally detectable in upper and lower respiratory specimens during the acute phase of infection. Positive results are indicative of the presence of SARS-CoV-2 RNA. Clinical correlation with patient history and other diagnostic information is  necessary to determine patient infection status. Positive results do not rule out bacterial infection or co-infection with other viruses.  The expected result is Negative.  Fact Sheet for Patients: SugarRoll.be  Fact Sheet for Healthcare Providers: https://www.woods-mathews.com/  This test is not yet approved or cleared by the [place] FDA and  has been authorized for detection and/or diagnosis of SARS-CoV-2 by FDA under an Emergency Use Authorization (EUA). This EUA will remain  in effect (meaning this test can be used) for the duration of the COV ID-19 declaration under Section 564(b)(1) of the Act, 21 U.S.C. section 360bbb-3(b)(1), unless the authorization is terminated or revoked sooner.   Performed at [facility], [place] [address] [place], [place] [address]   Rapid Influenza A&B Antigens     Status: None   Collection Time: 06/21/20 11:36 AM   Specimen: Flu Kit Nasopharyngeal Swab; Respiratory  Result Value Ref Range   Influenza A ([facility]) NEGATIVE NEGATIVE   Influenza B ([facility]) NEGATIVE NEGATIVE    Comment: Negative results do not exclude influenza virus infection, and influenza should still be considered if clinical suspicion is high. Performed at [facility], [address], [place], [place] [address]   Urinalysis, Complete w  Microscopic     Status: Abnormal   Collection Time: 06/25/20  5:34 AM  Result Value Ref Range   Color, Urine YELLOW (A) YELLOW   APPearance CLEAR (A) CLEAR   Specific Gravity, Urine 1.019 1.005 - 1.030   pH 6.0 5.0 - 8.0   Glucose, UA NEGATIVE NEGATIVE mg/dL   Hgb urine dipstick NEGATIVE NEGATIVE   Bilirubin Urine NEGATIVE NEGATIVE   Ketones, ur 20 (A) NEGATIVE mg/dL   Protein, ur 30 (A) NEGATIVE mg/dL   Nitrite NEGATIVE NEGATIVE   Leukocytes,Ua NEGATIVE NEGATIVE   RBC / HPF 0-5 0 - 5 RBC/hpf   WBC, UA 0-5 0 - 5 WBC/hpf   Bacteria, UA NONE SEEN NONE SEEN   Squamous Epithelial / LPF NONE SEEN 0 - 5   Mucus PRESENT    Hyaline Casts, UA PRESENT     Comment: Performed at [facility], [address] [place], [place] [address]  CBC with Differential     Status: None   Collection Time: 06/25/20  2:38 PM  Result Value Ref Range   WBC 7.5 4.0 - 10.5 K/uL   RBC 5.29 4.22 - 5.81 MIL/uL   Hemoglobin 14.7 13.0 - 17.0 g/dL   HCT 43.2 39 - 52 %   MCV 81.7 80.0 - 100.0 fL   MCH 27.8 26.0 - 34.0 pg   MCHC  34.0 30.0 - 36.0 g/dL   RDW 14.6 11.5 - 15.5 %   Platelets 242 150 - 400 K/uL   nRBC 0.0 0.0 - 0.2 %   Neutrophils Relative % 85 %   Neutro Abs 6.4 1.7 - 7.7 K/uL   Lymphocytes Relative 9 %   Lymphs Abs 0.7 0.7 - 4.0 K/uL   Monocytes Relative 6 %   Monocytes Absolute 0.4 0.1 - 1.0 K/uL   Eosinophils Relative 0 %   Eosinophils Absolute 0.0 0.0 - 0.5 K/uL   Basophils Relative 0 %   Basophils Absolute 0.0 0.0 - 0.1 K/uL   Immature Granulocytes 0 %   Abs Immature Granulocytes 0.02 0.00 - 0.07 K/uL    Comment: Performed at [facility], [address] [place], [place] [address]  Comprehensive metabolic panel     Status: Abnormal   Collection Time: 06/25/20  2:38 PM  Result Value Ref Range   Sodium 134 (L) 135 - 145 mmol/L   Potassium 4.2 3.5 - 5.1 mmol/L   Chloride 100 98 - 111 mmol/L   CO2 22 22 - 32 mmol/L   Glucose, Bld 120 (H) 70 - 99 mg/dL    Comment:  Glucose reference range applies only to samples taken after fasting for at least 8 hours.   BUN 20 6 - 20 mg/dL   Creatinine, Ser 0.93 0.61 - 1.24 mg/dL   Calcium 8.6 (L) 8.9 - 10.3 mg/dL   Total Protein 7.5 6.5 - 8.1 g/dL   Albumin 3.4 (L) 3.5 - 5.0 g/dL   AST 129 (H) 15 - 41 U/L   ALT 75 (H) 0 - 44 U/L   Alkaline Phosphatase 57 38 - 126 U/L   Total Bilirubin 1.1 0.3 - 1.2 mg/dL   GFR, Estimated >60 >60 mL/min   Anion gap 12 5 - 15    Comment: Performed at [facility], [address] [place], [place] [address]  Lipase, blood     Status: None   Collection Time: 06/25/20  2:38 PM  Result Value Ref Range   Lipase 29 11 - 51 U/L    Comment: Performed at [facility], [place], [place] [address]  Troponin I (High Sensitivity)     Status: None   Collection Time: 06/25/20  2:38 PM  Result Value Ref Range   Troponin I (High Sensitivity) 11 <18 ng/L    Comment: (NOTE) Elevated high sensitivity troponin I (hsTnI) values and significant  changes across serial measurements may suggest ACS but many other  chronic and acute conditions are known to elevate hsTnI results.  Refer to the "Links" section for chest pain algorithms and additional  guidance. Performed at [facility], [address] [place], [place] [address]   Lactic acid, plasma     Status: None   Collection Time: 06/25/20  2:38 PM  Result Value Ref Range   Lactic Acid, Venous 1.8 0.5 - 1.9 mmol/L    Comment: Performed at [facility], [address] [place], [place] [address]  Magnesium     Status: None   Collection Time: 06/25/20  2:38 PM  Result Value Ref Range   Magnesium 2.4 1.7 - 2.4 mg/dL    Comment: Performed at [facility], [address] [place], [place] [address]  Blood gas, arterial     Status: Abnormal   Collection Time: 06/26/20  5:00 AM  Result Value Ref Range   FIO2 0.40  Delivery systems NASAL CANNULA    pH, Arterial 7.42 7.35 - 7.45    pCO2 arterial 33 32 - 48 mmHg   pO2, Arterial 63 (L) 83 - 108 mmHg   Bicarbonate 21.4 20.0 - 28.0 mmol/L   Acid-base deficit 2.2 (H) 0.0 - 2.0 mmol/L   O2 Saturation 92.2 %   Patient temperature 37.0    Collection site RIGHT RADIAL    Sample type ARTERIAL DRAW    Allens test (pass/fail) PASS PASS    Comment: Performed at [facility], [address] [place], [place] [address]  HIV Antibody (routine testing w rflx)     Status: None   Collection Time: 06/26/20  5:34 AM  Result Value Ref Range   HIV Screen 4th Generation wRfx Non Reactive Non Reactive    Comment: Performed at [facility] [address] [place], [place] [address]  Comprehensive metabolic panel     Status: Abnormal   Collection Time: 06/26/20  5:34 AM  Result Value Ref Range   Sodium 138 135 - 145 mmol/L   Potassium 4.1 3.5 - 5.1 mmol/L   Chloride 103 98 - 111 mmol/L   CO2 22 22 - 32 mmol/L   Glucose, Bld 147 (H) 70 - 99 mg/dL    Comment: Glucose reference range applies only to samples taken after fasting for at least 8 hours.   BUN 21 (H) 6 - 20 mg/dL   Creatinine, Ser 0.98 0.61 - 1.24 mg/dL   Calcium 8.7 (L) 8.9 - 10.3 mg/dL   Total Protein 6.7 6.5 - 8.1 g/dL   Albumin 3.2 (L) 3.5 - 5.0 g/dL   AST 105 (H) 15 - 41 U/L   ALT 70 (H) 0 - 44 U/L   Alkaline Phosphatase 60 38 - 126 U/L   Total Bilirubin 0.9 0.3 - 1.2 mg/dL   GFR, Estimated >60 >60 mL/min   Anion gap 13 5 - 15    Comment: Performed at [facility], [address] [place], [place] [address]  CBC WITH DIFFERENTIAL     Status: Abnormal   Collection Time: 06/26/20  5:34 AM  Result Value Ref Range   WBC 3.8 (L) 4.0 - 10.5 K/uL   RBC 5.06 4.22 - 5.81 MIL/uL   Hemoglobin 14.2 13.0 - 17.0 g/dL   HCT 42.6 39 - 52 %   MCV 84.2 80.0 - 100.0 fL   MCH 28.1 26.0 - 34.0 pg   MCHC 33.3 30.0 - 36.0 g/dL   RDW 14.7 11.5 - 15.5 %   Platelets 222 150 - 400 K/uL   nRBC 0.0 0.0 - 0.2 %   Neutrophils Relative % 77 %   Neutro Abs 3.0  1.7 - 7.7 K/uL   Lymphocytes Relative 15 %   Lymphs Abs 0.6 (L) 0.7 - 4.0 K/uL   Monocytes Relative 7 %   Monocytes Absolute 0.3 0.1 - 1.0 K/uL   Eosinophils Relative 0 %   Eosinophils Absolute 0.0 0.0 - 0.5 K/uL   Basophils Relative 0 %   Basophils Absolute 0.0 0.0 - 0.1 K/uL   Immature Granulocytes 1 %   Abs Immature Granulocytes 0.02 0.00 - 0.07 K/uL    Comment: Performed at [facility], [address] [place], [place] [address]  Phosphorus     Status: None   Collection Time: 06/26/20  5:34 AM  Result Value Ref Range   Phosphorus 4.2 2.5 - 4.6 mg/dL    Comment: Performed at [facility], [address] [place], [place] [address]  Ferritin     Status: Abnormal   Collection Time: 06/26/20  5:34 AM  Result Value Ref Range   Ferritin 1,626 (H) 24 - 336 ng/mL    Comment: Performed at [facility], [address] [place], [place] [address]  Fibrinogen     Status: Abnormal   Collection Time: 06/26/20  5:34 AM  Result Value Ref Range   Fibrinogen 726 (H) 210 - 475 mg/dL    Comment: Performed at [facility], [address] [place], [place] [address]  Lactate dehydrogenase     Status: Abnormal   Collection Time: 06/26/20  5:34 AM  Result Value Ref Range   LDH 459 (H) 98 - 192 U/L    Comment: Performed at [facility], [address], [place], [place] [address]  Magnesium     Status: Abnormal   Collection Time: 06/26/20  5:34 AM  Result Value Ref Range   Magnesium 2.5 (H) 1.7 - 2.4 mg/dL    Comment: Performed at [facility], [address] [place], [place] [address]  C-reactive protein     Status: Abnormal   Collection Time: 06/26/20  9:20 AM  Result Value Ref Range   CRP 17.3 (H) <1.0 mg/dL    Comment: Performed at [facility], [place] [address], [place], [place] [address]  Procalcitonin - Baseline     Status: None   Collection Time: 06/26/20  9:22 AM  Result Value Ref Range   Procalcitonin 0.63 ng/mL     Comment:        Interpretation: PCT > 0.5 ng/mL and <= 2 ng/mL: Systemic infection (sepsis) is possible, but other conditions are known to elevate PCT as well. (NOTE)       Sepsis PCT Algorithm           Lower Respiratory Tract                                      Infection PCT Algorithm    ----------------------------     ----------------------------         PCT < 0.25 ng/mL                PCT < 0.10 ng/mL          Strongly encourage             Strongly discourage   discontinuation of antibiotics    initiation of antibiotics    ----------------------------     -----------------------------       PCT 0.25 - 0.50 ng/mL            PCT 0.10 - 0.25 ng/mL               OR       >80% decrease in PCT            Discourage initiation of                                            antibiotics      Encourage discontinuation           of antibiotics    ----------------------------     -----------------------------         PCT >=  0.50 ng/mL              PCT 0.26 - 0.50 ng/mL                AND       <80% decrease in PCT             Encourage initiation of                                             antibiotics       Encourage continuation           of antibiotics    ----------------------------     -----------------------------        PCT >= 0.50 ng/mL                  PCT > 0.50 ng/mL               AND         increase in PCT                  Strongly encourage                                      initiation of antibiotics    Strongly encourage escalation           of antibiotics                                     -----------------------------                                           PCT <= 0.25 ng/mL                                                 OR                                        > 80% decrease in PCT                                      Discontinue / Do not initiate                                             antibiotics  Performed at [facility], [address] [place], [place] [address]   Glucose, capillary     Status: Abnormal   Collection Time: 06/26/20 11:24 PM  Result Value Ref Range   Glucose-Capillary 128 (H) 70 - 99 mg/dL    Comment: Glucose reference range applies only to samples taken after fasting for at least 8 hours.  C-reactive protein  Status: Abnormal   Collection Time: 06/27/20  5:09 AM  Result Value Ref Range   CRP 8.4 (H) <1.0 mg/dL    Comment: Performed at [facility] [address], [place], [place] [address]  Procalcitonin     Status: None   Collection Time: 06/27/20  5:09 AM  Result Value Ref Range   Procalcitonin 0.51 ng/mL    Comment:        Interpretation: PCT > 0.5 ng/mL and <= 2 ng/mL: Systemic infection (sepsis) is possible, but other conditions are known to elevate PCT as well. (NOTE)       Sepsis PCT Algorithm           Lower Respiratory Tract                                      Infection PCT Algorithm    ----------------------------     ----------------------------         PCT < 0.25 ng/mL                PCT < 0.10 ng/mL          Strongly encourage             Strongly discourage   discontinuation of antibiotics    initiation of antibiotics    ----------------------------     -----------------------------       PCT 0.25 - 0.50 ng/mL            PCT 0.10 - 0.25 ng/mL               OR       >80% decrease in PCT            Discourage initiation of                                            antibiotics      Encourage discontinuation           of antibiotics    ----------------------------     -----------------------------         PCT >= 0.50 ng/mL              PCT 0.26 - 0.50 ng/mL                AND       <80% decrease in PCT             Encourage initiation of                                             antibiotics       Encourage continuation           of antibiotics    ----------------------------     -----------------------------        PCT >= 0.50 ng/mL                  PCT >  0.50 ng/mL               AND         increase in PCT  Strongly encourage                                      initiation of antibiotics    Strongly encourage escalation           of antibiotics                                     -----------------------------                                           PCT <= 0.25 ng/mL                                                 OR                                        > 80% decrease in PCT                                      Discontinue / Do not initiate                                             antibiotics  Performed at [facility], [address] [place], [place] [address]   CBC     Status: None   Collection Time: 06/27/20  5:09 AM  Result Value Ref Range   WBC 8.4 4.0 - 10.5 K/uL   RBC 4.87 4.22 - 5.81 MIL/uL   Hemoglobin 13.9 13.0 - 17.0 g/dL   HCT 41.3 39 - 52 %   MCV 84.8 80.0 - 100.0 fL   MCH 28.5 26.0 - 34.0 pg   MCHC 33.7 30.0 - 36.0 g/dL   RDW 14.5 11.5 - 15.5 %   Platelets 299 150 - 400 K/uL   nRBC 0.0 0.0 - 0.2 %    Comment: Performed at [facility] [address], [place], [place] [address]  Comprehensive metabolic panel     Status: Abnormal   Collection Time: 06/27/20  5:09 AM  Result Value Ref Range   Sodium 138 135 - 145 mmol/L   Potassium 4.5 3.5 - 5.1 mmol/L   Chloride 105 98 - 111 mmol/L   CO2 20 (L) 22 - 32 mmol/L   Glucose, Bld 134 (H) 70 - 99 mg/dL    Comment: Glucose reference range applies only to samples taken after fasting for at least 8 hours.   BUN 21 (H) 6 - 20 mg/dL   Creatinine, Ser 0.74 0.61 - 1.24 mg/dL   Calcium 8.3 (L) 8.9 - 10.3 mg/dL   Total Protein 6.5 6.5 - 8.1 g/dL   Albumin 3.0 (L) 3.5 - 5.0 g/dL   AST 75 (H) 15 - 41 U/L   ALT 72 (H) 0 -  44 U/L   Alkaline Phosphatase 63 38 - 126 U/L   Total Bilirubin 0.9 0.3 - 1.2 mg/dL   GFR, Estimated >60 >60 mL/min   Anion gap 13 5 - 15    Comment: Performed at [facility], [address]  [place], [place] [address]  Magnesium     Status: Abnormal   Collection Time: 06/27/20  5:09 AM  Result Value Ref Range   Magnesium 2.6 (H) 1.7 - 2.4 mg/dL    Comment: Performed at [facility], [address] [place], [place] [address]  Fibrin derivatives D-Dimer [facility] only)     Status: Abnormal   Collection Time: 06/27/20  5:09 AM  Result Value Ref Range   Fibrin derivatives D-dimer ([facility]) 3,157.14 (H) 0.00 - 499.00 ng/mL (FEU)    Comment: (NOTE) <> Exclusion of Venous Thromboembolism (VTE) - OUTPATIENT ONLY   (Emergency Department or [place])    0-499 ng/ml (FEU): With a low to intermediate pretest probability                      for VTE this test result excludes the diagnosis                      of VTE.   >499 ng/ml (FEU) : VTE not excluded; additional work up for VTE is                      required.  <> Testing on Inpatients and Evaluation of Disseminated Intravascular   Coagulation (DIC) Reference Range:   0-499 ng/ml (FEU) Performed at [facility], [address] [place], [place] [address]   C-reactive protein     Status: Abnormal   Collection Time: 06/28/20  6:41 AM  Result Value Ref Range   CRP 3.2 (H) <1.0 mg/dL    Comment: Performed at [facility] [address], [place],  [address]  Procalcitonin     Status: None   Collection Time: 06/28/20  6:41 AM  Result Value Ref Range   Procalcitonin 0.25 ng/mL    Comment:        Interpretation: PCT (Procalcitonin) <= 0.5 ng/mL: Systemic infection (sepsis) is not likely. Local bacterial infection is possible. (NOTE)       Sepsis PCT Algorithm           Lower Respiratory Tract                                      Infection PCT Algorithm    ----------------------------     ----------------------------         PCT < 0.25 ng/mL                PCT < 0.10 ng/mL          Strongly encourage             Strongly discourage   discontinuation of antibiotics    initiation of antibiotics     ----------------------------     -----------------------------       PCT 0.25 - 0.50 ng/mL            PCT 0.10 - 0.25 ng/mL               OR       >80% decrease in PCT  Discourage initiation of                                            antibiotics      Encourage discontinuation           of antibiotics    ----------------------------     -----------------------------         PCT >= 0.50 ng/mL              PCT 0.26 - 0.50 ng/mL               AND        <80% decrease in PCT             Encourage initiation of                                             antibiotics       Encourage continuation           of antibiotics    ----------------------------     -----------------------------        PCT >= 0.50 ng/mL                  PCT > 0.50 ng/mL               AND         increase in PCT                  Strongly encourage                                      initiation of antibiotics    Strongly encourage escalation           of antibiotics                                     -----------------------------                                           PCT <= 0.25 ng/mL                                                 OR                                        > 80% decrease in PCT                                      Discontinue / Do not initiate  antibiotics  Performed at [facility], [address] [place], [place] [address]   Comprehensive metabolic panel     Status: Abnormal   Collection Time: 06/28/20  6:41 AM  Result Value Ref Range   Sodium 136 135 - 145 mmol/L   Potassium 4.4 3.5 - 5.1 mmol/L   Chloride 104 98 - 111 mmol/L   CO2 22 22 - 32 mmol/L   Glucose, Bld 135 (H) 70 - 99 mg/dL    Comment: Glucose reference range applies only to samples taken after fasting for at least 8 hours.   BUN 25 (H) 6 - 20 mg/dL   Creatinine, Ser 0.80 0.61 - 1.24 mg/dL   Calcium 8.5 (L) 8.9 - 10.3 mg/dL   Total Protein 6.4 (L) 6.5 -  8.1 g/dL   Albumin 3.0 (L) 3.5 - 5.0 g/dL   AST 46 (H) 15 - 41 U/L   ALT 69 (H) 0 - 44 U/L   Alkaline Phosphatase 64 38 - 126 U/L   Total Bilirubin 1.0 0.3 - 1.2 mg/dL   GFR, Estimated >60 >60 mL/min    Comment: (NOTE) Calculated using the CKD-EPI Creatinine Equation (2021)    Anion gap 10 5 - 15    Comment: Performed at [facility], [address], [place], [place] [address]  Fibrin derivatives D-Dimer [facility] only)     Status: Abnormal   Collection Time: 06/28/20  6:41 AM  Result Value Ref Range   Fibrin derivatives D-dimer ([facility]) >7,500.00 (H) 0.00 - 499.00 ng/mL (FEU)    Comment: (NOTE) <> Exclusion of Venous Thromboembolism (VTE) - OUTPATIENT ONLY   (Emergency Department or [place])    0-499 ng/ml (FEU): With a low to intermediate pretest probability                      for VTE this test result excludes the diagnosis                      of VTE.   >499 ng/ml (FEU) : VTE not excluded; additional work up for VTE is                      required.  <> Testing on Inpatients and Evaluation of Disseminated Intravascular   Coagulation (DIC) Reference Range:   0-499 ng/ml (FEU) Performed at [facility], [address] [place], [place] [address]   C-reactive protein     Status: Abnormal   Collection Time: 06/29/20  3:54 AM  Result Value Ref Range   CRP 1.3 (H) <1.0 mg/dL    Comment: Performed at [place] [address] [place], [place] [address]  Comprehensive metabolic panel     Status: Abnormal   Collection Time: 06/29/20  3:54 AM  Result Value Ref Range   Sodium 134 (L) 135 - 145 mmol/L   Potassium 4.6 3.5 - 5.1 mmol/L   Chloride 103 98 - 111 mmol/L   CO2 23 22 - 32 mmol/L   Glucose, Bld 160 (H) 70 - 99 mg/dL    Comment: Glucose reference range applies only to samples taken after fasting for at least 8 hours.   BUN 26 (H) 6 - 20 mg/dL   Creatinine, Ser 0.94 0.61 - 1.24 mg/dL   Calcium 8.3 (L) 8.9 - 10.3 mg/dL   Total Protein 6.1 (L) 6.5 -  8.1 g/dL   Albumin 3.0 (L) 3.5 - 5.0 g/dL  AST 42 (H) 15 - 41 U/L   ALT 69 (H) 0 - 44 U/L   Alkaline Phosphatase 57 38 - 126 U/L   Total Bilirubin 0.9 0.3 - 1.2 mg/dL   GFR, Estimated >60 >60 mL/min    Comment: (NOTE) Calculated using the CKD-EPI Creatinine Equation (2021)    Anion gap 8 5 - 15    Comment: Performed at [facility], [address] [place], [place] [address]  Fibrin derivatives D-Dimer [facility] only)     Status: Abnormal   Collection Time: 06/29/20  3:54 AM  Result Value Ref Range   Fibrin derivatives D-dimer ([facility]) 5,539.67 (H) 0.00 - 499.00 ng/mL (FEU)    Comment: (NOTE) <> Exclusion of Venous Thromboembolism (VTE) - OUTPATIENT ONLY   (Emergency Department or [place])    0-499 ng/ml (FEU): With a low to intermediate pretest probability                      for VTE this test result excludes the diagnosis                      of VTE.   >499 ng/ml (FEU) : VTE not excluded; additional work up for VTE is                      required.  <> Testing on Inpatients and Evaluation of Disseminated Intravascular   Coagulation (DIC) Reference Range:   0-499 ng/ml (FEU) Performed at [facility], [address] [place], [place] [address]   C-reactive protein     Status: None   Collection Time: 06/30/20  4:46 AM  Result Value Ref Range   CRP 0.6 <1.0 mg/dL    Comment: Performed at [place] [address] [place], [place] [address]  Comprehensive metabolic panel     Status: Abnormal   Collection Time: 06/30/20  4:47 AM  Result Value Ref Range   Sodium 134 (L) 135 - 145 mmol/L   Potassium 4.7 3.5 - 5.1 mmol/L   Chloride 103 98 - 111 mmol/L   CO2 24 22 - 32 mmol/L   Glucose, Bld 148 (H) 70 - 99 mg/dL    Comment: Glucose reference range applies only to samples taken after fasting for at least 8 hours.   BUN 25 (H) 6 - 20 mg/dL   Creatinine, Ser 0.88 0.61 - 1.24 mg/dL   Calcium 8.4 (L) 8.9 - 10.3 mg/dL   Total Protein 6.1 (L) 6.5 - 8.1 g/dL    Albumin 3.1 (L) 3.5 - 5.0 g/dL   AST 43 (H) 15 - 41 U/L   ALT 81 (H) 0 - 44 U/L   Alkaline Phosphatase 55 38 - 126 U/L   Total Bilirubin 1.0 0.3 - 1.2 mg/dL   GFR, Estimated >60 >60 mL/min    Comment: (NOTE) Calculated using the CKD-EPI Creatinine Equation (2021)    Anion gap 7 5 - 15    Comment: Performed at [facility], [address] [place], [place] [address]  Fibrin derivatives D-Dimer [facility] only)     Status: Abnormal   Collection Time: 06/30/20  4:47 AM  Result Value Ref Range   Fibrin derivatives D-dimer ([facility]) 3,263.67 (H) 0.00 - 499.00 ng/mL (FEU)    Comment: (NOTE) <> Exclusion of Venous Thromboembolism (VTE) - OUTPATIENT ONLY   (Emergency Department or [place])    0-499 ng/ml (FEU): With a low to intermediate pretest probability  for VTE this test result excludes the diagnosis                      of VTE.   >499 ng/ml (FEU) : VTE not excluded; additional work up for VTE is                      required.  <> Testing on Inpatients and Evaluation of Disseminated Intravascular   Coagulation (DIC) Reference Range:   0-499 ng/ml (FEU) Performed at [facility], [address] [place], [place] [address]   C-reactive protein     Status: None   Collection Time: 07/01/20  7:07 AM  Result Value Ref Range   CRP 0.5 <1.0 mg/dL    Comment: Performed at [place] [address] [place], [place] [address]  Comprehensive metabolic panel     Status: Abnormal   Collection Time: 07/01/20  7:07 AM  Result Value Ref Range   Sodium 132 (L) 135 - 145 mmol/L   Potassium 4.4 3.5 - 5.1 mmol/L   Chloride 100 98 - 111 mmol/L   CO2 23 22 - 32 mmol/L   Glucose, Bld 126 (H) 70 - 99 mg/dL    Comment: Glucose reference range applies only to samples taken after fasting for at least 8 hours.   BUN 26 (H) 6 - 20 mg/dL   Creatinine, Ser 1.07 0.61 - 1.24 mg/dL   Calcium 8.8 (L) 8.9 - 10.3 mg/dL   Total Protein 6.4 (L) 6.5 - 8.1 g/dL    Albumin 3.2 (L) 3.5 - 5.0 g/dL   AST 37 15 - 41 U/L   ALT 85 (H) 0 - 44 U/L   Alkaline Phosphatase 55 38 - 126 U/L   Total Bilirubin 1.0 0.3 - 1.2 mg/dL   GFR, Estimated >60 >60 mL/min    Comment: (NOTE) Calculated using the CKD-EPI Creatinine Equation (2021)    Anion gap 9 5 - 15    Comment: Performed at [facility], [address] [place], [place] [address]  Fibrin derivatives D-Dimer [facility] only)     Status: Abnormal   Collection Time: 07/01/20  7:07 AM  Result Value Ref Range   Fibrin derivatives D-dimer ([facility]) 2,335.87 (H) 0.00 - 499.00 ng/mL (FEU)    Comment: (NOTE) <> Exclusion of Venous Thromboembolism (VTE) - OUTPATIENT ONLY   (Emergency Department or [place])    0-499 ng/ml (FEU): With a low to intermediate pretest probability                      for VTE this test result excludes the diagnosis                      of VTE.   >499 ng/ml (FEU) : VTE not excluded; additional work up for VTE is                      required.  <> Testing on Inpatients and Evaluation of Disseminated Intravascular   Coagulation (DIC) Reference Range:   0-499 ng/ml (FEU) Performed at [facility], [address] [place], [place] [address]   C-reactive protein     Status: Abnormal   Collection Time: 07/02/20  4:56 AM  Result Value Ref Range   CRP 1.7 (H) <1.0 mg/dL    Comment: Performed at [place] [address], [place], [place] [address]  Comprehensive  metabolic panel     Status: Abnormal   Collection Time: 07/02/20  4:56 AM  Result Value Ref Range   Sodium 133 (L) 135 - 145 mmol/L   Potassium 4.5 3.5 - 5.1 mmol/L   Chloride 103 98 - 111 mmol/L   CO2 21 (L) 22 - 32 mmol/L   Glucose, Bld 143 (H) 70 - 99 mg/dL    Comment: Glucose reference range applies only to samples taken after fasting for at least 8 hours.   BUN 24 (H) 6 - 20 mg/dL   Creatinine, Ser 0.90 0.61 - 1.24 mg/dL   Calcium 8.5 (L) 8.9 - 10.3 mg/dL   Total Protein 6.2 (L) 6.5 - 8.1 g/dL    Albumin 3.1 (L) 3.5 - 5.0 g/dL   AST 41 15 - 41 U/L   ALT 92 (H) 0 - 44 U/L   Alkaline Phosphatase 62 38 - 126 U/L   Total Bilirubin 1.0 0.3 - 1.2 mg/dL   GFR, Estimated >60 >60 mL/min    Comment: (NOTE) Calculated using the CKD-EPI Creatinine Equation (2021)    Anion gap 9 5 - 15    Comment: Performed at [facility], [address], [place], [place] [address]  Fibrin derivatives D-Dimer [facility] only)     Status: Abnormal   Collection Time: 07/02/20  4:56 AM  Result Value Ref Range   Fibrin derivatives D-dimer ([facility]) 1,997.30 (H) 0.00 - 499.00 ng/mL (FEU)    Comment: (NOTE) <> Exclusion of Venous Thromboembolism (VTE) - OUTPATIENT ONLY   (Emergency Department or [place])    0-499 ng/ml (FEU): With a low to intermediate pretest probability                      for VTE this test result excludes the diagnosis                      of VTE.   >499 ng/ml (FEU) : VTE not excluded; additional work up for VTE is                      required.  <> Testing on Inpatients and Evaluation of Disseminated Intravascular   Coagulation (DIC) Reference Range:   0-499 ng/ml (FEU) Performed at [facility], [address] [place], [place] [address]   CBC     Status: Abnormal   Collection Time: 07/02/20  4:56 AM  Result Value Ref Range   WBC 14.3 (H) 4.0 - 10.5 K/uL   RBC 5.29 4.22 - 5.81 MIL/uL   Hemoglobin 14.9 13.0 - 17.0 g/dL   HCT 44.3 39 - 52 %   MCV 83.7 80.0 - 100.0 fL   MCH 28.2 26.0 - 34.0 pg   MCHC 33.6 30.0 - 36.0 g/dL   RDW 14.2 11.5 - 15.5 %   Platelets 590 (H) 150 - 400 K/uL   nRBC 0.0 0.0 - 0.2 %    Comment: Performed at [facility], [address] [place], [place] [address]  C-reactive protein     Status: None   Collection Time: 07/03/20  6:05 AM  Result Value Ref Range   CRP 0.5 <1.0 mg/dL    Comment: Performed at [place] [address], [place], [place] [address]  Comprehensive metabolic panel     Status: Abnormal   Collection  Time: 07/03/20  6:05 AM  Result Value Ref Range   Sodium 132 (L) 135 - 145  mmol/L   Potassium 4.4 3.5 - 5.1 mmol/L   Chloride 101 98 - 111 mmol/L   CO2 23 22 - 32 mmol/L   Glucose, Bld 114 (H) 70 - 99 mg/dL    Comment: Glucose reference range applies only to samples taken after fasting for at least 8 hours.   BUN 22 (H) 6 - 20 mg/dL   Creatinine, Ser 0.74 0.61 - 1.24 mg/dL   Calcium 8.5 (L) 8.9 - 10.3 mg/dL   Total Protein 6.1 (L) 6.5 - 8.1 g/dL   Albumin 3.1 (L) 3.5 - 5.0 g/dL   AST 34 15 - 41 U/L   ALT 90 (H) 0 - 44 U/L   Alkaline Phosphatase 50 38 - 126 U/L   Total Bilirubin 0.8 0.3 - 1.2 mg/dL   GFR, Estimated >60 >60 mL/min    Comment: (NOTE) Calculated using the CKD-EPI Creatinine Equation (2021)    Anion gap 8 5 - 15    Comment: Performed at [facility], [address] [place], [place] [address]  Fibrin derivatives D-Dimer [facility] only)     Status: Abnormal   Collection Time: 07/03/20  6:05 AM  Result Value Ref Range   Fibrin derivatives D-dimer ([facility]) 1,462.84 (H) 0.00 - 499.00 ng/mL (FEU)    Comment: (NOTE) <> Exclusion of Venous Thromboembolism (VTE) - OUTPATIENT ONLY   (Emergency Department or [place])    0-499 ng/ml (FEU): With a low to intermediate pretest probability                      for VTE this test result excludes the diagnosis                      of VTE.   >499 ng/ml (FEU) : VTE not excluded; additional work up for VTE is                      required.  <> Testing on Inpatients and Evaluation of Disseminated Intravascular   Coagulation (DIC) Reference Range:   0-499 ng/ml (FEU) Performed at [facility], [address] [place], [place] [address]   Comprehensive metabolic panel     Status: Abnormal   Collection Time: 07/04/20  3:36 AM  Result Value Ref Range   Sodium 131 (L) 135 - 145 mmol/L   Potassium 4.6 3.5 - 5.1 mmol/L    Comment: HEMOLYSIS AT THIS LEVEL MAY AFFECT RESULT   Chloride 98 98 - 111 mmol/L   CO2 23 22 - 32  mmol/L   Glucose, Bld 134 (H) 70 - 99 mg/dL    Comment: Glucose reference range applies only to samples taken after fasting for at least 8 hours.   BUN 27 (H) 6 - 20 mg/dL   Creatinine, Ser 0.95 0.61 - 1.24 mg/dL   Calcium 8.5 (L) 8.9 - 10.3 mg/dL   Total Protein 6.3 (L) 6.5 - 8.1 g/dL   Albumin 3.2 (L) 3.5 - 5.0 g/dL   AST 43 (H) 15 - 41 U/L   ALT 98 (H) 0 - 44 U/L   Alkaline Phosphatase 58 38 - 126 U/L   Total Bilirubin 1.1 0.3 - 1.2 mg/dL   GFR, Estimated >60 >60 mL/min    Comment: (NOTE) Calculated using the CKD-EPI Creatinine Equation (2021)    Anion gap 10 5 - 15    Comment: Performed at [facility], [address] [place], [place] [address]  Fibrin derivatives D-Dimer (  [place] only)     Status: Abnormal   Collection Time: 07/04/20  3:36 AM  Result Value Ref Range   Fibrin derivatives D-dimer ([facility]) 1,263.46 (H) 0.00 - 499.00 ng/mL (FEU)    Comment: (NOTE) <> Exclusion of Venous Thromboembolism (VTE) - OUTPATIENT ONLY   (Emergency Department or [place])    0-499 ng/ml (FEU): With a low to intermediate pretest probability                      for VTE this test result excludes the diagnosis                      of VTE.   >499 ng/ml (FEU) : VTE not excluded; additional work up for VTE is                      required.  <> Testing on Inpatients and Evaluation of Disseminated Intravascular   Coagulation (DIC) Reference Range:   0-499 ng/ml (FEU) Performed at [facility], [address] [place], [place] [address]   Comprehensive metabolic panel     Status: Abnormal   Collection Time: 07/05/20  5:17 AM  Result Value Ref Range   Sodium 131 (L) 135 - 145 mmol/L   Potassium 4.9 3.5 - 5.1 mmol/L    Comment: HEMOLYSIS AT THIS LEVEL MAY AFFECT RESULT   Chloride 98 98 - 111 mmol/L   CO2 24 22 - 32 mmol/L   Glucose, Bld 143 (H) 70 - 99 mg/dL    Comment: Glucose reference range applies only to samples taken after fasting for at least 8 hours.   BUN 26 (H) 6 -  20 mg/dL   Creatinine, Ser 0.98 0.61 - 1.24 mg/dL   Calcium 8.8 (L) 8.9 - 10.3 mg/dL   Total Protein 6.4 (L) 6.5 - 8.1 g/dL   Albumin 3.3 (L) 3.5 - 5.0 g/dL   AST 42 (H) 15 - 41 U/L   ALT 106 (H) 0 - 44 U/L   Alkaline Phosphatase 61 38 - 126 U/L   Total Bilirubin 1.3 (H) 0.3 - 1.2 mg/dL   GFR, Estimated >60 >60 mL/min    Comment: (NOTE) Calculated using the CKD-EPI Creatinine Equation (2021)    Anion gap 9 5 - 15    Comment: Performed at [facility], [address] [place], [place] [address]  Fibrin derivatives D-Dimer [facility] only)     Status: Abnormal   Collection Time: 07/05/20  5:17 AM  Result Value Ref Range   Fibrin derivatives D-dimer ([facility]) 1,085.87 (H) 0.00 - 499.00 ng/mL (FEU)    Comment: (NOTE) <> Exclusion of Venous Thromboembolism (VTE) - OUTPATIENT ONLY   (Emergency Department or [place])    0-499 ng/ml (FEU): With a low to intermediate pretest probability                      for VTE this test result excludes the diagnosis                      of VTE.   >499 ng/ml (FEU) : VTE not excluded; additional work up for VTE is                      required.  <> Testing on Inpatients and Evaluation of Disseminated Intravascular   Coagulation (DIC) Reference Range:   0-499 ng/ml (FEU) Performed at [facility],  [place], [place] [address]   CBC     Status: Abnormal   Collection Time: 07/05/20  5:17 AM  Result Value Ref Range   WBC 18.4 (H) 4.0 - 10.5 K/uL   RBC 5.31 4.22 - 5.81 MIL/uL   Hemoglobin 15.2 13.0 - 17.0 g/dL   HCT 44.5 39 - 52 %   MCV 83.8 80.0 - 100.0 fL   MCH 28.6 26.0 - 34.0 pg   MCHC 34.2 30.0 - 36.0 g/dL   RDW 14.4 11.5 - 15.5 %   Platelets 557 (H) 150 - 400 K/uL   nRBC 0.1 0.0 - 0.2 %    Comment: Performed at [facility], [address] [place], [place] [address]  Comprehensive metabolic panel     Status: Abnormal   Collection Time: 07/06/20  7:51 AM  Result Value Ref Range   Sodium 130 (L) 135 -  145 mmol/L   Potassium 4.8 3.5 - 5.1 mmol/L    Comment: HEMOLYSIS AT THIS LEVEL MAY AFFECT RESULT   Chloride 94 (L) 98 - 111 mmol/L   CO2 26 22 - 32 mmol/L   Glucose, Bld 137 (H) 70 - 99 mg/dL    Comment: Glucose reference range applies only to samples taken after fasting for at least 8 hours.   BUN 29 (H) 6 - 20 mg/dL   Creatinine, Ser 0.93 0.61 - 1.24 mg/dL   Calcium 8.9 8.9 - 10.3 mg/dL   Total Protein 6.1 (L) 6.5 - 8.1 g/dL   Albumin 3.2 (L) 3.5 - 5.0 g/dL   AST 54 (H) 15 - 41 U/L   ALT 143 (H) 0 - 44 U/L   Alkaline Phosphatase 61 38 - 126 U/L   Total Bilirubin 1.0 0.3 - 1.2 mg/dL   GFR, Estimated >60 >60 mL/min    Comment: (NOTE) Calculated using the CKD-EPI Creatinine Equation (2021)    Anion gap 10 5 - 15    Comment: Performed at [facility], [address] [place], [place] [address]  Magnesium     Status: Abnormal   Collection Time: 07/06/20  7:51 AM  Result Value Ref Range   Magnesium 2.6 (H) 1.7 - 2.4 mg/dL    Comment: Performed at [facility], [address] [place], [place] [address]  Comprehensive metabolic panel     Status: Abnormal   Collection Time: 07/07/20  4:40 AM  Result Value Ref Range   Sodium 133 (L) 135 - 145 mmol/L   Potassium 4.4 3.5 - 5.1 mmol/L   Chloride 96 (L) 98 - 111 mmol/L   CO2 25 22 - 32 mmol/L   Glucose, Bld 145 (H) 70 - 99 mg/dL    Comment: Glucose reference range applies only to samples taken after fasting for at least 8 hours.   BUN 33 (H) 6 - 20 mg/dL   Creatinine, Ser 1.09 0.61 - 1.24 mg/dL   Calcium 8.5 (L) 8.9 - 10.3 mg/dL   Total Protein 5.8 (L) 6.5 - 8.1 g/dL   Albumin 3.1 (L) 3.5 - 5.0 g/dL   AST 73 (H) 15 - 41 U/L   ALT 179 (H) 0 - 44 U/L   Alkaline Phosphatase 63 38 - 126 U/L   Total Bilirubin 1.1 0.3 - 1.2 mg/dL   GFR, Estimated >60 >60 mL/min    Comment: (NOTE) Calculated using the CKD-EPI Creatinine Equation (2021)    Anion gap 12 5 - 15    Comment: Performed at [facility], [address] [place], [place] [address]  CBC     Status: Abnormal   Collection Time: 07/07/20  4:40 AM  Result Value Ref Range   WBC 18.4 (H) 4.0 - 10.5 K/uL   RBC 5.18 4.22 - 5.81 MIL/uL   Hemoglobin 14.8 13.0 - 17.0 g/dL   HCT 43.8 39 - 52 %   MCV 84.6 80.0 - 100.0 fL   MCH 28.6 26.0 - 34.0 pg   MCHC 33.8 30.0 - 36.0 g/dL   RDW 14.5 11.5 - 15.5 %   Platelets 481 (H) 150 - 400 K/uL   nRBC 0.0 0.0 - 0.2 %    Comment: Performed at [facility], [address] [place], [place] [address]  Comprehensive metabolic panel     Status: Abnormal   Collection Time: 07/08/20  4:28 AM  Result Value Ref Range   Sodium 132 (L) 135 - 145 mmol/L   Potassium 4.6 3.5 - 5.1 mmol/L   Chloride 93 (L) 98 - 111 mmol/L   CO2 27 22 - 32 mmol/L   Glucose, Bld 132 (H) 70 - 99 mg/dL    Comment: Glucose reference range applies only to samples taken after fasting for at least 8 hours.   BUN 33 (H) 6 - 20 mg/dL   Creatinine, Ser 1.01 0.61 - 1.24 mg/dL   Calcium 8.9 8.9 - 10.3 mg/dL   Total Protein 6.1 (L) 6.5 - 8.1 g/dL   Albumin 3.2 (L) 3.5 - 5.0 g/dL   AST 108 (H) 15 - 41 U/L   ALT 281 (H) 0 - 44 U/L   Alkaline Phosphatase 62 38 - 126 U/L   Total Bilirubin 1.2 0.3 - 1.2 mg/dL   GFR, Estimated >60 >60 mL/min    Comment: (NOTE) Calculated using the CKD-EPI Creatinine Equation (2021)    Anion gap 12 5 - 15    Comment: Performed at [facility], [address] [place], [place] [address]  Comprehensive metabolic panel     Status: Abnormal   Collection Time: 07/09/20  6:35 AM  Result Value Ref Range   Sodium 130 (L) 135 - 145 mmol/L   Potassium 4.6 3.5 - 5.1 mmol/L   Chloride 91 (L) 98 - 111 mmol/L   CO2 27 22 - 32 mmol/L   Glucose, Bld 117 (H) 70 - 99 mg/dL    Comment: Glucose reference range applies only to samples taken after fasting for at least 8 hours.   BUN 31 (H) 6 - 20 mg/dL   Creatinine, Ser 0.94 0.61 - 1.24 mg/dL   Calcium 8.4 (L) 8.9 - 10.3 mg/dL   Total Protein 5.9  (L) 6.5 - 8.1 g/dL   Albumin 3.2 (L) 3.5 - 5.0 g/dL   AST 188 (H) 15 - 41 U/L   ALT 495 (H) 0 - 44 U/L   Alkaline Phosphatase 72 38 - 126 U/L   Total Bilirubin 1.2 0.3 - 1.2 mg/dL   GFR, Estimated >60 >60 mL/min    Comment: (NOTE) Calculated using the CKD-EPI Creatinine Equation (2021)    Anion gap 12 5 - 15    Comment: Performed at [facility], [address] [place], [place] [address]  CBC with Differential/Platelet     Status: Abnormal   Collection Time: 07/09/20  6:35 AM  Result Value Ref Range   WBC 17.7 (H) 4.0 - 10.5 K/uL   RBC 5.03 4.22 - 5.81 MIL/uL   Hemoglobin 14.5 13.0 - 17.0 g/dL  HCT 43.0 39 - 52 %   MCV 85.5 80.0 - 100.0 fL   MCH 28.8 26.0 - 34.0 pg   MCHC 33.7 30.0 - 36.0 g/dL   RDW 14.8 11.5 - 15.5 %   Platelets 351 150 - 400 K/uL   nRBC 0.0 0.0 - 0.2 %   Neutrophils Relative % 79 %   Neutro Abs 14.0 (H) 1.7 - 7.7 K/uL   Lymphocytes Relative 6 %   Lymphs Abs 1.0 0.7 - 4.0 K/uL   Monocytes Relative 10 %   Monocytes Absolute 1.8 (H) 0.1 - 1.0 K/uL   Eosinophils Relative 0 %   Eosinophils Absolute 0.0 0.0 - 0.5 K/uL   Basophils Relative 0 %   Basophils Absolute 0.0 0.0 - 0.1 K/uL   Immature Granulocytes 5 %   Abs Immature Granulocytes 0.83 (H) 0.00 - 0.07 K/uL    Comment: Performed at [facility], [address] [place], [place] [address]  Comprehensive metabolic panel     Status: Abnormal   Collection Time: 07/10/20  4:54 AM  Result Value Ref Range   Sodium 133 (L) 135 - 145 mmol/L   Potassium 4.6 3.5 - 5.1 mmol/L    Comment: HEMOLYSIS AT THIS LEVEL MAY AFFECT RESULT   Chloride 91 (L) 98 - 111 mmol/L   CO2 26 22 - 32 mmol/L   Glucose, Bld 160 (H) 70 - 99 mg/dL    Comment: Glucose reference range applies only to samples taken after fasting for at least 8 hours.   BUN 35 (H) 6 - 20 mg/dL   Creatinine, Ser 1.00 0.61 - 1.24 mg/dL   Calcium 9.1 8.9 - 10.3 mg/dL   Total Protein 5.9 (L) 6.5 - 8.1 g/dL   Albumin 3.1 (L) 3.5 - 5.0  g/dL   AST 153 (H) 15 - 41 U/L    Comment: HEMOLYSIS AT THIS LEVEL MAY AFFECT RESULT   ALT 496 (H) 0 - 44 U/L   Alkaline Phosphatase 77 38 - 126 U/L   Total Bilirubin 1.4 (H) 0.3 - 1.2 mg/dL    Comment: HEMOLYSIS AT THIS LEVEL MAY AFFECT RESULT   GFR, Estimated >60 >60 mL/min    Comment: (NOTE) Calculated using the CKD-EPI Creatinine Equation (2021)    Anion gap 16 (H) 5 - 15    Comment: Performed at [facility], [address] [place], [place] [address]  Hepatitis panel, acute     Status: None   Collection Time: 07/10/20  4:54 AM  Result Value Ref Range   Hepatitis B Surface Ag NON REACTIVE NON REACTIVE   HCV Ab NON REACTIVE NON REACTIVE    Comment: (NOTE) Nonreactive HCV antibody screen is consistent with no HCV infections,  unless recent infection is suspected or other evidence exists to indicate HCV infection.     Hep A IgM NON REACTIVE NON REACTIVE   Hep B C IgM NON REACTIVE NON REACTIVE    Comment: Performed at [facility], [place] [address], [place], [place] [address]  Brain natriuretic peptide     Status: None   Collection Time: 07/11/20  5:11 AM  Result Value Ref Range   B Natriuretic Peptide 82.3 0.0 - 100.0 pg/mL    Comment: Performed at [facility], [address] [place], [place] [address]  Procalcitonin - Baseline     Status: None   Collection Time: 07/11/20  5:12 AM  Result Value Ref Range   Procalcitonin 0.30 ng/mL  Comment:        Interpretation: PCT (Procalcitonin) <= 0.5 ng/mL: Systemic infection (sepsis) is not likely. Local bacterial infection is possible. (NOTE)       Sepsis PCT Algorithm           Lower Respiratory Tract                                      Infection PCT Algorithm    ----------------------------     ----------------------------         PCT < 0.25 ng/mL                PCT < 0.10 ng/mL          Strongly encourage             Strongly discourage   discontinuation of antibiotics    initiation of  antibiotics    ----------------------------     -----------------------------       PCT 0.25 - 0.50 ng/mL            PCT 0.10 - 0.25 ng/mL               OR       >80% decrease in PCT            Discourage initiation of                                            antibiotics      Encourage discontinuation           of antibiotics    ----------------------------     -----------------------------         PCT >= 0.50 ng/mL              PCT 0.26 - 0.50 ng/mL               AND        <80% decrease in PCT             Encourage initiation of                                             antibiotics       Encourage continuation           of antibiotics    ----------------------------     -----------------------------        PCT >= 0.50 ng/mL                  PCT > 0.50 ng/mL               AND         increase in PCT                  Strongly encourage                                      initiation of antibiotics    Strongly encourage escalation           of antibiotics                                     -----------------------------  PCT <= 0.25 ng/mL                                                 OR                                        > 80% decrease in PCT                                      Discontinue / Do not initiate                                             antibiotics  Performed at [facility], [address] [place], [place] [address]   Basic metabolic panel     Status: Abnormal   Collection Time: 07/11/20  5:12 AM  Result Value Ref Range   Sodium 131 (L) 135 - 145 mmol/L   Potassium 5.1 3.5 - 5.1 mmol/L    Comment: HEMOLYSIS AT THIS LEVEL MAY AFFECT RESULT   Chloride 90 (L) 98 - 111 mmol/L   CO2 27 22 - 32 mmol/L   Glucose, Bld 124 (H) 70 - 99 mg/dL    Comment: Glucose reference range applies only to samples taken after fasting for at least 8 hours.   BUN 30 (H) 6 - 20 mg/dL   Creatinine, Ser 1.00 0.61 - 1.24 mg/dL    Calcium 8.6 (L) 8.9 - 10.3 mg/dL   GFR, Estimated >60 >60 mL/min    Comment: (NOTE) Calculated using the CKD-EPI Creatinine Equation (2021)    Anion gap 14 5 - 15    Comment: Performed at [facility], [address] [place], [place] [address]  Magnesium     Status: Abnormal   Collection Time: 07/11/20  5:12 AM  Result Value Ref Range   Magnesium 2.7 (H) 1.7 - 2.4 mg/dL    Comment: Performed at [facility], [address] [place], [place] [address]  CBC with Differential/Platelet     Status: Abnormal   Collection Time: 07/11/20  5:12 AM  Result Value Ref Range   WBC 18.9 (H) 4.0 - 10.5 K/uL   RBC 5.03 4.22 - 5.81 MIL/uL   Hemoglobin 14.4 13.0 - 17.0 g/dL   HCT 43.1 39 - 52 %   MCV 85.7 80.0 - 100.0 fL   MCH 28.6 26.0 - 34.0 pg   MCHC 33.4 30.0 - 36.0 g/dL   RDW 15.1 11.5 - 15.5 %   Platelets 314 150 - 400 K/uL   nRBC 0.0 0.0 - 0.2 %   Neutrophils Relative % 76 %   Neutro Abs 14.5 (H) 1.7 - 7.7 K/uL   Lymphocytes Relative 8 %   Lymphs Abs 1.5 0.7 - 4.0 K/uL   Monocytes Relative 9 %   Monocytes Absolute 1.6 (H) 0.1 - 1.0 K/uL   Eosinophils Relative 0 %   Eosinophils Absolute 0.0 0.0 - 0.5 K/uL   Basophils Relative 1 %   Basophils Absolute 0.1 0.0 - 0.1 K/uL   WBC Morphology MORPHOLOGY  UNREMARKABLE    RBC Morphology MORPHOLOGY UNREMARKABLE    Smear Review Normal platelet morphology    Immature Granulocytes 6 %   Abs Immature Granulocytes 1.15 (H) 0.00 - 0.07 K/uL    Comment: Performed at [facility], [address] [place], [place] [address]  Hepatic function panel     Status: Abnormal   Collection Time: 07/11/20  5:12 AM  Result Value Ref Range   Total Protein 5.8 (L) 6.5 - 8.1 g/dL   Albumin 2.9 (L) 3.5 - 5.0 g/dL   AST 163 (H) 15 - 41 U/L    Comment: HEMOLYSIS AT THIS LEVEL MAY AFFECT RESULT   ALT 547 (H) 0 - 44 U/L   Alkaline Phosphatase 71 38 - 126 U/L   Total Bilirubin 1.4 (H) 0.3 - 1.2 mg/dL    Comment: HEMOLYSIS AT THIS  LEVEL MAY AFFECT RESULT   Bilirubin, Direct 0.3 (H) 0.0 - 0.2 mg/dL    Comment: HEMOLYSIS AT THIS LEVEL MAY AFFECT RESULT   Indirect Bilirubin 1.1 (H) 0.3 - 0.9 mg/dL    Comment: Performed at [facility], [address] [place], [place] [address]  CBC with Differential/Platelet     Status: Abnormal   Collection Time: 07/12/20  4:15 AM  Result Value Ref Range   WBC 17.2 (H) 4.0 - 10.5 K/uL   RBC 4.75 4.22 - 5.81 MIL/uL   Hemoglobin 13.4 13.0 - 17.0 g/dL   HCT 41.4 39 - 52 %   MCV 87.2 80.0 - 100.0 fL   MCH 28.2 26.0 - 34.0 pg   MCHC 32.4 30.0 - 36.0 g/dL   RDW 15.0 11.5 - 15.5 %   Platelets 257 150 - 400 K/uL   nRBC 0.0 0.0 - 0.2 %   Neutrophils Relative % 77 %   Neutro Abs 13.3 (H) 1.7 - 7.7 K/uL   Lymphocytes Relative 7 %   Lymphs Abs 1.2 0.7 - 4.0 K/uL   Monocytes Relative 7 %   Monocytes Absolute 1.2 (H) 0.1 - 1.0 K/uL   Eosinophils Relative 0 %   Eosinophils Absolute 0.0 0.0 - 0.5 K/uL   Basophils Relative 1 %   Basophils Absolute 0.1 0.0 - 0.1 K/uL   WBC Morphology MORPHOLOGY UNREMARKABLE    RBC Morphology MORPHOLOGY UNREMARKABLE    Smear Review Normal platelet morphology    Immature Granulocytes 8 %   Abs Immature Granulocytes 1.40 (H) 0.00 - 0.07 K/uL    Comment: Performed at [facility], [address] [place], [place] [address]  Basic metabolic panel     Status: Abnormal   Collection Time: 07/12/20  4:15 AM  Result Value Ref Range   Sodium 129 (L) 135 - 145 mmol/L   Potassium 4.7 3.5 - 5.1 mmol/L    Comment: HEMOLYSIS AT THIS LEVEL MAY AFFECT RESULT   Chloride 94 (L) 98 - 111 mmol/L   CO2 23 22 - 32 mmol/L   Glucose, Bld 171 (H) 70 - 99 mg/dL    Comment: Glucose reference range applies only to samples taken after fasting for at least 8 hours.   BUN 24 (H) 6 - 20 mg/dL   Creatinine, Ser 0.97 0.61 - 1.24 mg/dL   Calcium 8.8 (L) 8.9 - 10.3 mg/dL   GFR, Estimated >60 >60 mL/min    Comment: (NOTE) Calculated using the CKD-EPI Creatinine  Equation (2021)    Anion gap 12 5 - 15    Comment: Performed at [facility], [address] [place], [place] [address]  Magnesium     Status: Abnormal   Collection Time: 07/12/20  4:15 AM  Result Value Ref Range   Magnesium 2.6 (H) 1.7 - 2.4 mg/dL    Comment: Performed at [facility], [address] [place], [place] [address]  CBC with Differential/Platelet     Status: Abnormal   Collection Time: 07/13/20  4:08 AM  Result Value Ref Range   WBC 17.6 (H) 4.0 - 10.5 K/uL   RBC 4.89 4.22 - 5.81 MIL/uL   Hemoglobin 13.7 13.0 - 17.0 g/dL   HCT 41.8 39 - 52 %   MCV 85.5 80.0 - 100.0 fL   MCH 28.0 26.0 - 34.0 pg   MCHC 32.8 30.0 - 36.0 g/dL   RDW 15.2 11.5 - 15.5 %   Platelets 225 150 - 400 K/uL   nRBC 0.1 0.0 - 0.2 %   Neutrophils Relative % 76 %   Neutro Abs 13.5 (H) 1.7 - 7.7 K/uL   Lymphocytes Relative 8 %   Lymphs Abs 1.4 0.7 - 4.0 K/uL   Monocytes Relative 6 %   Monocytes Absolute 1.0 0.1 - 1.0 K/uL   Eosinophils Relative 0 %   Eosinophils Absolute 0.0 0.0 - 0.5 K/uL   Basophils Relative 1 %   Basophils Absolute 0.1 0.0 - 0.1 K/uL   WBC Morphology MILD LEFT SHIFT (1-5% METAS, OCC MYELO, OCC BANDS)    RBC Morphology MORPHOLOGY UNREMARKABLE    Smear Review Normal platelet morphology    Immature Granulocytes 9 %   Abs Immature Granulocytes 1.60 (H) 0.00 - 0.07 K/uL    Comment: Performed at [facility], [address], [place], [place] [address]  Basic metabolic panel     Status: Abnormal   Collection Time: 07/13/20  4:08 AM  Result Value Ref Range   Sodium 131 (L) 135 - 145 mmol/L   Potassium 4.8 3.5 - 5.1 mmol/L   Chloride 97 (L) 98 - 111 mmol/L   CO2 23 22 - 32 mmol/L   Glucose, Bld 138 (H) 70 - 99 mg/dL    Comment: Glucose reference range applies only to samples taken after fasting for at least 8 hours.   BUN 23 (H) 6 - 20 mg/dL   Creatinine, Ser 0.81 0.61 - 1.24 mg/dL   Calcium 8.5 (L) 8.9 - 10.3 mg/dL   GFR, Estimated >60 >60 mL/min     Comment: (NOTE) Calculated using the CKD-EPI Creatinine Equation (2021)    Anion gap 11 5 - 15    Comment: Performed at [facility], [address] [place], [place] [address]  Magnesium     Status: Abnormal   Collection Time: 07/13/20  4:08 AM  Result Value Ref Range   Magnesium 2.6 (H) 1.7 - 2.4 mg/dL    Comment: Performed at [facility], [address] [place], [place] [address]  Hepatic function panel     Status: Abnormal   Collection Time: 07/13/20  4:08 AM  Result Value Ref Range   Total Protein 5.6 (L) 6.5 - 8.1 g/dL   Albumin 2.9 (L) 3.5 - 5.0 g/dL   AST 125 (H) 15 - 41 U/L   ALT 549 (H) 0 - 44 U/L   Alkaline Phosphatase 71 38 - 126 U/L   Total Bilirubin 0.9 0.3 - 1.2 mg/dL   Bilirubin, Direct 0.2 0.0 - 0.2 mg/dL   Indirect Bilirubin 0.7 0.3 - 0.9 mg/dL    Comment: Performed at  [facility], [address], [place], [place] [address]  Comprehensive metabolic panel     Status: Abnormal   Collection Time: 07/14/20  4:23 AM  Result Value Ref Range   Sodium 132 (L) 135 - 145 mmol/L   Potassium 4.7 3.5 - 5.1 mmol/L   Chloride 99 98 - 111 mmol/L   CO2 25 22 - 32 mmol/L   Glucose, Bld 118 (H) 70 - 99 mg/dL    Comment: Glucose reference range applies only to samples taken after fasting for at least 8 hours.   BUN 21 (H) 6 - 20 mg/dL   Creatinine, Ser 0.85 0.61 - 1.24 mg/dL   Calcium 8.8 (L) 8.9 - 10.3 mg/dL   Total Protein 5.8 (L) 6.5 - 8.1 g/dL   Albumin 2.9 (L) 3.5 - 5.0 g/dL   AST 123 (H) 15 - 41 U/L   ALT 537 (H) 0 - 44 U/L   Alkaline Phosphatase 77 38 - 126 U/L   Total Bilirubin 0.9 0.3 - 1.2 mg/dL   GFR, Estimated >60 >60 mL/min    Comment: (NOTE) Calculated using the CKD-EPI Creatinine Equation (2021)    Anion gap 8 5 - 15    Comment: Performed at [facility], [address] [place], [place] [address]  CBC with Differential/Platelet     Status: Abnormal   Collection Time: 07/14/20  4:23 AM  Result Value Ref Range    WBC 17.0 (H) 4.0 - 10.5 K/uL   RBC 4.83 4.22 - 5.81 MIL/uL   Hemoglobin 13.7 13.0 - 17.0 g/dL   HCT 42.0 39 - 52 %   MCV 87.0 80.0 - 100.0 fL   MCH 28.4 26.0 - 34.0 pg   MCHC 32.6 30.0 - 36.0 g/dL   RDW 15.7 (H) 11.5 - 15.5 %   Platelets 201 150 - 400 K/uL   nRBC 0.4 (H) 0.0 - 0.2 %   Neutrophils Relative % 76 %   Neutro Abs 12.8 (H) 1.7 - 7.7 K/uL   Lymphocytes Relative 9 %   Lymphs Abs 1.6 0.7 - 4.0 K/uL   Monocytes Relative 5 %   Monocytes Absolute 0.9 0.1 - 1.0 K/uL   Eosinophils Relative 0 %   Eosinophils Absolute 0.0 0.0 - 0.5 K/uL   Basophils Relative 1 %   Basophils Absolute 0.1 0.0 - 0.1 K/uL   WBC Morphology MILD LEFT SHIFT (1-5% METAS, OCC MYELO, OCC BANDS)    RBC Morphology MORPHOLOGY UNREMARKABLE    Smear Review Normal platelet morphology    nRBC 0 0 /100 WBC   Immature Granulocytes 9 %   Abs Immature Granulocytes 1.57 (H) 0.00 - 0.07 K/uL    Comment: Performed at [facility], [address] [place], [place] [address]  Magnesium     Status: None   Collection Time: 07/14/20  4:23 AM  Result Value Ref Range   Magnesium 2.4 1.7 - 2.4 mg/dL    Comment: Performed at [facility], [address] [place], [place] [address]  Phosphorus     Status: None   Collection Time: 07/14/20  4:23 AM  Result Value Ref Range   Phosphorus 3.6 2.5 - 4.6 mg/dL    Comment: Performed at [facility], [address] [place], [place] [address]   I have personally reviewed the radiology report from 06/28/20 CTA  Narrative & Impression  CLINICAL DATA:  COVID-19 positive, high probability of pulmonary embolus.  EXAM: CT ANGIOGRAPHY CHEST WITH CONTRAST  TECHNIQUE:  Multidetector CT imaging of the chest was performed using the standard protocol during bolus administration of intravenous contrast. Multiplanar CT image reconstructions and MIPs were obtained to evaluate the vascular anatomy.  CONTRAST:  37m OMNIPAQUE IOHEXOL 350 MG/ML  SOLN  COMPARISON:  None.  FINDINGS: Cardiovascular: Satisfactory opacification of the pulmonary arteries to the segmental level. No evidence of pulmonary embolism. Normal heart size. No pericardial effusion.  Mediastinum/Nodes: No enlarged mediastinal, hilar, or axillary lymph nodes. Thyroid gland, trachea, and esophagus demonstrate no significant findings.  Lungs/Pleura: No pneumothorax or pleural effusion is noted. Multiple patchy airspace opacities are noted throughout both lungs most consistent with multifocal pneumonia due to COVID-19.  Upper Abdomen: Hepatic steatosis is noted.  Musculoskeletal: No chest wall abnormality. No acute or significant osseous findings.  Review of the MIP images confirms the above findings.  IMPRESSION: 1. No definite evidence of pulmonary embolus. 2. Multiple patchy airspace opacities are noted throughout both lungs most consistent with multifocal pneumonia due to COVID-19. 3. Hepatic steatosis.   Electronically Signed   By: JMarijo ConceptionM.D.   On: 06/28/2020 12:25     -------------------------------------------------------------------------- A&P:  Problem List Items Addressed This Visit    Pneumonia due to COVID-19 virus - Primary    Other Visit Diagnoses    Acute hypoxemic respiratory failure due to COVID-19 ([facility]         Significant improvement now after completed hospitalization for COVID19 and pneumonia secondary complication Completed anti viral therapy and antibiotics for PNA complication, steroid course, DC'd on burst of steroids now finished.  He has home oxygen supplemental supply not using regularly, says monitor O2 91-93% most often He has [facility] scheduled soon, they will work with him, he has some generalized and hand weakness  Improving nutrition  Still has sweating but afebrile.  Reassurance given improvement  F/u 1-2 weeks to do 6 min walk test and determine if still indicated for  oxygen we can write DC order if need  No orders of the defined types were placed in this encounter.   Follow-up: - Return in 1-2 weeks for 6 min walk test for oxygen status - He is CLEARED to schedule face to face visit now post covid  Patient verbalizes understanding with the above medical recommendations including the limitation of remote medical advice.  Specific follow-up and call-back criteria were given for patient to follow-up or seek medical care more urgently if needed.   - Time spent in direct consultation with patient on phone: 15 minutes   ANobie Putnam [facility] 07/22/2020, 11:57 AM

## 2020-07-23 ENCOUNTER — Telehealth: Payer: Self-pay

## 2020-07-23 NOTE — Telephone Encounter (Signed)
Copied from [place] [phone]. Topic: General - Inquiry >> Jul 23, 2020  3:05 PM Greggory Keen D wrote: Reason for CRM: pt called saying he is still losing weight since he was in the hospital with Covid.  He wants to know how to start putting weight back on.  CB#  [phone]

## 2020-07-23 NOTE — Telephone Encounter (Signed)
Regular nutrition, focus on high protein / calorie diet.  He can use meal supplement, Boost or Ensure between meals as well 1-2 times a day  [name], [facility] 07/23/2020, 6:32 PM

## 2020-07-24 ENCOUNTER — Telehealth: Payer: Self-pay

## 2020-07-24 NOTE — Telephone Encounter (Signed)
Patient informed. 

## 2020-07-24 NOTE — Telephone Encounter (Signed)
Copied from [place] [id number]. Topic: General - Other >> Jul 24, 2020  1:37 PM Keene Breath wrote: Reason for CRM: Patient would like a call back from the nurse regarding some papers that he needs to get signed from his insurance company.  Please call patient to discuss at [phone] >> Jul 24, 2020  1:55 PM Celene Kras wrote: Pt calling stating that this message can be disregarded. Please advise.

## 2020-07-26 NOTE — Telephone Encounter (Signed)
Received Short Term Disability forms from [facility] on 07/25/20  It looks like patient called about them on 11/17.  He would require a follow-up appointment with me to complete his paperwork for short term disability.  Also he was asked on 11/15 to schedule with me face to face in office in 1-2 weeks for 6 min walk test for determining if he still needs oxygen or if we can discontinue.  We can try to do both oxygen evaluation and paperwork  [name], [facility] 07/26/2020, 6:28 PM

## 2020-07-29 NOTE — Telephone Encounter (Signed)
Left detail message. 

## 2020-08-06 ENCOUNTER — Ambulatory Visit: Payer: PRIVATE HEALTH INSURANCE | Admitting: Family Medicine

## 2020-08-06 ENCOUNTER — Encounter: Payer: Self-pay | Admitting: Family Medicine

## 2020-08-06 ENCOUNTER — Ambulatory Visit
Admission: RE | Admit: 2020-08-06 | Discharge: 2020-08-06 | Disposition: A | Discharge status: Home / Self Care | Payer: 59 | Attending: Family Medicine | Admitting: Family Medicine

## 2020-08-06 ENCOUNTER — Ambulatory Visit
Admission: RE | Admit: 2020-08-06 | Discharge: 2020-08-06 | Disposition: A | Discharge status: Home / Self Care | Payer: 59 | Source: Ambulatory Visit | Attending: Family Medicine | Admitting: Family Medicine

## 2020-08-06 ENCOUNTER — Other Ambulatory Visit: Payer: Self-pay

## 2020-08-06 VITALS — BP 139/82 | HR 106 | Temp 97.8°F | Resp 16 | Ht 66.0 in | Wt 229.6 lb

## 2020-08-06 DIAGNOSIS — J1282 Pneumonia due to coronavirus disease 2019: Secondary | ICD-10-CM

## 2020-08-06 DIAGNOSIS — U071 COVID-19: Secondary | ICD-10-CM | POA: Diagnosis not present

## 2020-08-06 DIAGNOSIS — J9601 Acute respiratory failure with hypoxia: Secondary | ICD-10-CM | POA: Diagnosis not present

## 2020-08-06 NOTE — Patient Instructions (Addendum)
Thank you for coming to the office today.  X-ray today  6 min walk test to get rid of oxygen  Restriction forms sent today  Please schedule a Follow-up Appointment to: Return in 13 days (on 08/19/2020), or if symptoms worsen or fail to improve, for 10-13 days approximately should be BEFORE 08/20/20 for return to work..  If you have any other questions or concerns, please feel free to call the office or send a message through [facility]. You may also schedule an earlier appointment if necessary.  Additionally, you may be receiving a survey about your experience at our office within a few days to 1 week by e-mail or mail. We value your feedback.  [name], DO [place]

## 2020-08-06 NOTE — Progress Notes (Signed)
Subjective:    Patient ID: Derek Cook, male    DOB: 03/28/1961, 59 y.o.   MRN: [id number]  Derek Cook is a 59 y.o. male presenting on 08/06/2020 for hypoxia   HPI   Follow-up Post COVID Pneumonia Date of Admission: 06/25/20 Date of Discharge: 07/14/20 Treated with Remdesivir  Improved overall but still has episodic symptoms with dyspnea  He describes complications or issues with Left hand 4th and 5th fingers being numb, he says started in hospital after treatment through IV in hospital.  [facility] PT, helping him improve with elliptical machine, he is improving his breathing function and not having issue with oxygen, seems to maintain >90-92% even with exertion. He is doing more walking and function improved, still has some days with reduced function  He is trying to determine what day he can return to work. He thinks he needs more time with home rehab exercises. 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 Asks about oxygen status today.   Depression screen [facility] 2/9 08/06/2020 07/22/2020 08/10/2018  Decreased Interest 0 0 0  Down, Depressed, Hopeless 0 0 0  PHQ - 2 Score 0 0 0    Social History   Tobacco Use  . Smoking status: Former Smoker    Quit date: 09/07/2009    Years since quitting: 10.9  . Smokeless tobacco: Former Network engineer  . Vaping Use: Never used  Substance Use Topics  . Alcohol use: No  . Drug use: No    Review of Systems Per HPI unless specifically indicated above     Objective:    BP 139/82   Pulse (!) 106   Temp 97.8 F (36.6 C) (Temporal)   Resp 16   Ht 5\' 6"  (1.676 m)   Wt 229 lb 9.6 oz (104.1 kg)   SpO2 97%   BMI 37.06 kg/m   Wt Readings from Last 3 Encounters:  08/06/20 229 lb 9.6 oz (104.1 kg)  07/14/20 231 lb 11.3 oz (105.1 kg)  06/21/20 250 lb (113.4 kg)    Pulse Oximetry Testing  At rest (Room Air) - 97%  Walking  (Room Air) - desat after 2 minutes to 87%  Failed 6 min walk test today.  Physical Exam Vitals and nursing note reviewed.  Constitutional:      General: He is not in acute distress.    Appearance: He is well-developed. He is not diaphoretic.     Comments: Well-appearing, comfortable, cooperative  HENT:     Head: Normocephalic and atraumatic.  Eyes:     General:        Right eye: No discharge.        Left eye: No discharge.     Conjunctiva/sclera: Conjunctivae normal.  Neck:     Thyroid: No thyromegaly.  Cardiovascular:     Rate and Rhythm: Normal rate and regular rhythm.     Heart sounds: Normal heart sounds. No murmur heard.   Pulmonary:     Effort: Pulmonary effort is normal. No respiratory distress.     Breath sounds: No wheezing or rales.     Comments: Reduced air movement lower bases of lung bilateral some coarse sounds.  Speaks full sentences. Musculoskeletal:        General: Normal range of motion.     Cervical back: Normal range of motion and neck supple.  Lymphadenopathy:     Cervical: No cervical adenopathy.  Skin:    General: Skin is warm and dry.     Findings: No erythema or rash.  Neurological:     Mental Status: He is alert and oriented to person, place, and time.  Psychiatric:        Behavior: Behavior normal.     Comments: Well groomed, good eye contact, normal speech and thoughts     NEW X-ray today  I have personally reviewed the radiology report from Chest X-ray on 08/06/20.  CLINICAL DATA:  59 year old male with post COVID pneumonia complication.  EXAM: CHEST - 2 VIEW  COMPARISON:  Chest radiograph dated 07/09/2020  FINDINGS: Bilateral streaky and hazy densities primarily involving the mid to lower lung field in keeping with COVID pneumonia. Overall improved pulmonary densities compared to prior radiograph. No pleural effusion pneumothorax. The cardiac silhouette is within limits. No acute osseous pathology.  IMPRESSION: Improved  bilateral pulmonary densities.   Electronically Signed   By: Anner Crete M.D.   On: 08/06/2020 15:20   ------ Old x-ray results  I have personally reviewed the radiology report from 07/09/20 CXR.  CLINICAL DATA:  Pneumonia due to COVID  EXAM: PORTABLE CHEST 1 VIEW  COMPARISON:  07/05/2020  FINDINGS: Patchy bilateral airspace opacities are again noted, unchanged. Heart is normal size. No effusions or pneumothorax. No acute bony abnormality.  IMPRESSION: Patchy bilateral opacities compatible with COVID pneumonia. No change.   Electronically Signed   By: Rolm Baptise M.D.   On: 07/09/2020 02:55   I have personally reviewed the radiology report from 06/28/20 CT Chest.  CLINICAL DATA:  COVID-19 positive, high probability of pulmonary embolus.  EXAM: CT ANGIOGRAPHY CHEST WITH CONTRAST  TECHNIQUE: Multidetector CT imaging of the chest was performed using the standard protocol during bolus administration of intravenous contrast. Multiplanar CT image reconstructions and MIPs were obtained to evaluate the vascular anatomy.  CONTRAST:  16mL OMNIPAQUE IOHEXOL 350 MG/ML SOLN  COMPARISON:  None.  FINDINGS: Cardiovascular: Satisfactory opacification of the pulmonary arteries to the segmental level. No evidence of pulmonary embolism. Normal heart size. No pericardial effusion.  Mediastinum/Nodes: No enlarged mediastinal, hilar, or axillary lymph nodes. Thyroid gland, trachea, and esophagus demonstrate no significant findings.  Lungs/Pleura: No pneumothorax or pleural effusion is noted. Multiple patchy airspace opacities are noted throughout both lungs most consistent with multifocal pneumonia due to COVID-19.  Upper Abdomen: Hepatic steatosis is noted.  Musculoskeletal: No chest wall abnormality. No acute or significant osseous findings.  Review of the MIP images confirms the above findings.  IMPRESSION: 1. No definite evidence of  pulmonary embolus. 2. Multiple patchy airspace opacities are noted throughout both lungs most consistent with multifocal pneumonia due to COVID-19. 3. Hepatic steatosis.   Electronically Signed   By: Marijo Conception M.D.   On: 06/28/2020 12:25  Results for orders placed or performed during the hospital encounter of 06/25/20  CBC with Differential  Result Value Ref Range   WBC 7.5 4.0 - 10.5 K/uL   RBC 5.29 4.22 - 5.81 MIL/uL   Hemoglobin 14.7 13.0 - 17.0 g/dL   HCT 43.2 39 - 52 %   MCV 81.7 80.0 - 100.0 fL   MCH 27.8 26.0 - 34.0 pg   MCHC 34.0 30.0 - 36.0 g/dL   RDW 14.6 11.5 - 15.5 %   Platelets 242 150 - 400 K/uL   nRBC 0.0 0.0 - 0.2 %   Neutrophils Relative % 85 %  Neutro Abs 6.4 1.7 - 7.7 K/uL   Lymphocytes Relative 9 %   Lymphs Abs 0.7 0.7 - 4.0 K/uL   Monocytes Relative 6 %   Monocytes Absolute 0.4 0.1 - 1.0 K/uL   Eosinophils Relative 0 %   Eosinophils Absolute 0.0 0.0 - 0.5 K/uL   Basophils Relative 0 %   Basophils Absolute 0.0 0.0 - 0.1 K/uL   Immature Granulocytes 0 %   Abs Immature Granulocytes 0.02 0.00 - 0.07 K/uL  Comprehensive metabolic panel  Result Value Ref Range   Sodium 134 (L) 135 - 145 mmol/L   Potassium 4.2 3.5 - 5.1 mmol/L   Chloride 100 98 - 111 mmol/L   CO2 22 22 - 32 mmol/L   Glucose, Bld 120 (H) 70 - 99 mg/dL   BUN 20 6 - 20 mg/dL   Creatinine, Ser 0.93 0.61 - 1.24 mg/dL   Calcium 8.6 (L) 8.9 - 10.3 mg/dL   Total Protein 7.5 6.5 - 8.1 g/dL   Albumin 3.4 (L) 3.5 - 5.0 g/dL   AST 129 (H) 15 - 41 U/L   ALT 75 (H) 0 - 44 U/L   Alkaline Phosphatase 57 38 - 126 U/L   Total Bilirubin 1.1 0.3 - 1.2 mg/dL   GFR, Estimated >60 >60 mL/min   Anion gap 12 5 - 15  Lipase, blood  Result Value Ref Range   Lipase 29 11 - 51 U/L  Lactic acid, plasma  Result Value Ref Range   Lactic Acid, Venous 1.8 0.5 - 1.9 mmol/L  Urinalysis, Complete w Microscopic  Result Value Ref Range   Color, Urine YELLOW (A) YELLOW   APPearance CLEAR (A) CLEAR    Specific Gravity, Urine 1.019 1.005 - 1.030   pH 6.0 5.0 - 8.0   Glucose, UA NEGATIVE NEGATIVE mg/dL   Hgb urine dipstick NEGATIVE NEGATIVE   Bilirubin Urine NEGATIVE NEGATIVE   Ketones, ur 20 (A) NEGATIVE mg/dL   Protein, ur 30 (A) NEGATIVE mg/dL   Nitrite NEGATIVE NEGATIVE   Leukocytes,Ua NEGATIVE NEGATIVE   RBC / HPF 0-5 0 - 5 RBC/hpf   WBC, UA 0-5 0 - 5 WBC/hpf   Bacteria, UA NONE SEEN NONE SEEN   Squamous Epithelial / LPF NONE SEEN 0 - 5   Mucus PRESENT    Hyaline Casts, UA PRESENT   HIV Antibody (routine testing w rflx)  Result Value Ref Range   HIV Screen 4th Generation wRfx Non Reactive Non Reactive  Comprehensive metabolic panel  Result Value Ref Range   Sodium 138 135 - 145 mmol/L   Potassium 4.1 3.5 - 5.1 mmol/L   Chloride 103 98 - 111 mmol/L   CO2 22 22 - 32 mmol/L   Glucose, Bld 147 (H) 70 - 99 mg/dL   BUN 21 (H) 6 - 20 mg/dL   Creatinine, Ser 0.98 0.61 - 1.24 mg/dL   Calcium 8.7 (L) 8.9 - 10.3 mg/dL   Total Protein 6.7 6.5 - 8.1 g/dL   Albumin 3.2 (L) 3.5 - 5.0 g/dL   AST 105 (H) 15 - 41 U/L   ALT 70 (H) 0 - 44 U/L   Alkaline Phosphatase 60 38 - 126 U/L   Total Bilirubin 0.9 0.3 - 1.2 mg/dL   GFR, Estimated >60 >60 mL/min   Anion gap 13 5 - 15  CBC WITH DIFFERENTIAL  Result Value Ref Range   WBC 3.8 (L) 4.0 - 10.5 K/uL   RBC 5.06 4.22 - 5.81 MIL/uL   Hemoglobin  14.2 13.0 - 17.0 g/dL   HCT 42.6 39 - 52 %   MCV 84.2 80.0 - 100.0 fL   MCH 28.1 26.0 - 34.0 pg   MCHC 33.3 30.0 - 36.0 g/dL   RDW 14.7 11.5 - 15.5 %   Platelets 222 150 - 400 K/uL   nRBC 0.0 0.0 - 0.2 %   Neutrophils Relative % 77 %   Neutro Abs 3.0 1.7 - 7.7 K/uL   Lymphocytes Relative 15 %   Lymphs Abs 0.6 (L) 0.7 - 4.0 K/uL   Monocytes Relative 7 %   Monocytes Absolute 0.3 0.1 - 1.0 K/uL   Eosinophils Relative 0 %   Eosinophils Absolute 0.0 0.0 - 0.5 K/uL   Basophils Relative 0 %   Basophils Absolute 0.0 0.0 - 0.1 K/uL   Immature Granulocytes 1 %   Abs Immature Granulocytes 0.02 0.00  - 0.07 K/uL  Magnesium  Result Value Ref Range   Magnesium 2.4 1.7 - 2.4 mg/dL  Phosphorus  Result Value Ref Range   Phosphorus 4.2 2.5 - 4.6 mg/dL  Blood gas, arterial  Result Value Ref Range   FIO2 0.40    Delivery systems NASAL CANNULA    pH, Arterial 7.42 7.35 - 7.45   pCO2 arterial 33 32 - 48 mmHg   pO2, Arterial 63 (L) 83 - 108 mmHg   Bicarbonate 21.4 20.0 - 28.0 mmol/L   Acid-base deficit 2.2 (H) 0.0 - 2.0 mmol/L   O2 Saturation 92.2 %   Patient temperature 37.0    Collection site RIGHT RADIAL    Sample type ARTERIAL DRAW    Allens test (pass/fail) PASS PASS  C-reactive protein  Result Value Ref Range   CRP 17.3 (H) <1.0 mg/dL  Ferritin  Result Value Ref Range   Ferritin 1,626 (H) 24 - 336 ng/mL  Fibrinogen  Result Value Ref Range   Fibrinogen 726 (H) 210 - 475 mg/dL  Lactate dehydrogenase  Result Value Ref Range   LDH 459 (H) 98 - 192 U/L  Magnesium  Result Value Ref Range   Magnesium 2.5 (H) 1.7 - 2.4 mg/dL  Procalcitonin - Baseline  Result Value Ref Range   Procalcitonin 0.63 ng/mL  C-reactive protein  Result Value Ref Range   CRP 8.4 (H) <1.0 mg/dL  Procalcitonin  Result Value Ref Range   Procalcitonin 0.51 ng/mL  CBC  Result Value Ref Range   WBC 8.4 4.0 - 10.5 K/uL   RBC 4.87 4.22 - 5.81 MIL/uL   Hemoglobin 13.9 13.0 - 17.0 g/dL   HCT 41.3 39 - 52 %   MCV 84.8 80.0 - 100.0 fL   MCH 28.5 26.0 - 34.0 pg   MCHC 33.7 30.0 - 36.0 g/dL   RDW 14.5 11.5 - 15.5 %   Platelets 299 150 - 400 K/uL   nRBC 0.0 0.0 - 0.2 %  Comprehensive metabolic panel  Result Value Ref Range   Sodium 138 135 - 145 mmol/L   Potassium 4.5 3.5 - 5.1 mmol/L   Chloride 105 98 - 111 mmol/L   CO2 20 (L) 22 - 32 mmol/L   Glucose, Bld 134 (H) 70 - 99 mg/dL   BUN 21 (H) 6 - 20 mg/dL   Creatinine, Ser 0.74 0.61 - 1.24 mg/dL   Calcium 8.3 (L) 8.9 - 10.3 mg/dL   Total Protein 6.5 6.5 - 8.1 g/dL   Albumin 3.0 (L) 3.5 - 5.0 g/dL   AST 75 (H) 15 - 41 U/L  ALT 72 (H) 0 - 44 U/L    Alkaline Phosphatase 63 38 - 126 U/L   Total Bilirubin 0.9 0.3 - 1.2 mg/dL   GFR, Estimated >60 >60 mL/min   Anion gap 13 5 - 15  Magnesium  Result Value Ref Range   Magnesium 2.6 (H) 1.7 - 2.4 mg/dL  Fibrin derivatives D-Dimer ([facility] only)  Result Value Ref Range   Fibrin derivatives D-dimer ([facility]) 3,157.14 (H) 0.00 - 499.00 ng/mL (FEU)  Glucose, capillary  Result Value Ref Range   Glucose-Capillary 128 (H) 70 - 99 mg/dL  C-reactive protein  Result Value Ref Range   CRP 3.2 (H) <1.0 mg/dL  Procalcitonin  Result Value Ref Range   Procalcitonin 0.25 ng/mL  Comprehensive metabolic panel  Result Value Ref Range   Sodium 136 135 - 145 mmol/L   Potassium 4.4 3.5 - 5.1 mmol/L   Chloride 104 98 - 111 mmol/L   CO2 22 22 - 32 mmol/L   Glucose, Bld 135 (H) 70 - 99 mg/dL   BUN 25 (H) 6 - 20 mg/dL   Creatinine, Ser 0.80 0.61 - 1.24 mg/dL   Calcium 8.5 (L) 8.9 - 10.3 mg/dL   Total Protein 6.4 (L) 6.5 - 8.1 g/dL   Albumin 3.0 (L) 3.5 - 5.0 g/dL   AST 46 (H) 15 - 41 U/L   ALT 69 (H) 0 - 44 U/L   Alkaline Phosphatase 64 38 - 126 U/L   Total Bilirubin 1.0 0.3 - 1.2 mg/dL   GFR, Estimated >60 >60 mL/min   Anion gap 10 5 - 15  Fibrin derivatives D-Dimer ([facility] only)  Result Value Ref Range   Fibrin derivatives D-dimer ([facility]) >7,500.00 (H) 0.00 - 499.00 ng/mL (FEU)  C-reactive protein  Result Value Ref Range   CRP 1.3 (H) <1.0 mg/dL  Comprehensive metabolic panel  Result Value Ref Range   Sodium 134 (L) 135 - 145 mmol/L   Potassium 4.6 3.5 - 5.1 mmol/L   Chloride 103 98 - 111 mmol/L   CO2 23 22 - 32 mmol/L   Glucose, Bld 160 (H) 70 - 99 mg/dL   BUN 26 (H) 6 - 20 mg/dL   Creatinine, Ser 0.94 0.61 - 1.24 mg/dL   Calcium 8.3 (L) 8.9 - 10.3 mg/dL   Total Protein 6.1 (L) 6.5 - 8.1 g/dL   Albumin 3.0 (L) 3.5 - 5.0 g/dL   AST 42 (H) 15 - 41 U/L   ALT 69 (H) 0 - 44 U/L   Alkaline Phosphatase 57 38 - 126 U/L   Total Bilirubin 0.9 0.3 - 1.2 mg/dL   GFR, Estimated >60 >60 mL/min   Anion  gap 8 5 - 15  Fibrin derivatives D-Dimer ([facility] only)  Result Value Ref Range   Fibrin derivatives D-dimer ([facility]) 5,539.67 (H) 0.00 - 499.00 ng/mL (FEU)  C-reactive protein  Result Value Ref Range   CRP 0.6 <1.0 mg/dL  Comprehensive metabolic panel  Result Value Ref Range   Sodium 134 (L) 135 - 145 mmol/L   Potassium 4.7 3.5 - 5.1 mmol/L   Chloride 103 98 - 111 mmol/L   CO2 24 22 - 32 mmol/L   Glucose, Bld 148 (H) 70 - 99 mg/dL   BUN 25 (H) 6 - 20 mg/dL   Creatinine, Ser 0.88 0.61 - 1.24 mg/dL   Calcium 8.4 (L) 8.9 - 10.3 mg/dL   Total Protein 6.1 (L) 6.5 - 8.1 g/dL   Albumin 3.1 (L) 3.5 - 5.0 g/dL  AST 43 (H) 15 - 41 U/L   ALT 81 (H) 0 - 44 U/L   Alkaline Phosphatase 55 38 - 126 U/L   Total Bilirubin 1.0 0.3 - 1.2 mg/dL   GFR, Estimated >60 >60 mL/min   Anion gap 7 5 - 15  Fibrin derivatives D-Dimer ([facility] only)  Result Value Ref Range   Fibrin derivatives D-dimer ([facility]) 3,263.67 (H) 0.00 - 499.00 ng/mL (FEU)  C-reactive protein  Result Value Ref Range   CRP 0.5 <1.0 mg/dL  Comprehensive metabolic panel  Result Value Ref Range   Sodium 132 (L) 135 - 145 mmol/L   Potassium 4.4 3.5 - 5.1 mmol/L   Chloride 100 98 - 111 mmol/L   CO2 23 22 - 32 mmol/L   Glucose, Bld 126 (H) 70 - 99 mg/dL   BUN 26 (H) 6 - 20 mg/dL   Creatinine, Ser 1.07 0.61 - 1.24 mg/dL   Calcium 8.8 (L) 8.9 - 10.3 mg/dL   Total Protein 6.4 (L) 6.5 - 8.1 g/dL   Albumin 3.2 (L) 3.5 - 5.0 g/dL   AST 37 15 - 41 U/L   ALT 85 (H) 0 - 44 U/L   Alkaline Phosphatase 55 38 - 126 U/L   Total Bilirubin 1.0 0.3 - 1.2 mg/dL   GFR, Estimated >60 >60 mL/min   Anion gap 9 5 - 15  Fibrin derivatives D-Dimer ([facility] only)  Result Value Ref Range   Fibrin derivatives D-dimer ([facility]) 2,335.87 (H) 0.00 - 499.00 ng/mL (FEU)  C-reactive protein  Result Value Ref Range   CRP 1.7 (H) <1.0 mg/dL  Comprehensive metabolic panel  Result Value Ref Range   Sodium 133 (L) 135 - 145 mmol/L   Potassium 4.5 3.5 - 5.1 mmol/L    Chloride 103 98 - 111 mmol/L   CO2 21 (L) 22 - 32 mmol/L   Glucose, Bld 143 (H) 70 - 99 mg/dL   BUN 24 (H) 6 - 20 mg/dL   Creatinine, Ser 0.90 0.61 - 1.24 mg/dL   Calcium 8.5 (L) 8.9 - 10.3 mg/dL   Total Protein 6.2 (L) 6.5 - 8.1 g/dL   Albumin 3.1 (L) 3.5 - 5.0 g/dL   AST 41 15 - 41 U/L   ALT 92 (H) 0 - 44 U/L   Alkaline Phosphatase 62 38 - 126 U/L   Total Bilirubin 1.0 0.3 - 1.2 mg/dL   GFR, Estimated >60 >60 mL/min   Anion gap 9 5 - 15  Fibrin derivatives D-Dimer ([facility] only)  Result Value Ref Range   Fibrin derivatives D-dimer ([facility]) 1,997.30 (H) 0.00 - 499.00 ng/mL (FEU)  CBC  Result Value Ref Range   WBC 14.3 (H) 4.0 - 10.5 K/uL   RBC 5.29 4.22 - 5.81 MIL/uL   Hemoglobin 14.9 13.0 - 17.0 g/dL   HCT 44.3 39 - 52 %   MCV 83.7 80.0 - 100.0 fL   MCH 28.2 26.0 - 34.0 pg   MCHC 33.6 30.0 - 36.0 g/dL   RDW 14.2 11.5 - 15.5 %   Platelets 590 (H) 150 - 400 K/uL   nRBC 0.0 0.0 - 0.2 %  C-reactive protein  Result Value Ref Range   CRP 0.5 <1.0 mg/dL  Comprehensive metabolic panel  Result Value Ref Range   Sodium 132 (L) 135 - 145 mmol/L   Potassium 4.4 3.5 - 5.1 mmol/L   Chloride 101 98 - 111 mmol/L   CO2 23 22 - 32 mmol/L   Glucose, Bld 114 (H) 70 -  99 mg/dL   BUN 22 (H) 6 - 20 mg/dL   Creatinine, Ser 0.74 0.61 - 1.24 mg/dL   Calcium 8.5 (L) 8.9 - 10.3 mg/dL   Total Protein 6.1 (L) 6.5 - 8.1 g/dL   Albumin 3.1 (L) 3.5 - 5.0 g/dL   AST 34 15 - 41 U/L   ALT 90 (H) 0 - 44 U/L   Alkaline Phosphatase 50 38 - 126 U/L   Total Bilirubin 0.8 0.3 - 1.2 mg/dL   GFR, Estimated >60 >60 mL/min   Anion gap 8 5 - 15  Fibrin derivatives D-Dimer ([facility] only)  Result Value Ref Range   Fibrin derivatives D-dimer ([facility]) 1,462.84 (H) 0.00 - 499.00 ng/mL (FEU)  Comprehensive metabolic panel  Result Value Ref Range   Sodium 131 (L) 135 - 145 mmol/L   Potassium 4.6 3.5 - 5.1 mmol/L   Chloride 98 98 - 111 mmol/L   CO2 23 22 - 32 mmol/L   Glucose, Bld 134 (H) 70 - 99 mg/dL   BUN 27 (H) 6 -  20 mg/dL   Creatinine, Ser 0.95 0.61 - 1.24 mg/dL   Calcium 8.5 (L) 8.9 - 10.3 mg/dL   Total Protein 6.3 (L) 6.5 - 8.1 g/dL   Albumin 3.2 (L) 3.5 - 5.0 g/dL   AST 43 (H) 15 - 41 U/L   ALT 98 (H) 0 - 44 U/L   Alkaline Phosphatase 58 38 - 126 U/L   Total Bilirubin 1.1 0.3 - 1.2 mg/dL   GFR, Estimated >60 >60 mL/min   Anion gap 10 5 - 15  Fibrin derivatives D-Dimer ([facility] only)  Result Value Ref Range   Fibrin derivatives D-dimer ([facility]) 1,263.46 (H) 0.00 - 499.00 ng/mL (FEU)  Comprehensive metabolic panel  Result Value Ref Range   Sodium 131 (L) 135 - 145 mmol/L   Potassium 4.9 3.5 - 5.1 mmol/L   Chloride 98 98 - 111 mmol/L   CO2 24 22 - 32 mmol/L   Glucose, Bld 143 (H) 70 - 99 mg/dL   BUN 26 (H) 6 - 20 mg/dL   Creatinine, Ser 0.98 0.61 - 1.24 mg/dL   Calcium 8.8 (L) 8.9 - 10.3 mg/dL   Total Protein 6.4 (L) 6.5 - 8.1 g/dL   Albumin 3.3 (L) 3.5 - 5.0 g/dL   AST 42 (H) 15 - 41 U/L   ALT 106 (H) 0 - 44 U/L   Alkaline Phosphatase 61 38 - 126 U/L   Total Bilirubin 1.3 (H) 0.3 - 1.2 mg/dL   GFR, Estimated >60 >60 mL/min   Anion gap 9 5 - 15  Fibrin derivatives D-Dimer ([facility] only)  Result Value Ref Range   Fibrin derivatives D-dimer ([facility]) 1,085.87 (H) 0.00 - 499.00 ng/mL (FEU)  CBC  Result Value Ref Range   WBC 18.4 (H) 4.0 - 10.5 K/uL   RBC 5.31 4.22 - 5.81 MIL/uL   Hemoglobin 15.2 13.0 - 17.0 g/dL   HCT 44.5 39 - 52 %   MCV 83.8 80.0 - 100.0 fL   MCH 28.6 26.0 - 34.0 pg   MCHC 34.2 30.0 - 36.0 g/dL   RDW 14.4 11.5 - 15.5 %   Platelets 557 (H) 150 - 400 K/uL   nRBC 0.1 0.0 - 0.2 %  Comprehensive metabolic panel  Result Value Ref Range   Sodium 130 (L) 135 - 145 mmol/L   Potassium 4.8 3.5 - 5.1 mmol/L   Chloride 94 (L) 98 - 111 mmol/L   CO2 26 22 -  32 mmol/L   Glucose, Bld 137 (H) 70 - 99 mg/dL   BUN 29 (H) 6 - 20 mg/dL   Creatinine, Ser 0.93 0.61 - 1.24 mg/dL   Calcium 8.9 8.9 - 10.3 mg/dL   Total Protein 6.1 (L) 6.5 - 8.1 g/dL   Albumin 3.2 (L) 3.5 - 5.0 g/dL   AST  54 (H) 15 - 41 U/L   ALT 143 (H) 0 - 44 U/L   Alkaline Phosphatase 61 38 - 126 U/L   Total Bilirubin 1.0 0.3 - 1.2 mg/dL   GFR, Estimated >60 >60 mL/min   Anion gap 10 5 - 15  Magnesium  Result Value Ref Range   Magnesium 2.6 (H) 1.7 - 2.4 mg/dL  Comprehensive metabolic panel  Result Value Ref Range   Sodium 133 (L) 135 - 145 mmol/L   Potassium 4.4 3.5 - 5.1 mmol/L   Chloride 96 (L) 98 - 111 mmol/L   CO2 25 22 - 32 mmol/L   Glucose, Bld 145 (H) 70 - 99 mg/dL   BUN 33 (H) 6 - 20 mg/dL   Creatinine, Ser 1.09 0.61 - 1.24 mg/dL   Calcium 8.5 (L) 8.9 - 10.3 mg/dL   Total Protein 5.8 (L) 6.5 - 8.1 g/dL   Albumin 3.1 (L) 3.5 - 5.0 g/dL   AST 73 (H) 15 - 41 U/L   ALT 179 (H) 0 - 44 U/L   Alkaline Phosphatase 63 38 - 126 U/L   Total Bilirubin 1.1 0.3 - 1.2 mg/dL   GFR, Estimated >60 >60 mL/min   Anion gap 12 5 - 15  CBC  Result Value Ref Range   WBC 18.4 (H) 4.0 - 10.5 K/uL   RBC 5.18 4.22 - 5.81 MIL/uL   Hemoglobin 14.8 13.0 - 17.0 g/dL   HCT 43.8 39 - 52 %   MCV 84.6 80.0 - 100.0 fL   MCH 28.6 26.0 - 34.0 pg   MCHC 33.8 30.0 - 36.0 g/dL   RDW 14.5 11.5 - 15.5 %   Platelets 481 (H) 150 - 400 K/uL   nRBC 0.0 0.0 - 0.2 %  Comprehensive metabolic panel  Result Value Ref Range   Sodium 132 (L) 135 - 145 mmol/L   Potassium 4.6 3.5 - 5.1 mmol/L   Chloride 93 (L) 98 - 111 mmol/L   CO2 27 22 - 32 mmol/L   Glucose, Bld 132 (H) 70 - 99 mg/dL   BUN 33 (H) 6 - 20 mg/dL   Creatinine, Ser 1.01 0.61 - 1.24 mg/dL   Calcium 8.9 8.9 - 10.3 mg/dL   Total Protein 6.1 (L) 6.5 - 8.1 g/dL   Albumin 3.2 (L) 3.5 - 5.0 g/dL   AST 108 (H) 15 - 41 U/L   ALT 281 (H) 0 - 44 U/L   Alkaline Phosphatase 62 38 - 126 U/L   Total Bilirubin 1.2 0.3 - 1.2 mg/dL   GFR, Estimated >60 >60 mL/min   Anion gap 12 5 - 15  Comprehensive metabolic panel  Result Value Ref Range   Sodium 130 (L) 135 - 145 mmol/L   Potassium 4.6 3.5 - 5.1 mmol/L   Chloride 91 (L) 98 - 111 mmol/L   CO2 27 22 - 32 mmol/L    Glucose, Bld 117 (H) 70 - 99 mg/dL   BUN 31 (H) 6 - 20 mg/dL   Creatinine, Ser 0.94 0.61 - 1.24 mg/dL   Calcium 8.4 (L) 8.9 - 10.3 mg/dL   Total Protein 5.9 (L)  6.5 - 8.1 g/dL   Albumin 3.2 (L) 3.5 - 5.0 g/dL   AST 188 (H) 15 - 41 U/L   ALT 495 (H) 0 - 44 U/L   Alkaline Phosphatase 72 38 - 126 U/L   Total Bilirubin 1.2 0.3 - 1.2 mg/dL   GFR, Estimated >60 >60 mL/min   Anion gap 12 5 - 15  CBC with Differential/Platelet  Result Value Ref Range   WBC 17.7 (H) 4.0 - 10.5 K/uL   RBC 5.03 4.22 - 5.81 MIL/uL   Hemoglobin 14.5 13.0 - 17.0 g/dL   HCT 43.0 39 - 52 %   MCV 85.5 80.0 - 100.0 fL   MCH 28.8 26.0 - 34.0 pg   MCHC 33.7 30.0 - 36.0 g/dL   RDW 14.8 11.5 - 15.5 %   Platelets 351 150 - 400 K/uL   nRBC 0.0 0.0 - 0.2 %   Neutrophils Relative % 79 %   Neutro Abs 14.0 (H) 1.7 - 7.7 K/uL   Lymphocytes Relative 6 %   Lymphs Abs 1.0 0.7 - 4.0 K/uL   Monocytes Relative 10 %   Monocytes Absolute 1.8 (H) 0.1 - 1.0 K/uL   Eosinophils Relative 0 %   Eosinophils Absolute 0.0 0.0 - 0.5 K/uL   Basophils Relative 0 %   Basophils Absolute 0.0 0.0 - 0.1 K/uL   Immature Granulocytes 5 %   Abs Immature Granulocytes 0.83 (H) 0.00 - 0.07 K/uL  Comprehensive metabolic panel  Result Value Ref Range   Sodium 133 (L) 135 - 145 mmol/L   Potassium 4.6 3.5 - 5.1 mmol/L   Chloride 91 (L) 98 - 111 mmol/L   CO2 26 22 - 32 mmol/L   Glucose, Bld 160 (H) 70 - 99 mg/dL   BUN 35 (H) 6 - 20 mg/dL   Creatinine, Ser 1.00 0.61 - 1.24 mg/dL   Calcium 9.1 8.9 - 10.3 mg/dL   Total Protein 5.9 (L) 6.5 - 8.1 g/dL   Albumin 3.1 (L) 3.5 - 5.0 g/dL   AST 153 (H) 15 - 41 U/L   ALT 496 (H) 0 - 44 U/L   Alkaline Phosphatase 77 38 - 126 U/L   Total Bilirubin 1.4 (H) 0.3 - 1.2 mg/dL   GFR, Estimated >60 >60 mL/min   Anion gap 16 (H) 5 - 15  Hepatitis panel, acute  Result Value Ref Range   Hepatitis B Surface Ag NON REACTIVE NON REACTIVE   HCV Ab NON REACTIVE NON REACTIVE   Hep A IgM NON REACTIVE NON REACTIVE    Hep B C IgM NON REACTIVE NON REACTIVE  Procalcitonin - Baseline  Result Value Ref Range   Procalcitonin 0.30 ng/mL  Brain natriuretic peptide  Result Value Ref Range   B Natriuretic Peptide 82.3 0.0 - 100.0 pg/mL  Basic metabolic panel  Result Value Ref Range   Sodium 131 (L) 135 - 145 mmol/L   Potassium 5.1 3.5 - 5.1 mmol/L   Chloride 90 (L) 98 - 111 mmol/L   CO2 27 22 - 32 mmol/L   Glucose, Bld 124 (H) 70 - 99 mg/dL   BUN 30 (H) 6 - 20 mg/dL   Creatinine, Ser 1.00 0.61 - 1.24 mg/dL   Calcium 8.6 (L) 8.9 - 10.3 mg/dL   GFR, Estimated >60 >60 mL/min   Anion gap 14 5 - 15  Magnesium  Result Value Ref Range   Magnesium 2.7 (H) 1.7 - 2.4 mg/dL  CBC with Differential/Platelet  Result Value Ref Range  WBC 18.9 (H) 4.0 - 10.5 K/uL   RBC 5.03 4.22 - 5.81 MIL/uL   Hemoglobin 14.4 13.0 - 17.0 g/dL   HCT 43.1 39 - 52 %   MCV 85.7 80.0 - 100.0 fL   MCH 28.6 26.0 - 34.0 pg   MCHC 33.4 30.0 - 36.0 g/dL   RDW 15.1 11.5 - 15.5 %   Platelets 314 150 - 400 K/uL   nRBC 0.0 0.0 - 0.2 %   Neutrophils Relative % 76 %   Neutro Abs 14.5 (H) 1.7 - 7.7 K/uL   Lymphocytes Relative 8 %   Lymphs Abs 1.5 0.7 - 4.0 K/uL   Monocytes Relative 9 %   Monocytes Absolute 1.6 (H) 0.1 - 1.0 K/uL   Eosinophils Relative 0 %   Eosinophils Absolute 0.0 0.0 - 0.5 K/uL   Basophils Relative 1 %   Basophils Absolute 0.1 0.0 - 0.1 K/uL   WBC Morphology MORPHOLOGY UNREMARKABLE    RBC Morphology MORPHOLOGY UNREMARKABLE    Smear Review Normal platelet morphology    Immature Granulocytes 6 %   Abs Immature Granulocytes 1.15 (H) 0.00 - 0.07 K/uL  Hepatic function panel  Result Value Ref Range   Total Protein 5.8 (L) 6.5 - 8.1 g/dL   Albumin 2.9 (L) 3.5 - 5.0 g/dL   AST 163 (H) 15 - 41 U/L   ALT 547 (H) 0 - 44 U/L   Alkaline Phosphatase 71 38 - 126 U/L   Total Bilirubin 1.4 (H) 0.3 - 1.2 mg/dL   Bilirubin, Direct 0.3 (H) 0.0 - 0.2 mg/dL   Indirect Bilirubin 1.1 (H) 0.3 - 0.9 mg/dL  CBC with  Differential/Platelet  Result Value Ref Range   WBC 17.2 (H) 4.0 - 10.5 K/uL   RBC 4.75 4.22 - 5.81 MIL/uL   Hemoglobin 13.4 13.0 - 17.0 g/dL   HCT 41.4 39 - 52 %   MCV 87.2 80.0 - 100.0 fL   MCH 28.2 26.0 - 34.0 pg   MCHC 32.4 30.0 - 36.0 g/dL   RDW 15.0 11.5 - 15.5 %   Platelets 257 150 - 400 K/uL   nRBC 0.0 0.0 - 0.2 %   Neutrophils Relative % 77 %   Neutro Abs 13.3 (H) 1.7 - 7.7 K/uL   Lymphocytes Relative 7 %   Lymphs Abs 1.2 0.7 - 4.0 K/uL   Monocytes Relative 7 %   Monocytes Absolute 1.2 (H) 0.1 - 1.0 K/uL   Eosinophils Relative 0 %   Eosinophils Absolute 0.0 0.0 - 0.5 K/uL   Basophils Relative 1 %   Basophils Absolute 0.1 0.0 - 0.1 K/uL   WBC Morphology MORPHOLOGY UNREMARKABLE    RBC Morphology MORPHOLOGY UNREMARKABLE    Smear Review Normal platelet morphology    Immature Granulocytes 8 %   Abs Immature Granulocytes 1.40 (H) 0.00 - 0.07 K/uL  Basic metabolic panel  Result Value Ref Range   Sodium 129 (L) 135 - 145 mmol/L   Potassium 4.7 3.5 - 5.1 mmol/L   Chloride 94 (L) 98 - 111 mmol/L   CO2 23 22 - 32 mmol/L   Glucose, Bld 171 (H) 70 - 99 mg/dL   BUN 24 (H) 6 - 20 mg/dL   Creatinine, Ser 0.97 0.61 - 1.24 mg/dL   Calcium 8.8 (L) 8.9 - 10.3 mg/dL   GFR, Estimated >60 >60 mL/min   Anion gap 12 5 - 15  Magnesium  Result Value Ref Range   Magnesium 2.6 (H) 1.7 - 2.4 mg/dL  CBC with Differential/Platelet  Result Value Ref Range   WBC 17.6 (H) 4.0 - 10.5 K/uL   RBC 4.89 4.22 - 5.81 MIL/uL   Hemoglobin 13.7 13.0 - 17.0 g/dL   HCT 41.8 39 - 52 %   MCV 85.5 80.0 - 100.0 fL   MCH 28.0 26.0 - 34.0 pg   MCHC 32.8 30.0 - 36.0 g/dL   RDW 15.2 11.5 - 15.5 %   Platelets 225 150 - 400 K/uL   nRBC 0.1 0.0 - 0.2 %   Neutrophils Relative % 76 %   Neutro Abs 13.5 (H) 1.7 - 7.7 K/uL   Lymphocytes Relative 8 %   Lymphs Abs 1.4 0.7 - 4.0 K/uL   Monocytes Relative 6 %   Monocytes Absolute 1.0 0.1 - 1.0 K/uL   Eosinophils Relative 0 %   Eosinophils Absolute 0.0 0.0 - 0.5  K/uL   Basophils Relative 1 %   Basophils Absolute 0.1 0.0 - 0.1 K/uL   WBC Morphology MILD LEFT SHIFT (1-5% METAS, OCC MYELO, OCC BANDS)    RBC Morphology MORPHOLOGY UNREMARKABLE    Smear Review Normal platelet morphology    Immature Granulocytes 9 %   Abs Immature Granulocytes 1.60 (H) 0.00 - 0.07 K/uL  Basic metabolic panel  Result Value Ref Range   Sodium 131 (L) 135 - 145 mmol/L   Potassium 4.8 3.5 - 5.1 mmol/L   Chloride 97 (L) 98 - 111 mmol/L   CO2 23 22 - 32 mmol/L   Glucose, Bld 138 (H) 70 - 99 mg/dL   BUN 23 (H) 6 - 20 mg/dL   Creatinine, Ser 0.81 0.61 - 1.24 mg/dL   Calcium 8.5 (L) 8.9 - 10.3 mg/dL   GFR, Estimated >60 >60 mL/min   Anion gap 11 5 - 15  Magnesium  Result Value Ref Range   Magnesium 2.6 (H) 1.7 - 2.4 mg/dL  Hepatic function panel  Result Value Ref Range   Total Protein 5.6 (L) 6.5 - 8.1 g/dL   Albumin 2.9 (L) 3.5 - 5.0 g/dL   AST 125 (H) 15 - 41 U/L   ALT 549 (H) 0 - 44 U/L   Alkaline Phosphatase 71 38 - 126 U/L   Total Bilirubin 0.9 0.3 - 1.2 mg/dL   Bilirubin, Direct 0.2 0.0 - 0.2 mg/dL   Indirect Bilirubin 0.7 0.3 - 0.9 mg/dL  Comprehensive metabolic panel  Result Value Ref Range   Sodium 132 (L) 135 - 145 mmol/L   Potassium 4.7 3.5 - 5.1 mmol/L   Chloride 99 98 - 111 mmol/L   CO2 25 22 - 32 mmol/L   Glucose, Bld 118 (H) 70 - 99 mg/dL   BUN 21 (H) 6 - 20 mg/dL   Creatinine, Ser 0.85 0.61 - 1.24 mg/dL   Calcium 8.8 (L) 8.9 - 10.3 mg/dL   Total Protein 5.8 (L) 6.5 - 8.1 g/dL   Albumin 2.9 (L) 3.5 - 5.0 g/dL   AST 123 (H) 15 - 41 U/L   ALT 537 (H) 0 - 44 U/L   Alkaline Phosphatase 77 38 - 126 U/L   Total Bilirubin 0.9 0.3 - 1.2 mg/dL   GFR, Estimated >60 >60 mL/min   Anion gap 8 5 - 15  CBC with Differential/Platelet  Result Value Ref Range   WBC 17.0 (H) 4.0 - 10.5 K/uL   RBC 4.83 4.22 - 5.81 MIL/uL   Hemoglobin 13.7 13.0 - 17.0 g/dL   HCT 42.0 39 - 52 %   MCV  87.0 80.0 - 100.0 fL   MCH 28.4 26.0 - 34.0 pg   MCHC 32.6 30.0 - 36.0  g/dL   RDW 15.7 (H) 11.5 - 15.5 %   Platelets 201 150 - 400 K/uL   nRBC 0.4 (H) 0.0 - 0.2 %   Neutrophils Relative % 76 %   Neutro Abs 12.8 (H) 1.7 - 7.7 K/uL   Lymphocytes Relative 9 %   Lymphs Abs 1.6 0.7 - 4.0 K/uL   Monocytes Relative 5 %   Monocytes Absolute 0.9 0.1 - 1.0 K/uL   Eosinophils Relative 0 %   Eosinophils Absolute 0.0 0.0 - 0.5 K/uL   Basophils Relative 1 %   Basophils Absolute 0.1 0.0 - 0.1 K/uL   WBC Morphology MILD LEFT SHIFT (1-5% METAS, OCC MYELO, OCC BANDS)    RBC Morphology MORPHOLOGY UNREMARKABLE    Smear Review Normal platelet morphology    nRBC 0 0 /100 WBC   Immature Granulocytes 9 %   Abs Immature Granulocytes 1.57 (H) 0.00 - 0.07 K/uL  Magnesium  Result Value Ref Range   Magnesium 2.4 1.7 - 2.4 mg/dL  Phosphorus  Result Value Ref Range   Phosphorus 3.6 2.5 - 4.6 mg/dL  Troponin I (High Sensitivity)  Result Value Ref Range   Troponin I (High Sensitivity) 11 <18 ng/L      Assessment & Plan:   Problem List Items Addressed This Visit    Pneumonia due to COVID-19 virus - Primary   Relevant Medications   Ipratropium-Albuterol (COMBIVENT RESPIMAT) 20-100 MCG/ACT AERS respimat   Other Relevant Orders   DG Chest 2 View (Completed)    Other Visit Diagnoses    Acute hypoxemic respiratory failure due to COVID-19 ([place])          Significant improvement now after completed hospitalization for COVID19 and pneumonia secondary complication Completed Remdesivir therapy in hospital Completed oral steroids Continues on Albuterol PRN, has combivent No formal diagnosis of COPD, imaging reviewed.  Continues HH PT OT  Secondary complication of uncertain etiology with Left ulnar nerve impingement with numbness, maybe medication side effect.  STAT Chest X-ray today - reviewed, improved bilateral pulmonary densities  6 min walk test performed today in office - status = FAILED, desat to 87% with 2 min walking CONTINUE Home Oxygen supplemental with exertion  PRN  F/u 1-2 weeks approx to do repeat 6 min walk test and determine if still indicated for oxygen we can write DC order if need  Completed short term disability paperwork, will anticipate may return at this time on 08/21/20. He has several restrictions at this time with only occasionally able to do lifting < 10 lbs, sedentary work status now and limited ambulation.  Will eval in office prior to release.  No orders of the defined types were placed in this encounter.     Follow up plan: Return in 13 days (on 08/19/2020), or if symptoms worsen or fail to improve, for 10-13 days approximately should be BEFORE 08/20/20 for return to work.[name], DO [facility] 08/06/2020, 2:05 PM

## 2020-08-14 ENCOUNTER — Ambulatory Visit: Payer: PRIVATE HEALTH INSURANCE | Admitting: Family Medicine

## 2020-08-19 ENCOUNTER — Encounter: Payer: Self-pay | Admitting: Family Medicine

## 2020-08-19 ENCOUNTER — Other Ambulatory Visit: Payer: Self-pay

## 2020-08-19 ENCOUNTER — Ambulatory Visit (INDEPENDENT_AMBULATORY_CARE_PROVIDER_SITE_OTHER): Payer: PRIVATE HEALTH INSURANCE | Admitting: Family Medicine

## 2020-08-19 VITALS — BP 139/88 | HR 96 | Temp 97.5°F | Resp 16 | Ht 66.0 in | Wt 231.6 lb

## 2020-08-19 DIAGNOSIS — R200 Anesthesia of skin: Secondary | ICD-10-CM

## 2020-08-19 DIAGNOSIS — U071 COVID-19: Secondary | ICD-10-CM

## 2020-08-19 DIAGNOSIS — J4530 Mild persistent asthma, uncomplicated: Secondary | ICD-10-CM

## 2020-08-19 DIAGNOSIS — J1282 Pneumonia due to coronavirus disease 2019: Secondary | ICD-10-CM

## 2020-08-19 DIAGNOSIS — R202 Paresthesia of skin: Secondary | ICD-10-CM

## 2020-08-19 DIAGNOSIS — J9601 Acute respiratory failure with hypoxia: Secondary | ICD-10-CM

## 2020-08-19 MED ORDER — ALBUTEROL SULFATE HFA 108 (90 BASE) MCG/ACT IN AERS: 2.0000 | INHALATION_SPRAY | RESPIRATORY_TRACT | 3 refills | Status: DC | PRN

## 2020-08-19 MED ORDER — FLUTICASONE-SALMETEROL 250-50 MCG/DOSE IN AEPB: 1.0000 | INHALATION_SPRAY | Freq: Two times a day (BID) | RESPIRATORY_TRACT | 5 refills | Status: DC

## 2020-08-19 NOTE — Assessment & Plan Note (Signed)
Resolved. Hospitalized previously, has completed therapy Now completed outpatient therapy and oxygen supplement. 6 min walk test today passed, no indication for supplemental oxygen at this time  - See results. 98% at rest on room air, 94% walking 6 min on room air - Will DC oxygen order handwritten fax to [place] once patient provides that info to our office to pick up O2 - Complete paperwork for short term disability he can return to work without restrictions on 08/21/20

## 2020-08-19 NOTE — Patient Instructions (Addendum)
Thank you for coming to the office today.  Let our office know which company for the supplemental oxygen so we can discontinue it. We can fax a DC order.  [facility] - Neurology Dept [place], [place] [address] Phone: [phone]  Appt referral sent for neurology for hand numbness - stay tuned for apt.  Return to work on 08/21/20 - without restrictions.  Refilled Advair and Albuterol   Please schedule a Follow-up Appointment to: Return if symptoms worsen or fail to improve.  If you have any other questions or concerns, please feel free to call the office or send a message through [place]. You may also schedule an earlier appointment if necessary.  Additionally, you may be receiving a survey about your experience at our office within a few days to 1 week by e-mail or mail. We value your feedback.  [name], DO [facility]

## 2020-08-19 NOTE — Progress Notes (Signed)
Subjective:    Patient ID: Derek Cook, male    DOB: [birth year], 59 y.o.   MRN: [id number]  Derek Cook is a 59 y.o. male presenting on 08/19/2020 for Hospitalization Follow-up (Pneumonia--6 minute walk--at rest oxy 98 and after 6 min walk 94 % and pulse 117 bpm.)   HPI  Follow-up Post COVID Pneumonia Date of Admission:06/25/20 Date of Discharge:07/14/20 Treated with Remdesivir  Improved overall but still has episodic symptoms with dyspnea  He describes complications or issues with Left hand 4th and 5th fingers being numb, he says started in hospital after treatment through IV in hospital.  [facility] PT, helping him improve with elliptical machine, he is improving his breathing function and not having issue with oxygen  Now he is mostly off oxygen. Here today for 6 min walk test as well goal to DC oxygen today  He plans to return to work as scheduled for 08/21/20  He is walking more overall as well.  Using albuterol PRN Needs repeat order Advair inhaler maintenance  He is unsure which company supplies the oxygen, he will let us know  Allergies / Urticaria Reports recent trigger with couch, now resolved  Depression screen [facility] 2/9 08/19/2020 08/06/2020 07/22/2020  Decreased Interest 0 0 0  Down, Depressed, Hopeless 0 0 0  PHQ - 2 Score 0 0 0    Social History   Tobacco Use  . Smoking status: Former Smoker    Quit date: 09/07/2009    Years since quitting: 10.9  . Smokeless tobacco: Former Network engineer  . Vaping Use: Never used  Substance Use Topics  . Alcohol use: No  . Drug use: No    Review of Systems Per HPI unless specifically indicated above     Objective:    BP 139/88   Pulse 96   Temp (!) 97.5 F (36.4 C) (Temporal)   Resp 16   Ht 5\' 6"  (1.676 m)   Wt 231 lb 9.6 oz (105.1 kg)   SpO2 98%   BMI 37.38 kg/m   Wt Readings from Last 3 Encounters:  08/19/20 231 lb 9.6 oz (105.1 kg)  08/06/20 229 lb 9.6 oz (104.1 kg)  07/14/20 231 lb 11.3  oz (105.1 kg)    6 minute walk test  At rest on room air - 98% Walking 6 min on room air - 94%  Physical Exam Vitals and nursing note reviewed.  Constitutional:      General: He is not in acute distress.    Appearance: He is well-developed and well-nourished. He is not diaphoretic.     Comments: Well-appearing, comfortable, cooperative  HENT:     Head: Normocephalic and atraumatic.     Mouth/Throat:     Mouth: Oropharynx is clear and moist.  Eyes:     General:        Right eye: No discharge.        Left eye: No discharge.     Conjunctiva/sclera: Conjunctivae normal.  Neck:     Thyroid: No thyromegaly.  Cardiovascular:     Rate and Rhythm: Normal rate and regular rhythm.     Pulses: Intact distal pulses.     Heart sounds: Normal heart sounds. No murmur heard.   Pulmonary:     Effort: Pulmonary effort is normal. No respiratory distress.     Breath sounds: Normal breath sounds. No wheezing or rales.  Musculoskeletal:        General: No edema. Normal range of motion.  Cervical back: Normal range of motion and neck supple.  Lymphadenopathy:     Cervical: No cervical adenopathy.  Skin:    General: Skin is warm and dry.     Findings: No erythema or rash.  Neurological:     Mental Status: He is alert and oriented to person, place, and time.  Psychiatric:        Mood and Affect: Mood and affect normal.        Behavior: Behavior normal.     Comments: Well groomed, good eye contact, normal speech and thoughts       Results for orders placed or performed during the hospital encounter of 06/25/20  CBC with Differential  Result Value Ref Range   WBC 7.5 4.0 - 10.5 K/uL   RBC 5.29 4.22 - 5.81 MIL/uL   Hemoglobin 14.7 13.0 - 17.0 g/dL   HCT 43.2 39.0 - 52.0 %   MCV 81.7 80.0 - 100.0 fL   MCH 27.8 26.0 - 34.0 pg   MCHC 34.0 30.0 - 36.0 g/dL   RDW 14.6 11.5 - 15.5 %   Platelets 242 150 - 400 K/uL   nRBC 0.0 0.0 - 0.2 %   Neutrophils Relative % 85 %   Neutro Abs 6.4  1.7 - 7.7 K/uL   Lymphocytes Relative 9 %   Lymphs Abs 0.7 0.7 - 4.0 K/uL   Monocytes Relative 6 %   Monocytes Absolute 0.4 0.1 - 1.0 K/uL   Eosinophils Relative 0 %   Eosinophils Absolute 0.0 0.0 - 0.5 K/uL   Basophils Relative 0 %   Basophils Absolute 0.0 0.0 - 0.1 K/uL   Immature Granulocytes 0 %   Abs Immature Granulocytes 0.02 0.00 - 0.07 K/uL  Comprehensive metabolic panel  Result Value Ref Range   Sodium 134 (L) 135 - 145 mmol/L   Potassium 4.2 3.5 - 5.1 mmol/L   Chloride 100 98 - 111 mmol/L   CO2 22 22 - 32 mmol/L   Glucose, Bld 120 (H) 70 - 99 mg/dL   BUN 20 6 - 20 mg/dL   Creatinine, Ser 0.93 0.61 - 1.24 mg/dL   Calcium 8.6 (L) 8.9 - 10.3 mg/dL   Total Protein 7.5 6.5 - 8.1 g/dL   Albumin 3.4 (L) 3.5 - 5.0 g/dL   AST 129 (H) 15 - 41 U/L   ALT 75 (H) 0 - 44 U/L   Alkaline Phosphatase 57 38 - 126 U/L   Total Bilirubin 1.1 0.3 - 1.2 mg/dL   GFR, Estimated >60 >60 mL/min   Anion gap 12 5 - 15  Lipase, blood  Result Value Ref Range   Lipase 29 11 - 51 U/L  Lactic acid, plasma  Result Value Ref Range   Lactic Acid, Venous 1.8 0.5 - 1.9 mmol/L  Urinalysis, Complete w Microscopic  Result Value Ref Range   Color, Urine YELLOW (A) YELLOW   APPearance CLEAR (A) CLEAR   Specific Gravity, Urine 1.019 1.005 - 1.030   pH 6.0 5.0 - 8.0   Glucose, UA NEGATIVE NEGATIVE mg/dL   Hgb urine dipstick NEGATIVE NEGATIVE   Bilirubin Urine NEGATIVE NEGATIVE   Ketones, ur 20 (A) NEGATIVE mg/dL   Protein, ur 30 (A) NEGATIVE mg/dL   Nitrite NEGATIVE NEGATIVE   Leukocytes,Ua NEGATIVE NEGATIVE   RBC / HPF 0-5 0 - 5 RBC/hpf   WBC, UA 0-5 0 - 5 WBC/hpf   Bacteria, UA NONE SEEN NONE SEEN   Squamous Epithelial / LPF NONE SEEN 0 -  5   Mucus PRESENT    Hyaline Casts, UA PRESENT   HIV Antibody (routine testing w rflx)  Result Value Ref Range   HIV Screen 4th Generation wRfx Non Reactive Non Reactive  Comprehensive metabolic panel  Result Value Ref Range   Sodium 138 135 - 145 mmol/L    Potassium 4.1 3.5 - 5.1 mmol/L   Chloride 103 98 - 111 mmol/L   CO2 22 22 - 32 mmol/L   Glucose, Bld 147 (H) 70 - 99 mg/dL   BUN 21 (H) 6 - 20 mg/dL   Creatinine, Ser 0.98 0.61 - 1.24 mg/dL   Calcium 8.7 (L) 8.9 - 10.3 mg/dL   Total Protein 6.7 6.5 - 8.1 g/dL   Albumin 3.2 (L) 3.5 - 5.0 g/dL   AST 105 (H) 15 - 41 U/L   ALT 70 (H) 0 - 44 U/L   Alkaline Phosphatase 60 38 - 126 U/L   Total Bilirubin 0.9 0.3 - 1.2 mg/dL   GFR, Estimated >60 >60 mL/min   Anion gap 13 5 - 15  CBC WITH DIFFERENTIAL  Result Value Ref Range   WBC 3.8 (L) 4.0 - 10.5 K/uL   RBC 5.06 4.22 - 5.81 MIL/uL   Hemoglobin 14.2 13.0 - 17.0 g/dL   HCT 42.6 39.0 - 52.0 %   MCV 84.2 80.0 - 100.0 fL   MCH 28.1 26.0 - 34.0 pg   MCHC 33.3 30.0 - 36.0 g/dL   RDW 14.7 11.5 - 15.5 %   Platelets 222 150 - 400 K/uL   nRBC 0.0 0.0 - 0.2 %   Neutrophils Relative % 77 %   Neutro Abs 3.0 1.7 - 7.7 K/uL   Lymphocytes Relative 15 %   Lymphs Abs 0.6 (L) 0.7 - 4.0 K/uL   Monocytes Relative 7 %   Monocytes Absolute 0.3 0.1 - 1.0 K/uL   Eosinophils Relative 0 %   Eosinophils Absolute 0.0 0.0 - 0.5 K/uL   Basophils Relative 0 %   Basophils Absolute 0.0 0.0 - 0.1 K/uL   Immature Granulocytes 1 %   Abs Immature Granulocytes 0.02 0.00 - 0.07 K/uL  Magnesium  Result Value Ref Range   Magnesium 2.4 1.7 - 2.4 mg/dL  Phosphorus  Result Value Ref Range   Phosphorus 4.2 2.5 - 4.6 mg/dL  Blood gas, arterial  Result Value Ref Range   FIO2 0.40    Delivery systems NASAL CANNULA    pH, Arterial 7.42 7.350 - 7.450   pCO2 arterial 33 32.0 - 48.0 mmHg   pO2, Arterial 63 (L) 83.0 - 108.0 mmHg   Bicarbonate 21.4 20.0 - 28.0 mmol/L   Acid-base deficit 2.2 (H) 0.0 - 2.0 mmol/L   O2 Saturation 92.2 %   Patient temperature 37.0    Collection site RIGHT RADIAL    Sample type ARTERIAL DRAW    Allens test (pass/fail) PASS PASS  C-reactive protein  Result Value Ref Range   CRP 17.3 (H) <1.0 mg/dL  Ferritin  Result Value Ref Range    Ferritin 1,626 (H) 24 - 336 ng/mL  Fibrinogen  Result Value Ref Range   Fibrinogen 726 (H) 210 - 475 mg/dL  Lactate dehydrogenase  Result Value Ref Range   LDH 459 (H) 98 - 192 U/L  Magnesium  Result Value Ref Range   Magnesium 2.5 (H) 1.7 - 2.4 mg/dL  Procalcitonin - Baseline  Result Value Ref Range   Procalcitonin 0.63 ng/mL  C-reactive protein  Result Value Ref Range  CRP 8.4 (H) <1.0 mg/dL  Procalcitonin  Result Value Ref Range   Procalcitonin 0.51 ng/mL  CBC  Result Value Ref Range   WBC 8.4 4.0 - 10.5 K/uL   RBC 4.87 4.22 - 5.81 MIL/uL   Hemoglobin 13.9 13.0 - 17.0 g/dL   HCT 41.3 39.0 - 52.0 %   MCV 84.8 80.0 - 100.0 fL   MCH 28.5 26.0 - 34.0 pg   MCHC 33.7 30.0 - 36.0 g/dL   RDW 14.5 11.5 - 15.5 %   Platelets 299 150 - 400 K/uL   nRBC 0.0 0.0 - 0.2 %  Comprehensive metabolic panel  Result Value Ref Range   Sodium 138 135 - 145 mmol/L   Potassium 4.5 3.5 - 5.1 mmol/L   Chloride 105 98 - 111 mmol/L   CO2 20 (L) 22 - 32 mmol/L   Glucose, Bld 134 (H) 70 - 99 mg/dL   BUN 21 (H) 6 - 20 mg/dL   Creatinine, Ser 0.74 0.61 - 1.24 mg/dL   Calcium 8.3 (L) 8.9 - 10.3 mg/dL   Total Protein 6.5 6.5 - 8.1 g/dL   Albumin 3.0 (L) 3.5 - 5.0 g/dL   AST 75 (H) 15 - 41 U/L   ALT 72 (H) 0 - 44 U/L   Alkaline Phosphatase 63 38 - 126 U/L   Total Bilirubin 0.9 0.3 - 1.2 mg/dL   GFR, Estimated >60 >60 mL/min   Anion gap 13 5 - 15  Magnesium  Result Value Ref Range   Magnesium 2.6 (H) 1.7 - 2.4 mg/dL  Fibrin derivatives D-Dimer ([facility] only)  Result Value Ref Range   Fibrin derivatives D-dimer ([facility]) 3,157.14 (H) 0.00 - 499.00 ng/mL (FEU)  Glucose, capillary  Result Value Ref Range   Glucose-Capillary 128 (H) 70 - 99 mg/dL  C-reactive protein  Result Value Ref Range   CRP 3.2 (H) <1.0 mg/dL  Procalcitonin  Result Value Ref Range   Procalcitonin 0.25 ng/mL  Comprehensive metabolic panel  Result Value Ref Range   Sodium 136 135 - 145 mmol/L   Potassium 4.4 3.5 - 5.1 mmol/L    Chloride 104 98 - 111 mmol/L   CO2 22 22 - 32 mmol/L   Glucose, Bld 135 (H) 70 - 99 mg/dL   BUN 25 (H) 6 - 20 mg/dL   Creatinine, Ser 0.80 0.61 - 1.24 mg/dL   Calcium 8.5 (L) 8.9 - 10.3 mg/dL   Total Protein 6.4 (L) 6.5 - 8.1 g/dL   Albumin 3.0 (L) 3.5 - 5.0 g/dL   AST 46 (H) 15 - 41 U/L   ALT 69 (H) 0 - 44 U/L   Alkaline Phosphatase 64 38 - 126 U/L   Total Bilirubin 1.0 0.3 - 1.2 mg/dL   GFR, Estimated >60 >60 mL/min   Anion gap 10 5 - 15  Fibrin derivatives D-Dimer ([facility] only)  Result Value Ref Range   Fibrin derivatives D-dimer ([facility]) >7,500.00 (H) 0.00 - 499.00 ng/mL (FEU)  C-reactive protein  Result Value Ref Range   CRP 1.3 (H) <1.0 mg/dL  Comprehensive metabolic panel  Result Value Ref Range   Sodium 134 (L) 135 - 145 mmol/L   Potassium 4.6 3.5 - 5.1 mmol/L   Chloride 103 98 - 111 mmol/L   CO2 23 22 - 32 mmol/L   Glucose, Bld 160 (H) 70 - 99 mg/dL   BUN 26 (H) 6 - 20 mg/dL   Creatinine, Ser 0.94 0.61 - 1.24 mg/dL   Calcium 8.3 (L) 8.9 -  10.3 mg/dL   Total Protein 6.1 (L) 6.5 - 8.1 g/dL   Albumin 3.0 (L) 3.5 - 5.0 g/dL   AST 42 (H) 15 - 41 U/L   ALT 69 (H) 0 - 44 U/L   Alkaline Phosphatase 57 38 - 126 U/L   Total Bilirubin 0.9 0.3 - 1.2 mg/dL   GFR, Estimated >60 >60 mL/min   Anion gap 8 5 - 15  Fibrin derivatives D-Dimer ([facility] only)  Result Value Ref Range   Fibrin derivatives D-dimer ([facility]) 5,539.67 (H) 0.00 - 499.00 ng/mL (FEU)  C-reactive protein  Result Value Ref Range   CRP 0.6 <1.0 mg/dL  Comprehensive metabolic panel  Result Value Ref Range   Sodium 134 (L) 135 - 145 mmol/L   Potassium 4.7 3.5 - 5.1 mmol/L   Chloride 103 98 - 111 mmol/L   CO2 24 22 - 32 mmol/L   Glucose, Bld 148 (H) 70 - 99 mg/dL   BUN 25 (H) 6 - 20 mg/dL   Creatinine, Ser 0.88 0.61 - 1.24 mg/dL   Calcium 8.4 (L) 8.9 - 10.3 mg/dL   Total Protein 6.1 (L) 6.5 - 8.1 g/dL   Albumin 3.1 (L) 3.5 - 5.0 g/dL   AST 43 (H) 15 - 41 U/L   ALT 81 (H) 0 - 44 U/L   Alkaline Phosphatase 55  38 - 126 U/L   Total Bilirubin 1.0 0.3 - 1.2 mg/dL   GFR, Estimated >60 >60 mL/min   Anion gap 7 5 - 15  Fibrin derivatives D-Dimer ([facility] only)  Result Value Ref Range   Fibrin derivatives D-dimer ([facility]) 3,263.67 (H) 0.00 - 499.00 ng/mL (FEU)  C-reactive protein  Result Value Ref Range   CRP 0.5 <1.0 mg/dL  Comprehensive metabolic panel  Result Value Ref Range   Sodium 132 (L) 135 - 145 mmol/L   Potassium 4.4 3.5 - 5.1 mmol/L   Chloride 100 98 - 111 mmol/L   CO2 23 22 - 32 mmol/L   Glucose, Bld 126 (H) 70 - 99 mg/dL   BUN 26 (H) 6 - 20 mg/dL   Creatinine, Ser 1.07 0.61 - 1.24 mg/dL   Calcium 8.8 (L) 8.9 - 10.3 mg/dL   Total Protein 6.4 (L) 6.5 - 8.1 g/dL   Albumin 3.2 (L) 3.5 - 5.0 g/dL   AST 37 15 - 41 U/L   ALT 85 (H) 0 - 44 U/L   Alkaline Phosphatase 55 38 - 126 U/L   Total Bilirubin 1.0 0.3 - 1.2 mg/dL   GFR, Estimated >60 >60 mL/min   Anion gap 9 5 - 15  Fibrin derivatives D-Dimer ([facility] only)  Result Value Ref Range   Fibrin derivatives D-dimer ([facility]) 2,335.87 (H) 0.00 - 499.00 ng/mL (FEU)  C-reactive protein  Result Value Ref Range   CRP 1.7 (H) <1.0 mg/dL  Comprehensive metabolic panel  Result Value Ref Range   Sodium 133 (L) 135 - 145 mmol/L   Potassium 4.5 3.5 - 5.1 mmol/L   Chloride 103 98 - 111 mmol/L   CO2 21 (L) 22 - 32 mmol/L   Glucose, Bld 143 (H) 70 - 99 mg/dL   BUN 24 (H) 6 - 20 mg/dL   Creatinine, Ser 0.90 0.61 - 1.24 mg/dL   Calcium 8.5 (L) 8.9 - 10.3 mg/dL   Total Protein 6.2 (L) 6.5 - 8.1 g/dL   Albumin 3.1 (L) 3.5 - 5.0 g/dL   AST 41 15 - 41 U/L   ALT 92 (H) 0 - 44  U/L   Alkaline Phosphatase 62 38 - 126 U/L   Total Bilirubin 1.0 0.3 - 1.2 mg/dL   GFR, Estimated >60 >60 mL/min   Anion gap 9 5 - 15  Fibrin derivatives D-Dimer ([facility] only)  Result Value Ref Range   Fibrin derivatives D-dimer ([facility]) 1,997.30 (H) 0.00 - 499.00 ng/mL (FEU)  CBC  Result Value Ref Range   WBC 14.3 (H) 4.0 - 10.5 K/uL   RBC 5.29 4.22 - 5.81 MIL/uL   Hemoglobin  14.9 13.0 - 17.0 g/dL   HCT 44.3 39.0 - 52.0 %   MCV 83.7 80.0 - 100.0 fL   MCH 28.2 26.0 - 34.0 pg   MCHC 33.6 30.0 - 36.0 g/dL   RDW 14.2 11.5 - 15.5 %   Platelets 590 (H) 150 - 400 K/uL   nRBC 0.0 0.0 - 0.2 %  C-reactive protein  Result Value Ref Range   CRP 0.5 <1.0 mg/dL  Comprehensive metabolic panel  Result Value Ref Range   Sodium 132 (L) 135 - 145 mmol/L   Potassium 4.4 3.5 - 5.1 mmol/L   Chloride 101 98 - 111 mmol/L   CO2 23 22 - 32 mmol/L   Glucose, Bld 114 (H) 70 - 99 mg/dL   BUN 22 (H) 6 - 20 mg/dL   Creatinine, Ser 0.74 0.61 - 1.24 mg/dL   Calcium 8.5 (L) 8.9 - 10.3 mg/dL   Total Protein 6.1 (L) 6.5 - 8.1 g/dL   Albumin 3.1 (L) 3.5 - 5.0 g/dL   AST 34 15 - 41 U/L   ALT 90 (H) 0 - 44 U/L   Alkaline Phosphatase 50 38 - 126 U/L   Total Bilirubin 0.8 0.3 - 1.2 mg/dL   GFR, Estimated >60 >60 mL/min   Anion gap 8 5 - 15  Fibrin derivatives D-Dimer ([facility] only)  Result Value Ref Range   Fibrin derivatives D-dimer ([facility]) 1,462.84 (H) 0.00 - 499.00 ng/mL (FEU)  Comprehensive metabolic panel  Result Value Ref Range   Sodium 131 (L) 135 - 145 mmol/L   Potassium 4.6 3.5 - 5.1 mmol/L   Chloride 98 98 - 111 mmol/L   CO2 23 22 - 32 mmol/L   Glucose, Bld 134 (H) 70 - 99 mg/dL   BUN 27 (H) 6 - 20 mg/dL   Creatinine, Ser 0.95 0.61 - 1.24 mg/dL   Calcium 8.5 (L) 8.9 - 10.3 mg/dL   Total Protein 6.3 (L) 6.5 - 8.1 g/dL   Albumin 3.2 (L) 3.5 - 5.0 g/dL   AST 43 (H) 15 - 41 U/L   ALT 98 (H) 0 - 44 U/L   Alkaline Phosphatase 58 38 - 126 U/L   Total Bilirubin 1.1 0.3 - 1.2 mg/dL   GFR, Estimated >60 >60 mL/min   Anion gap 10 5 - 15  Fibrin derivatives D-Dimer ([facility] only)  Result Value Ref Range   Fibrin derivatives D-dimer ([facility]) 1,263.46 (H) 0.00 - 499.00 ng/mL (FEU)  Comprehensive metabolic panel  Result Value Ref Range   Sodium 131 (L) 135 - 145 mmol/L   Potassium 4.9 3.5 - 5.1 mmol/L   Chloride 98 98 - 111 mmol/L   CO2 24 22 - 32 mmol/L   Glucose, Bld 143 (H) 70 -  99 mg/dL   BUN 26 (H) 6 - 20 mg/dL   Creatinine, Ser 0.98 0.61 - 1.24 mg/dL   Calcium 8.8 (L) 8.9 - 10.3 mg/dL   Total Protein 6.4 (L) 6.5 - 8.1 g/dL   Albumin  3.3 (L) 3.5 - 5.0 g/dL   AST 42 (H) 15 - 41 U/L   ALT 106 (H) 0 - 44 U/L   Alkaline Phosphatase 61 38 - 126 U/L   Total Bilirubin 1.3 (H) 0.3 - 1.2 mg/dL   GFR, Estimated >60 >60 mL/min   Anion gap 9 5 - 15  Fibrin derivatives D-Dimer ([facility] only)  Result Value Ref Range   Fibrin derivatives D-dimer ([facility]) 1,085.87 (H) 0.00 - 499.00 ng/mL (FEU)  CBC  Result Value Ref Range   WBC 18.4 (H) 4.0 - 10.5 K/uL   RBC 5.31 4.22 - 5.81 MIL/uL   Hemoglobin 15.2 13.0 - 17.0 g/dL   HCT 44.5 39.0 - 52.0 %   MCV 83.8 80.0 - 100.0 fL   MCH 28.6 26.0 - 34.0 pg   MCHC 34.2 30.0 - 36.0 g/dL   RDW 14.4 11.5 - 15.5 %   Platelets 557 (H) 150 - 400 K/uL   nRBC 0.1 0.0 - 0.2 %  Comprehensive metabolic panel  Result Value Ref Range   Sodium 130 (L) 135 - 145 mmol/L   Potassium 4.8 3.5 - 5.1 mmol/L   Chloride 94 (L) 98 - 111 mmol/L   CO2 26 22 - 32 mmol/L   Glucose, Bld 137 (H) 70 - 99 mg/dL   BUN 29 (H) 6 - 20 mg/dL   Creatinine, Ser 0.93 0.61 - 1.24 mg/dL   Calcium 8.9 8.9 - 10.3 mg/dL   Total Protein 6.1 (L) 6.5 - 8.1 g/dL   Albumin 3.2 (L) 3.5 - 5.0 g/dL   AST 54 (H) 15 - 41 U/L   ALT 143 (H) 0 - 44 U/L   Alkaline Phosphatase 61 38 - 126 U/L   Total Bilirubin 1.0 0.3 - 1.2 mg/dL   GFR, Estimated >60 >60 mL/min   Anion gap 10 5 - 15  Magnesium  Result Value Ref Range   Magnesium 2.6 (H) 1.7 - 2.4 mg/dL  Comprehensive metabolic panel  Result Value Ref Range   Sodium 133 (L) 135 - 145 mmol/L   Potassium 4.4 3.5 - 5.1 mmol/L   Chloride 96 (L) 98 - 111 mmol/L   CO2 25 22 - 32 mmol/L   Glucose, Bld 145 (H) 70 - 99 mg/dL   BUN 33 (H) 6 - 20 mg/dL   Creatinine, Ser 1.09 0.61 - 1.24 mg/dL   Calcium 8.5 (L) 8.9 - 10.3 mg/dL   Total Protein 5.8 (L) 6.5 - 8.1 g/dL   Albumin 3.1 (L) 3.5 - 5.0 g/dL   AST 73 (H) 15 - 41 U/L   ALT 179  (H) 0 - 44 U/L   Alkaline Phosphatase 63 38 - 126 U/L   Total Bilirubin 1.1 0.3 - 1.2 mg/dL   GFR, Estimated >60 >60 mL/min   Anion gap 12 5 - 15  CBC  Result Value Ref Range   WBC 18.4 (H) 4.0 - 10.5 K/uL   RBC 5.18 4.22 - 5.81 MIL/uL   Hemoglobin 14.8 13.0 - 17.0 g/dL   HCT 43.8 39.0 - 52.0 %   MCV 84.6 80.0 - 100.0 fL   MCH 28.6 26.0 - 34.0 pg   MCHC 33.8 30.0 - 36.0 g/dL   RDW 14.5 11.5 - 15.5 %   Platelets 481 (H) 150 - 400 K/uL   nRBC 0.0 0.0 - 0.2 %  Comprehensive metabolic panel  Result Value Ref Range   Sodium 132 (L) 135 - 145 mmol/L   Potassium 4.6 3.5 -  5.1 mmol/L   Chloride 93 (L) 98 - 111 mmol/L   CO2 27 22 - 32 mmol/L   Glucose, Bld 132 (H) 70 - 99 mg/dL   BUN 33 (H) 6 - 20 mg/dL   Creatinine, Ser 1.01 0.61 - 1.24 mg/dL   Calcium 8.9 8.9 - 10.3 mg/dL   Total Protein 6.1 (L) 6.5 - 8.1 g/dL   Albumin 3.2 (L) 3.5 - 5.0 g/dL   AST 108 (H) 15 - 41 U/L   ALT 281 (H) 0 - 44 U/L   Alkaline Phosphatase 62 38 - 126 U/L   Total Bilirubin 1.2 0.3 - 1.2 mg/dL   GFR, Estimated >60 >60 mL/min   Anion gap 12 5 - 15  Comprehensive metabolic panel  Result Value Ref Range   Sodium 130 (L) 135 - 145 mmol/L   Potassium 4.6 3.5 - 5.1 mmol/L   Chloride 91 (L) 98 - 111 mmol/L   CO2 27 22 - 32 mmol/L   Glucose, Bld 117 (H) 70 - 99 mg/dL   BUN 31 (H) 6 - 20 mg/dL   Creatinine, Ser 0.94 0.61 - 1.24 mg/dL   Calcium 8.4 (L) 8.9 - 10.3 mg/dL   Total Protein 5.9 (L) 6.5 - 8.1 g/dL   Albumin 3.2 (L) 3.5 - 5.0 g/dL   AST 188 (H) 15 - 41 U/L   ALT 495 (H) 0 - 44 U/L   Alkaline Phosphatase 72 38 - 126 U/L   Total Bilirubin 1.2 0.3 - 1.2 mg/dL   GFR, Estimated >60 >60 mL/min   Anion gap 12 5 - 15  CBC with Differential/Platelet  Result Value Ref Range   WBC 17.7 (H) 4.0 - 10.5 K/uL   RBC 5.03 4.22 - 5.81 MIL/uL   Hemoglobin 14.5 13.0 - 17.0 g/dL   HCT 43.0 39.0 - 52.0 %   MCV 85.5 80.0 - 100.0 fL   MCH 28.8 26.0 - 34.0 pg   MCHC 33.7 30.0 - 36.0 g/dL   RDW 14.8 11.5 - 15.5 %    Platelets 351 150 - 400 K/uL   nRBC 0.0 0.0 - 0.2 %   Neutrophils Relative % 79 %   Neutro Abs 14.0 (H) 1.7 - 7.7 K/uL   Lymphocytes Relative 6 %   Lymphs Abs 1.0 0.7 - 4.0 K/uL   Monocytes Relative 10 %   Monocytes Absolute 1.8 (H) 0.1 - 1.0 K/uL   Eosinophils Relative 0 %   Eosinophils Absolute 0.0 0.0 - 0.5 K/uL   Basophils Relative 0 %   Basophils Absolute 0.0 0.0 - 0.1 K/uL   Immature Granulocytes 5 %   Abs Immature Granulocytes 0.83 (H) 0.00 - 0.07 K/uL  Comprehensive metabolic panel  Result Value Ref Range   Sodium 133 (L) 135 - 145 mmol/L   Potassium 4.6 3.5 - 5.1 mmol/L   Chloride 91 (L) 98 - 111 mmol/L   CO2 26 22 - 32 mmol/L   Glucose, Bld 160 (H) 70 - 99 mg/dL   BUN 35 (H) 6 - 20 mg/dL   Creatinine, Ser 1.00 0.61 - 1.24 mg/dL   Calcium 9.1 8.9 - 10.3 mg/dL   Total Protein 5.9 (L) 6.5 - 8.1 g/dL   Albumin 3.1 (L) 3.5 - 5.0 g/dL   AST 153 (H) 15 - 41 U/L   ALT 496 (H) 0 - 44 U/L   Alkaline Phosphatase 77 38 - 126 U/L   Total Bilirubin 1.4 (H) 0.3 - 1.2 mg/dL   GFR, Estimated >  60 >60 mL/min   Anion gap 16 (H) 5 - 15  Hepatitis panel, acute  Result Value Ref Range   Hepatitis B Surface Ag NON REACTIVE NON REACTIVE   HCV Ab NON REACTIVE NON REACTIVE   Hep A IgM NON REACTIVE NON REACTIVE   Hep B C IgM NON REACTIVE NON REACTIVE  Procalcitonin - Baseline  Result Value Ref Range   Procalcitonin 0.30 ng/mL  Brain natriuretic peptide  Result Value Ref Range   B Natriuretic Peptide 82.3 0.0 - 100.0 pg/mL  Basic metabolic panel  Result Value Ref Range   Sodium 131 (L) 135 - 145 mmol/L   Potassium 5.1 3.5 - 5.1 mmol/L   Chloride 90 (L) 98 - 111 mmol/L   CO2 27 22 - 32 mmol/L   Glucose, Bld 124 (H) 70 - 99 mg/dL   BUN 30 (H) 6 - 20 mg/dL   Creatinine, Ser 1.00 0.61 - 1.24 mg/dL   Calcium 8.6 (L) 8.9 - 10.3 mg/dL   GFR, Estimated >60 >60 mL/min   Anion gap 14 5 - 15  Magnesium  Result Value Ref Range   Magnesium 2.7 (H) 1.7 - 2.4 mg/dL  CBC with  Differential/Platelet  Result Value Ref Range   WBC 18.9 (H) 4.0 - 10.5 K/uL   RBC 5.03 4.22 - 5.81 MIL/uL   Hemoglobin 14.4 13.0 - 17.0 g/dL   HCT 43.1 39.0 - 52.0 %   MCV 85.7 80.0 - 100.0 fL   MCH 28.6 26.0 - 34.0 pg   MCHC 33.4 30.0 - 36.0 g/dL   RDW 15.1 11.5 - 15.5 %   Platelets 314 150 - 400 K/uL   nRBC 0.0 0.0 - 0.2 %   Neutrophils Relative % 76 %   Neutro Abs 14.5 (H) 1.7 - 7.7 K/uL   Lymphocytes Relative 8 %   Lymphs Abs 1.5 0.7 - 4.0 K/uL   Monocytes Relative 9 %   Monocytes Absolute 1.6 (H) 0.1 - 1.0 K/uL   Eosinophils Relative 0 %   Eosinophils Absolute 0.0 0.0 - 0.5 K/uL   Basophils Relative 1 %   Basophils Absolute 0.1 0.0 - 0.1 K/uL   WBC Morphology MORPHOLOGY UNREMARKABLE    RBC Morphology MORPHOLOGY UNREMARKABLE    Smear Review Normal platelet morphology    Immature Granulocytes 6 %   Abs Immature Granulocytes 1.15 (H) 0.00 - 0.07 K/uL  Hepatic function panel  Result Value Ref Range   Total Protein 5.8 (L) 6.5 - 8.1 g/dL   Albumin 2.9 (L) 3.5 - 5.0 g/dL   AST 163 (H) 15 - 41 U/L   ALT 547 (H) 0 - 44 U/L   Alkaline Phosphatase 71 38 - 126 U/L   Total Bilirubin 1.4 (H) 0.3 - 1.2 mg/dL   Bilirubin, Direct 0.3 (H) 0.0 - 0.2 mg/dL   Indirect Bilirubin 1.1 (H) 0.3 - 0.9 mg/dL  CBC with Differential/Platelet  Result Value Ref Range   WBC 17.2 (H) 4.0 - 10.5 K/uL   RBC 4.75 4.22 - 5.81 MIL/uL   Hemoglobin 13.4 13.0 - 17.0 g/dL   HCT 41.4 39.0 - 52.0 %   MCV 87.2 80.0 - 100.0 fL   MCH 28.2 26.0 - 34.0 pg   MCHC 32.4 30.0 - 36.0 g/dL   RDW 15.0 11.5 - 15.5 %   Platelets 257 150 - 400 K/uL   nRBC 0.0 0.0 - 0.2 %   Neutrophils Relative % 77 %   Neutro Abs 13.3 (H) 1.7 -  7.7 K/uL   Lymphocytes Relative 7 %   Lymphs Abs 1.2 0.7 - 4.0 K/uL   Monocytes Relative 7 %   Monocytes Absolute 1.2 (H) 0.1 - 1.0 K/uL   Eosinophils Relative 0 %   Eosinophils Absolute 0.0 0.0 - 0.5 K/uL   Basophils Relative 1 %   Basophils Absolute 0.1 0.0 - 0.1 K/uL   WBC Morphology  MORPHOLOGY UNREMARKABLE    RBC Morphology MORPHOLOGY UNREMARKABLE    Smear Review Normal platelet morphology    Immature Granulocytes 8 %   Abs Immature Granulocytes 1.40 (H) 0.00 - 0.07 K/uL  Basic metabolic panel  Result Value Ref Range   Sodium 129 (L) 135 - 145 mmol/L   Potassium 4.7 3.5 - 5.1 mmol/L   Chloride 94 (L) 98 - 111 mmol/L   CO2 23 22 - 32 mmol/L   Glucose, Bld 171 (H) 70 - 99 mg/dL   BUN 24 (H) 6 - 20 mg/dL   Creatinine, Ser 0.97 0.61 - 1.24 mg/dL   Calcium 8.8 (L) 8.9 - 10.3 mg/dL   GFR, Estimated >60 >60 mL/min   Anion gap 12 5 - 15  Magnesium  Result Value Ref Range   Magnesium 2.6 (H) 1.7 - 2.4 mg/dL  CBC with Differential/Platelet  Result Value Ref Range   WBC 17.6 (H) 4.0 - 10.5 K/uL   RBC 4.89 4.22 - 5.81 MIL/uL   Hemoglobin 13.7 13.0 - 17.0 g/dL   HCT 41.8 39.0 - 52.0 %   MCV 85.5 80.0 - 100.0 fL   MCH 28.0 26.0 - 34.0 pg   MCHC 32.8 30.0 - 36.0 g/dL   RDW 15.2 11.5 - 15.5 %   Platelets 225 150 - 400 K/uL   nRBC 0.1 0.0 - 0.2 %   Neutrophils Relative % 76 %   Neutro Abs 13.5 (H) 1.7 - 7.7 K/uL   Lymphocytes Relative 8 %   Lymphs Abs 1.4 0.7 - 4.0 K/uL   Monocytes Relative 6 %   Monocytes Absolute 1.0 0.1 - 1.0 K/uL   Eosinophils Relative 0 %   Eosinophils Absolute 0.0 0.0 - 0.5 K/uL   Basophils Relative 1 %   Basophils Absolute 0.1 0.0 - 0.1 K/uL   WBC Morphology MILD LEFT SHIFT (1-5% METAS, OCC MYELO, OCC BANDS)    RBC Morphology MORPHOLOGY UNREMARKABLE    Smear Review Normal platelet morphology    Immature Granulocytes 9 %   Abs Immature Granulocytes 1.60 (H) 0.00 - 0.07 K/uL  Basic metabolic panel  Result Value Ref Range   Sodium 131 (L) 135 - 145 mmol/L   Potassium 4.8 3.5 - 5.1 mmol/L   Chloride 97 (L) 98 - 111 mmol/L   CO2 23 22 - 32 mmol/L   Glucose, Bld 138 (H) 70 - 99 mg/dL   BUN 23 (H) 6 - 20 mg/dL   Creatinine, Ser 0.81 0.61 - 1.24 mg/dL   Calcium 8.5 (L) 8.9 - 10.3 mg/dL   GFR, Estimated >60 >60 mL/min   Anion gap 11 5 - 15   Magnesium  Result Value Ref Range   Magnesium 2.6 (H) 1.7 - 2.4 mg/dL  Hepatic function panel  Result Value Ref Range   Total Protein 5.6 (L) 6.5 - 8.1 g/dL   Albumin 2.9 (L) 3.5 - 5.0 g/dL   AST 125 (H) 15 - 41 U/L   ALT 549 (H) 0 - 44 U/L   Alkaline Phosphatase 71 38 - 126 U/L   Total Bilirubin 0.9 0.3 -  1.2 mg/dL   Bilirubin, Direct 0.2 0.0 - 0.2 mg/dL   Indirect Bilirubin 0.7 0.3 - 0.9 mg/dL  Comprehensive metabolic panel  Result Value Ref Range   Sodium 132 (L) 135 - 145 mmol/L   Potassium 4.7 3.5 - 5.1 mmol/L   Chloride 99 98 - 111 mmol/L   CO2 25 22 - 32 mmol/L   Glucose, Bld 118 (H) 70 - 99 mg/dL   BUN 21 (H) 6 - 20 mg/dL   Creatinine, Ser 0.85 0.61 - 1.24 mg/dL   Calcium 8.8 (L) 8.9 - 10.3 mg/dL   Total Protein 5.8 (L) 6.5 - 8.1 g/dL   Albumin 2.9 (L) 3.5 - 5.0 g/dL   AST 123 (H) 15 - 41 U/L   ALT 537 (H) 0 - 44 U/L   Alkaline Phosphatase 77 38 - 126 U/L   Total Bilirubin 0.9 0.3 - 1.2 mg/dL   GFR, Estimated >60 >60 mL/min   Anion gap 8 5 - 15  CBC with Differential/Platelet  Result Value Ref Range   WBC 17.0 (H) 4.0 - 10.5 K/uL   RBC 4.83 4.22 - 5.81 MIL/uL   Hemoglobin 13.7 13.0 - 17.0 g/dL   HCT 42.0 39.0 - 52.0 %   MCV 87.0 80.0 - 100.0 fL   MCH 28.4 26.0 - 34.0 pg   MCHC 32.6 30.0 - 36.0 g/dL   RDW 15.7 (H) 11.5 - 15.5 %   Platelets 201 150 - 400 K/uL   nRBC 0.4 (H) 0.0 - 0.2 %   Neutrophils Relative % 76 %   Neutro Abs 12.8 (H) 1.7 - 7.7 K/uL   Lymphocytes Relative 9 %   Lymphs Abs 1.6 0.7 - 4.0 K/uL   Monocytes Relative 5 %   Monocytes Absolute 0.9 0.1 - 1.0 K/uL   Eosinophils Relative 0 %   Eosinophils Absolute 0.0 0.0 - 0.5 K/uL   Basophils Relative 1 %   Basophils Absolute 0.1 0.0 - 0.1 K/uL   WBC Morphology MILD LEFT SHIFT (1-5% METAS, OCC MYELO, OCC BANDS)    RBC Morphology MORPHOLOGY UNREMARKABLE    Smear Review Normal platelet morphology    nRBC 0 0 /100 WBC   Immature Granulocytes 9 %   Abs Immature Granulocytes 1.57 (H) 0.00 - 0.07  K/uL  Magnesium  Result Value Ref Range   Magnesium 2.4 1.7 - 2.4 mg/dL  Phosphorus  Result Value Ref Range   Phosphorus 3.6 2.5 - 4.6 mg/dL  Troponin I (High Sensitivity)  Result Value Ref Range   Troponin I (High Sensitivity) 11 <18 ng/L      Assessment & Plan:   Problem List Items Addressed This Visit    RESOLVED: Pneumonia due to COVID-19 virus - Primary    Resolved. Hospitalized previously, has completed therapy Now completed outpatient therapy and oxygen supplement. 6 min walk test today passed, no indication for supplemental oxygen at this time  - See results. 98% at rest on room air, 94% walking 6 min on room air - Will DC oxygen order handwritten fax to [facility] once patient provides that info to our office to pick up O2 - Complete paperwork for short term disability he can return to work without restrictions on 08/21/20      Relevant Medications   albuterol (VENTOLIN HFA) 108 (90 Base) MCG/ACT inhaler   Fluticasone-Salmeterol (ADVAIR DISKUS) 250-50 MCG/DOSE AEPB   Mild persistent asthma   Relevant Medications   albuterol (VENTOLIN HFA) 108 (90 Base) MCG/ACT inhaler   Fluticasone-Salmeterol (  ADVAIR DISKUS) 250-50 MCG/DOSE AEPB   RESOLVED: Acute hypoxemic respiratory failure due to COVID-19 [facility])    Other Visit Diagnoses    Numbness and tingling in left hand       Relevant Orders   Ambulatory referral to Neurology       #Left hand Numbness Persistent problem, uncertain etiology, question if related to [place] / hospitalization Will refer to [facility] locally for further eval and nerve conduction testing  Orders Placed This Encounter  Procedures  . Ambulatory referral to Neurology    Referral Priority:   Routine    Referral Type:   Consultation    Referral Reason:   Specialty Services Required    Requested Specialty:   Neurology    Number of Visits Requested:   1    Meds ordered this encounter  Medications  . albuterol (VENTOLIN HFA) 108 (90 Base)  MCG/ACT inhaler    Sig: Inhale 2 puffs into the lungs every 4 (four) hours as needed for wheezing or shortness of breath.    Dispense:  8 g    Refill:  3  . Fluticasone-Salmeterol (ADVAIR DISKUS) 250-50 MCG/DOSE AEPB    Sig: Inhale 1 puff into the lungs 2 (two) times daily.    Dispense:  60 each    Refill:  5      Follow up plan: Return if symptoms worsen or fail to improve.    [name], [facility] 08/19/2020, 2:50 PM

## 2020-08-20 ENCOUNTER — Telehealth: Payer: Self-pay

## 2020-08-20 DIAGNOSIS — U071 COVID-19: Secondary | ICD-10-CM

## 2020-08-20 NOTE — Telephone Encounter (Signed)
Copied from [place] [id number]. Topic: General - Other >> Aug 20, 2020 10:11 AM Yvette Rack wrote: Reason for CRM: Pt stated he was told to call the office to provide the name of the company ([employer]) along with their contact# [phone] for oxygen to be picked up.

## 2020-08-20 NOTE — Telephone Encounter (Signed)
Handwritten rx pad for discontinue oxygen.  Ready for fax   [name], [facility] 08/20/2020, 1:30 PM

## 2020-08-20 NOTE — Telephone Encounter (Signed)
Rx faxed

## 2020-08-22 ENCOUNTER — Telehealth: Payer: Self-pay

## 2020-08-22 DIAGNOSIS — J4530 Mild persistent asthma, uncomplicated: Secondary | ICD-10-CM

## 2020-08-22 MED ORDER — FLUTICASONE-SALMETEROL 500-50 MCG/DOSE IN AEPB: 1.0000 | INHALATION_SPRAY | Freq: Every day | RESPIRATORY_TRACT | 5 refills | Status: DC

## 2020-08-22 NOTE — Telephone Encounter (Signed)
Unable to leave message VM is full if patient calls back let him to know the Rx is send.

## 2020-08-22 NOTE — Telephone Encounter (Signed)
Copied from [place] [id number]. Topic: General - Other >> Aug 22, 2020  9:19 AM Yvette Rack wrote: Reason for CRM: Pt stated his Rx was suppose to be for Fluticasone-Salmeterol (ADVAIR) 500-50 MCG/DOSE AEPB but he received Fluticasone-Salmeterol (ADVAIR DISKUS) 250-50 MCG/DOSE AEPB and he would like the Rx to corrected so he can receive the correct dosage of the medication. Pt requests call back

## 2020-08-22 NOTE — Telephone Encounter (Signed)
Please notify patient that I have corrected the dose. New rx 500mg  sent to [facility].  [name], [facility] 08/22/2020, 2:17 PM

## 2020-09-18 ENCOUNTER — Ambulatory Visit: Payer: Self-pay | Admitting: *Deleted

## 2020-09-18 NOTE — Telephone Encounter (Signed)
Yesterday- patient had cramping, nausea- that has diminished. Last night patient states she was doing well- then after eating he got dizzy-vision changes that passed. Patient reports he is still not feeling well slight dizziness today. Patient never got referral appointment.  Reason for Disposition . [1] MILD dizziness (e.g., walking normally) AND [2] has NOT been evaluated by physician for this  (Exception: dizziness caused by heat exposure, sudden standing, or poor fluid intake)  Answer Assessment - Initial Assessment Questions 1. DESCRIPTION: "Describe your dizziness."     Off balance 2. LIGHTHEADED: "Do you feel lightheaded?" (e.g., somewhat faint, woozy, weak upon standing)     Off balance- hard to walk 3. VERTIGO: "Do you feel like either you or the room is spinning or tilting?" (i.e. vertigo)     Not sure 4. SEVERITY: "How bad is it?"  "Do you feel like you are going to faint?" "Can you stand and walk?"   - MILD: Feels slightly dizzy, but walking normally.   - MODERATE: Feels very unsteady when walking, but not falling; interferes with normal activities (e.g., school, work) .   - SEVERE: Unable to walk without falling, or requires assistance to walk without falling; feels like passing out now.      mild 5. ONSET:  "When did the dizziness begin?"     yesterday 6. AGGRAVATING FACTORS: "Does anything make it worse?" (e.g., standing, change in head position)     Bending over 7. HEART RATE: "Can you tell me your heart rate?" "How many beats in 15 seconds?"  (Note: not all patients can do this)       No 8. 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 RECURRENT SYMPTOM: "Have you had dizziness before?" If Yes, ask: "When was the last time?" "What happened that time?"     Yes- Aspartame related 10. OTHER SYMPTOMS: "Do you have any other symptoms?" (e.g., fever, chest pain, vomiting, diarrhea, bleeding)       no 11. PREGNANCY: "Is  there any chance you are pregnant?" "When was your last menstrual period?"       n/a  Protocols used: DIZZINESS [facility]

## 2020-09-19 ENCOUNTER — Ambulatory Visit: Payer: Self-pay | Admitting: Family Medicine

## 2020-10-02 ENCOUNTER — Ambulatory Visit: Payer: Self-pay

## 2020-10-02 NOTE — Telephone Encounter (Signed)
Patient called and says since this morning he's been having abdominal cramping at a 8-9/10, then he goes to have a BM and the pain goes away. He says the stool is now really loose, not watery, brown no blood noted. He says his rectum is raw and irritated from the amount of times and his rectum is on fire when he has the BM, he notice blood when he wiped. He says he took imodium once and it slowed the bowels down. He reports not eating anything out of the ordinary, nothing bad tasting that he can recall. He says the pain is the lower abdomen up above the umbilicus and he feels a discomfort to his lower spine. He says at one time it felt like it was hard to urinate, but he's been urinating without any problems. Appointment scheduled for tomorrow, 10/03/20 at 0820 with Dr. Parks Ranger, care advice given, patient verbalized understanding.   Reason for Disposition . [1] MODERATE pain (e.g., interferes with normal activities) AND [2] pain comes and goes (cramps) AND [3] present > 24 hours  (Exception: pain with Vomiting or Diarrhea - see that Guideline)  Answer Assessment - Initial Assessment Questions 1. LOCATION: "Where does it hurt?"      Lower abdomen to above belly button 2. RADIATION: "Does the pain shoot anywhere else?" (e.g., chest, back)     Discomfort lower part of back 3. ONSET: "When did the pain begin?" (Minutes, hours or days ago)      This morning 4. SUDDEN: "Gradual or sudden onset?"     Sudden  5. PATTERN "Does the pain come and go, or is it constant?"    - If constant: "Is it getting better, staying the same, or worsening?"      (Note: Constant means the pain never goes away completely; most serious pain is constant and it progresses)     - If intermittent: "How long does it last?" "Do you have pain now?"     (Note: Intermittent means the pain goes away completely between bouts)     Comes and goes 6. SEVERITY: "How bad is the pain?"  (e.g., Scale 1-10; mild, moderate, or severe)    -  MILD (1-3): doesn't interfere with normal activities, abdomen soft and not tender to touch     - MODERATE (4-7): interferes with normal activities or awakens from sleep, tender to touch     - SEVERE (8-10): excruciating pain, doubled over, unable to do any normal activities       8-9 when having a BM 7. RECURRENT SYMPTOM: "Have you ever had this type of stomach pain before?" If Yes, ask: "When was the last time?" and "What happened that time?"      No 8. CAUSE: "What do you think is causing the stomach pain?"     I don't know 9. RELIEVING/AGGRAVATING FACTORS: "What makes it better or worse?" (e.g., movement, antacids, bowel movement)     Bowel movement makes it better 10. OTHER SYMPTOMS: "Has there been any vomiting, diarrhea, constipation, or urine problems?"       Loose stools, rectal irritation  Protocols used: ABDOMINAL PAIN - MALE-A-AH

## 2020-10-03 ENCOUNTER — Encounter: Payer: Self-pay | Admitting: Family Medicine

## 2020-10-03 ENCOUNTER — Ambulatory Visit (INDEPENDENT_AMBULATORY_CARE_PROVIDER_SITE_OTHER): Payer: BC Managed Care – PPO | Admitting: Family Medicine

## 2020-10-03 ENCOUNTER — Other Ambulatory Visit: Payer: Self-pay

## 2020-10-03 VITALS — BP 115/77 | HR 78 | Ht 68.0 in | Wt 241.0 lb

## 2020-10-03 DIAGNOSIS — R197 Diarrhea, unspecified: Secondary | ICD-10-CM | POA: Diagnosis not present

## 2020-10-03 DIAGNOSIS — R109 Unspecified abdominal pain: Secondary | ICD-10-CM | POA: Diagnosis not present

## 2020-10-03 MED ORDER — DICYCLOMINE HCL 10 MG PO CAPS: 10.0000 mg | ORAL_CAPSULE | Freq: Three times a day (TID) | ORAL | 2 refills | Status: DC

## 2020-10-03 NOTE — Patient Instructions (Addendum)
Thank you for coming to the office today.  Use imodium PRN if diarrhea persistent.  OTC Peppermint Oil (Triple Coated Capsule) 180mg  take one 3 times daily to reduce diarrhea  Other more natural remedies or preventative treatment: - Increase hydration with water - Increase fiber in diet (high fiber foods = vegetables, leafy greens, oats/grains) - May take OTC Fiber supplement (metamucil powder or pill/gummy) - May try OTC Probiotic  Future Colonoscopy 05/2021 - they can check for colitis as well.  Please schedule a Follow-up Appointment to: Return if symptoms worsen or fail to improve.  If you have any other questions or concerns, please feel free to call the office or send a message through [place]. You may also schedule an earlier appointment if necessary.  Additionally, you may be receiving a survey about your experience at our office within a few days to 1 week by e-mail or mail. We value your feedback.  [name], DO [place]

## 2020-10-03 NOTE — Progress Notes (Signed)
Subjective:    Patient ID: Derek Cook, male    DOB: 05-09-1961, 60 y.o.   MRN: [id number]  Derek Cook is a 60 y.o. male presenting on 10/03/2020 for Abdominal Pain and Diarrhea   HPI   Abdominal Pain / Diarrhea Reports symptoms acute onset yesterday with severe abdominal pain and burning with frequent loose stools, diarrhea, then pain improved, he improved PO intake later with rice and soup, questioning trigger of symptoms, he said he had salad recently day before and hadn't had that in a while. Now he took imodium and it improved significantly by end of day. Now no further significant BM. - Denies any dark stools, bloody stool    Depression screen [facility] 2/9 08/19/2020 08/06/2020 07/22/2020  Decreased Interest 0 0 0  Down, Depressed, Hopeless 0 0 0  PHQ - 2 Score 0 0 0    Social History   Tobacco Use  . Smoking status: Former Smoker    Quit date: 09/07/2009    Years since quitting: 11.0  . Smokeless tobacco: Former Network engineer  . Vaping Use: Never used  Substance Use Topics  . Alcohol use: No  . Drug use: No    Review of Systems Per HPI unless specifically indicated above     Objective:    BP 115/77   Pulse 78   Ht 5\' 8"  (1.727 m)   Wt 241 lb (109.3 kg)   SpO2 95%   BMI 36.64 kg/m   Wt Readings from Last 3 Encounters:  10/03/20 241 lb (109.3 kg)  08/19/20 231 lb 9.6 oz (105.1 kg)  08/06/20 229 lb 9.6 oz (104.1 kg)    Physical Exam Vitals and nursing note reviewed.  Constitutional:      General: He is not in acute distress.    Appearance: He is well-developed and well-nourished. He is not diaphoretic.     Comments: Well-appearing, comfortable, cooperative  HENT:     Head: Normocephalic and atraumatic.     Mouth/Throat:     Mouth: Oropharynx is clear and moist.  Eyes:     General:        Right eye: No discharge.        Left eye: No discharge.     Conjunctiva/sclera: Conjunctivae normal.  Cardiovascular:     Rate and Rhythm: Normal rate.   Pulmonary:     Effort: Pulmonary effort is normal.  Musculoskeletal:        General: No edema.  Skin:    General: Skin is warm and dry.     Findings: No erythema or rash.  Neurological:     Mental Status: He is alert and oriented to person, place, and time.  Psychiatric:        Mood and Affect: Mood and affect normal.        Behavior: Behavior normal.     Comments: Well groomed, good eye contact, normal speech and thoughts       Results for orders placed or performed during the hospital encounter of 06/25/20  CBC with Differential  Result Value Ref Range   WBC 7.5 4.0 - 10.5 K/uL   RBC 5.29 4.22 - 5.81 MIL/uL   Hemoglobin 14.7 13.0 - 17.0 g/dL   HCT 43.2 39.0 - 52.0 %   MCV 81.7 80.0 - 100.0 fL   MCH 27.8 26.0 - 34.0 pg   MCHC 34.0 30.0 - 36.0 g/dL   RDW 14.6 11.5 - 15.5 %   Platelets 242  150 - 400 K/uL   nRBC 0.0 0.0 - 0.2 %   Neutrophils Relative % 85 %   Neutro Abs 6.4 1.7 - 7.7 K/uL   Lymphocytes Relative 9 %   Lymphs Abs 0.7 0.7 - 4.0 K/uL   Monocytes Relative 6 %   Monocytes Absolute 0.4 0.1 - 1.0 K/uL   Eosinophils Relative 0 %   Eosinophils Absolute 0.0 0.0 - 0.5 K/uL   Basophils Relative 0 %   Basophils Absolute 0.0 0.0 - 0.1 K/uL   Immature Granulocytes 0 %   Abs Immature Granulocytes 0.02 0.00 - 0.07 K/uL  Comprehensive metabolic panel  Result Value Ref Range   Sodium 134 (L) 135 - 145 mmol/L   Potassium 4.2 3.5 - 5.1 mmol/L   Chloride 100 98 - 111 mmol/L   CO2 22 22 - 32 mmol/L   Glucose, Bld 120 (H) 70 - 99 mg/dL   BUN 20 6 - 20 mg/dL   Creatinine, Ser 0.93 0.61 - 1.24 mg/dL   Calcium 8.6 (L) 8.9 - 10.3 mg/dL   Total Protein 7.5 6.5 - 8.1 g/dL   Albumin 3.4 (L) 3.5 - 5.0 g/dL   AST 129 (H) 15 - 41 U/L   ALT 75 (H) 0 - 44 U/L   Alkaline Phosphatase 57 38 - 126 U/L   Total Bilirubin 1.1 0.3 - 1.2 mg/dL   GFR, Estimated >60 >60 mL/min   Anion gap 12 5 - 15  Lipase, blood  Result Value Ref Range   Lipase 29 11 - 51 U/L  Lactic acid, plasma   Result Value Ref Range   Lactic Acid, Venous 1.8 0.5 - 1.9 mmol/L  Urinalysis, Complete w Microscopic  Result Value Ref Range   Color, Urine YELLOW (A) YELLOW   APPearance CLEAR (A) CLEAR   Specific Gravity, Urine 1.019 1.005 - 1.030   pH 6.0 5.0 - 8.0   Glucose, UA NEGATIVE NEGATIVE mg/dL   Hgb urine dipstick NEGATIVE NEGATIVE   Bilirubin Urine NEGATIVE NEGATIVE   Ketones, ur 20 (A) NEGATIVE mg/dL   Protein, ur 30 (A) NEGATIVE mg/dL   Nitrite NEGATIVE NEGATIVE   Leukocytes,Ua NEGATIVE NEGATIVE   RBC / HPF 0-5 0 - 5 RBC/hpf   WBC, UA 0-5 0 - 5 WBC/hpf   Bacteria, UA NONE SEEN NONE SEEN   Squamous Epithelial / LPF NONE SEEN 0 - 5   Mucus PRESENT    Hyaline Casts, UA PRESENT   HIV Antibody (routine testing w rflx)  Result Value Ref Range   HIV Screen 4th Generation wRfx Non Reactive Non Reactive  Comprehensive metabolic panel  Result Value Ref Range   Sodium 138 135 - 145 mmol/L   Potassium 4.1 3.5 - 5.1 mmol/L   Chloride 103 98 - 111 mmol/L   CO2 22 22 - 32 mmol/L   Glucose, Bld 147 (H) 70 - 99 mg/dL   BUN 21 (H) 6 - 20 mg/dL   Creatinine, Ser 0.98 0.61 - 1.24 mg/dL   Calcium 8.7 (L) 8.9 - 10.3 mg/dL   Total Protein 6.7 6.5 - 8.1 g/dL   Albumin 3.2 (L) 3.5 - 5.0 g/dL   AST 105 (H) 15 - 41 U/L   ALT 70 (H) 0 - 44 U/L   Alkaline Phosphatase 60 38 - 126 U/L   Total Bilirubin 0.9 0.3 - 1.2 mg/dL   GFR, Estimated >60 >60 mL/min   Anion gap 13 5 - 15  CBC WITH DIFFERENTIAL  Result Value Ref  Range   WBC 3.8 (L) 4.0 - 10.5 K/uL   RBC 5.06 4.22 - 5.81 MIL/uL   Hemoglobin 14.2 13.0 - 17.0 g/dL   HCT 42.6 39.0 - 52.0 %   MCV 84.2 80.0 - 100.0 fL   MCH 28.1 26.0 - 34.0 pg   MCHC 33.3 30.0 - 36.0 g/dL   RDW 14.7 11.5 - 15.5 %   Platelets 222 150 - 400 K/uL   nRBC 0.0 0.0 - 0.2 %   Neutrophils Relative % 77 %   Neutro Abs 3.0 1.7 - 7.7 K/uL   Lymphocytes Relative 15 %   Lymphs Abs 0.6 (L) 0.7 - 4.0 K/uL   Monocytes Relative 7 %   Monocytes Absolute 0.3 0.1 - 1.0 K/uL    Eosinophils Relative 0 %   Eosinophils Absolute 0.0 0.0 - 0.5 K/uL   Basophils Relative 0 %   Basophils Absolute 0.0 0.0 - 0.1 K/uL   Immature Granulocytes 1 %   Abs Immature Granulocytes 0.02 0.00 - 0.07 K/uL  Magnesium  Result Value Ref Range   Magnesium 2.4 1.7 - 2.4 mg/dL  Phosphorus  Result Value Ref Range   Phosphorus 4.2 2.5 - 4.6 mg/dL  Blood gas, arterial  Result Value Ref Range   FIO2 0.40    Delivery systems NASAL CANNULA    pH, Arterial 7.42 7.350 - 7.450   pCO2 arterial 33 32.0 - 48.0 mmHg   pO2, Arterial 63 (L) 83.0 - 108.0 mmHg   Bicarbonate 21.4 20.0 - 28.0 mmol/L   Acid-base deficit 2.2 (H) 0.0 - 2.0 mmol/L   O2 Saturation 92.2 %   Patient temperature 37.0    Collection site RIGHT RADIAL    Sample type ARTERIAL DRAW    Allens test (pass/fail) PASS PASS  C-reactive protein  Result Value Ref Range   CRP 17.3 (H) <1.0 mg/dL  Ferritin  Result Value Ref Range   Ferritin 1,626 (H) 24 - 336 ng/mL  Fibrinogen  Result Value Ref Range   Fibrinogen 726 (H) 210 - 475 mg/dL  Lactate dehydrogenase  Result Value Ref Range   LDH 459 (H) 98 - 192 U/L  Magnesium  Result Value Ref Range   Magnesium 2.5 (H) 1.7 - 2.4 mg/dL  Procalcitonin - Baseline  Result Value Ref Range   Procalcitonin 0.63 ng/mL  C-reactive protein  Result Value Ref Range   CRP 8.4 (H) <1.0 mg/dL  Procalcitonin  Result Value Ref Range   Procalcitonin 0.51 ng/mL  CBC  Result Value Ref Range   WBC 8.4 4.0 - 10.5 K/uL   RBC 4.87 4.22 - 5.81 MIL/uL   Hemoglobin 13.9 13.0 - 17.0 g/dL   HCT 41.3 39.0 - 52.0 %   MCV 84.8 80.0 - 100.0 fL   MCH 28.5 26.0 - 34.0 pg   MCHC 33.7 30.0 - 36.0 g/dL   RDW 14.5 11.5 - 15.5 %   Platelets 299 150 - 400 K/uL   nRBC 0.0 0.0 - 0.2 %  Comprehensive metabolic panel  Result Value Ref Range   Sodium 138 135 - 145 mmol/L   Potassium 4.5 3.5 - 5.1 mmol/L   Chloride 105 98 - 111 mmol/L   CO2 20 (L) 22 - 32 mmol/L   Glucose, Bld 134 (H) 70 - 99 mg/dL   BUN 21  (H) 6 - 20 mg/dL   Creatinine, Ser 0.74 0.61 - 1.24 mg/dL   Calcium 8.3 (L) 8.9 - 10.3 mg/dL   Total Protein 6.5 6.5 -  8.1 g/dL   Albumin 3.0 (L) 3.5 - 5.0 g/dL   AST 75 (H) 15 - 41 U/L   ALT 72 (H) 0 - 44 U/L   Alkaline Phosphatase 63 38 - 126 U/L   Total Bilirubin 0.9 0.3 - 1.2 mg/dL   GFR, Estimated >60 >60 mL/min   Anion gap 13 5 - 15  Magnesium  Result Value Ref Range   Magnesium 2.6 (H) 1.7 - 2.4 mg/dL  Fibrin derivatives D-Dimer ([facility] only)  Result Value Ref Range   Fibrin derivatives D-dimer ([facility]) 3,157.14 (H) 0.00 - 499.00 ng/mL (FEU)  Glucose, capillary  Result Value Ref Range   Glucose-Capillary 128 (H) 70 - 99 mg/dL  C-reactive protein  Result Value Ref Range   CRP 3.2 (H) <1.0 mg/dL  Procalcitonin  Result Value Ref Range   Procalcitonin 0.25 ng/mL  Comprehensive metabolic panel  Result Value Ref Range   Sodium 136 135 - 145 mmol/L   Potassium 4.4 3.5 - 5.1 mmol/L   Chloride 104 98 - 111 mmol/L   CO2 22 22 - 32 mmol/L   Glucose, Bld 135 (H) 70 - 99 mg/dL   BUN 25 (H) 6 - 20 mg/dL   Creatinine, Ser 0.80 0.61 - 1.24 mg/dL   Calcium 8.5 (L) 8.9 - 10.3 mg/dL   Total Protein 6.4 (L) 6.5 - 8.1 g/dL   Albumin 3.0 (L) 3.5 - 5.0 g/dL   AST 46 (H) 15 - 41 U/L   ALT 69 (H) 0 - 44 U/L   Alkaline Phosphatase 64 38 - 126 U/L   Total Bilirubin 1.0 0.3 - 1.2 mg/dL   GFR, Estimated >60 >60 mL/min   Anion gap 10 5 - 15  Fibrin derivatives D-Dimer ([facility] only)  Result Value Ref Range   Fibrin derivatives D-dimer ([facility]) >7,500.00 (H) 0.00 - 499.00 ng/mL (FEU)  C-reactive protein  Result Value Ref Range   CRP 1.3 (H) <1.0 mg/dL  Comprehensive metabolic panel  Result Value Ref Range   Sodium 134 (L) 135 - 145 mmol/L   Potassium 4.6 3.5 - 5.1 mmol/L   Chloride 103 98 - 111 mmol/L   CO2 23 22 - 32 mmol/L   Glucose, Bld 160 (H) 70 - 99 mg/dL   BUN 26 (H) 6 - 20 mg/dL   Creatinine, Ser 0.94 0.61 - 1.24 mg/dL   Calcium 8.3 (L) 8.9 - 10.3 mg/dL   Total Protein 6.1 (L) 6.5  - 8.1 g/dL   Albumin 3.0 (L) 3.5 - 5.0 g/dL   AST 42 (H) 15 - 41 U/L   ALT 69 (H) 0 - 44 U/L   Alkaline Phosphatase 57 38 - 126 U/L   Total Bilirubin 0.9 0.3 - 1.2 mg/dL   GFR, Estimated >60 >60 mL/min   Anion gap 8 5 - 15  Fibrin derivatives D-Dimer ([facility] only)  Result Value Ref Range   Fibrin derivatives D-dimer ([facility]) 5,539.67 (H) 0.00 - 499.00 ng/mL (FEU)  C-reactive protein  Result Value Ref Range   CRP 0.6 <1.0 mg/dL  Comprehensive metabolic panel  Result Value Ref Range   Sodium 134 (L) 135 - 145 mmol/L   Potassium 4.7 3.5 - 5.1 mmol/L   Chloride 103 98 - 111 mmol/L   CO2 24 22 - 32 mmol/L   Glucose, Bld 148 (H) 70 - 99 mg/dL   BUN 25 (H) 6 - 20 mg/dL   Creatinine, Ser 0.88 0.61 - 1.24 mg/dL   Calcium 8.4 (L) 8.9 - 10.3 mg/dL  Total Protein 6.1 (L) 6.5 - 8.1 g/dL   Albumin 3.1 (L) 3.5 - 5.0 g/dL   AST 43 (H) 15 - 41 U/L   ALT 81 (H) 0 - 44 U/L   Alkaline Phosphatase 55 38 - 126 U/L   Total Bilirubin 1.0 0.3 - 1.2 mg/dL   GFR, Estimated >60 >60 mL/min   Anion gap 7 5 - 15  Fibrin derivatives D-Dimer ([facility] only)  Result Value Ref Range   Fibrin derivatives D-dimer ([facility]) 3,263.67 (H) 0.00 - 499.00 ng/mL (FEU)  C-reactive protein  Result Value Ref Range   CRP 0.5 <1.0 mg/dL  Comprehensive metabolic panel  Result Value Ref Range   Sodium 132 (L) 135 - 145 mmol/L   Potassium 4.4 3.5 - 5.1 mmol/L   Chloride 100 98 - 111 mmol/L   CO2 23 22 - 32 mmol/L   Glucose, Bld 126 (H) 70 - 99 mg/dL   BUN 26 (H) 6 - 20 mg/dL   Creatinine, Ser 1.07 0.61 - 1.24 mg/dL   Calcium 8.8 (L) 8.9 - 10.3 mg/dL   Total Protein 6.4 (L) 6.5 - 8.1 g/dL   Albumin 3.2 (L) 3.5 - 5.0 g/dL   AST 37 15 - 41 U/L   ALT 85 (H) 0 - 44 U/L   Alkaline Phosphatase 55 38 - 126 U/L   Total Bilirubin 1.0 0.3 - 1.2 mg/dL   GFR, Estimated >60 >60 mL/min   Anion gap 9 5 - 15  Fibrin derivatives D-Dimer ([facility] only)  Result Value Ref Range   Fibrin derivatives D-dimer ([facility]) 2,335.87 (H) 0.00 - 499.00  ng/mL (FEU)  C-reactive protein  Result Value Ref Range   CRP 1.7 (H) <1.0 mg/dL  Comprehensive metabolic panel  Result Value Ref Range   Sodium 133 (L) 135 - 145 mmol/L   Potassium 4.5 3.5 - 5.1 mmol/L   Chloride 103 98 - 111 mmol/L   CO2 21 (L) 22 - 32 mmol/L   Glucose, Bld 143 (H) 70 - 99 mg/dL   BUN 24 (H) 6 - 20 mg/dL   Creatinine, Ser 0.90 0.61 - 1.24 mg/dL   Calcium 8.5 (L) 8.9 - 10.3 mg/dL   Total Protein 6.2 (L) 6.5 - 8.1 g/dL   Albumin 3.1 (L) 3.5 - 5.0 g/dL   AST 41 15 - 41 U/L   ALT 92 (H) 0 - 44 U/L   Alkaline Phosphatase 62 38 - 126 U/L   Total Bilirubin 1.0 0.3 - 1.2 mg/dL   GFR, Estimated >60 >60 mL/min   Anion gap 9 5 - 15  Fibrin derivatives D-Dimer ([facility] only)  Result Value Ref Range   Fibrin derivatives D-dimer ([facility]) 1,997.30 (H) 0.00 - 499.00 ng/mL (FEU)  CBC  Result Value Ref Range   WBC 14.3 (H) 4.0 - 10.5 K/uL   RBC 5.29 4.22 - 5.81 MIL/uL   Hemoglobin 14.9 13.0 - 17.0 g/dL   HCT 44.3 39.0 - 52.0 %   MCV 83.7 80.0 - 100.0 fL   MCH 28.2 26.0 - 34.0 pg   MCHC 33.6 30.0 - 36.0 g/dL   RDW 14.2 11.5 - 15.5 %   Platelets 590 (H) 150 - 400 K/uL   nRBC 0.0 0.0 - 0.2 %  C-reactive protein  Result Value Ref Range   CRP 0.5 <1.0 mg/dL  Comprehensive metabolic panel  Result Value Ref Range   Sodium 132 (L) 135 - 145 mmol/L   Potassium 4.4 3.5 - 5.1 mmol/L   Chloride 101  98 - 111 mmol/L   CO2 23 22 - 32 mmol/L   Glucose, Bld 114 (H) 70 - 99 mg/dL   BUN 22 (H) 6 - 20 mg/dL   Creatinine, Ser 0.74 0.61 - 1.24 mg/dL   Calcium 8.5 (L) 8.9 - 10.3 mg/dL   Total Protein 6.1 (L) 6.5 - 8.1 g/dL   Albumin 3.1 (L) 3.5 - 5.0 g/dL   AST 34 15 - 41 U/L   ALT 90 (H) 0 - 44 U/L   Alkaline Phosphatase 50 38 - 126 U/L   Total Bilirubin 0.8 0.3 - 1.2 mg/dL   GFR, Estimated >60 >60 mL/min   Anion gap 8 5 - 15  Fibrin derivatives D-Dimer ([facility] only)  Result Value Ref Range   Fibrin derivatives D-dimer ([facility]) 1,462.84 (H) 0.00 - 499.00 ng/mL (FEU)  Comprehensive  metabolic panel  Result Value Ref Range   Sodium 131 (L) 135 - 145 mmol/L   Potassium 4.6 3.5 - 5.1 mmol/L   Chloride 98 98 - 111 mmol/L   CO2 23 22 - 32 mmol/L   Glucose, Bld 134 (H) 70 - 99 mg/dL   BUN 27 (H) 6 - 20 mg/dL   Creatinine, Ser 0.95 0.61 - 1.24 mg/dL   Calcium 8.5 (L) 8.9 - 10.3 mg/dL   Total Protein 6.3 (L) 6.5 - 8.1 g/dL   Albumin 3.2 (L) 3.5 - 5.0 g/dL   AST 43 (H) 15 - 41 U/L   ALT 98 (H) 0 - 44 U/L   Alkaline Phosphatase 58 38 - 126 U/L   Total Bilirubin 1.1 0.3 - 1.2 mg/dL   GFR, Estimated >60 >60 mL/min   Anion gap 10 5 - 15  Fibrin derivatives D-Dimer ([facility] only)  Result Value Ref Range   Fibrin derivatives D-dimer ([facility]) 1,263.46 (H) 0.00 - 499.00 ng/mL (FEU)  Comprehensive metabolic panel  Result Value Ref Range   Sodium 131 (L) 135 - 145 mmol/L   Potassium 4.9 3.5 - 5.1 mmol/L   Chloride 98 98 - 111 mmol/L   CO2 24 22 - 32 mmol/L   Glucose, Bld 143 (H) 70 - 99 mg/dL   BUN 26 (H) 6 - 20 mg/dL   Creatinine, Ser 0.98 0.61 - 1.24 mg/dL   Calcium 8.8 (L) 8.9 - 10.3 mg/dL   Total Protein 6.4 (L) 6.5 - 8.1 g/dL   Albumin 3.3 (L) 3.5 - 5.0 g/dL   AST 42 (H) 15 - 41 U/L   ALT 106 (H) 0 - 44 U/L   Alkaline Phosphatase 61 38 - 126 U/L   Total Bilirubin 1.3 (H) 0.3 - 1.2 mg/dL   GFR, Estimated >60 >60 mL/min   Anion gap 9 5 - 15  Fibrin derivatives D-Dimer ([facility] only)  Result Value Ref Range   Fibrin derivatives D-dimer ([facility]) 1,085.87 (H) 0.00 - 499.00 ng/mL (FEU)  CBC  Result Value Ref Range   WBC 18.4 (H) 4.0 - 10.5 K/uL   RBC 5.31 4.22 - 5.81 MIL/uL   Hemoglobin 15.2 13.0 - 17.0 g/dL   HCT 44.5 39.0 - 52.0 %   MCV 83.8 80.0 - 100.0 fL   MCH 28.6 26.0 - 34.0 pg   MCHC 34.2 30.0 - 36.0 g/dL   RDW 14.4 11.5 - 15.5 %   Platelets 557 (H) 150 - 400 K/uL   nRBC 0.1 0.0 - 0.2 %  Comprehensive metabolic panel  Result Value Ref Range   Sodium 130 (L) 135 - 145 mmol/L   Potassium  4.8 3.5 - 5.1 mmol/L   Chloride 94 (L) 98 - 111 mmol/L   CO2 26 22 - 32  mmol/L   Glucose, Bld 137 (H) 70 - 99 mg/dL   BUN 29 (H) 6 - 20 mg/dL   Creatinine, Ser 0.93 0.61 - 1.24 mg/dL   Calcium 8.9 8.9 - 10.3 mg/dL   Total Protein 6.1 (L) 6.5 - 8.1 g/dL   Albumin 3.2 (L) 3.5 - 5.0 g/dL   AST 54 (H) 15 - 41 U/L   ALT 143 (H) 0 - 44 U/L   Alkaline Phosphatase 61 38 - 126 U/L   Total Bilirubin 1.0 0.3 - 1.2 mg/dL   GFR, Estimated >60 >60 mL/min   Anion gap 10 5 - 15  Magnesium  Result Value Ref Range   Magnesium 2.6 (H) 1.7 - 2.4 mg/dL  Comprehensive metabolic panel  Result Value Ref Range   Sodium 133 (L) 135 - 145 mmol/L   Potassium 4.4 3.5 - 5.1 mmol/L   Chloride 96 (L) 98 - 111 mmol/L   CO2 25 22 - 32 mmol/L   Glucose, Bld 145 (H) 70 - 99 mg/dL   BUN 33 (H) 6 - 20 mg/dL   Creatinine, Ser 1.09 0.61 - 1.24 mg/dL   Calcium 8.5 (L) 8.9 - 10.3 mg/dL   Total Protein 5.8 (L) 6.5 - 8.1 g/dL   Albumin 3.1 (L) 3.5 - 5.0 g/dL   AST 73 (H) 15 - 41 U/L   ALT 179 (H) 0 - 44 U/L   Alkaline Phosphatase 63 38 - 126 U/L   Total Bilirubin 1.1 0.3 - 1.2 mg/dL   GFR, Estimated >60 >60 mL/min   Anion gap 12 5 - 15  CBC  Result Value Ref Range   WBC 18.4 (H) 4.0 - 10.5 K/uL   RBC 5.18 4.22 - 5.81 MIL/uL   Hemoglobin 14.8 13.0 - 17.0 g/dL   HCT 43.8 39.0 - 52.0 %   MCV 84.6 80.0 - 100.0 fL   MCH 28.6 26.0 - 34.0 pg   MCHC 33.8 30.0 - 36.0 g/dL   RDW 14.5 11.5 - 15.5 %   Platelets 481 (H) 150 - 400 K/uL   nRBC 0.0 0.0 - 0.2 %  Comprehensive metabolic panel  Result Value Ref Range   Sodium 132 (L) 135 - 145 mmol/L   Potassium 4.6 3.5 - 5.1 mmol/L   Chloride 93 (L) 98 - 111 mmol/L   CO2 27 22 - 32 mmol/L   Glucose, Bld 132 (H) 70 - 99 mg/dL   BUN 33 (H) 6 - 20 mg/dL   Creatinine, Ser 1.01 0.61 - 1.24 mg/dL   Calcium 8.9 8.9 - 10.3 mg/dL   Total Protein 6.1 (L) 6.5 - 8.1 g/dL   Albumin 3.2 (L) 3.5 - 5.0 g/dL   AST 108 (H) 15 - 41 U/L   ALT 281 (H) 0 - 44 U/L   Alkaline Phosphatase 62 38 - 126 U/L   Total Bilirubin 1.2 0.3 - 1.2 mg/dL   GFR, Estimated  >60 >60 mL/min   Anion gap 12 5 - 15  Comprehensive metabolic panel  Result Value Ref Range   Sodium 130 (L) 135 - 145 mmol/L   Potassium 4.6 3.5 - 5.1 mmol/L   Chloride 91 (L) 98 - 111 mmol/L   CO2 27 22 - 32 mmol/L   Glucose, Bld 117 (H) 70 - 99 mg/dL   BUN 31 (H) 6 - 20 mg/dL   Creatinine, Ser  0.94 0.61 - 1.24 mg/dL   Calcium 8.4 (L) 8.9 - 10.3 mg/dL   Total Protein 5.9 (L) 6.5 - 8.1 g/dL   Albumin 3.2 (L) 3.5 - 5.0 g/dL   AST 188 (H) 15 - 41 U/L   ALT 495 (H) 0 - 44 U/L   Alkaline Phosphatase 72 38 - 126 U/L   Total Bilirubin 1.2 0.3 - 1.2 mg/dL   GFR, Estimated >60 >60 mL/min   Anion gap 12 5 - 15  CBC with Differential/Platelet  Result Value Ref Range   WBC 17.7 (H) 4.0 - 10.5 K/uL   RBC 5.03 4.22 - 5.81 MIL/uL   Hemoglobin 14.5 13.0 - 17.0 g/dL   HCT 43.0 39.0 - 52.0 %   MCV 85.5 80.0 - 100.0 fL   MCH 28.8 26.0 - 34.0 pg   MCHC 33.7 30.0 - 36.0 g/dL   RDW 14.8 11.5 - 15.5 %   Platelets 351 150 - 400 K/uL   nRBC 0.0 0.0 - 0.2 %   Neutrophils Relative % 79 %   Neutro Abs 14.0 (H) 1.7 - 7.7 K/uL   Lymphocytes Relative 6 %   Lymphs Abs 1.0 0.7 - 4.0 K/uL   Monocytes Relative 10 %   Monocytes Absolute 1.8 (H) 0.1 - 1.0 K/uL   Eosinophils Relative 0 %   Eosinophils Absolute 0.0 0.0 - 0.5 K/uL   Basophils Relative 0 %   Basophils Absolute 0.0 0.0 - 0.1 K/uL   Immature Granulocytes 5 %   Abs Immature Granulocytes 0.83 (H) 0.00 - 0.07 K/uL  Comprehensive metabolic panel  Result Value Ref Range   Sodium 133 (L) 135 - 145 mmol/L   Potassium 4.6 3.5 - 5.1 mmol/L   Chloride 91 (L) 98 - 111 mmol/L   CO2 26 22 - 32 mmol/L   Glucose, Bld 160 (H) 70 - 99 mg/dL   BUN 35 (H) 6 - 20 mg/dL   Creatinine, Ser 1.00 0.61 - 1.24 mg/dL   Calcium 9.1 8.9 - 10.3 mg/dL   Total Protein 5.9 (L) 6.5 - 8.1 g/dL   Albumin 3.1 (L) 3.5 - 5.0 g/dL   AST 153 (H) 15 - 41 U/L   ALT 496 (H) 0 - 44 U/L   Alkaline Phosphatase 77 38 - 126 U/L   Total Bilirubin 1.4 (H) 0.3 - 1.2 mg/dL   GFR,  Estimated >60 >60 mL/min   Anion gap 16 (H) 5 - 15  Hepatitis panel, acute  Result Value Ref Range   Hepatitis B Surface Ag NON REACTIVE NON REACTIVE   HCV Ab NON REACTIVE NON REACTIVE   Hep A IgM NON REACTIVE NON REACTIVE   Hep B C IgM NON REACTIVE NON REACTIVE  Procalcitonin - Baseline  Result Value Ref Range   Procalcitonin 0.30 ng/mL  Brain natriuretic peptide  Result Value Ref Range   B Natriuretic Peptide 82.3 0.0 - 100.0 pg/mL  Basic metabolic panel  Result Value Ref Range   Sodium 131 (L) 135 - 145 mmol/L   Potassium 5.1 3.5 - 5.1 mmol/L   Chloride 90 (L) 98 - 111 mmol/L   CO2 27 22 - 32 mmol/L   Glucose, Bld 124 (H) 70 - 99 mg/dL   BUN 30 (H) 6 - 20 mg/dL   Creatinine, Ser 1.00 0.61 - 1.24 mg/dL   Calcium 8.6 (L) 8.9 - 10.3 mg/dL   GFR, Estimated >60 >60 mL/min   Anion gap 14 5 - 15  Magnesium  Result Value  Ref Range   Magnesium 2.7 (H) 1.7 - 2.4 mg/dL  CBC with Differential/Platelet  Result Value Ref Range   WBC 18.9 (H) 4.0 - 10.5 K/uL   RBC 5.03 4.22 - 5.81 MIL/uL   Hemoglobin 14.4 13.0 - 17.0 g/dL   HCT 43.1 39.0 - 52.0 %   MCV 85.7 80.0 - 100.0 fL   MCH 28.6 26.0 - 34.0 pg   MCHC 33.4 30.0 - 36.0 g/dL   RDW 15.1 11.5 - 15.5 %   Platelets 314 150 - 400 K/uL   nRBC 0.0 0.0 - 0.2 %   Neutrophils Relative % 76 %   Neutro Abs 14.5 (H) 1.7 - 7.7 K/uL   Lymphocytes Relative 8 %   Lymphs Abs 1.5 0.7 - 4.0 K/uL   Monocytes Relative 9 %   Monocytes Absolute 1.6 (H) 0.1 - 1.0 K/uL   Eosinophils Relative 0 %   Eosinophils Absolute 0.0 0.0 - 0.5 K/uL   Basophils Relative 1 %   Basophils Absolute 0.1 0.0 - 0.1 K/uL   WBC Morphology MORPHOLOGY UNREMARKABLE    RBC Morphology MORPHOLOGY UNREMARKABLE    Smear Review Normal platelet morphology    Immature Granulocytes 6 %   Abs Immature Granulocytes 1.15 (H) 0.00 - 0.07 K/uL  Hepatic function panel  Result Value Ref Range   Total Protein 5.8 (L) 6.5 - 8.1 g/dL   Albumin 2.9 (L) 3.5 - 5.0 g/dL   AST 163 (H) 15 -  41 U/L   ALT 547 (H) 0 - 44 U/L   Alkaline Phosphatase 71 38 - 126 U/L   Total Bilirubin 1.4 (H) 0.3 - 1.2 mg/dL   Bilirubin, Direct 0.3 (H) 0.0 - 0.2 mg/dL   Indirect Bilirubin 1.1 (H) 0.3 - 0.9 mg/dL  CBC with Differential/Platelet  Result Value Ref Range   WBC 17.2 (H) 4.0 - 10.5 K/uL   RBC 4.75 4.22 - 5.81 MIL/uL   Hemoglobin 13.4 13.0 - 17.0 g/dL   HCT 41.4 39.0 - 52.0 %   MCV 87.2 80.0 - 100.0 fL   MCH 28.2 26.0 - 34.0 pg   MCHC 32.4 30.0 - 36.0 g/dL   RDW 15.0 11.5 - 15.5 %   Platelets 257 150 - 400 K/uL   nRBC 0.0 0.0 - 0.2 %   Neutrophils Relative % 77 %   Neutro Abs 13.3 (H) 1.7 - 7.7 K/uL   Lymphocytes Relative 7 %   Lymphs Abs 1.2 0.7 - 4.0 K/uL   Monocytes Relative 7 %   Monocytes Absolute 1.2 (H) 0.1 - 1.0 K/uL   Eosinophils Relative 0 %   Eosinophils Absolute 0.0 0.0 - 0.5 K/uL   Basophils Relative 1 %   Basophils Absolute 0.1 0.0 - 0.1 K/uL   WBC Morphology MORPHOLOGY UNREMARKABLE    RBC Morphology MORPHOLOGY UNREMARKABLE    Smear Review Normal platelet morphology    Immature Granulocytes 8 %   Abs Immature Granulocytes 1.40 (H) 0.00 - 0.07 K/uL  Basic metabolic panel  Result Value Ref Range   Sodium 129 (L) 135 - 145 mmol/L   Potassium 4.7 3.5 - 5.1 mmol/L   Chloride 94 (L) 98 - 111 mmol/L   CO2 23 22 - 32 mmol/L   Glucose, Bld 171 (H) 70 - 99 mg/dL   BUN 24 (H) 6 - 20 mg/dL   Creatinine, Ser 0.97 0.61 - 1.24 mg/dL   Calcium 8.8 (L) 8.9 - 10.3 mg/dL   GFR, Estimated >60 >60 mL/min  Anion gap 12 5 - 15  Magnesium  Result Value Ref Range   Magnesium 2.6 (H) 1.7 - 2.4 mg/dL  CBC with Differential/Platelet  Result Value Ref Range   WBC 17.6 (H) 4.0 - 10.5 K/uL   RBC 4.89 4.22 - 5.81 MIL/uL   Hemoglobin 13.7 13.0 - 17.0 g/dL   HCT 41.8 39.0 - 52.0 %   MCV 85.5 80.0 - 100.0 fL   MCH 28.0 26.0 - 34.0 pg   MCHC 32.8 30.0 - 36.0 g/dL   RDW 15.2 11.5 - 15.5 %   Platelets 225 150 - 400 K/uL   nRBC 0.1 0.0 - 0.2 %   Neutrophils Relative % 76 %    Neutro Abs 13.5 (H) 1.7 - 7.7 K/uL   Lymphocytes Relative 8 %   Lymphs Abs 1.4 0.7 - 4.0 K/uL   Monocytes Relative 6 %   Monocytes Absolute 1.0 0.1 - 1.0 K/uL   Eosinophils Relative 0 %   Eosinophils Absolute 0.0 0.0 - 0.5 K/uL   Basophils Relative 1 %   Basophils Absolute 0.1 0.0 - 0.1 K/uL   WBC Morphology MILD LEFT SHIFT (1-5% METAS, OCC MYELO, OCC BANDS)    RBC Morphology MORPHOLOGY UNREMARKABLE    Smear Review Normal platelet morphology    Immature Granulocytes 9 %   Abs Immature Granulocytes 1.60 (H) 0.00 - 0.07 K/uL  Basic metabolic panel  Result Value Ref Range   Sodium 131 (L) 135 - 145 mmol/L   Potassium 4.8 3.5 - 5.1 mmol/L   Chloride 97 (L) 98 - 111 mmol/L   CO2 23 22 - 32 mmol/L   Glucose, Bld 138 (H) 70 - 99 mg/dL   BUN 23 (H) 6 - 20 mg/dL   Creatinine, Ser 0.81 0.61 - 1.24 mg/dL   Calcium 8.5 (L) 8.9 - 10.3 mg/dL   GFR, Estimated >60 >60 mL/min   Anion gap 11 5 - 15  Magnesium  Result Value Ref Range   Magnesium 2.6 (H) 1.7 - 2.4 mg/dL  Hepatic function panel  Result Value Ref Range   Total Protein 5.6 (L) 6.5 - 8.1 g/dL   Albumin 2.9 (L) 3.5 - 5.0 g/dL   AST 125 (H) 15 - 41 U/L   ALT 549 (H) 0 - 44 U/L   Alkaline Phosphatase 71 38 - 126 U/L   Total Bilirubin 0.9 0.3 - 1.2 mg/dL   Bilirubin, Direct 0.2 0.0 - 0.2 mg/dL   Indirect Bilirubin 0.7 0.3 - 0.9 mg/dL  Comprehensive metabolic panel  Result Value Ref Range   Sodium 132 (L) 135 - 145 mmol/L   Potassium 4.7 3.5 - 5.1 mmol/L   Chloride 99 98 - 111 mmol/L   CO2 25 22 - 32 mmol/L   Glucose, Bld 118 (H) 70 - 99 mg/dL   BUN 21 (H) 6 - 20 mg/dL   Creatinine, Ser 0.85 0.61 - 1.24 mg/dL   Calcium 8.8 (L) 8.9 - 10.3 mg/dL   Total Protein 5.8 (L) 6.5 - 8.1 g/dL   Albumin 2.9 (L) 3.5 - 5.0 g/dL   AST 123 (H) 15 - 41 U/L   ALT 537 (H) 0 - 44 U/L   Alkaline Phosphatase 77 38 - 126 U/L   Total Bilirubin 0.9 0.3 - 1.2 mg/dL   GFR, Estimated >60 >60 mL/min   Anion gap 8 5 - 15  CBC with  Differential/Platelet  Result Value Ref Range   WBC 17.0 (H) 4.0 - 10.5 K/uL   RBC  4.83 4.22 - 5.81 MIL/uL   Hemoglobin 13.7 13.0 - 17.0 g/dL   HCT 42.0 39.0 - 52.0 %   MCV 87.0 80.0 - 100.0 fL   MCH 28.4 26.0 - 34.0 pg   MCHC 32.6 30.0 - 36.0 g/dL   RDW 15.7 (H) 11.5 - 15.5 %   Platelets 201 150 - 400 K/uL   nRBC 0.4 (H) 0.0 - 0.2 %   Neutrophils Relative % 76 %   Neutro Abs 12.8 (H) 1.7 - 7.7 K/uL   Lymphocytes Relative 9 %   Lymphs Abs 1.6 0.7 - 4.0 K/uL   Monocytes Relative 5 %   Monocytes Absolute 0.9 0.1 - 1.0 K/uL   Eosinophils Relative 0 %   Eosinophils Absolute 0.0 0.0 - 0.5 K/uL   Basophils Relative 1 %   Basophils Absolute 0.1 0.0 - 0.1 K/uL   WBC Morphology MILD LEFT SHIFT (1-5% METAS, OCC MYELO, OCC BANDS)    RBC Morphology MORPHOLOGY UNREMARKABLE    Smear Review Normal platelet morphology    nRBC 0 0 /100 WBC   Immature Granulocytes 9 %   Abs Immature Granulocytes 1.57 (H) 0.00 - 0.07 K/uL  Magnesium  Result Value Ref Range   Magnesium 2.4 1.7 - 2.4 mg/dL  Phosphorus  Result Value Ref Range   Phosphorus 3.6 2.5 - 4.6 mg/dL  Troponin I (High Sensitivity)  Result Value Ref Range   Troponin I (High Sensitivity) 11 <18 ng/L      Assessment & Plan:   Problem List Items Addressed This Visit   None   Visit Diagnoses    Diarrhea, unspecified type    -  Primary   Relevant Medications   dicyclomine (BENTYL) 10 MG capsule   Abdominal pain, unspecified abdominal location       Relevant Medications   dicyclomine (BENTYL) 10 MG capsule      Diarrhea / Abdominal Pain, acute - unspecified cause - RESOLVED < 24 hour episode, possible food trigger 1-2 days prior with spicy food, can be other unknown No rectal bleeding  Due for next colonoscopy routine 05/2021, prior was 2012  Fam history colitis / IBS - may be related  Trial on Dicyclomine PRN in future if flare OTC Peppermint Oil (Triple Coated Capsule) 180mg  take one 3 times daily to reduce diarrhea May use  Imodium PRN Improve diet / fiber / probiotic  Follow-up, future GI if indicated.   Meds ordered this encounter  Medications  . dicyclomine (BENTYL) 10 MG capsule    Sig: Take 1 capsule (10 mg total) by mouth 4 (four) times daily -  before meals and at bedtime. As needed for abdominal pain cramping    Dispense:  30 capsule    Refill:  2      Follow up plan: Return if symptoms worsen or fail to improve.   [name], [facility] 10/03/2020, 8:37 AM

## 2020-10-29 ENCOUNTER — Telehealth: Payer: Self-pay

## 2020-10-29 NOTE — Telephone Encounter (Signed)
Copied from [place] [id number]. Topic: General - Other >> Oct 29, 2020  1:33 PM Leward Quan A wrote: Reason for CRM: Patient called in to inform Dr Raliegh Ip that he has called the [facility] Neurology department in order to find out what his 20% payment will be for the procedure ordered by Dr Raliegh Ip. Patient states that it took forever to speak to someone in the office and they cant give him an answer to his question and is asking if there is anything that Dr Raliegh Ip can do to get him this information before he goes in for a visit. Please call Ph# [phone]    I called the pt to get some additional info about his request.   I advised him that he was referred to [facility] for tingling and numbness in his hands back in Dec. He was under the assumption that he would receive a procedure at his first visit and he said his insurance told him 20% was owed at the time of service and he wanted to know how much that would be. I advised him that his first visit most likely wouldn't be a procedure because their office has never seen him before..  I also tried to get him to check his co-pay info to see how much a specialty office is and that (that) was what he would need to pay at his appt. He then got frustrated and said not to worry about the appt. He said he had called their office back and canceled. I apologized for the inconvenience and told him to give them a call back if he changed his mind.

## 2020-11-22 ENCOUNTER — Other Ambulatory Visit: Payer: Self-pay | Admitting: Family Medicine

## 2020-11-22 DIAGNOSIS — R109 Unspecified abdominal pain: Secondary | ICD-10-CM

## 2020-11-22 DIAGNOSIS — R197 Diarrhea, unspecified: Secondary | ICD-10-CM

## 2020-11-22 DIAGNOSIS — K219 Gastro-esophageal reflux disease without esophagitis: Secondary | ICD-10-CM

## 2020-11-22 NOTE — Telephone Encounter (Signed)
Requested medication (s) are due for refill today: ?  Requested medication (s) are on the active medication list: No  Last refill:  ?  Future visit scheduled: No  Notes to clinic:  See request.    Requested Prescriptions  Pending Prescriptions Disp Refills   omeprazole (PRILOSEC) 40 MG capsule [Pharmacy Med Name: OMEPRAZOLE DR 40 MG CAP] 90 capsule     Sig: TAKE 1 CAPSULE BY MOUTH ONCE DAILY      Gastroenterology: Proton Pump Inhibitors Passed - 11/22/2020 12:19 PM      Passed - Valid encounter within last 12 months    Recent Outpatient Visits           1 month ago Diarrhea, unspecified type   [place], DO   3 months ago Pneumonia due to COVID-19 virus   [place], DO   3 months ago Pneumonia due to COVID-19 virus   [place], DO   4 months ago Pneumonia due to COVID-19 virus   [facility] [place], Devonne Dought[place], DO   7 months ago Acute non-recurrent frontal sinusitis   [place], DO                 Signed Prescriptions Disp Refills   dicyclomine (BENTYL) 10 MG capsule 30 capsule 2    Sig: TAKE 1 CAPSULE BY MOUTH 4 TIMES DAILY BEFORE MEALS AND AT BEDTIME AS NEEDED ABDOMINAL PAIN CRAMPING      Gastroenterology:  Antispasmodic Agents Passed - 11/22/2020 12:19 PM      Passed - Last Heart Rate in normal range    Pulse Readings from Last 1 Encounters:  10/03/20 78          Passed - Valid encounter within last 12 months    Recent Outpatient Visits           1 month ago Diarrhea, unspecified type   [place], DO   3 months ago Pneumonia due to COVID-19 virus   [place], DO   3 months ago Pneumonia due to COVID-19 virus   [place], DO   4 months  ago Pneumonia due to COVID-19 virus   [place], DO   7 months ago Acute non-recurrent frontal sinusitis   [place], [place]

## 2020-12-06 ENCOUNTER — Encounter: Payer: BC Managed Care – PPO | Admitting: Family Medicine

## 2020-12-06 ENCOUNTER — Ambulatory Visit: Payer: BC Managed Care – PPO | Admitting: Family Medicine

## 2020-12-09 ENCOUNTER — Ambulatory Visit: Payer: BC Managed Care – PPO | Admitting: Family Medicine

## 2020-12-10 ENCOUNTER — Other Ambulatory Visit: Payer: Self-pay | Admitting: Family Medicine

## 2020-12-10 ENCOUNTER — Ambulatory Visit: Payer: BC Managed Care – PPO | Admitting: Family Medicine

## 2020-12-10 ENCOUNTER — Encounter: Payer: Self-pay | Admitting: Family Medicine

## 2020-12-10 ENCOUNTER — Other Ambulatory Visit: Payer: Self-pay

## 2020-12-10 VITALS — BP 141/75 | HR 92 | Temp 97.5°F | Ht 68.0 in | Wt 245.8 lb

## 2020-12-10 DIAGNOSIS — R590 Localized enlarged lymph nodes: Secondary | ICD-10-CM

## 2020-12-10 DIAGNOSIS — J3089 Other allergic rhinitis: Secondary | ICD-10-CM

## 2020-12-10 DIAGNOSIS — Z125 Encounter for screening for malignant neoplasm of prostate: Secondary | ICD-10-CM

## 2020-12-10 DIAGNOSIS — R7303 Prediabetes: Secondary | ICD-10-CM

## 2020-12-10 DIAGNOSIS — E7801 Familial hypercholesterolemia: Secondary | ICD-10-CM

## 2020-12-10 DIAGNOSIS — Z0000 Encounter for general adult medical examination without abnormal findings: Secondary | ICD-10-CM

## 2020-12-10 DIAGNOSIS — I10 Essential (primary) hypertension: Secondary | ICD-10-CM

## 2020-12-10 MED ORDER — FLUTICASONE PROPIONATE 50 MCG/ACT NA SUSP: 2.0000 | Freq: Every day | NASAL | 3 refills | Status: DC

## 2020-12-10 NOTE — Patient Instructions (Addendum)
Thank you for coming to the office today.  Lymph nodes very minimal swelling, only some scattered swelling on Left side of front neck. But not at a concern at the moment it is likely from the sinuses.  For sinuses Start nasal steroid Flonase 2 sprays in each nostril daily for 4-6 weeks, may repeat course seasonally or as needed  We can monitor the lymph nodes in future it should be resolved or if it does come back we can order an ultrasound if needed.    DUE for FASTING BLOOD WORK (no food or drink after midnight before the lab appointment, only water or coffee without cream/sugar on the morning of)  SCHEDULE "Lab Only" visit in the morning at the clinic for lab draw in 1 MONTH  - Make sure Lab Only appointment is at about 1 week before your next appointment, so that results will be available  For Lab Results, once available within 2-3 days of blood draw, you can can log in to MyChart online to view your results and a brief explanation. Also, we can discuss results at next follow-up visit.    Please schedule a Follow-up Appointment to: Return in about 4 weeks (around 01/07/2021) for 4 week fasting lab only then 1 week later Annual Physical.  If you have any other questions or concerns, please feel free to call the office or send a message through [facility]. You may also schedule an earlier appointment if necessary.  Additionally, you may be receiving a survey about your experience at our office within a few days to 1 week by e-mail or mail. We value your feedback.  [name], DO [place]

## 2020-12-10 NOTE — Progress Notes (Signed)
Subjective:    Patient ID: Derek Cook, male    DOB: 1960/11/20, 59 y.o.   MRN: [id number]  Derek Cook is a 60 y.o. male presenting on 12/10/2020 for Hypertension (Pt was told by a PA at [facility] that he had swollen glands. He states he feels his normal self. 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 He had a DOT Physical about 1 week ago and they identified some swollen lymph nodes on both sides of neck, and asking for further evaluation. He admits significant stressors recently with truck and also life stressors impacting him. Admits issues with sleep, and work schedule impacting his sleep. Admits sinus persistent symptoms with post nasal drainage, and allergy symptoms, he has taken OTC Claritin.   Health Maintenance: Return for annual physical  Depression screen [facility] 2/9 08/19/2020 08/06/2020 07/22/2020  Decreased Interest 0 0 0  Down, Depressed, Hopeless 0 0 0  PHQ - 2 Score 0 0 0    Social History   Tobacco Use  . Smoking status: Former Smoker    Quit date: 09/07/2009    Years since quitting: 11.2  . Smokeless tobacco: Former Network engineer  . Vaping Use: Never used  Substance Use Topics  . Alcohol use: No  . Drug use: No    Review of Systems Per HPI unless specifically indicated above     Objective:    BP (!) 141/75 (BP Location: Left Arm, Patient Position: Sitting, Cuff Size: Large)   Pulse 92   Temp (!) 97.5 F (36.4 C) (Temporal)   Ht 5\' 8"  (1.727 m)   Wt 245 lb 12.8 oz (111.5 kg)   SpO2 98%   BMI 37.37 kg/m   Wt Readings from Last 3 Encounters:  12/10/20 245 lb 12.8 oz (111.5 kg)  10/03/20 241 lb (109.3 kg)  08/19/20 231 lb 9.6 oz (105.1 kg)    Physical Exam Vitals and nursing note reviewed.   Constitutional:      General: He is not in acute distress.    Appearance: He is well-developed. He is obese. He is not diaphoretic.     Comments: Well-appearing, comfortable, cooperative  HENT:     Head: Normocephalic and atraumatic.  Eyes:     General:        Right eye: No discharge.        Left eye: No discharge.     Conjunctiva/sclera: Conjunctivae normal.  Neck:     Thyroid: No thyromegaly.  Cardiovascular:     Rate and Rhythm: Normal rate and regular rhythm.     Heart sounds: Normal heart sounds. No murmur heard.   Pulmonary:     Effort: Pulmonary effort is normal. No respiratory distress.     Breath sounds: Normal breath sounds. No wheezing or rales.  Chest:  Breasts:     Right: No axillary adenopathy or supraclavicular adenopathy.     Left: No axillary adenopathy or supraclavicular adenopathy.    Musculoskeletal:        General: Normal range of motion.     Cervical back: Normal range of motion and neck supple.  Lymphadenopathy:     Cervical: Cervical adenopathy (left anterior mild scattered minimal LAD no focal point, no R sided LAD)  present.     Right cervical: No superficial, deep or posterior cervical adenopathy.    Left cervical: No superficial, deep or posterior cervical adenopathy.     Upper Body:     Right upper body: No supraclavicular or axillary adenopathy.     Left upper body: No supraclavicular or axillary adenopathy.  Skin:    General: Skin is warm and dry.     Findings: No erythema or rash.  Neurological:     Mental Status: He is alert and oriented to person, place, and time.  Psychiatric:        Behavior: Behavior normal.     Comments: Well groomed, good eye contact, normal speech and thoughts        Results for orders placed or performed during the hospital encounter of 06/25/20  CBC with Differential  Result Value Ref Range   WBC 7.5 4.0 - 10.5 K/uL   RBC 5.29 4.22 - 5.81 MIL/uL   Hemoglobin 14.7 13.0 - 17.0 g/dL   HCT 43.2 39.0 - 52.0  %   MCV 81.7 80.0 - 100.0 fL   MCH 27.8 26.0 - 34.0 pg   MCHC 34.0 30.0 - 36.0 g/dL   RDW 14.6 11.5 - 15.5 %   Platelets 242 150 - 400 K/uL   nRBC 0.0 0.0 - 0.2 %   Neutrophils Relative % 85 %   Neutro Abs 6.4 1.7 - 7.7 K/uL   Lymphocytes Relative 9 %   Lymphs Abs 0.7 0.7 - 4.0 K/uL   Monocytes Relative 6 %   Monocytes Absolute 0.4 0.1 - 1.0 K/uL   Eosinophils Relative 0 %   Eosinophils Absolute 0.0 0.0 - 0.5 K/uL   Basophils Relative 0 %   Basophils Absolute 0.0 0.0 - 0.1 K/uL   Immature Granulocytes 0 %   Abs Immature Granulocytes 0.02 0.00 - 0.07 K/uL  Comprehensive metabolic panel  Result Value Ref Range   Sodium 134 (L) 135 - 145 mmol/L   Potassium 4.2 3.5 - 5.1 mmol/L   Chloride 100 98 - 111 mmol/L   CO2 22 22 - 32 mmol/L   Glucose, Bld 120 (H) 70 - 99 mg/dL   BUN 20 6 - 20 mg/dL   Creatinine, Ser 0.93 0.61 - 1.24 mg/dL   Calcium 8.6 (L) 8.9 - 10.3 mg/dL   Total Protein 7.5 6.5 - 8.1 g/dL   Albumin 3.4 (L) 3.5 - 5.0 g/dL   AST 129 (H) 15 - 41 U/L   ALT 75 (H) 0 - 44 U/L   Alkaline Phosphatase 57 38 - 126 U/L   Total Bilirubin 1.1 0.3 - 1.2 mg/dL   GFR, Estimated >60 >60 mL/min   Anion gap 12 5 - 15  Lipase, blood  Result Value Ref Range   Lipase 29 11 - 51 U/L  Lactic acid, plasma  Result Value Ref Range   Lactic Acid, Venous 1.8 0.5 - 1.9 mmol/L  Urinalysis, Complete w Microscopic  Result Value Ref Range   Color, Urine YELLOW (A) YELLOW   APPearance CLEAR (A) CLEAR   Specific Gravity, Urine 1.019 1.005 - 1.030   pH 6.0 5.0 - 8.0   Glucose, UA NEGATIVE NEGATIVE mg/dL   Hgb urine dipstick NEGATIVE NEGATIVE   Bilirubin Urine NEGATIVE NEGATIVE   Ketones, ur 20 (A) NEGATIVE mg/dL   Protein, ur 30 (A) NEGATIVE mg/dL   Nitrite NEGATIVE NEGATIVE   Leukocytes,Ua NEGATIVE NEGATIVE   RBC / HPF 0-5 0 - 5 RBC/hpf  WBC, UA 0-5 0 - 5 WBC/hpf   Bacteria, UA NONE SEEN NONE SEEN   Squamous Epithelial / LPF NONE SEEN 0 - 5   Mucus PRESENT    Hyaline Casts, UA PRESENT    HIV Antibody (routine testing w rflx)  Result Value Ref Range   HIV Screen 4th Generation wRfx Non Reactive Non Reactive  Comprehensive metabolic panel  Result Value Ref Range   Sodium 138 135 - 145 mmol/L   Potassium 4.1 3.5 - 5.1 mmol/L   Chloride 103 98 - 111 mmol/L   CO2 22 22 - 32 mmol/L   Glucose, Bld 147 (H) 70 - 99 mg/dL   BUN 21 (H) 6 - 20 mg/dL   Creatinine, Ser 0.98 0.61 - 1.24 mg/dL   Calcium 8.7 (L) 8.9 - 10.3 mg/dL   Total Protein 6.7 6.5 - 8.1 g/dL   Albumin 3.2 (L) 3.5 - 5.0 g/dL   AST 105 (H) 15 - 41 U/L   ALT 70 (H) 0 - 44 U/L   Alkaline Phosphatase 60 38 - 126 U/L   Total Bilirubin 0.9 0.3 - 1.2 mg/dL   GFR, Estimated >60 >60 mL/min   Anion gap 13 5 - 15  CBC WITH DIFFERENTIAL  Result Value Ref Range   WBC 3.8 (L) 4.0 - 10.5 K/uL   RBC 5.06 4.22 - 5.81 MIL/uL   Hemoglobin 14.2 13.0 - 17.0 g/dL   HCT 42.6 39.0 - 52.0 %   MCV 84.2 80.0 - 100.0 fL   MCH 28.1 26.0 - 34.0 pg   MCHC 33.3 30.0 - 36.0 g/dL   RDW 14.7 11.5 - 15.5 %   Platelets 222 150 - 400 K/uL   nRBC 0.0 0.0 - 0.2 %   Neutrophils Relative % 77 %   Neutro Abs 3.0 1.7 - 7.7 K/uL   Lymphocytes Relative 15 %   Lymphs Abs 0.6 (L) 0.7 - 4.0 K/uL   Monocytes Relative 7 %   Monocytes Absolute 0.3 0.1 - 1.0 K/uL   Eosinophils Relative 0 %   Eosinophils Absolute 0.0 0.0 - 0.5 K/uL   Basophils Relative 0 %   Basophils Absolute 0.0 0.0 - 0.1 K/uL   Immature Granulocytes 1 %   Abs Immature Granulocytes 0.02 0.00 - 0.07 K/uL  Magnesium  Result Value Ref Range   Magnesium 2.4 1.7 - 2.4 mg/dL  Phosphorus  Result Value Ref Range   Phosphorus 4.2 2.5 - 4.6 mg/dL  Blood gas, arterial  Result Value Ref Range   FIO2 0.40    Delivery systems NASAL CANNULA    pH, Arterial 7.42 7.350 - 7.450   pCO2 arterial 33 32.0 - 48.0 mmHg   pO2, Arterial 63 (L) 83.0 - 108.0 mmHg   Bicarbonate 21.4 20.0 - 28.0 mmol/L   Acid-base deficit 2.2 (H) 0.0 - 2.0 mmol/L   O2 Saturation 92.2 %   Patient temperature 37.0     Collection site RIGHT RADIAL    Sample type ARTERIAL DRAW    Allens test (pass/fail) PASS PASS  C-reactive protein  Result Value Ref Range   CRP 17.3 (H) <1.0 mg/dL  Ferritin  Result Value Ref Range   Ferritin 1,626 (H) 24 - 336 ng/mL  Fibrinogen  Result Value Ref Range   Fibrinogen 726 (H) 210 - 475 mg/dL  Lactate dehydrogenase  Result Value Ref Range   LDH 459 (H) 98 - 192 U/L  Magnesium  Result Value Ref Range   Magnesium 2.5 (H) 1.7 -  2.4 mg/dL  Procalcitonin - Baseline  Result Value Ref Range   Procalcitonin 0.63 ng/mL  C-reactive protein  Result Value Ref Range   CRP 8.4 (H) <1.0 mg/dL  Procalcitonin  Result Value Ref Range   Procalcitonin 0.51 ng/mL  CBC  Result Value Ref Range   WBC 8.4 4.0 - 10.5 K/uL   RBC 4.87 4.22 - 5.81 MIL/uL   Hemoglobin 13.9 13.0 - 17.0 g/dL   HCT 41.3 39.0 - 52.0 %   MCV 84.8 80.0 - 100.0 fL   MCH 28.5 26.0 - 34.0 pg   MCHC 33.7 30.0 - 36.0 g/dL   RDW 14.5 11.5 - 15.5 %   Platelets 299 150 - 400 K/uL   nRBC 0.0 0.0 - 0.2 %  Comprehensive metabolic panel  Result Value Ref Range   Sodium 138 135 - 145 mmol/L   Potassium 4.5 3.5 - 5.1 mmol/L   Chloride 105 98 - 111 mmol/L   CO2 20 (L) 22 - 32 mmol/L   Glucose, Bld 134 (H) 70 - 99 mg/dL   BUN 21 (H) 6 - 20 mg/dL   Creatinine, Ser 0.74 0.61 - 1.24 mg/dL   Calcium 8.3 (L) 8.9 - 10.3 mg/dL   Total Protein 6.5 6.5 - 8.1 g/dL   Albumin 3.0 (L) 3.5 - 5.0 g/dL   AST 75 (H) 15 - 41 U/L   ALT 72 (H) 0 - 44 U/L   Alkaline Phosphatase 63 38 - 126 U/L   Total Bilirubin 0.9 0.3 - 1.2 mg/dL   GFR, Estimated >60 >60 mL/min   Anion gap 13 5 - 15  Magnesium  Result Value Ref Range   Magnesium 2.6 (H) 1.7 - 2.4 mg/dL  Fibrin derivatives D-Dimer ([facility] only)  Result Value Ref Range   Fibrin derivatives D-dimer ([facility]) 3,157.14 (H) 0.00 - 499.00 ng/mL (FEU)  Glucose, capillary  Result Value Ref Range   Glucose-Capillary 128 (H) 70 - 99 mg/dL  C-reactive protein  Result Value Ref Range    CRP 3.2 (H) <1.0 mg/dL  Procalcitonin  Result Value Ref Range   Procalcitonin 0.25 ng/mL  Comprehensive metabolic panel  Result Value Ref Range   Sodium 136 135 - 145 mmol/L   Potassium 4.4 3.5 - 5.1 mmol/L   Chloride 104 98 - 111 mmol/L   CO2 22 22 - 32 mmol/L   Glucose, Bld 135 (H) 70 - 99 mg/dL   BUN 25 (H) 6 - 20 mg/dL   Creatinine, Ser 0.80 0.61 - 1.24 mg/dL   Calcium 8.5 (L) 8.9 - 10.3 mg/dL   Total Protein 6.4 (L) 6.5 - 8.1 g/dL   Albumin 3.0 (L) 3.5 - 5.0 g/dL   AST 46 (H) 15 - 41 U/L   ALT 69 (H) 0 - 44 U/L   Alkaline Phosphatase 64 38 - 126 U/L   Total Bilirubin 1.0 0.3 - 1.2 mg/dL   GFR, Estimated >60 >60 mL/min   Anion gap 10 5 - 15  Fibrin derivatives D-Dimer ([facility] only)  Result Value Ref Range   Fibrin derivatives D-dimer ([facility]) >7,500.00 (H) 0.00 - 499.00 ng/mL (FEU)  C-reactive protein  Result Value Ref Range   CRP 1.3 (H) <1.0 mg/dL  Comprehensive metabolic panel  Result Value Ref Range   Sodium 134 (L) 135 - 145 mmol/L   Potassium 4.6 3.5 - 5.1 mmol/L   Chloride 103 98 - 111 mmol/L   CO2 23 22 - 32 mmol/L   Glucose, Bld 160 (H) 70 -  99 mg/dL   BUN 26 (H) 6 - 20 mg/dL   Creatinine, Ser 0.94 0.61 - 1.24 mg/dL   Calcium 8.3 (L) 8.9 - 10.3 mg/dL   Total Protein 6.1 (L) 6.5 - 8.1 g/dL   Albumin 3.0 (L) 3.5 - 5.0 g/dL   AST 42 (H) 15 - 41 U/L   ALT 69 (H) 0 - 44 U/L   Alkaline Phosphatase 57 38 - 126 U/L   Total Bilirubin 0.9 0.3 - 1.2 mg/dL   GFR, Estimated >60 >60 mL/min   Anion gap 8 5 - 15  Fibrin derivatives D-Dimer ([facility] only)  Result Value Ref Range   Fibrin derivatives D-dimer ([facility]) 5,539.67 (H) 0.00 - 499.00 ng/mL (FEU)  C-reactive protein  Result Value Ref Range   CRP 0.6 <1.0 mg/dL  Comprehensive metabolic panel  Result Value Ref Range   Sodium 134 (L) 135 - 145 mmol/L   Potassium 4.7 3.5 - 5.1 mmol/L   Chloride 103 98 - 111 mmol/L   CO2 24 22 - 32 mmol/L   Glucose, Bld 148 (H) 70 - 99 mg/dL   BUN 25 (H) 6 - 20 mg/dL   Creatinine,  Ser 0.88 0.61 - 1.24 mg/dL   Calcium 8.4 (L) 8.9 - 10.3 mg/dL   Total Protein 6.1 (L) 6.5 - 8.1 g/dL   Albumin 3.1 (L) 3.5 - 5.0 g/dL   AST 43 (H) 15 - 41 U/L   ALT 81 (H) 0 - 44 U/L   Alkaline Phosphatase 55 38 - 126 U/L   Total Bilirubin 1.0 0.3 - 1.2 mg/dL   GFR, Estimated >60 >60 mL/min   Anion gap 7 5 - 15  Fibrin derivatives D-Dimer ([facility] only)  Result Value Ref Range   Fibrin derivatives D-dimer ([facility]) 3,263.67 (H) 0.00 - 499.00 ng/mL (FEU)  C-reactive protein  Result Value Ref Range   CRP 0.5 <1.0 mg/dL  Comprehensive metabolic panel  Result Value Ref Range   Sodium 132 (L) 135 - 145 mmol/L   Potassium 4.4 3.5 - 5.1 mmol/L   Chloride 100 98 - 111 mmol/L   CO2 23 22 - 32 mmol/L   Glucose, Bld 126 (H) 70 - 99 mg/dL   BUN 26 (H) 6 - 20 mg/dL   Creatinine, Ser 1.07 0.61 - 1.24 mg/dL   Calcium 8.8 (L) 8.9 - 10.3 mg/dL   Total Protein 6.4 (L) 6.5 - 8.1 g/dL   Albumin 3.2 (L) 3.5 - 5.0 g/dL   AST 37 15 - 41 U/L   ALT 85 (H) 0 - 44 U/L   Alkaline Phosphatase 55 38 - 126 U/L   Total Bilirubin 1.0 0.3 - 1.2 mg/dL   GFR, Estimated >60 >60 mL/min   Anion gap 9 5 - 15  Fibrin derivatives D-Dimer ([facility] only)  Result Value Ref Range   Fibrin derivatives D-dimer ([facility]) 2,335.87 (H) 0.00 - 499.00 ng/mL (FEU)  C-reactive protein  Result Value Ref Range   CRP 1.7 (H) <1.0 mg/dL  Comprehensive metabolic panel  Result Value Ref Range   Sodium 133 (L) 135 - 145 mmol/L   Potassium 4.5 3.5 - 5.1 mmol/L   Chloride 103 98 - 111 mmol/L   CO2 21 (L) 22 - 32 mmol/L   Glucose, Bld 143 (H) 70 - 99 mg/dL   BUN 24 (H) 6 - 20 mg/dL   Creatinine, Ser 0.90 0.61 - 1.24 mg/dL   Calcium 8.5 (L) 8.9 - 10.3 mg/dL   Total Protein 6.2 (L) 6.5 -  8.1 g/dL   Albumin 3.1 (L) 3.5 - 5.0 g/dL   AST 41 15 - 41 U/L   ALT 92 (H) 0 - 44 U/L   Alkaline Phosphatase 62 38 - 126 U/L   Total Bilirubin 1.0 0.3 - 1.2 mg/dL   GFR, Estimated >60 >60 mL/min   Anion gap 9 5 - 15  Fibrin derivatives D-Dimer ([facility]  only)  Result Value Ref Range   Fibrin derivatives D-dimer ([facility]) 1,997.30 (H) 0.00 - 499.00 ng/mL (FEU)  CBC  Result Value Ref Range   WBC 14.3 (H) 4.0 - 10.5 K/uL   RBC 5.29 4.22 - 5.81 MIL/uL   Hemoglobin 14.9 13.0 - 17.0 g/dL   HCT 44.3 39.0 - 52.0 %   MCV 83.7 80.0 - 100.0 fL   MCH 28.2 26.0 - 34.0 pg   MCHC 33.6 30.0 - 36.0 g/dL   RDW 14.2 11.5 - 15.5 %   Platelets 590 (H) 150 - 400 K/uL   nRBC 0.0 0.0 - 0.2 %  C-reactive protein  Result Value Ref Range   CRP 0.5 <1.0 mg/dL  Comprehensive metabolic panel  Result Value Ref Range   Sodium 132 (L) 135 - 145 mmol/L   Potassium 4.4 3.5 - 5.1 mmol/L   Chloride 101 98 - 111 mmol/L   CO2 23 22 - 32 mmol/L   Glucose, Bld 114 (H) 70 - 99 mg/dL   BUN 22 (H) 6 - 20 mg/dL   Creatinine, Ser 0.74 0.61 - 1.24 mg/dL   Calcium 8.5 (L) 8.9 - 10.3 mg/dL   Total Protein 6.1 (L) 6.5 - 8.1 g/dL   Albumin 3.1 (L) 3.5 - 5.0 g/dL   AST 34 15 - 41 U/L   ALT 90 (H) 0 - 44 U/L   Alkaline Phosphatase 50 38 - 126 U/L   Total Bilirubin 0.8 0.3 - 1.2 mg/dL   GFR, Estimated >60 >60 mL/min   Anion gap 8 5 - 15  Fibrin derivatives D-Dimer ([facility] only)  Result Value Ref Range   Fibrin derivatives D-dimer ([facility]) 1,462.84 (H) 0.00 - 499.00 ng/mL (FEU)  Comprehensive metabolic panel  Result Value Ref Range   Sodium 131 (L) 135 - 145 mmol/L   Potassium 4.6 3.5 - 5.1 mmol/L   Chloride 98 98 - 111 mmol/L   CO2 23 22 - 32 mmol/L   Glucose, Bld 134 (H) 70 - 99 mg/dL   BUN 27 (H) 6 - 20 mg/dL   Creatinine, Ser 0.95 0.61 - 1.24 mg/dL   Calcium 8.5 (L) 8.9 - 10.3 mg/dL   Total Protein 6.3 (L) 6.5 - 8.1 g/dL   Albumin 3.2 (L) 3.5 - 5.0 g/dL   AST 43 (H) 15 - 41 U/L   ALT 98 (H) 0 - 44 U/L   Alkaline Phosphatase 58 38 - 126 U/L   Total Bilirubin 1.1 0.3 - 1.2 mg/dL   GFR, Estimated >60 >60 mL/min   Anion gap 10 5 - 15  Fibrin derivatives D-Dimer ([facility] only)  Result Value Ref Range   Fibrin derivatives D-dimer ([facility]) 1,263.46 (H) 0.00 - 499.00 ng/mL  (FEU)  Comprehensive metabolic panel  Result Value Ref Range   Sodium 131 (L) 135 - 145 mmol/L   Potassium 4.9 3.5 - 5.1 mmol/L   Chloride 98 98 - 111 mmol/L   CO2 24 22 - 32 mmol/L   Glucose, Bld 143 (H) 70 - 99 mg/dL   BUN 26 (H) 6 - 20 mg/dL   Creatinine, Ser  0.98 0.61 - 1.24 mg/dL   Calcium 8.8 (L) 8.9 - 10.3 mg/dL   Total Protein 6.4 (L) 6.5 - 8.1 g/dL   Albumin 3.3 (L) 3.5 - 5.0 g/dL   AST 42 (H) 15 - 41 U/L   ALT 106 (H) 0 - 44 U/L   Alkaline Phosphatase 61 38 - 126 U/L   Total Bilirubin 1.3 (H) 0.3 - 1.2 mg/dL   GFR, Estimated >60 >60 mL/min   Anion gap 9 5 - 15  Fibrin derivatives D-Dimer ([facility] only)  Result Value Ref Range   Fibrin derivatives D-dimer ([facility]) 1,085.87 (H) 0.00 - 499.00 ng/mL (FEU)  CBC  Result Value Ref Range   WBC 18.4 (H) 4.0 - 10.5 K/uL   RBC 5.31 4.22 - 5.81 MIL/uL   Hemoglobin 15.2 13.0 - 17.0 g/dL   HCT 44.5 39.0 - 52.0 %   MCV 83.8 80.0 - 100.0 fL   MCH 28.6 26.0 - 34.0 pg   MCHC 34.2 30.0 - 36.0 g/dL   RDW 14.4 11.5 - 15.5 %   Platelets 557 (H) 150 - 400 K/uL   nRBC 0.1 0.0 - 0.2 %  Comprehensive metabolic panel  Result Value Ref Range   Sodium 130 (L) 135 - 145 mmol/L   Potassium 4.8 3.5 - 5.1 mmol/L   Chloride 94 (L) 98 - 111 mmol/L   CO2 26 22 - 32 mmol/L   Glucose, Bld 137 (H) 70 - 99 mg/dL   BUN 29 (H) 6 - 20 mg/dL   Creatinine, Ser 0.93 0.61 - 1.24 mg/dL   Calcium 8.9 8.9 - 10.3 mg/dL   Total Protein 6.1 (L) 6.5 - 8.1 g/dL   Albumin 3.2 (L) 3.5 - 5.0 g/dL   AST 54 (H) 15 - 41 U/L   ALT 143 (H) 0 - 44 U/L   Alkaline Phosphatase 61 38 - 126 U/L   Total Bilirubin 1.0 0.3 - 1.2 mg/dL   GFR, Estimated >60 >60 mL/min   Anion gap 10 5 - 15  Magnesium  Result Value Ref Range   Magnesium 2.6 (H) 1.7 - 2.4 mg/dL  Comprehensive metabolic panel  Result Value Ref Range   Sodium 133 (L) 135 - 145 mmol/L   Potassium 4.4 3.5 - 5.1 mmol/L   Chloride 96 (L) 98 - 111 mmol/L   CO2 25 22 - 32 mmol/L   Glucose, Bld 145 (H) 70 - 99 mg/dL    BUN 33 (H) 6 - 20 mg/dL   Creatinine, Ser 1.09 0.61 - 1.24 mg/dL   Calcium 8.5 (L) 8.9 - 10.3 mg/dL   Total Protein 5.8 (L) 6.5 - 8.1 g/dL   Albumin 3.1 (L) 3.5 - 5.0 g/dL   AST 73 (H) 15 - 41 U/L   ALT 179 (H) 0 - 44 U/L   Alkaline Phosphatase 63 38 - 126 U/L   Total Bilirubin 1.1 0.3 - 1.2 mg/dL   GFR, Estimated >60 >60 mL/min   Anion gap 12 5 - 15  CBC  Result Value Ref Range   WBC 18.4 (H) 4.0 - 10.5 K/uL   RBC 5.18 4.22 - 5.81 MIL/uL   Hemoglobin 14.8 13.0 - 17.0 g/dL   HCT 43.8 39.0 - 52.0 %   MCV 84.6 80.0 - 100.0 fL   MCH 28.6 26.0 - 34.0 pg   MCHC 33.8 30.0 - 36.0 g/dL   RDW 14.5 11.5 - 15.5 %   Platelets 481 (H) 150 - 400 K/uL   nRBC 0.0 0.0 -  0.2 %  Comprehensive metabolic panel  Result Value Ref Range   Sodium 132 (L) 135 - 145 mmol/L   Potassium 4.6 3.5 - 5.1 mmol/L   Chloride 93 (L) 98 - 111 mmol/L   CO2 27 22 - 32 mmol/L   Glucose, Bld 132 (H) 70 - 99 mg/dL   BUN 33 (H) 6 - 20 mg/dL   Creatinine, Ser 1.01 0.61 - 1.24 mg/dL   Calcium 8.9 8.9 - 10.3 mg/dL   Total Protein 6.1 (L) 6.5 - 8.1 g/dL   Albumin 3.2 (L) 3.5 - 5.0 g/dL   AST 108 (H) 15 - 41 U/L   ALT 281 (H) 0 - 44 U/L   Alkaline Phosphatase 62 38 - 126 U/L   Total Bilirubin 1.2 0.3 - 1.2 mg/dL   GFR, Estimated >60 >60 mL/min   Anion gap 12 5 - 15  Comprehensive metabolic panel  Result Value Ref Range   Sodium 130 (L) 135 - 145 mmol/L   Potassium 4.6 3.5 - 5.1 mmol/L   Chloride 91 (L) 98 - 111 mmol/L   CO2 27 22 - 32 mmol/L   Glucose, Bld 117 (H) 70 - 99 mg/dL   BUN 31 (H) 6 - 20 mg/dL   Creatinine, Ser 0.94 0.61 - 1.24 mg/dL   Calcium 8.4 (L) 8.9 - 10.3 mg/dL   Total Protein 5.9 (L) 6.5 - 8.1 g/dL   Albumin 3.2 (L) 3.5 - 5.0 g/dL   AST 188 (H) 15 - 41 U/L   ALT 495 (H) 0 - 44 U/L   Alkaline Phosphatase 72 38 - 126 U/L   Total Bilirubin 1.2 0.3 - 1.2 mg/dL   GFR, Estimated >60 >60 mL/min   Anion gap 12 5 - 15  CBC with Differential/Platelet  Result Value Ref Range   WBC 17.7 (H) 4.0  - 10.5 K/uL   RBC 5.03 4.22 - 5.81 MIL/uL   Hemoglobin 14.5 13.0 - 17.0 g/dL   HCT 43.0 39.0 - 52.0 %   MCV 85.5 80.0 - 100.0 fL   MCH 28.8 26.0 - 34.0 pg   MCHC 33.7 30.0 - 36.0 g/dL   RDW 14.8 11.5 - 15.5 %   Platelets 351 150 - 400 K/uL   nRBC 0.0 0.0 - 0.2 %   Neutrophils Relative % 79 %   Neutro Abs 14.0 (H) 1.7 - 7.7 K/uL   Lymphocytes Relative 6 %   Lymphs Abs 1.0 0.7 - 4.0 K/uL   Monocytes Relative 10 %   Monocytes Absolute 1.8 (H) 0.1 - 1.0 K/uL   Eosinophils Relative 0 %   Eosinophils Absolute 0.0 0.0 - 0.5 K/uL   Basophils Relative 0 %   Basophils Absolute 0.0 0.0 - 0.1 K/uL   Immature Granulocytes 5 %   Abs Immature Granulocytes 0.83 (H) 0.00 - 0.07 K/uL  Comprehensive metabolic panel  Result Value Ref Range   Sodium 133 (L) 135 - 145 mmol/L   Potassium 4.6 3.5 - 5.1 mmol/L   Chloride 91 (L) 98 - 111 mmol/L   CO2 26 22 - 32 mmol/L   Glucose, Bld 160 (H) 70 - 99 mg/dL   BUN 35 (H) 6 - 20 mg/dL   Creatinine, Ser 1.00 0.61 - 1.24 mg/dL   Calcium 9.1 8.9 - 10.3 mg/dL   Total Protein 5.9 (L) 6.5 - 8.1 g/dL   Albumin 3.1 (L) 3.5 - 5.0 g/dL   AST 153 (H) 15 - 41 U/L   ALT 496 (H) 0 -  44 U/L   Alkaline Phosphatase 77 38 - 126 U/L   Total Bilirubin 1.4 (H) 0.3 - 1.2 mg/dL   GFR, Estimated >60 >60 mL/min   Anion gap 16 (H) 5 - 15  Hepatitis panel, acute  Result Value Ref Range   Hepatitis B Surface Ag NON REACTIVE NON REACTIVE   HCV Ab NON REACTIVE NON REACTIVE   Hep A IgM NON REACTIVE NON REACTIVE   Hep B C IgM NON REACTIVE NON REACTIVE  Procalcitonin - Baseline  Result Value Ref Range   Procalcitonin 0.30 ng/mL  Brain natriuretic peptide  Result Value Ref Range   B Natriuretic Peptide 82.3 0.0 - 100.0 pg/mL  Basic metabolic panel  Result Value Ref Range   Sodium 131 (L) 135 - 145 mmol/L   Potassium 5.1 3.5 - 5.1 mmol/L   Chloride 90 (L) 98 - 111 mmol/L   CO2 27 22 - 32 mmol/L   Glucose, Bld 124 (H) 70 - 99 mg/dL   BUN 30 (H) 6 - 20 mg/dL   Creatinine,  Ser 1.00 0.61 - 1.24 mg/dL   Calcium 8.6 (L) 8.9 - 10.3 mg/dL   GFR, Estimated >60 >60 mL/min   Anion gap 14 5 - 15  Magnesium  Result Value Ref Range   Magnesium 2.7 (H) 1.7 - 2.4 mg/dL  CBC with Differential/Platelet  Result Value Ref Range   WBC 18.9 (H) 4.0 - 10.5 K/uL   RBC 5.03 4.22 - 5.81 MIL/uL   Hemoglobin 14.4 13.0 - 17.0 g/dL   HCT 43.1 39.0 - 52.0 %   MCV 85.7 80.0 - 100.0 fL   MCH 28.6 26.0 - 34.0 pg   MCHC 33.4 30.0 - 36.0 g/dL   RDW 15.1 11.5 - 15.5 %   Platelets 314 150 - 400 K/uL   nRBC 0.0 0.0 - 0.2 %   Neutrophils Relative % 76 %   Neutro Abs 14.5 (H) 1.7 - 7.7 K/uL   Lymphocytes Relative 8 %   Lymphs Abs 1.5 0.7 - 4.0 K/uL   Monocytes Relative 9 %   Monocytes Absolute 1.6 (H) 0.1 - 1.0 K/uL   Eosinophils Relative 0 %   Eosinophils Absolute 0.0 0.0 - 0.5 K/uL   Basophils Relative 1 %   Basophils Absolute 0.1 0.0 - 0.1 K/uL   WBC Morphology MORPHOLOGY UNREMARKABLE    RBC Morphology MORPHOLOGY UNREMARKABLE    Smear Review Normal platelet morphology    Immature Granulocytes 6 %   Abs Immature Granulocytes 1.15 (H) 0.00 - 0.07 K/uL  Hepatic function panel  Result Value Ref Range   Total Protein 5.8 (L) 6.5 - 8.1 g/dL   Albumin 2.9 (L) 3.5 - 5.0 g/dL   AST 163 (H) 15 - 41 U/L   ALT 547 (H) 0 - 44 U/L   Alkaline Phosphatase 71 38 - 126 U/L   Total Bilirubin 1.4 (H) 0.3 - 1.2 mg/dL   Bilirubin, Direct 0.3 (H) 0.0 - 0.2 mg/dL   Indirect Bilirubin 1.1 (H) 0.3 - 0.9 mg/dL  CBC with Differential/Platelet  Result Value Ref Range   WBC 17.2 (H) 4.0 - 10.5 K/uL   RBC 4.75 4.22 - 5.81 MIL/uL   Hemoglobin 13.4 13.0 - 17.0 g/dL   HCT 41.4 39.0 - 52.0 %   MCV 87.2 80.0 - 100.0 fL   MCH 28.2 26.0 - 34.0 pg   MCHC 32.4 30.0 - 36.0 g/dL   RDW 15.0 11.5 - 15.5 %   Platelets 257 150 -  400 K/uL   nRBC 0.0 0.0 - 0.2 %   Neutrophils Relative % 77 %   Neutro Abs 13.3 (H) 1.7 - 7.7 K/uL   Lymphocytes Relative 7 %   Lymphs Abs 1.2 0.7 - 4.0 K/uL   Monocytes Relative  7 %   Monocytes Absolute 1.2 (H) 0.1 - 1.0 K/uL   Eosinophils Relative 0 %   Eosinophils Absolute 0.0 0.0 - 0.5 K/uL   Basophils Relative 1 %   Basophils Absolute 0.1 0.0 - 0.1 K/uL   WBC Morphology MORPHOLOGY UNREMARKABLE    RBC Morphology MORPHOLOGY UNREMARKABLE    Smear Review Normal platelet morphology    Immature Granulocytes 8 %   Abs Immature Granulocytes 1.40 (H) 0.00 - 0.07 K/uL  Basic metabolic panel  Result Value Ref Range   Sodium 129 (L) 135 - 145 mmol/L   Potassium 4.7 3.5 - 5.1 mmol/L   Chloride 94 (L) 98 - 111 mmol/L   CO2 23 22 - 32 mmol/L   Glucose, Bld 171 (H) 70 - 99 mg/dL   BUN 24 (H) 6 - 20 mg/dL   Creatinine, Ser 0.97 0.61 - 1.24 mg/dL   Calcium 8.8 (L) 8.9 - 10.3 mg/dL   GFR, Estimated >60 >60 mL/min   Anion gap 12 5 - 15  Magnesium  Result Value Ref Range   Magnesium 2.6 (H) 1.7 - 2.4 mg/dL  CBC with Differential/Platelet  Result Value Ref Range   WBC 17.6 (H) 4.0 - 10.5 K/uL   RBC 4.89 4.22 - 5.81 MIL/uL   Hemoglobin 13.7 13.0 - 17.0 g/dL   HCT 41.8 39.0 - 52.0 %   MCV 85.5 80.0 - 100.0 fL   MCH 28.0 26.0 - 34.0 pg   MCHC 32.8 30.0 - 36.0 g/dL   RDW 15.2 11.5 - 15.5 %   Platelets 225 150 - 400 K/uL   nRBC 0.1 0.0 - 0.2 %   Neutrophils Relative % 76 %   Neutro Abs 13.5 (H) 1.7 - 7.7 K/uL   Lymphocytes Relative 8 %   Lymphs Abs 1.4 0.7 - 4.0 K/uL   Monocytes Relative 6 %   Monocytes Absolute 1.0 0.1 - 1.0 K/uL   Eosinophils Relative 0 %   Eosinophils Absolute 0.0 0.0 - 0.5 K/uL   Basophils Relative 1 %   Basophils Absolute 0.1 0.0 - 0.1 K/uL   WBC Morphology MILD LEFT SHIFT (1-5% METAS, OCC MYELO, OCC BANDS)    RBC Morphology MORPHOLOGY UNREMARKABLE    Smear Review Normal platelet morphology    Immature Granulocytes 9 %   Abs Immature Granulocytes 1.60 (H) 0.00 - 0.07 K/uL  Basic metabolic panel  Result Value Ref Range   Sodium 131 (L) 135 - 145 mmol/L   Potassium 4.8 3.5 - 5.1 mmol/L   Chloride 97 (L) 98 - 111 mmol/L   CO2 23 22 - 32  mmol/L   Glucose, Bld 138 (H) 70 - 99 mg/dL   BUN 23 (H) 6 - 20 mg/dL   Creatinine, Ser 0.81 0.61 - 1.24 mg/dL   Calcium 8.5 (L) 8.9 - 10.3 mg/dL   GFR, Estimated >60 >60 mL/min   Anion gap 11 5 - 15  Magnesium  Result Value Ref Range   Magnesium 2.6 (H) 1.7 - 2.4 mg/dL  Hepatic function panel  Result Value Ref Range   Total Protein 5.6 (L) 6.5 - 8.1 g/dL   Albumin 2.9 (L) 3.5 - 5.0 g/dL   AST 125 (H) 15 - 41  U/L   ALT 549 (H) 0 - 44 U/L   Alkaline Phosphatase 71 38 - 126 U/L   Total Bilirubin 0.9 0.3 - 1.2 mg/dL   Bilirubin, Direct 0.2 0.0 - 0.2 mg/dL   Indirect Bilirubin 0.7 0.3 - 0.9 mg/dL  Comprehensive metabolic panel  Result Value Ref Range   Sodium 132 (L) 135 - 145 mmol/L   Potassium 4.7 3.5 - 5.1 mmol/L   Chloride 99 98 - 111 mmol/L   CO2 25 22 - 32 mmol/L   Glucose, Bld 118 (H) 70 - 99 mg/dL   BUN 21 (H) 6 - 20 mg/dL   Creatinine, Ser 0.85 0.61 - 1.24 mg/dL   Calcium 8.8 (L) 8.9 - 10.3 mg/dL   Total Protein 5.8 (L) 6.5 - 8.1 g/dL   Albumin 2.9 (L) 3.5 - 5.0 g/dL   AST 123 (H) 15 - 41 U/L   ALT 537 (H) 0 - 44 U/L   Alkaline Phosphatase 77 38 - 126 U/L   Total Bilirubin 0.9 0.3 - 1.2 mg/dL   GFR, Estimated >60 >60 mL/min   Anion gap 8 5 - 15  CBC with Differential/Platelet  Result Value Ref Range   WBC 17.0 (H) 4.0 - 10.5 K/uL   RBC 4.83 4.22 - 5.81 MIL/uL   Hemoglobin 13.7 13.0 - 17.0 g/dL   HCT 42.0 39.0 - 52.0 %   MCV 87.0 80.0 - 100.0 fL   MCH 28.4 26.0 - 34.0 pg   MCHC 32.6 30.0 - 36.0 g/dL   RDW 15.7 (H) 11.5 - 15.5 %   Platelets 201 150 - 400 K/uL   nRBC 0.4 (H) 0.0 - 0.2 %   Neutrophils Relative % 76 %   Neutro Abs 12.8 (H) 1.7 - 7.7 K/uL   Lymphocytes Relative 9 %   Lymphs Abs 1.6 0.7 - 4.0 K/uL   Monocytes Relative 5 %   Monocytes Absolute 0.9 0.1 - 1.0 K/uL   Eosinophils Relative 0 %   Eosinophils Absolute 0.0 0.0 - 0.5 K/uL   Basophils Relative 1 %   Basophils Absolute 0.1 0.0 - 0.1 K/uL   WBC Morphology MILD LEFT SHIFT (1-5% METAS, OCC  MYELO, OCC BANDS)    RBC Morphology MORPHOLOGY UNREMARKABLE    Smear Review Normal platelet morphology    nRBC 0 0 /100 WBC   Immature Granulocytes 9 %   Abs Immature Granulocytes 1.57 (H) 0.00 - 0.07 K/uL  Magnesium  Result Value Ref Range   Magnesium 2.4 1.7 - 2.4 mg/dL  Phosphorus  Result Value Ref Range   Phosphorus 3.6 2.5 - 4.6 mg/dL  Troponin I (High Sensitivity)  Result Value Ref Range   Troponin I (High Sensitivity) 11 <18 ng/L      Assessment & Plan:   Problem List Items Addressed This Visit    Allergic rhinitis due to allergen - Primary   Relevant Medications   fluticasone (FLONASE) 50 MCG/ACT nasal spray    Other Visit Diagnoses    Lymphadenopathy, cervical          Clinically with mild or minimal R>L anterior lymphadenopathy, seems resolving at this time, no focal abnormal larger lesion. No other lymphadenopathy identified head neck or axillary or clavicular.  Reassurance given.  Likely with rhinosinusitis allergic etiology now. Start nasal steroid Flonase 2 sprays in each nostril daily for 4-6 weeks, may repeat course seasonally or as needed    Meds ordered this encounter  Medications  . fluticasone (FLONASE) 50 MCG/ACT nasal spray  Sig: Place 2 sprays into both nostrils daily. Use for 4-6 weeks then stop and use seasonally or as needed.    Dispense:  16 g    Refill:  3     Follow up plan: Return in about 4 weeks (around 01/07/2021) for 4 week fasting lab only then 1 week later Annual Physical.  Future labs ordered for 01/07/21    [name], [facility] 12/10/2020, 1:32 PM

## 2021-01-07 ENCOUNTER — Other Ambulatory Visit: Payer: BC Managed Care – PPO

## 2021-01-07 ENCOUNTER — Other Ambulatory Visit: Payer: Self-pay

## 2021-01-07 DIAGNOSIS — E7801 Familial hypercholesterolemia: Secondary | ICD-10-CM | POA: Diagnosis not present

## 2021-01-07 DIAGNOSIS — Z0000 Encounter for general adult medical examination without abnormal findings: Secondary | ICD-10-CM

## 2021-01-07 DIAGNOSIS — Z125 Encounter for screening for malignant neoplasm of prostate: Secondary | ICD-10-CM

## 2021-01-07 DIAGNOSIS — R7303 Prediabetes: Secondary | ICD-10-CM | POA: Diagnosis not present

## 2021-01-07 DIAGNOSIS — I10 Essential (primary) hypertension: Secondary | ICD-10-CM | POA: Diagnosis not present

## 2021-01-08 LAB — LIPID PANEL
Cholesterol: 290 mg/dL — ABNORMAL HIGH (ref <200)
HDL: 52 mg/dL (ref >=40)
Non-HDL Cholesterol (Calc): 238 mg/dL (calc) — ABNORMAL HIGH (ref <130)
Total CHOL/HDL Ratio: 5.6 (calc) — ABNORMAL HIGH (ref <5.0)
Triglycerides: 413 mg/dL — ABNORMAL HIGH (ref <150)

## 2021-01-08 LAB — PSA: PSA: 0.41 ng/mL (ref <=4.00)

## 2021-01-08 LAB — COMPLETE METABOLIC PANEL WITH GFR
AG Ratio: 2.3 (calc) (ref 1.0–2.5)
ALT: 37 U/L (ref 9–46)
AST: 24 U/L (ref 10–35)
Albumin: 4.3 g/dL (ref 3.6–5.1)
Alkaline phosphatase (APISO): 68 U/L (ref 35–144)
BUN: 16 mg/dL (ref 7–25)
CO2: 27 mmol/L (ref 20–32)
Calcium: 9.5 mg/dL (ref 8.6–10.3)
Chloride: 103 mmol/L (ref 98–110)
Creat: 1 mg/dL (ref 0.70–1.33)
GFR, Est African American: 96 mL/min/{1.73_m2} (ref >=60)
GFR, Est Non African American: 83 mL/min/{1.73_m2} (ref >=60)
Globulin: 1.9 g/dL (calc) (ref 1.9–3.7)
Glucose, Bld: 101 mg/dL — ABNORMAL HIGH (ref 65–99)
Potassium: 4.5 mmol/L (ref 3.5–5.3)
Sodium: 138 mmol/L (ref 135–146)
Total Bilirubin: 0.6 mg/dL (ref 0.2–1.2)
Total Protein: 6.2 g/dL (ref 6.1–8.1)

## 2021-01-08 LAB — CBC WITH DIFFERENTIAL/PLATELET
Absolute Monocytes: 656 cells/uL (ref 200–950)
Basophils Absolute: 79 cells/uL (ref 0–200)
Basophils Relative: 1 %
Eosinophils Absolute: 371 cells/uL (ref 15–500)
Eosinophils Relative: 4.7 %
HCT: 49.4 % (ref 38.5–50.0)
Hemoglobin: 15.8 g/dL (ref 13.2–17.1)
Lymphs Abs: 2544 cells/uL (ref 850–3900)
MCH: 26.8 pg — ABNORMAL LOW (ref 27.0–33.0)
MCHC: 32 g/dL (ref 32.0–36.0)
MCV: 83.7 fL (ref 80.0–100.0)
MPV: 9.6 fL (ref 7.5–12.5)
Monocytes Relative: 8.3 %
Neutro Abs: 4250 cells/uL (ref 1500–7800)
Neutrophils Relative %: 53.8 %
Platelets: 274 10*3/uL (ref 140–400)
RBC: 5.9 10*6/uL — ABNORMAL HIGH (ref 4.20–5.80)
RDW: 14.1 % (ref 11.0–15.0)
Total Lymphocyte: 32.2 %
WBC: 7.9 10*3/uL (ref 3.8–10.8)

## 2021-01-08 LAB — HEMOGLOBIN A1C
Hgb A1c MFr Bld: 6 % of total Hgb — ABNORMAL HIGH (ref <5.7)
Mean Plasma Glucose: 126 mg/dL
eAG (mmol/L): 7 mmol/L

## 2021-01-08 LAB — TSH: TSH: 5.47 mIU/L — ABNORMAL HIGH (ref 0.40–4.50)

## 2021-01-14 ENCOUNTER — Ambulatory Visit (INDEPENDENT_AMBULATORY_CARE_PROVIDER_SITE_OTHER): Payer: BC Managed Care – PPO | Admitting: Family Medicine

## 2021-01-14 ENCOUNTER — Encounter: Payer: Self-pay | Admitting: Family Medicine

## 2021-01-14 ENCOUNTER — Other Ambulatory Visit: Payer: Self-pay

## 2021-01-14 ENCOUNTER — Other Ambulatory Visit: Payer: Self-pay | Admitting: Family Medicine

## 2021-01-14 VITALS — BP 140/84 | HR 81 | Ht 68.0 in | Wt 247.4 lb

## 2021-01-14 DIAGNOSIS — I10 Essential (primary) hypertension: Secondary | ICD-10-CM

## 2021-01-14 DIAGNOSIS — T466X5A Adverse effect of antihyperlipidemic and antiarteriosclerotic drugs, initial encounter: Secondary | ICD-10-CM

## 2021-01-14 DIAGNOSIS — R7989 Other specified abnormal findings of blood chemistry: Secondary | ICD-10-CM

## 2021-01-14 DIAGNOSIS — E782 Mixed hyperlipidemia: Secondary | ICD-10-CM

## 2021-01-14 DIAGNOSIS — E538 Deficiency of other specified B group vitamins: Secondary | ICD-10-CM

## 2021-01-14 DIAGNOSIS — E559 Vitamin D deficiency, unspecified: Secondary | ICD-10-CM

## 2021-01-14 DIAGNOSIS — M791 Myalgia, unspecified site: Secondary | ICD-10-CM

## 2021-01-14 DIAGNOSIS — R7303 Prediabetes: Secondary | ICD-10-CM

## 2021-01-14 DIAGNOSIS — Z1211 Encounter for screening for malignant neoplasm of colon: Secondary | ICD-10-CM

## 2021-01-14 DIAGNOSIS — Z0000 Encounter for general adult medical examination without abnormal findings: Secondary | ICD-10-CM

## 2021-01-14 DIAGNOSIS — E781 Pure hyperglyceridemia: Secondary | ICD-10-CM

## 2021-01-14 NOTE — Assessment & Plan Note (Signed)
Controlled BP previous, mild elevated BP Currently not on medication at this time. Follow closely Modify risk factors

## 2021-01-14 NOTE — Assessment & Plan Note (Signed)
BMI >37 with morbid obesity - with co morbid PreDM, HLD, HTN Encourage lifestyle diet exercise

## 2021-01-14 NOTE — Patient Instructions (Addendum)
Thank you for coming to the office today.      DUE for FASTING BLOOD WORK (no food or drink after midnight before the lab appointment, only water or coffee without cream/sugar on the morning of)  SCHEDULE "Lab Only" visit in the morning at the clinic for lab draw in 3 MONTHS   - Make sure Lab Only appointment is at about 1 week before your next appointment, so that results will be available  For Lab Results, once available within 2-3 days of blood draw, you can can log in to MyChart online to view your results and a brief explanation. Also, we can discuss results at next follow-up visit.    Please schedule a Follow-up Appointment to: Return in about 3 months (around 04/16/2021) for 3 month fasting lab only then 1 week later Follow-up / or Virtual if prefer - Labs PreDM Fatigue.  If you have any other questions or concerns, please feel free to call the office or send a message through [facility]. You may also schedule an earlier appointment if necessary.  Additionally, you may be receiving a survey about your experience at our office within a few days to 1 week by e-mail or mail. We value your feedback.  [name], DO [place]

## 2021-01-14 NOTE — Assessment & Plan Note (Signed)
Still chronic uncontrolled cholesterol and hyperTG >400 it is improved however Not on statin Elevated ASCVD risk Concern family history of genetic hyperlipidemia/TG Multiple statin intolerance (x 3 failed)  with myalgias.  Plan: 1. Future option refer to Lipid Clinic for PCSK9 2. Encourage improved lifestyle - low carb/cholesterol, reduce portion size, start regular exercise with change to day shift

## 2021-01-14 NOTE — Assessment & Plan Note (Addendum)
Failed multiple statins in past due to myalgia Also failed zetia Declines other med options. Offer refer to [facility] in future

## 2021-01-14 NOTE — Progress Notes (Signed)
Subjective:    Patient ID: Derek Cook, male    DOB: [birth year], 60 y.o.   MRN: [id number]  Derek Cook AUMENT is a 60 y.o. male presenting on 01/14/2021 for Annual Exam   HPI   Here for Annual Physical and Lab Review  HYPERLIPIDEMIA: - Reports chronic concerns. Last lipid panel 01/2021, with elevated TG >400 previously years with TG 600-700 range so this has improved, unable to calc LDL, may need direct LDL lab No longer on rx cholesterol medication in past had failed several with side effect myalgia, and the only one that was next approved was >300$  Pre-Diabetes Reports no concerns with lifestyle still goal to improve, last A1c 6.0%, previously 5.8 to 5.9 years prior No meds Lifestyle: - Diet (Goal to limit sugars in sodas)  Denies hypoglycemia, polyuria, visual changes, numbness or tingling.  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 He had lab TSH checked as a result, it showed mild elevated 5.47. no other abnormal or results in past. - he has tried Vitamin B12 1075mcg dissolving. He admits difficulty with self induced limited sleep work schedule. Causing fatigue and tiredness. He said when ill with covid he slept regularly and slept long hours, felt better.  Health Maintenance:  PSA normal, negative.  Depression screen [facility] 2/9 01/14/2021 08/19/2020 08/06/2020  Decreased Interest 0 0 0  Down, Depressed, Hopeless 0 0 0  PHQ - 2 Score 0 0 0  Altered sleeping 0 - -  Tired, decreased energy 0 - -  Change in appetite 0 - -  Feeling bad or failure about yourself  0 - -  Trouble concentrating 0 - -  Moving slowly or fidgety/restless 0 - -  Suicidal thoughts 0 - -  PHQ-9 Score 0 - -  Difficult doing work/chores Not difficult at all - -    Past Medical History:  Diagnosis Date  . Allergy   . Asthma   . Colon polyp   . GERD (gastroesophageal reflux disease)   . Hyperlipidemia    Past Surgical History:  Procedure Laterality Date  .  CLAVICLE EXCISION    . HERNIA REPAIR  2004, 2005  . WRIST ARTHROPLASTY     Social History   Socioeconomic History  . Marital status: Married    Spouse name: Not on file  . Number of children: Not on file  . Years of education: Not on file  . Highest education level: Not on file  Occupational History  . Occupation: Portable MRI Administrator    Comment: Switching to day shifts (from nights)  Tobacco Use  . Smoking status: Former Smoker    Quit date: 09/07/2009    Years since quitting: 11.3  . Smokeless tobacco: Former Network engineer  . Vaping Use: Never used  Substance and Sexual Activity  . Alcohol use: No  . Drug use: No  . Sexual activity: Not on file  Other Topics Concern  . Not on file  Social History Narrative  . Not on file   Social Determinants of Health   Financial Resource Strain: Not on file  Food Insecurity: Not on file  Transportation Needs: Not on file  Physical Activity: Not on file  Stress: Not on file  Social Connections: Not on file  Intimate Partner Violence: Not on file   Family History  Problem Relation Age of Onset  . Cancer Mother        melanoma  . Diabetes Mother   .  Hyperlipidemia Mother   . Dementia Mother   . Cancer Father   . Cancer Sister   . Heart disease Sister   . Diabetes Brother   . Hyperlipidemia Brother   . Dementia Paternal Grandmother    Current Outpatient Medications on File Prior to Visit  Medication Sig  . albuterol (VENTOLIN HFA) 108 (90 Base) MCG/ACT inhaler Inhale 2 puffs into the lungs every 4 (four) hours as needed for wheezing or shortness of breath.  Marland Kitchen aspirin EC 81 MG tablet Take 162 mg by mouth daily.   Marland Kitchen dicyclomine (BENTYL) 10 MG capsule TAKE 1 CAPSULE BY MOUTH 4 TIMES DAILY BEFORE MEALS AND AT BEDTIME AS NEEDED ABDOMINAL PAIN CRAMPING  . fluticasone (FLONASE) 50 MCG/ACT nasal spray Place 2 sprays into both nostrils daily. Use for 4-6 weeks then stop and use seasonally or as needed.  .  Fluticasone-Salmeterol (ADVAIR DISKUS) 500-50 MCG/DOSE AEPB Inhale 1 puff into the lungs daily.  . Ipratropium-Albuterol (COMBIVENT RESPIMAT) 20-100 MCG/ACT AERS respimat Inhale 1 puff into the lungs every 6 (six) hours.  Marland Kitchen omeprazole (PRILOSEC) 40 MG capsule TAKE 1 CAPSULE BY MOUTH ONCE DAILY  . [DISCONTINUED] ipratropium (ATROVENT) 0.06 % nasal spray Place 2 sprays into both nostrils 4 (four) times daily. For up to 5-7 days then stop.   No current facility-administered medications on file prior to visit.    Review of Systems  Constitutional: Negative for activity change, appetite change, chills, diaphoresis, fatigue and fever.  HENT: Negative for congestion and hearing loss.   Eyes: Negative for visual disturbance.  Respiratory: Negative for cough, chest tightness, shortness of breath and wheezing.   Cardiovascular: Negative for chest pain, palpitations and leg swelling.  Gastrointestinal: Negative for abdominal pain, constipation, diarrhea, nausea and vomiting.  Genitourinary: Negative for dysuria, frequency and hematuria.  Musculoskeletal: Negative for arthralgias and neck pain.  Skin: Negative for rash.  Allergic/Immunologic: Negative for environmental allergies.  Neurological: Negative for dizziness, weakness, light-headedness, numbness and headaches.  Hematological: Negative for adenopathy.  Psychiatric/Behavioral: Negative for behavioral problems, dysphoric mood and sleep disturbance.   Per HPI unless specifically indicated above      Objective:    BP 140/84 (BP Location: Left Arm, Cuff Size: Normal)   Pulse 81   Ht 5\' 8"  (1.727 m)   Wt 247 lb 6.4 oz (112.2 kg)   SpO2 98%   BMI 37.62 kg/m   Wt Readings from Last 3 Encounters:  01/14/21 247 lb 6.4 oz (112.2 kg)  12/10/20 245 lb 12.8 oz (111.5 kg)  10/03/20 241 lb (109.3 kg)    Physical Exam Vitals and nursing note reviewed.  Constitutional:      General: He is not in acute distress.    Appearance: He is  well-developed. He is obese. He is not diaphoretic.     Comments: Well-appearing, comfortable, cooperative  HENT:     Head: Normocephalic and atraumatic.  Eyes:     General:        Right eye: No discharge.        Left eye: No discharge.     Conjunctiva/sclera: Conjunctivae normal.     Pupils: Pupils are equal, round, and reactive to light.  Neck:     Thyroid: No thyromegaly.  Cardiovascular:     Rate and Rhythm: Normal rate and regular rhythm.     Heart sounds: Normal heart sounds. No murmur heard.   Pulmonary:     Effort: Pulmonary effort is normal. No respiratory distress.     Breath  sounds: Normal breath sounds. No wheezing or rales.  Abdominal:     General: Bowel sounds are normal. There is no distension.     Palpations: Abdomen is soft. There is no mass.     Tenderness: There is no abdominal tenderness.  Musculoskeletal:        General: No tenderness. Normal range of motion.     Cervical back: Normal range of motion and neck supple.     Comments: Upper / Lower Extremities: - Normal muscle tone, strength bilateral upper extremities 5/5, lower extremities 5/5  Lymphadenopathy:     Cervical: No cervical adenopathy.  Skin:    General: Skin is warm and dry.     Findings: No erythema or rash.  Neurological:     Mental Status: He is alert and oriented to person, place, and time.     Comments: Distal sensation intact to light touch all extremities  Psychiatric:        Behavior: Behavior normal.     Comments: Well groomed, good eye contact, normal speech and thoughts        Results for orders placed or performed in visit on 01/07/21  TSH  Result Value Ref Range   TSH 5.47 (H) 0.40 - 4.50 mIU/L  PSA  Result Value Ref Range   PSA 0.41 < OR = 4.00 ng/mL  Lipid panel  Result Value Ref Range   Cholesterol 290 (H) <200 mg/dL   HDL 52 > OR = 40 mg/dL   Triglycerides 413 (H) <150 mg/dL   LDL Cholesterol (Calc)  mg/dL (calc)   Total CHOL/HDL Ratio 5.6 (H) <5.0 (calc)    Non-HDL Cholesterol (Calc) 238 (H) <130 mg/dL (calc)  COMPLETE METABOLIC PANEL WITH GFR  Result Value Ref Range   Glucose, Bld 101 (H) 65 - 99 mg/dL   BUN 16 7 - 25 mg/dL   Creat 1.00 0.70 - 1.33 mg/dL   GFR, Est Non African American 83 > OR = 60 mL/min/1.42m2   GFR, Est African American 96 > OR = 60 mL/min/1.34m2   BUN/Creatinine Ratio NOT APPLICABLE 6 - 22 (calc)   Sodium 138 135 - 146 mmol/L   Potassium 4.5 3.5 - 5.3 mmol/L   Chloride 103 98 - 110 mmol/L   CO2 27 20 - 32 mmol/L   Calcium 9.5 8.6 - 10.3 mg/dL   Total Protein 6.2 6.1 - 8.1 g/dL   Albumin 4.3 3.6 - 5.1 g/dL   Globulin 1.9 1.9 - 3.7 g/dL (calc)   AG Ratio 2.3 1.0 - 2.5 (calc)   Total Bilirubin 0.6 0.2 - 1.2 mg/dL   Alkaline phosphatase (APISO) 68 35 - 144 U/L   AST 24 10 - 35 U/L   ALT 37 9 - 46 U/L  CBC with Differential/Platelet  Result Value Ref Range   WBC 7.9 3.8 - 10.8 Thousand/uL   RBC 5.90 (H) 4.20 - 5.80 Million/uL   Hemoglobin 15.8 13.2 - 17.1 g/dL   HCT 49.4 38.5 - 50.0 %   MCV 83.7 80.0 - 100.0 fL   MCH 26.8 (L) 27.0 - 33.0 pg   MCHC 32.0 32.0 - 36.0 g/dL   RDW 14.1 11.0 - 15.0 %   Platelets 274 140 - 400 Thousand/uL   MPV 9.6 7.5 - 12.5 fL   Neutro Abs 4,250 1,500 - 7,800 cells/uL   Lymphs Abs 2,544 850 - 3,900 cells/uL   Absolute Monocytes 656 200 - 950 cells/uL   Eosinophils Absolute 371 15 - 500 cells/uL  Basophils Absolute 79 0 - 200 cells/uL   Neutrophils Relative % 53.8 %   Total Lymphocyte 32.2 %   Monocytes Relative 8.3 %   Eosinophils Relative 4.7 %   Basophils Relative 1.0 %  Hemoglobin A1c  Result Value Ref Range   Hgb A1c MFr Bld 6.0 (H) <5.7 % of total Hgb   Mean Plasma Glucose 126 mg/dL   eAG (mmol/L) 7.0 mmol/L      Assessment & Plan:   Problem List Items Addressed This Visit    Prediabetes    Controlled Pre-DM with A1c slight inc to 6.0 Concern with obesity, HLDPlan:    1. Not on any therapy currently  2. Encourage improved lifestyle - low carb, low sugar diet,  reduce portion size, continue improving regular exercise  Repeat A1c 3 month       Myalgia due to statin    Failed multiple statins in past due to myalgia Also failed zetia Declines other med options. Offer refer to [facility] in future      Morbid obesity ([place])    BMI >37 with morbid obesity - with co morbid PreDM, HLD, HTN Encourage lifestyle diet exercise      Hypertension    Controlled BP previous, mild elevated BP Currently not on medication at this time. Follow closely Modify risk factors      Hyperlipidemia    See A&P      Familial hypertriglyceridemia    Still chronic uncontrolled cholesterol and hyperTG >400 it is improved however Not on statin Elevated ASCVD risk Concern family history of genetic hyperlipidemia/TG Multiple statin intolerance (x 3 failed)  with myalgias.  Plan: 1. Future option refer to Lipid Clinic for PCSK9 2. Encourage improved lifestyle - low carb/cholesterol, reduce portion size, start regular exercise with change to day shift       Other Visit Diagnoses    Annual physical exam    -  Primary   Screening for colon cancer       Relevant Orders   Ambulatory referral to Gastroenterology      Updated Health Maintenance information Due for colonoscopy. Referral in to be scheduled, later in 05/2021 -10 yr from previous PSA negative Reviewed recent lab results with patient Encouraged improvement to lifestyle with diet and exercise Goal of weight loss  Additional topic Fatigue, chronic problem Poor sleep, shift work / driving truck Discussion again from previous review, likely underlying fatigue tiredness related to poor sleep However we will investigate multiple lab concerns that can also contribute to this Had mild elevated TSH 5.4 on last lab Return 3 months for labs TSH T4, Vitamin Testing, Testosterone   Orders Placed This Encounter  Procedures  . Ambulatory referral to Gastroenterology    Referral Priority:    Routine    Referral Type:   Consultation    Referral Reason:   Specialty Services Required    Number of Visits Requested:   1     No orders of the defined types were placed in this encounter.    Follow up plan: Return in about 3 months (around 04/16/2021) for 3 month fasting lab only then 1 week later Follow-up / or Virtual if prefer - Labs PreDM Fatigue.    Future lab 04/14/21 - Testosterone, TSH T4, A1c, Vitamin B12, Vitamin D  [name], DO [facility] 01/14/2021, 1:29 PM

## 2021-01-14 NOTE — Assessment & Plan Note (Signed)
See A&P 

## 2021-01-14 NOTE — Assessment & Plan Note (Signed)
Controlled Pre-DM with A1c slight inc to 6.0 Concern with obesity, HLDPlan:    1. Not on any therapy currently  2. Encourage improved lifestyle - low carb, low sugar diet, reduce portion size, continue improving regular exercise  Repeat A1c 3 month

## 2021-01-21 ENCOUNTER — Telehealth (INDEPENDENT_AMBULATORY_CARE_PROVIDER_SITE_OTHER): Payer: Self-pay | Admitting: Gastroenterology

## 2021-01-21 DIAGNOSIS — Z86010 Personal history of colonic polyps: Secondary | ICD-10-CM

## 2021-01-21 MED ORDER — NA SULFATE-K SULFATE-MG SULF 17.5-3.13-1.6 GM/177ML PO SOLN: 1.0000 | Freq: Once | ORAL | 0 refills | Status: AC

## 2021-01-21 NOTE — Progress Notes (Signed)
Gastroenterology Pre-Procedure Review  Request Date: 04/07/2021 Requesting Physician: Dr. Allen Norris   PATIENT REVIEW QUESTIONS: The patient responded to the following health history questions as indicated:    1. Are you having any GI issues? no 2. Do you have a personal history of Polyps? yes (Polyps removed 10+ years ago ) 3. Do you have a family history of Colon Cancer or Polyps? yes (brother had polyps ) 4. Diabetes Mellitus? no 5. Joint replacements in the past 12 months?no 6. Major health problems in the past 3 months?no 7. Any artificial heart valves, MVP, or defibrillator?no    MEDICATIONS & ALLERGIES:    Patient reports the following regarding taking any anticoagulation/antiplatelet therapy:   Plavix, Coumadin, Eliquis, Xarelto, Lovenox, Pradaxa, Brilinta, or Effient? no Aspirin? yes (162 mg )  Patient confirms/reports the following medications:  Current Outpatient Medications  Medication Sig Dispense Refill  . albuterol (VENTOLIN HFA) 108 (90 Base) MCG/ACT inhaler Inhale 2 puffs into the lungs every 4 (four) hours as needed for wheezing or shortness of breath. 8 g 3  . aspirin EC 81 MG tablet Take 162 mg by mouth daily.     Marland Kitchen dicyclomine (BENTYL) 10 MG capsule TAKE 1 CAPSULE BY MOUTH 4 TIMES DAILY BEFORE MEALS AND AT BEDTIME AS NEEDED ABDOMINAL PAIN CRAMPING 30 capsule 2  . fluticasone (FLONASE) 50 MCG/ACT nasal spray Place 2 sprays into both nostrils daily. Use for 4-6 weeks then stop and use seasonally or as needed. 16 g 3  . Fluticasone-Salmeterol (ADVAIR DISKUS) 500-50 MCG/DOSE AEPB Inhale 1 puff into the lungs daily. 60 each 5  . Ipratropium-Albuterol (COMBIVENT RESPIMAT) 20-100 MCG/ACT AERS respimat Inhale 1 puff into the lungs every 6 (six) hours.    Marland Kitchen omeprazole (PRILOSEC) 40 MG capsule TAKE 1 CAPSULE BY MOUTH ONCE DAILY 90 capsule 1   No current facility-administered medications for this visit.    Patient confirms/reports the following allergies:  Allergies  Allergen  Reactions  . Statins Other (See Comments)    myalgias  . Azithromycin Hives  . Shellfish-Derived Products   . Sulfa Antibiotics     No orders of the defined types were placed in this encounter.   AUTHORIZATION INFORMATION Primary Insurance: 1D#: Group #:  Secondary Insurance: 1D#: Group #:  SCHEDULE INFORMATION: Date: 04/07/2021 Time: Location: [place]

## 2021-02-08 ENCOUNTER — Other Ambulatory Visit: Payer: Self-pay | Admitting: Family Medicine

## 2021-02-08 DIAGNOSIS — R109 Unspecified abdominal pain: Secondary | ICD-10-CM

## 2021-02-08 DIAGNOSIS — R197 Diarrhea, unspecified: Secondary | ICD-10-CM

## 2021-02-08 NOTE — Telephone Encounter (Signed)
Requested Prescriptions  Pending Prescriptions Disp Refills  . dicyclomine (BENTYL) 10 MG capsule [Pharmacy Med Name: DICYCLOMINE HCL 10 MG CAP] 30 capsule 2    Sig: TAKE 1 CAPSULE BY MOUTH 4 TIMES DAILY BEFORE MEALS AND AT BEDTIME AS NEEDED ABDOMINAL PAIN CRAMPING     Gastroenterology:  Antispasmodic Agents Passed - 02/08/2021  4:19 PM      Passed - Last Heart Rate in normal range    Pulse Readings from Last 1 Encounters:  01/14/21 81         Passed - Valid encounter within last 12 months    Recent Outpatient Visits          3 weeks ago Annual physical exam   [place], DO   2 months ago Seasonal allergic rhinitis due to other allergic trigger   [place], DO   4 months ago Diarrhea, unspecified type   [place], DO   5 months ago Pneumonia due to COVID-19 virus   [place], DO   6 months ago Pneumonia due to COVID-19 virus   [place], DO      Future Appointments            In 2 months [place] [facility], Devonne Doughty, [facility], [facility]

## 2021-03-10 ENCOUNTER — Ambulatory Visit
Admission: EM | Admit: 2021-03-10 | Discharge: 2021-03-10 | Disposition: A | Discharge status: Home / Self Care | Payer: BC Managed Care – PPO | Attending: Emergency Medicine | Admitting: Emergency Medicine

## 2021-03-10 ENCOUNTER — Other Ambulatory Visit: Payer: Self-pay

## 2021-03-10 DIAGNOSIS — U071 COVID-19: Secondary | ICD-10-CM | POA: Diagnosis not present

## 2021-03-10 MED ORDER — NIRMATRELVIR/RITONAVIR (PAXLOVID)TABLET: 3.0000 | ORAL_TABLET | Freq: Two times a day (BID) | ORAL | 0 refills | Status: AC

## 2021-03-10 MED ORDER — BENZONATATE 100 MG PO CAPS: 200.0000 mg | ORAL_CAPSULE | Freq: Three times a day (TID) | ORAL | 0 refills | Status: DC

## 2021-03-10 MED ORDER — PROMETHAZINE-DM 6.25-15 MG/5ML PO SYRP: 5.0000 mL | ORAL_SOLUTION | Freq: Four times a day (QID) | ORAL | 0 refills | Status: DC | PRN

## 2021-03-10 MED ORDER — IPRATROPIUM BROMIDE 0.06 % NA SOLN: 2.0000 | Freq: Four times a day (QID) | NASAL | 12 refills | Status: DC

## 2021-03-10 NOTE — ED Provider Notes (Signed)
[facility]    CSN: [id number] Arrival date & time: 03/10/21  0914      History   Chief Complaint Chief Complaint  Patient presents with   Covid Positive    HPI Derek Cook is a 60 y.o. male.   HPI  60 year old male here requesting antiviral therapy after testing COVID-positive at home last night.  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  His symptoms intensified 4 days ago to include sweats and chills, nasal congestion, headache, productive cough for yellow sputum, and wheezing.  He states that he took an at home COVID test last night and tested positive.  He denies fever, nasal discharge, ear pain or pressure, or GI complaints.  Past Medical History:  Diagnosis Date   Allergy    Asthma    Colon polyp    GERD (gastroesophageal reflux disease)    Hyperlipidemia     Patient Active Problem List   Diagnosis Date Noted   Morbid obesity ([place]) 01/14/2021   Elevated liver transaminase level 07/11/2020   Hyponatremia 07/11/2020   SOB (shortness of breath) 06/25/2020   Myalgia due to statin 12/03/2017   Chronic low back pain 06/21/2017   Multiple nevi 06/21/2017   Familial hypertriglyceridemia 04/06/2017   Plantar fasciitis of right foot 04/05/2017   Allergic rhinitis due to allergen 09/17/2016   Dry skin dermatitis 09/09/2016   Carpal tunnel syndrome, left 06/25/2016   GERD (gastroesophageal reflux disease) 05/29/2016   Prediabetes 03/24/2016   Mild persistent asthma 02/13/2016   Hyperlipidemia 02/13/2016   Hypertension 02/13/2016    Past Surgical History:  Procedure Laterality Date   CLAVICLE EXCISION     HERNIA REPAIR  2004, 2005   WRIST ARTHROPLASTY         Home Medications    Prior to Admission medications   Medication Sig Start Date End Date Taking? Authorizing Provider  albuterol (VENTOLIN HFA) 108 (90 Base) MCG/ACT inhaler Inhale 2 puffs into the lungs every 4 (four)  hours as needed for wheezing or shortness of breath. 08/19/20  Yes Karamalegos, Devonne Doughty, DO  aspirin EC 81 MG tablet Take 162 mg by mouth daily.    Yes [provider]  benzonatate (TESSALON) 100 MG capsule Take 2 capsules (200 mg total) by mouth every 8 (eight) hours. 03/10/21  Yes Margarette Canada, NP  dicyclomine (BENTYL) 10 MG capsule TAKE 1 CAPSULE BY MOUTH 4 TIMES DAILY BEFORE MEALS AND AT BEDTIME AS NEEDED ABDOMINAL PAIN CRAMPING 02/08/21  Yes Karamalegos, Devonne Doughty, DO  Fluticasone-Salmeterol (ADVAIR DISKUS) 500-50 MCG/DOSE AEPB Inhale 1 puff into the lungs daily. 08/22/20  Yes Karamalegos, Alexander J, DO  ipratropium (ATROVENT) 0.06 % nasal spray Place 2 sprays into both nostrils 4 (four) times daily. 03/10/21  Yes Margarette Canada, NP  Ipratropium-Albuterol (COMBIVENT RESPIMAT) 20-100 MCG/ACT AERS respimat Inhale 1 puff into the lungs every 6 (six) hours.   Yes [provider]  nirmatrelvir/ritonavir EUA (PAXLOVID) TABS Take 3 tablets by mouth 2 (two) times daily for 5 days. Patient GFR is 95. Take nirmatrelvir (150 mg) two tablets twice daily for 5 days and ritonavir (100 mg) one tablet twice daily for 5 days. 03/10/21 03/15/21 Yes Margarette Canada, NP  omeprazole (PRILOSEC) 40 MG capsule TAKE 1 CAPSULE BY MOUTH ONCE DAILY 11/22/20  Yes Karamalegos, Devonne Doughty, DO  promethazine-dextromethorphan (PROMETHAZINE-DM) 6.25-15 MG/5ML syrup Take 5 mLs by mouth 4 (four) times daily as needed. 03/10/21  Yes Margarette Canada, NP  fluticasone (FLONASE) 50 MCG/ACT nasal spray Place 2 sprays into both nostrils daily. Use for 4-6 weeks then stop and use seasonally or as needed. Patient not taking: Reported on 01/21/2021 12/10/20   Olin Hauser, DO    Family History Family History  Problem Relation Age of Onset   Cancer Mother        melanoma   Diabetes Mother    Hyperlipidemia Mother    Dementia Mother    Cancer Father    Cancer Sister    Heart disease Sister    Diabetes Brother     Hyperlipidemia Brother    Dementia Paternal Grandmother     Social History Social History   Tobacco Use   Smoking status: Former    Pack years: 0.00    Types: Cigarettes    Quit date: 09/07/2009    Years since quitting: 11.5   Smokeless tobacco: Former  Scientific laboratory technician Use: Never used  Substance Use Topics   Alcohol use: No   Drug use: No     Allergies   Statins, Azithromycin, Shellfish-derived products, and Sulfa antibiotics   Review of Systems Review of Systems  Constitutional:  Positive for chills and diaphoresis. Negative for activity change, appetite change and fever.  HENT:  Positive for congestion. Negative for ear pain and rhinorrhea.   Respiratory:  Positive for cough and wheezing. Negative for shortness of breath.   Gastrointestinal:  Negative for diarrhea, nausea and vomiting.  Skin:  Negative for rash.  Neurological:  Positive for headaches.  Hematological: Negative.   Psychiatric/Behavioral: Negative.      Physical Exam Triage Vital Signs ED Triage Vitals  Enc Vitals Group     BP 03/10/21 0939 (!) 123/92     Pulse Rate 03/10/21 0939 (!) 111     Resp 03/10/21 0939 18     Temp 03/10/21 0939 97.8 F (36.6 C)     Temp Source 03/10/21 0939 Oral     SpO2 03/10/21 0939 97 %     Weight 03/10/21 0936 245 lb (111.1 kg)     Height 03/10/21 0936 5\' 8"  (1.727 m)     Head Circumference --      Peak Flow --      Pain Score 03/10/21 0936 0     Pain Loc --      Pain Edu? --      Excl. in [place]? --    No data found.  Updated Vital Signs BP (!) 123/92 (BP Location: Left Arm)   Pulse (!) 111   Temp 97.8 F (36.6 C) (Oral)   Resp 18   Ht 5\' 8"  (1.727 m)   Wt 245 lb (111.1 kg)   SpO2 97%   BMI 37.25 kg/m   Visual Acuity Right Eye Distance:   Left Eye Distance:   Bilateral Distance:    Right Eye Near:   Left Eye Near:    Bilateral Near:     Physical Exam Vitals and nursing note reviewed.  Constitutional:      Appearance: Normal appearance. He  is obese. He is not ill-appearing.  HENT:     Head: Normocephalic and atraumatic.     Right Ear: Tympanic membrane, ear canal and external ear normal. There is no impacted cerumen.     Left Ear: Tympanic membrane, ear canal and external ear normal. There is no impacted cerumen.     Nose: Congestion and rhinorrhea present.  Mouth/Throat:     Mouth: Mucous membranes are moist.     Pharynx: Oropharynx is clear. Posterior oropharyngeal erythema present.  Cardiovascular:     Rate and Rhythm: Normal rate and regular rhythm.     Pulses: Normal pulses.     Heart sounds: Normal heart sounds. No murmur heard.   No gallop.  Pulmonary:     Effort: Pulmonary effort is normal.     Breath sounds: Normal breath sounds. No wheezing, rhonchi or rales.  Musculoskeletal:     Cervical back: Normal range of motion and neck supple.  Skin:    General: Skin is warm and dry.     Capillary Refill: Capillary refill takes less than 2 seconds.     Findings: No erythema or rash.  Neurological:     General: No focal deficit present.     Mental Status: He is alert and oriented to person, place, and time.  Psychiatric:        Mood and Affect: Mood normal.        Behavior: Behavior normal.        Thought Content: Thought content normal.        Judgment: Judgment normal.     UC Treatments / Results  Labs (all labs ordered are listed, but only abnormal results are displayed) Labs Reviewed  SARS CORONAVIRUS 2 (TAT 6-24 HRS)    EKG   Radiology No results found.  Procedures Procedures (including critical care time)  Medications Ordered in UC Medications - No data to display  Initial Impression / Assessment and Plan / UC Course  I have reviewed the triage vital signs and the nursing notes.  Pertinent labs & imaging results that were available during my care of the patient were reviewed by me and considered in my medical decision making (see chart for details).  Patient is an obese but  nontoxic-appearing 60 year old male here for evaluation of COVID symptoms who tested positive at home for COVID yesterday as outlined in HPI above.  Patient's physical exam reveals pearly gray tympanic membranes bilaterally with a normal light reflex and clear external auditory canals.  Nasal mucosa is erythematous and edematous with clear nasal discharge.  Oropharyngeal exam reveals posterior oropharyngeal erythema with clear postnasal drip.  Cardiopulmonary exam is benign.  Patient is requesting PCR confirmation test which was collected and sent off.  We will treat patient for COVID-19 with Paxlovid twice daily for 5 days, Atrovent nasal spray for nasal congestion and postnasal drip, Tessalon Perles and Promethazine DM for cough and congestion.  ER precautions reviewed with patient.  Patient had recent blood work on Jan 07, 2021 which showed a GFR of 83.   Final Clinical Impressions(s) / UC Diagnoses   Final diagnoses:  [id number]     Discharge Instructions      You will have to quarantine for 5 days from the start of your symptoms.  After 5 days you can break quarantine if your symptoms have improved and you have not had a fever for 24 hours without taking Tylenol or ibuprofen.  Use over-the-counter Tylenol and ibuprofen as needed for body aches and fever.  Use the Tessalon Perles during the day as needed for cough and the Promethazine DM cough syrup at nighttime as will make you drowsy.  Take the Paxlovid twice daily for 5 days for COVID-19.  Use the Atrovent nasal spray 4 times a day as needed for nasal congestion and postnasal drip.  If you develop any increased shortness of  breath-especially at rest, you are unable to speak in full sentences, or is a late sign your lips are turning blue you need to go the ER for evaluation.      ED Prescriptions     Medication Sig Dispense Auth. Provider   nirmatrelvir/ritonavir EUA (PAXLOVID) TABS Take 3 tablets by mouth 2 (two) times daily for 5  days. Patient GFR is 95. Take nirmatrelvir (150 mg) two tablets twice daily for 5 days and ritonavir (100 mg) one tablet twice daily for 5 days. 30 tablet Margarette Canada, NP   benzonatate (TESSALON) 100 MG capsule Take 2 capsules (200 mg total) by mouth every 8 (eight) hours. 21 capsule Margarette Canada, NP   ipratropium (ATROVENT) 0.06 % nasal spray Place 2 sprays into both nostrils 4 (four) times daily. 15 mL Margarette Canada, NP   promethazine-dextromethorphan (PROMETHAZINE-DM) 6.25-15 MG/5ML syrup Take 5 mLs by mouth 4 (four) times daily as needed. 118 mL Margarette Canada, NP      PDMP not reviewed this encounter.   Margarette Canada, NP 03/10/21 [phone]

## 2021-03-10 NOTE — Discharge Instructions (Addendum)
You will have to quarantine for 5 days from the start of your symptoms.  After 5 days you can break quarantine if your symptoms have improved and you have not had a fever for 24 hours without taking Tylenol or ibuprofen.  Use over-the-counter Tylenol and ibuprofen as needed for body aches and fever.  Use the Tessalon Perles during the day as needed for cough and the Promethazine DM cough syrup at nighttime as will make you drowsy.  Take the Paxlovid twice daily for 5 days for COVID-19.  Use the Atrovent nasal spray 4 times a day as needed for nasal congestion and postnasal drip.  If you develop any increased shortness of breath-especially at rest, you are unable to speak in full sentences, or is a late sign your lips are turning blue you need to go the ER for evaluation.

## 2021-03-10 NOTE — ED Triage Notes (Signed)
Pt c/o sweats, chills, sinus congestion, headache and cough since late last week. Pt took an at-home COVID test last night and it was positive. Pt would also like a PCR test.

## 2021-03-11 ENCOUNTER — Telehealth: Payer: Self-pay

## 2021-03-11 LAB — SARS CORONAVIRUS 2 (TAT 6-24 HRS): SARS Coronavirus 2: NEGATIVE

## 2021-03-11 NOTE — Telephone Encounter (Signed)
Copied from [place] [id number]. Topic: Quick Communication - Rx Refill/Question >> Mar 11, 2021  1:39 PM Erick Blinks wrote: Pt called requesting to speak to nurse regarding medication questions nirmatrelvir/ritonavir EUA (PAXLOVID) TABS Best contact: [phone]     ** I called the number given to call and got no answer.. I left a message letting him know that a virtual appt would be necessary for [place] to prescribe any medicine.

## 2021-03-11 NOTE — Telephone Encounter (Signed)
Correction** Left message but a virtual is not needed.

## 2021-04-07 ENCOUNTER — Ambulatory Visit: Admit: 2021-04-07 | Payer: BC Managed Care – PPO | Admitting: Gastroenterology

## 2021-04-07 SURGERY — COLONOSCOPY WITH PROPOFOL
Anesthesia: General

## 2021-04-14 ENCOUNTER — Other Ambulatory Visit: Payer: BC Managed Care – PPO

## 2021-04-21 ENCOUNTER — Telehealth: Payer: BC Managed Care – PPO | Admitting: Family Medicine

## 2021-06-06 ENCOUNTER — Other Ambulatory Visit: Payer: Self-pay | Admitting: Family Medicine

## 2021-06-06 DIAGNOSIS — J4530 Mild persistent asthma, uncomplicated: Secondary | ICD-10-CM

## 2021-06-09 ENCOUNTER — Ambulatory Visit
Admission: EM | Admit: 2021-06-09 | Discharge: 2021-06-09 | Disposition: A | Discharge status: Home / Self Care | Payer: BC Managed Care – PPO | Attending: Family Medicine | Admitting: Family Medicine

## 2021-06-09 ENCOUNTER — Other Ambulatory Visit: Payer: Self-pay

## 2021-06-09 ENCOUNTER — Ambulatory Visit: Payer: Self-pay

## 2021-06-09 ENCOUNTER — Telehealth: Payer: Self-pay

## 2021-06-09 DIAGNOSIS — R1031 Right lower quadrant pain: Secondary | ICD-10-CM

## 2021-06-09 DIAGNOSIS — R1032 Left lower quadrant pain: Secondary | ICD-10-CM

## 2021-06-09 MED ORDER — MELOXICAM 15 MG PO TABS: 15.0000 mg | ORAL_TABLET | Freq: Every day | ORAL | 0 refills | Status: DC | PRN

## 2021-06-09 NOTE — ED Provider Notes (Signed)
[facility]    CSN: [id number] Arrival date & time: 06/09/21  1110      History   Chief Complaint Groin Pain  HPI  60 year old male presents with the above complaint.  Patient has a history of inguinal hernia bilaterally which was repaired years ago.  Patient reports that he has recently developed pain in the inguinal regions bilaterally.  States that this occurred on Saturday and was quite severe.  Has now subsided.  He has minimal to no pain currently.  He called his primary care physician and was advised to come in for evaluation.  He states that he is recently had some dry heaving.  He is unsure if this is related.  No fever.  No relieving factors.  No other complaints concerns at this time.  Past Medical History:  Diagnosis Date   Allergy    Asthma    Colon polyp    GERD (gastroesophageal reflux disease)    Hyperlipidemia     Patient Active Problem List   Diagnosis Date Noted   Morbid obesity ([place]) 01/14/2021   Elevated liver transaminase level 07/11/2020   Hyponatremia 07/11/2020   SOB (shortness of breath) 06/25/2020   Myalgia due to statin 12/03/2017   Chronic low back pain 06/21/2017   Multiple nevi 06/21/2017   Familial hypertriglyceridemia 04/06/2017   Plantar fasciitis of right foot 04/05/2017   Allergic rhinitis due to allergen 09/17/2016   Dry skin dermatitis 09/09/2016   Carpal tunnel syndrome, left 06/25/2016   GERD (gastroesophageal reflux disease) 05/29/2016   Prediabetes 03/24/2016   Mild persistent asthma 02/13/2016   Hyperlipidemia 02/13/2016   Hypertension 02/13/2016    Past Surgical History:  Procedure Laterality Date   CLAVICLE EXCISION     HERNIA REPAIR  2004, 2005   WRIST ARTHROPLASTY         Home Medications    Prior to Admission medications   Medication Sig Start Date End Date Taking? Authorizing Provider  meloxicam (MOBIC) 15 MG tablet Take 1 tablet (15 mg total) by mouth daily as needed for pain. 06/09/21  Yes  Aizah Gehlhausen G, DO  albuterol (VENTOLIN HFA) 108 (90 Base) MCG/ACT inhaler INHALE 2 PUFFS BY MOUTH EVERY 4 HOURS ASNEEDED WHEEZING/ SHORTNESS OF BREATH 06/06/21   Karamalegos, Devonne Doughty, DO  aspirin EC 81 MG tablet Take 162 mg by mouth daily.     [provider]  benzonatate (TESSALON) 100 MG capsule Take 2 capsules (200 mg total) by mouth every 8 (eight) hours. 03/10/21   Margarette Canada, NP  dicyclomine (BENTYL) 10 MG capsule TAKE 1 CAPSULE BY MOUTH 4 TIMES DAILY BEFORE MEALS AND AT BEDTIME AS NEEDED ABDOMINAL PAIN CRAMPING 02/08/21   Parks Ranger, Devonne Doughty, DO  fluticasone (FLONASE) 50 MCG/ACT nasal spray Place 2 sprays into both nostrils daily. Use for 4-6 weeks then stop and use seasonally or as needed. Patient not taking: Reported on 01/21/2021 12/10/20   Olin Hauser, DO  Fluticasone-Salmeterol (ADVAIR DISKUS) 500-50 MCG/DOSE AEPB Inhale 1 puff into the lungs daily. 08/22/20   Karamalegos, Devonne Doughty, DO  ipratropium (ATROVENT) 0.06 % nasal spray Place 2 sprays into both nostrils 4 (four) times daily. 03/10/21   Margarette Canada, NP  Ipratropium-Albuterol (COMBIVENT RESPIMAT) 20-100 MCG/ACT AERS respimat Inhale 1 puff into the lungs every 6 (six) hours.    [provider]  omeprazole (PRILOSEC) 40 MG capsule TAKE 1 CAPSULE BY MOUTH ONCE DAILY 11/22/20   Olin Hauser, DO  promethazine-dextromethorphan (PROMETHAZINE-DM) 6.25-15 MG/5ML syrup  Take 5 mLs by mouth 4 (four) times daily as needed. 03/10/21   Margarette Canada, NP    Family History Family History  Problem Relation Age of Onset   Cancer Mother        melanoma   Diabetes Mother    Hyperlipidemia Mother    Dementia Mother    Cancer Father    Cancer Sister    Heart disease Sister    Diabetes Brother    Hyperlipidemia Brother    Dementia Paternal Grandmother     Social History Social History   Tobacco Use   Smoking status: Former    Types: Cigarettes    Quit date: 09/07/2009    Years since quitting:  11.7   Smokeless tobacco: Former  Scientific laboratory technician Use: Never used  Substance Use Topics   Alcohol use: No   Drug use: No     Allergies   Statins, Azithromycin, Shellfish-derived products, and Sulfa antibiotics   Review of Systems Review of Systems Per HPI  Physical Exam Triage Vital Signs ED Triage Vitals  Enc Vitals Group     BP 06/09/21 1137 131/89     Pulse Rate 06/09/21 1137 83     Resp 06/09/21 1137 18     Temp 06/09/21 1137 98.5 F (36.9 C)     Temp Source 06/09/21 1137 Oral     SpO2 06/09/21 1137 97 %     Weight --      Height --      Head Circumference --      Peak Flow --      Pain Score 06/09/21 1155 1     Pain Loc --      Pain Edu? --      Excl. in [place]? --    No data found.  Updated Vital Signs BP 131/89 (BP Location: Left Arm)   Pulse 83   Temp 98.5 F (36.9 C) (Oral)   Resp 18   SpO2 97%   Visual Acuity Right Eye Distance:   Left Eye Distance:   Bilateral Distance:    Right Eye Near:   Left Eye Near:    Bilateral Near:     Physical Exam Constitutional:      General: He is not in acute distress.    Appearance: Normal appearance. He is obese. He is not ill-appearing.  HENT:     Head: Normocephalic and atraumatic.  Eyes:     General:        Right eye: No discharge.        Left eye: No discharge.     Conjunctiva/sclera: Conjunctivae normal.  Pulmonary:     Effort: Pulmonary effort is normal. No respiratory distress.  Genitourinary:    Comments: Fullness in the inguinal area bilaterally.  There is no palpable hernia on exam.  No scrotal mass.  No discrete areas of tenderness. Neurological:     Mental Status: He is alert.  Psychiatric:        Mood and Affect: Mood normal.        Behavior: Behavior normal.     UC Treatments / Results  Labs (all labs ordered are listed, but only abnormal results are displayed) Labs Reviewed - No data to display  EKG   Radiology No results found.  Procedures Procedures (including  critical care time)  Medications Ordered in UC Medications - No data to display  Initial Impression / Assessment and Plan / UC Course  I have reviewed the  triage vital signs and the nursing notes.  Pertinent labs & imaging results that were available during my care of the patient were reviewed by me and considered in my medical decision making (see chart for details).    60 year old male presents with bilateral inguinal pain. History of hernia s/p repair bilaterally.  I cannot appreciate discrete inguinal hernia on either side on exam.  Meloxicam as directed.  Referral placed to general surgery for further evaluation and possible imaging.  Work note given.  Supportive care.  Final Clinical Impressions(s) / UC Diagnoses   Final diagnoses:  Inguinal pain of both sides     Discharge Instructions      Rest.   No heavy lifting.  I have placed a referral to general surgery.  They will call.  Take care  Dr. Lacinda Axon    ED Prescriptions     Medication Sig Dispense Auth. Provider   meloxicam (MOBIC) 15 MG tablet Take 1 tablet (15 mg total) by mouth daily as needed for pain. 30 tablet Coral Spikes, DO      PDMP not reviewed this encounter.   Coral Spikes, [place] 06/09/21 1647

## 2021-06-09 NOTE — Telephone Encounter (Signed)
Pt c/o sudden onset of pain on Saturday that was severe and pain is now constant and is a 5 out of 10 pain. Lower abdominal [pain is worse with standing. Pt describes pain as a pulling sensation to the lower abdomen and groin. Pt stated that his abdomen is bloated, but stated that is normal.Pt c/o lower back and flank pain. Pt thinks pain is a ruptured hernia. Care advice given and pt verbalized understanding. No openings at office so advised pt to go to UC. Pt will go.       Reason for Disposition  [1] MILD-MODERATE pain AND [2] constant AND [3] present > 2 hours  Answer Assessment - Initial Assessment Questions 1. LOCATION: "Where does it hurt?"      Lower abdomen 2. RADIATION: "Does the pain shoot anywhere else?" (e.g., chest, back)     groin 3. ONSET: "When did the pain begin?" (Minutes, hours or days ago)      Saturday 4. SUDDEN: "Gradual or sudden onset?"     sudden 5. PATTERN "Does the pain come and go, or is it constant?"    - If constant: "Is it getting better, staying the same, or worsening?"      (Note: Constant means the pain never goes away completely; most serious pain is constant and it progresses)     - If intermittent: "How long does it last?" "Do you have pain now?"     (Note: Intermittent means the pain goes away completely between bouts)     constant 6. SEVERITY: "How bad is the pain?"  (e.g., Scale 1-10; mild, moderate, or severe)    - MILD (1-3): doesn't interfere with normal activities, abdomen soft and not tender to touch     - MODERATE (4-7): interferes with normal activities or awakens from sleep, abdomen tender to touch     - SEVERE (8-10): excruciating pain, doubled over, unable to do any normal activities       5/10 7. RECURRENT SYMPTOM: "Have you ever had this type of stomach pain before?" If Yes, ask: "When was the last time?" and "What happened that time?"      Yes 2005- surgery inguinal 8. CAUSE: "What do you think is causing the stomach pain?"      hernia 9. RELIEVING/AGGRAVATING FACTORS: "What makes it better or worse?" (e.g., movement, antacids, bowel movement)     exhaustion 10. OTHER SYMPTOMS: "Do you have any other symptoms?" (e.g., back pain, diarrhea, fever, urination pain, vomiting)       Exhaustion,lower back pain Flank pain, vomits comes from being nervous, bloating, diarrhea (watery and loose)  Protocols used: Abdominal Pain - Male-A-AH

## 2021-06-09 NOTE — Discharge Instructions (Signed)
Rest.   No heavy lifting.  I have placed a referral to general surgery.  They will call.  Take care  Dr. Lacinda Axon

## 2021-06-09 NOTE — Telephone Encounter (Signed)
Copied from [place] [id number]. Topic: General - Inquiry >> Jun 09, 2021  2:40 PM Loma Boston wrote: Pt states he thinks he has a hernia and went to [facility] urgent care and states they have made an appt for him with a specialist. He wants Dr Raliegh Ip to order a CT scan for him to make sure he has a hernia but he could not tell me were the hernia was or any information about the visit. PT was told that appt would probably be needed for Dr K to discuss if he was to order CT but pt  states has appt with specialist he does not need another appt, the drs can work it out.. Pt wants a call back. [phone]     I called the patient and left a message letting him know that a CT is not typically needed when diagnosing a hernia. Therefore, there would be nothing for Dr. Raliegh Ip to do. The general surgeon would be the one to proceed with the care of a hernia. I explained that there is nothing for the doctors to, "work out" and I strongly suggested he keep his appt with them on the 10th.

## 2021-06-09 NOTE — ED Triage Notes (Signed)
Pt presents with sudden onset lower abdominal pain and back pain that radiates down into his scrotum and groin. Concerned for hernia rupture. Dr. Lacinda Axon notified of patient.

## 2021-06-11 ENCOUNTER — Ambulatory Visit: Payer: Self-pay | Admitting: Surgery

## 2021-06-13 ENCOUNTER — Other Ambulatory Visit: Payer: Self-pay

## 2021-06-13 ENCOUNTER — Ambulatory Visit: Payer: BC Managed Care – PPO | Admitting: Surgery

## 2021-06-13 ENCOUNTER — Encounter: Payer: Self-pay | Admitting: Surgery

## 2021-06-13 VITALS — BP 152/92 | HR 83 | Temp 98.3°F | Ht 68.0 in | Wt 245.0 lb

## 2021-06-13 DIAGNOSIS — R1031 Right lower quadrant pain: Secondary | ICD-10-CM | POA: Diagnosis not present

## 2021-06-13 DIAGNOSIS — R1032 Left lower quadrant pain: Secondary | ICD-10-CM

## 2021-06-13 NOTE — Patient Instructions (Signed)
We did not find any signs of a hernia recurrence on exam.  Follow-up with our office as needed.  Give your problem another month to improve. If it does not, call us and we will get you set up for a Cat Scan.   Please call and ask to speak with a nurse if you develop questions or concerns.   Adductor Muscle Strain An adductor muscle strain, also called a groin strain or pull, is an injury to the muscles or tendons on the upper, inner part of the thigh. These muscles are called the adductor muscles or groin muscles. They are responsible for moving the legs across the body or pulling the legs together. A muscle strain occurs when a muscle is overstretched and some muscle fibers are torn. An adductor muscle strain can range from mild to severe, depending on how many muscle fibers are affected and whether the muscle fibers are partially or completely torn. What are the causes? Adductor muscle strains usually occur during exercise or while participating in sports. The injury often happens when a sudden, violent force is placed on a muscle, stretching the muscle too far. A strain is more likely to happen when your muscles are not warmed up or if you are not properly conditioned. This injury may be caused by: Stretching the adductor muscles too far or too suddenly, often during side-to-side motion with a sudden change in direction. Putting repeated stress on the adductor muscles over a long period of time. Performing vigorous activity without properly stretching the adductor muscles beforehand. What are the signs or symptoms? Symptoms of this condition include: Pain and tenderness in the groin area. This begins as sharp pain and persists as a dull ache. A popping or snapping feeling when the injury occurs (for severe strains). Swelling or bruising. Muscle spasms. Weakness in the leg. Stiffness in the groin area with decreased ability to move the affected muscles. How is this diagnosed? This  condition may be diagnosed based on: Your symptoms and a description of how the injury occurred. A physical exam. Imaging tests, such as: X-rays. These are sometimes needed to rule out a broken bone or cartilage problems. An ultrasound, CT scan, or MRI. These may be done if your health care provider suspects a complete muscle tear or needs to check for other injuries. How is this treated? An adductor strain will often heal on its own. If needed, this condition may be treated with: PRICE therapy. PRICE stands for protection of the injured area, rest, ice, pressure (compression), and elevation. Medicines to help manage pain and swelling (anti-inflammatory medicines). Crutches. You may be directed to use these for the first few days to minimize your pain. Depending on the severity of the muscle strain, recovery time may vary from a few weeks to several months. Severe injuries often require 4-6 weeks for recovery. In those cases, complete healing can take 4-5 months. Follow these instructions at home: Dana the muscle from being injured again. Rest. Do not use the strained muscle if it causes pain. If directed, put ice on the injured area: Put ice in a plastic bag. Place a towel between your skin and the bag. Leave the ice on for 20 minutes, 2-3 times a day. Do this for the first 2 days after the injury. Apply compression by wrapping the injured area with an elastic bandage as told by your health care provider. Raise (elevate) the injured area above the level of your heart while you are sitting  or lying down. General instructions Take over-the-counter and prescription medicines only as told by your health care provider. Walk, stretch, and do exercises as told by your health care provider. Only do these activities if you can do so without any pain. Follow your treatment plan as told by your health care provider. This may include: Physical therapy. Massage. Local electrical  stimulation (transcutaneous electrical nerve stimulation, TENS). How is this prevented? Warm up and stretch before being active. Cool down and stretch after being active. Give your body time to rest between periods of activity. Make sure to use equipment that fits you. Be safe and responsible while being active to avoid slips and falls. Maintain physical fitness, including: Proper conditioning in the adductor muscles. Overall strength, flexibility, and endurance. Contact a health care provider if: You have increased pain or swelling in the affected area. Your symptoms are not improving or they are getting worse. Summary An adductor muscle strain, also called a groin strain or pull, is an injury to the muscles or tendons on the upper, inner part of the thigh. A muscle strain occurs when a muscle is overstretched and some muscle fibers are torn. Depending on the severity of the muscle strain, recovery time may vary from a few weeks to several months. This information is not intended to replace advice given to you by your health care provider. Make sure you discuss any questions you have with your health care provider. Document Revised: 12/13/2018 Document Reviewed: 01/24/2018 Elsevier Patient Education  [place].

## 2021-06-15 ENCOUNTER — Encounter: Payer: Self-pay | Admitting: Surgery

## 2021-06-15 NOTE — Progress Notes (Signed)
06/13/2021  Reason for Visit: Bilateral inguinal pain  History of Present Illness: Derek Cook is a 60 y.o. male presenting for evaluation of bilateral inguinal pain.  Patient has a history of open bilateral inguinal hernia repairs in 2004 and 2005.  He had otherwise been doing well but reports that starting on 06/07/2021, he started having more severe pain particular in the left side.  He reports that he has had some issues in the past with heavy coughing and dry heaving which once resulted in similar discomfort which went away quicker.  However this time around his pain had persisted longer.  He presented to urgent care on 06/09/2021 and at the time of his evaluation the pain had resolved but still felt some discomfort.  No imaging was done at the time he was referred to [place] for further evaluation.  Patient denies any fevers, chills, chest pain, shortness of breath.  Patient does report being more active.  Past Medical History: Past Medical History:  Diagnosis Date   Allergy    Asthma    Colon polyp    GERD (gastroesophageal reflux disease)    Hyperlipidemia      Past Surgical History: Past Surgical History:  Procedure Laterality Date   CLAVICLE EXCISION     HERNIA REPAIR Bilateral 2004, 2005   Inguinal repair with mesh   WRIST ARTHROPLASTY      Home Medications: Prior to Admission medications   Medication Sig Start Date End Date Taking? Authorizing Provider  albuterol (VENTOLIN HFA) 108 (90 Base) MCG/ACT inhaler INHALE 2 PUFFS BY MOUTH EVERY 4 HOURS ASNEEDED WHEEZING/ SHORTNESS OF BREATH 06/06/21  Yes Karamalegos, Devonne Doughty, DO  aspirin EC 81 MG tablet Take 162 mg by mouth daily.    Yes [provider]  Fluticasone-Salmeterol (ADVAIR DISKUS) 500-50 MCG/DOSE AEPB Inhale 1 puff into the lungs daily. 08/22/20  Yes Karamalegos, Devonne Doughty, DO  omeprazole (PRILOSEC) 40 MG capsule TAKE 1 CAPSULE BY MOUTH ONCE DAILY 11/22/20  Yes Karamalegos, Devonne Doughty, DO  meloxicam (MOBIC)  15 MG tablet Take 1 tablet (15 mg total) by mouth daily as needed for pain. 06/09/21   Coral Spikes, DO    Allergies: Allergies  Allergen Reactions   Statins Other (See Comments)    myalgias   Azithromycin Hives   Shellfish-Derived Products    Sulfa Antibiotics     Social History:  reports that he quit smoking about 11 years ago. His smoking use included cigarettes. He has quit using smokeless tobacco. He reports that he does not drink alcohol and does not use drugs.   Family History: Family History  Problem Relation Age of Onset   Cancer Mother        melanoma   Diabetes Mother    Hyperlipidemia Mother    Dementia Mother    Cancer Father    Cancer Sister    Heart disease Sister    Diabetes Brother    Hyperlipidemia Brother    Dementia Paternal Grandmother     Review of Systems: Review of Systems  Constitutional:  Negative for chills and fever.  HENT:  Negative for hearing loss.   Respiratory:  Negative for shortness of breath.   Cardiovascular:  Negative for chest pain.  Gastrointestinal:  Positive for abdominal pain. Negative for nausea and vomiting.  Genitourinary:  Negative for dysuria.  Musculoskeletal:  Negative for myalgias.  Skin:  Negative for rash.  Neurological:  Negative for dizziness.  Psychiatric/Behavioral:  Negative for depression.  Physical Exam BP (!) 152/92   Pulse 83   Temp 98.3 F (36.8 C)   Ht 5\' 8"  (1.727 m)   Wt 245 lb (111.1 kg)   SpO2 97%   BMI 37.25 kg/m  CONSTITUTIONAL: No acute distress, well-nourished HEENT:  Normocephalic, atraumatic, extraocular motion intact. NECK: Trachea is midline, and there is no jugular venous distension.  RESPIRATORY:  Lungs are clear, and breath sounds are equal bilaterally. Normal respiratory effort without pathologic use of accessory muscles. CARDIOVASCULAR: Heart is regular without murmurs, gallops, or rubs. GI: The abdomen is soft, nondistended, currently nontender to palpation.  Patient has  well-healed bilateral open inguinal hernia repair scars.  I am unable to palpate any hernia recurrence or bulging areas in either groin. There were no palpable masses.  MUSCULOSKELETAL:  Normal muscle strength and tone in all four extremities.  No peripheral edema or cyanosis. SKIN: Skin turgor is normal. There are no pathologic skin lesions.  NEUROLOGIC:  Motor and sensation is grossly normal.  Cranial nerves are grossly intact. PSYCH:  Alert and oriented to person, place and time. Affect is normal.  Laboratory Analysis: No results found for this or any previous visit (from the past 24 hour(s)).  Imaging: No results found.  Assessment and Plan: This is a 60 y.o. male with bilateral groin pain with a prior history of open inguinal hernia repair many years ago.  - Discussed with the patient and on exam, his exam is very benign without any evidence or suspicion for recurrence.  He may have potentially pulled a muscle if he has been more active recently.  I advised him that he try to limit amount of strenuous activity over the next few weeks to a month to see if this improves the discomfort.  Discussed with him that at this point I do not think we need to obtain any imaging study.  However if his discomfort has not resolved or continues to improve over the next month, he should give [place] a call so we can order a CT scan to further evaluate. - Follow-up as needed.   Face-to-face time spent with the patient and care providers was 45 minutes, with more than 50% of the time spent counseling, educating, and coordinating care of the patient.     Melvyn Neth, [facility]

## 2021-06-16 ENCOUNTER — Ambulatory Visit: Payer: Self-pay | Admitting: Surgery

## 2021-07-07 ENCOUNTER — Other Ambulatory Visit: Payer: Self-pay | Admitting: Family Medicine

## 2021-07-07 DIAGNOSIS — J4530 Mild persistent asthma, uncomplicated: Secondary | ICD-10-CM

## 2021-07-07 DIAGNOSIS — K219 Gastro-esophageal reflux disease without esophagitis: Secondary | ICD-10-CM

## 2021-07-29 ENCOUNTER — Other Ambulatory Visit: Payer: Self-pay

## 2021-07-29 ENCOUNTER — Ambulatory Visit: Payer: BC Managed Care – PPO | Admitting: Family Medicine

## 2021-07-29 ENCOUNTER — Encounter: Payer: Self-pay | Admitting: Family Medicine

## 2021-07-29 VITALS — BP 155/88 | HR 104 | Ht 68.0 in | Wt 245.2 lb

## 2021-07-29 DIAGNOSIS — R7303 Prediabetes: Secondary | ICD-10-CM

## 2021-07-29 DIAGNOSIS — K219 Gastro-esophageal reflux disease without esophagitis: Secondary | ICD-10-CM | POA: Diagnosis not present

## 2021-07-29 DIAGNOSIS — J4530 Mild persistent asthma, uncomplicated: Secondary | ICD-10-CM | POA: Diagnosis not present

## 2021-07-29 LAB — POCT GLYCOSYLATED HEMOGLOBIN (HGB A1C): Hemoglobin A1C: 5.6 % (ref 4.0–5.6)

## 2021-07-29 MED ORDER — ALBUTEROL SULFATE HFA 108 (90 BASE) MCG/ACT IN AERS: 2.0000 | INHALATION_SPRAY | RESPIRATORY_TRACT | 3 refills | Status: DC | PRN

## 2021-07-29 MED ORDER — ADVAIR DISKUS 500-50 MCG/ACT IN AEPB: 1.0000 | INHALATION_SPRAY | Freq: Two times a day (BID) | RESPIRATORY_TRACT | 11 refills | Status: DC

## 2021-07-29 NOTE — Progress Notes (Signed)
Subjective:    Patient ID: Derek Cook, male    DOB: 07/18/1961, 60 y.o.   MRN: [id number]  Derek Cook is a 60 y.o. male presenting on 07/29/2021 for Asthma   HPI  Change in his job recently, now dramatic improvement He reports overall multiple symptoms have resolved due to hours/work change and reduced physical and mental stress  Headache, neck pain Digestive problem, abdominal pain Fatigue, brain fog  All resolved. Now works now for [employer] in [place], he is now home every night. New truck with better cabin and better suspension with better ride. Less physical jostling   Next insurance will start with new job. He has 2 months until new insurance.  He needs refills today.  Asthma, mild Persistent Needs Advair 500mg  diskus and also Albuterol PRN  GERD Remains OFF Omeprazole 40mg  daily for past 1 month.  Pre-Diabetes Due for A1c prior result 6.0, 01/2021 He is working on improving diet.  Declines Flu Shot  Depression screen [facility] 2/9 01/14/2021 08/19/2020 08/06/2020  Decreased Interest 0 0 0  Down, Depressed, Hopeless 0 0 0  PHQ - 2 Score 0 0 0  Altered sleeping 0 - -  Tired, decreased energy 0 - -  Change in appetite 0 - -  Feeling bad or failure about yourself  0 - -  Trouble concentrating 0 - -  Moving slowly or fidgety/restless 0 - -  Suicidal thoughts 0 - -  PHQ-9 Score 0 - -  Difficult doing work/chores Not difficult at all - -    Social History   Tobacco Use   Smoking status: Former    Types: Cigarettes    Quit date: 09/07/2009    Years since quitting: 11.8   Smokeless tobacco: Former  Scientific laboratory technician Use: Never used  Substance Use Topics   Alcohol use: No   Drug use: No    Review of Systems Per HPI unless specifically indicated above     Objective:    BP (!) 155/88   Pulse (!) 104   Ht 5\' 8"  (1.727 m)   Wt 245 lb 3.2 oz (111.2 kg)   SpO2 99%   BMI 37.28 kg/m   Wt Readings from Last 3 Encounters:  07/29/21 245 lb 3.2  oz (111.2 kg)  06/13/21 245 lb (111.1 kg)  03/10/21 245 lb (111.1 kg)    Physical Exam Vitals and nursing note reviewed.  Constitutional:      General: He is not in acute distress.    Appearance: Normal appearance. He is well-developed. He is not diaphoretic.     Comments: Well-appearing, comfortable, cooperative  HENT:     Head: Normocephalic and atraumatic.  Eyes:     General:        Right eye: No discharge.        Left eye: No discharge.     Conjunctiva/sclera: Conjunctivae normal.  Cardiovascular:     Rate and Rhythm: Normal rate.  Pulmonary:     Effort: Pulmonary effort is normal.  Skin:    General: Skin is warm and dry.     Findings: No erythema or rash.  Neurological:     Mental Status: He is alert and oriented to person, place, and time.  Psychiatric:        Mood and Affect: Mood normal.        Behavior: Behavior normal.        Thought Content: Thought content normal.  Comments: Well groomed, good eye contact, normal speech and thoughts   Results for orders placed or performed in visit on 07/29/21  POCT glycosylated hemoglobin (Hb A1C)  Result Value Ref Range   Hemoglobin A1C 5.6 4.0 - 5.6 %   Recent Labs    01/07/21 0830 07/29/21 1517  HGBA1C 6.0* 5.6       Assessment & Plan:   Problem List Items Addressed This Visit     Prediabetes   Relevant Orders   POCT glycosylated hemoglobin (Hb A1C) (Completed)   Mild persistent asthma - Primary   Relevant Medications   fluticasone-salmeterol (ADVAIR DISKUS) 500-50 MCG/ACT AEPB   albuterol (VENTOLIN HFA) 108 (90 Base) MCG/ACT inhaler   GERD (gastroesophageal reflux disease)    PreDM A1c today 5.6 improved control Encouraged continue lifestyle management No medications required.  Asthma, mild persistent Without flare up Will refill Advair diksus 500-42mcg 1puff BID and Albuterol PRN sent to [facility]  GERD Improved, remain off PPI 40mg   Meds ordered this encounter  Medications    fluticasone-salmeterol (ADVAIR DISKUS) 500-50 MCG/ACT AEPB    Sig: Inhale 1 puff into the lungs in the morning and at bedtime.    Dispense:  60 each    Refill:  11   albuterol (VENTOLIN HFA) 108 (90 Base) MCG/ACT inhaler    Sig: Inhale 2 puffs into the lungs every 4 (four) hours as needed for wheezing or shortness of breath.    Dispense:  8.5 g    Refill:  3      Follow up plan: Return if symptoms worsen or fail to improve.   [name], [facility] 07/29/2021, 2:52 PM

## 2021-07-29 NOTE — Patient Instructions (Addendum)
Thank you for coming to the office today.  A1c today.  Remain off Omeprazole 40mg  daily for now, if you develop problem in future can re-start it.  Refilled both Advair generic 500 and Albuterol to [facility]   Please schedule a Follow-up Appointment to: Return if symptoms worsen or fail to improve.  If you have any other questions or concerns, please feel free to call the office or send a message through [place]. You may also schedule an earlier appointment if necessary.  Additionally, you may be receiving a survey about your experience at our office within a few days to 1 week by e-mail or mail. We value your feedback.  [name], DO [place]

## 2022-04-28 ENCOUNTER — Ambulatory Visit (INDEPENDENT_AMBULATORY_CARE_PROVIDER_SITE_OTHER): Payer: BC Managed Care – PPO | Admitting: Family Medicine

## 2022-04-28 VITALS — BP 142/83 | HR 86 | Ht 68.0 in | Wt 251.4 lb

## 2022-04-28 DIAGNOSIS — Z125 Encounter for screening for malignant neoplasm of prostate: Secondary | ICD-10-CM

## 2022-04-28 DIAGNOSIS — E782 Mixed hyperlipidemia: Secondary | ICD-10-CM

## 2022-04-28 DIAGNOSIS — E538 Deficiency of other specified B group vitamins: Secondary | ICD-10-CM

## 2022-04-28 DIAGNOSIS — E559 Vitamin D deficiency, unspecified: Secondary | ICD-10-CM

## 2022-04-28 DIAGNOSIS — H6123 Impacted cerumen, bilateral: Secondary | ICD-10-CM

## 2022-04-28 DIAGNOSIS — J4530 Mild persistent asthma, uncomplicated: Secondary | ICD-10-CM

## 2022-04-28 DIAGNOSIS — R351 Nocturia: Secondary | ICD-10-CM

## 2022-04-28 DIAGNOSIS — R7303 Prediabetes: Secondary | ICD-10-CM

## 2022-04-28 DIAGNOSIS — N529 Male erectile dysfunction, unspecified: Secondary | ICD-10-CM

## 2022-04-28 DIAGNOSIS — R7989 Other specified abnormal findings of blood chemistry: Secondary | ICD-10-CM

## 2022-04-28 MED ORDER — ALBUTEROL SULFATE HFA 108 (90 BASE) MCG/ACT IN AERS: 2.0000 | INHALATION_SPRAY | RESPIRATORY_TRACT | 3 refills | Status: DC | PRN

## 2022-04-28 MED ORDER — TADALAFIL 20 MG PO TABS: 20.0000 mg | ORAL_TABLET | ORAL | 11 refills | Status: DC | PRN

## 2022-04-28 MED ORDER — FLUTICASONE-SALMETEROL 500-50 MCG/ACT IN AEPB: 1.0000 | INHALATION_SPRAY | Freq: Two times a day (BID) | RESPIRATORY_TRACT | 11 refills | Status: DC

## 2022-04-28 NOTE — Patient Instructions (Addendum)
Thank you for coming to the office today.  Lab testing today.  Refilled medication inhalers to [facility]  Printed Cialis   Colon Cancer Screening: - For all adults age 59+ routine colon cancer screening is highly recommended.     - Recent guidelines from [place] recommend starting age of 11 - Early detection of colon cancer is important, because often there are no warning signs or symptoms, also if found early usually it can be cured. Late stage is hard to treat.  - If you are not interested in Colonoscopy screening (if done and normal you could be cleared for 5 to 10 years until next due), then Cologuard is an excellent alternative for screening test for Colon Cancer. It is highly sensitive for detecting DNA of colon cancer from even the earliest stages. Also, there is NO bowel prep required. - If Cologuard is NEGATIVE, then it is good for 3 years before next due - If Cologuard is POSITIVE, then it is strongly advised to get a Colonoscopy, which allows the GI doctor to locate the source of the cancer or polyp (even very early stage) and treat it by removing it. ------------------------- If you would like to proceed with Cologuard (stool DNA test) - FIRST, call your insurance company and tell them you want to check cost of Cologuard tell them CPT Code [phone] (it may be completely covered and you could get for no cost, OR max cost without any coverage is about $600). Also, keep in mind if you do NOT open the kit, and decide not to do the test, you will NOT be charged, you should contact the company if you decide not to do the test. - If you want to proceed, you can notify us (phone message, [place], or at next visit) and we will order it for you. The test kit will be delivered to you house within about 1 week. Follow instructions to collect sample, you may call the company for any help or questions, 24/7 telephone support at [phone].    Please schedule a Follow-up  Appointment to: Return in about 6 months (around 10/29/2022) for 3-6 month follow-up Weight Management / Pre-DM A1c.  If you have any other questions or concerns, please feel free to call the office or send a message through [place]. You may also schedule an earlier appointment if necessary.  Additionally, you may be receiving a survey about your experience at our office within a few days to 1 week by e-mail or mail. We value your feedback.  [name], DO [place]

## 2022-04-28 NOTE — Progress Notes (Signed)
Subjective:    Patient ID: Derek Cook, male    DOB: 1961-01-02, 61 y.o.   MRN: [id number]  Derek Cook is a 61 y.o. male presenting on 04/28/2022 for Annual Exam   HPI  Here for Annual Physical and Lab Review   HYPERLIPIDEMIA: - Reports chronic concerns. Last lipid panel 01/2021, with elevated TG >400 previously years with TG 600-700 range so this has improved, unable to calc LDL, may need direct LDL lab No longer on rx cholesterol medication in past had failed several with side effect myalgia Due for lipid, he is non fasting today but cannot return   Pre-Diabetes Reports no concerns with lifestyle still goal to improve, last A1c 6.0%, previously 5.8 to 5.9 years prior No meds Lifestyle: - Diet (Goal to limit sugars in sodas)  Denies hypoglycemia, polyuria, visual changes, numbness or tingling.   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 He had lab TSH checked as a result, it showed mild elevated 5.47. no other abnormal or results in past. - he has tried Vitamin B12 1088mg dissolving. Needs lab   Mild Asthma, Persistent New job, to be more available to care for wife with her heart condition He noticed a lot of dust in trailers, and he has had issues with wheezing and breathing, due to particulates he was breathing in. He developed more difficult time with breathing. 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  Health Maintenance:  He will check cost of Cologuard      04/28/2022   10:43 AM 01/14/2021    1:19 PM 08/19/2020    2:32 PM  Depression screen PHQ 2/9  Decreased Interest 0 0 0  Down, Depressed, Hopeless 0 0 0  PHQ - 2 Score 0 0 0  Altered sleeping 0 0   Tired, decreased energy 0 0   Change in appetite 0 0   Feeling bad or failure about yourself  0 0   Trouble concentrating 0 0   Moving slowly or fidgety/restless 0 0   Suicidal thoughts 0 0   PHQ-9 Score 0 0    Difficult doing work/chores Not difficult at all Not difficult at all     Past Medical History:  Diagnosis Date   Allergy    Asthma    Colon polyp    GERD (gastroesophageal reflux disease)    Hyperlipidemia    Past Surgical History:  Procedure Laterality Date   CLAVICLE EXCISION     HERNIA REPAIR Bilateral 2004, 2005   Inguinal repair with mesh   WRIST ARTHROPLASTY     Social History   Socioeconomic History   Marital status: Married    Spouse name: Not on file   Number of children: Not on file   Years of education: Not on file   Highest education level: Not on file  Occupational History   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)  Tobacco Use   Smoking status: Former    Types: Cigarettes    Quit date: 09/07/2009    Years since quitting: 12.6   Smokeless tobacco: Former  VScientific laboratory technicianUse: Never used  Substance and Sexual Activity   Alcohol use: No   Drug use: No   Sexual activity: Not on file  Other Topics Concern   Not on file  Social History Narrative  Not on file   Social Determinants of Health   Financial Resource Strain: Not on file  Food Insecurity: Not on file  Transportation Needs: Not on file  Physical Activity: Not on file  Stress: Not on file  Social Connections: Not on file  Intimate Partner Violence: Not on file   Family History  Problem Relation Age of Onset   Cancer Mother        melanoma   Diabetes Mother    Hyperlipidemia Mother    Dementia Mother    Cancer Father    Cancer Sister    Heart disease Sister    Diabetes Brother    Hyperlipidemia Brother    Dementia Paternal Grandmother    Current Outpatient Medications on File Prior to Visit  Medication Sig   aspirin EC 81 MG tablet Take 162 mg by mouth daily.    hydrochlorothiazide (HYDRODIURIL) 25 MG tablet Take 25 mg by mouth daily.   omeprazole (PRILOSEC) 40 MG capsule TAKE 1 CAPSULE BY MOUTH ONCE DAILY   No current  facility-administered medications on file prior to visit.    Review of Systems  Constitutional:  Negative for activity change, appetite change, chills, diaphoresis, fatigue and fever.  HENT:  Negative for congestion and hearing loss.   Eyes:  Negative for visual disturbance.  Respiratory:  Negative for cough, chest tightness, shortness of breath and wheezing.   Cardiovascular:  Negative for chest pain, palpitations and leg swelling.  Gastrointestinal:  Negative for abdominal pain, constipation, diarrhea, nausea and vomiting.  Genitourinary:  Negative for dysuria, frequency and hematuria.  Musculoskeletal:  Negative for arthralgias and neck pain.  Skin:  Negative for rash.  Neurological:  Negative for dizziness, weakness, light-headedness, numbness and headaches.  Hematological:  Negative for adenopathy.  Psychiatric/Behavioral:  Negative for behavioral problems, dysphoric mood and sleep disturbance.    Per HPI unless specifically indicated above      Objective:    BP (!) 142/83   Pulse 86   Ht '5\' 8"'$  (1.727 m)   Wt 251 lb 6.4 oz (114 kg)   SpO2 98%   BMI 38.23 kg/m   Wt Readings from Last 3 Encounters:  04/28/22 251 lb 6.4 oz (114 kg)  07/29/21 245 lb 3.2 oz (111.2 kg)  06/13/21 245 lb (111.1 kg)    Physical Exam Vitals and nursing note reviewed.  Constitutional:      General: He is not in acute distress.    Appearance: He is well-developed. He is obese. He is not diaphoretic.     Comments: Well-appearing, comfortable, cooperative  HENT:     Head: Normocephalic and atraumatic.     Right Ear: There is impacted cerumen.     Left Ear: There is impacted cerumen.  Eyes:     General:        Right eye: No discharge.        Left eye: No discharge.     Conjunctiva/sclera: Conjunctivae normal.     Pupils: Pupils are equal, round, and reactive to light.  Neck:     Thyroid: No thyromegaly.     Vascular: No carotid bruit.  Cardiovascular:     Rate and Rhythm: Normal rate and  regular rhythm.     Pulses: Normal pulses.     Heart sounds: Normal heart sounds. No murmur heard. Pulmonary:     Effort: Pulmonary effort is normal. No respiratory distress.     Breath sounds: Normal breath sounds. No wheezing or rales.  Abdominal:  General: Bowel sounds are normal. There is no distension.     Palpations: Abdomen is soft. There is no mass.     Tenderness: There is no abdominal tenderness.  Musculoskeletal:        General: No tenderness. Normal range of motion.     Cervical back: Normal range of motion and neck supple.     Right lower leg: No edema.     Left lower leg: No edema.     Comments: Upper / Lower Extremities: - Normal muscle tone, strength bilateral upper extremities 5/5, lower extremities 5/5  Lymphadenopathy:     Cervical: No cervical adenopathy.  Skin:    General: Skin is warm and dry.     Findings: No erythema or rash.  Neurological:     Mental Status: He is alert and oriented to person, place, and time.     Comments: Distal sensation intact to light touch all extremities  Psychiatric:        Mood and Affect: Mood normal.        Behavior: Behavior normal.        Thought Content: Thought content normal.     Comments: Well groomed, good eye contact, normal speech and thoughts    ________________________________________________________ PROCEDURE NOTE Date: 04/28/22 Bilateral Lavage / Cerumen Removal Discussed benefits and risks (including pain / discomforts, dizziness, minor abrasion of ear canal). Verbal consent given by patient. Medication:  carbamide peroxide ear drops, Ear Lavage Solution (warm water + hydrogen peroxide) Performed by Loni Muse CMA / Dr Parks Ranger Several drops of carbamide peroxide placed in each ear, allowed to sit for few minutes. Ear lavage solution flushed into one ear at a time in attempt to dislodge and remove ear wax. Results successful removal of wax  Repeat Ear Exam: - Completely removed cerumen now, with  clear ear canals and visible TMs clear and normal appearance.    Results for orders placed or performed in visit on 07/29/21  POCT glycosylated hemoglobin (Hb A1C)  Result Value Ref Range   Hemoglobin A1C 5.6 4.0 - 5.6 %      Assessment & Plan:   Problem List Items Addressed This Visit     Hyperlipidemia   Relevant Medications   hydrochlorothiazide (HYDRODIURIL) 25 MG tablet   tadalafil (CIALIS) 20 MG tablet   Other Relevant Orders   COMPLETE METABOLIC PANEL WITH GFR   CBC with Differential/Platelet   Lipid panel   TSH   Mild persistent asthma   Relevant Medications   fluticasone-salmeterol (ADVAIR DISKUS) 500-50 MCG/ACT AEPB   albuterol (VENTOLIN HFA) 108 (90 Base) MCG/ACT inhaler   Morbid obesity (HCC)   Relevant Orders   COMPLETE METABOLIC PANEL WITH GFR   Lipid panel   Prediabetes - Primary   Relevant Orders   Hemoglobin A1c   Other Visit Diagnoses     Vitamin D deficiency       Relevant Orders   VITAMIN D 25 Hydroxy (Vit-D Deficiency, Fractures)   Vitamin B12 nutritional deficiency       Relevant Orders   Vitamin B12   Nocturia       Relevant Orders   PSA   Prostate cancer screening       Relevant Orders   PSA   Elevated TSH       Relevant Orders   TSH   T4, free   Erectile dysfunction, unspecified erectile dysfunction type       Relevant Medications   tadalafil (CIALIS) 20 MG tablet  Updated Health Maintenance information Non Fasting labs drawn today, pending Encouraged improvement to lifestyle with diet and exercise Goal of weight loss Discussion on return for future weight management as indicated if needs further med management for weight.  Will review Lipid panel, A1c, and make further recommendations.  Vitamin Deficiency check labs, he continues supplement.  Refilled inhalers for Asthma to [facility]  Printed rx Tadalafil, Goodrx  AVS info on Cologuard as option for Colon CA Screening  Cerumen impaction - resolved s/p flushing  today    Orders Placed This Encounter  Procedures   COMPLETE METABOLIC PANEL WITH GFR   CBC with Differential/Platelet   Lipid panel    Order Specific Question:   Has the patient fasted?    Answer:   No   Hemoglobin A1c   PSA   TSH   Vitamin B12   VITAMIN D 25 Hydroxy (Vit-D Deficiency, Fractures)   T4, free     Meds ordered this encounter  Medications   fluticasone-salmeterol (ADVAIR DISKUS) 500-50 MCG/ACT AEPB    Sig: Inhale 1 puff into the lungs in the morning and at bedtime.    Dispense:  60 each    Refill:  11    Add refills for future   albuterol (VENTOLIN HFA) 108 (90 Base) MCG/ACT inhaler    Sig: Inhale 2 puffs into the lungs every 4 (four) hours as needed for wheezing or shortness of breath.    Dispense:  8.5 g    Refill:  3    Add refills for future   tadalafil (CIALIS) 20 MG tablet    Sig: Take 1 tablet (20 mg total) by mouth every other day as needed for erectile dysfunction.    Dispense:  10 tablet    Refill:  11      Follow up plan: Return in about 6 months (around 10/29/2022) for 3-6 month follow-up Weight Management / Pre-DM A1c.  [name], [facility] 04/28/2022, 10:57 AM

## 2022-04-29 LAB — COMPLETE METABOLIC PANEL WITH GFR
AG Ratio: 1.8 (calc) (ref 1.0–2.5)
ALT: 30 U/L (ref 9–46)
AST: 24 U/L (ref 10–35)
Albumin: 4.6 g/dL (ref 3.6–5.1)
Alkaline phosphatase (APISO): 83 U/L (ref 35–144)
BUN: 16 mg/dL (ref 7–25)
CO2: 27 mmol/L (ref 20–32)
Calcium: 10.2 mg/dL (ref 8.6–10.3)
Chloride: 98 mmol/L (ref 98–110)
Creat: 1.17 mg/dL (ref 0.70–1.30)
Globulin: 2.6 g/dL (calc) (ref 1.9–3.7)
Glucose, Bld: 82 mg/dL (ref 65–139)
Potassium: 4.1 mmol/L (ref 3.5–5.3)
Sodium: 137 mmol/L (ref 135–146)
Total Bilirubin: 0.7 mg/dL (ref 0.2–1.2)
Total Protein: 7.2 g/dL (ref 6.1–8.1)
eGFR: 72 mL/min/{1.73_m2} (ref >=60)

## 2022-04-29 LAB — CBC WITH DIFFERENTIAL/PLATELET
Absolute Monocytes: 1252 cells/uL — ABNORMAL HIGH (ref 200–950)
Basophils Absolute: 111 cells/uL (ref 0–200)
Basophils Relative: 1.1 %
Eosinophils Absolute: 343 cells/uL (ref 15–500)
Eosinophils Relative: 3.4 %
HCT: 52.4 % — ABNORMAL HIGH (ref 38.5–50.0)
Hemoglobin: 17.4 g/dL — ABNORMAL HIGH (ref 13.2–17.1)
Lymphs Abs: 3131 cells/uL (ref 850–3900)
MCH: 27.9 pg (ref 27.0–33.0)
MCHC: 33.2 g/dL (ref 32.0–36.0)
MCV: 84 fL (ref 80.0–100.0)
MPV: 9.7 fL (ref 7.5–12.5)
Monocytes Relative: 12.4 %
Neutro Abs: 5262 cells/uL (ref 1500–7800)
Neutrophils Relative %: 52.1 %
Platelets: 334 10*3/uL (ref 140–400)
RBC: 6.24 10*6/uL — ABNORMAL HIGH (ref 4.20–5.80)
RDW: 13.8 % (ref 11.0–15.0)
Total Lymphocyte: 31 %
WBC: 10.1 10*3/uL (ref 3.8–10.8)

## 2022-04-29 LAB — HEMOGLOBIN A1C
Hgb A1c MFr Bld: 5.8 % of total Hgb — ABNORMAL HIGH (ref <5.7)
Mean Plasma Glucose: 120 mg/dL
eAG (mmol/L): 6.6 mmol/L

## 2022-04-29 LAB — LIPID PANEL
Cholesterol: 330 mg/dL — ABNORMAL HIGH (ref <200)
HDL: 46 mg/dL (ref >=40)
Non-HDL Cholesterol (Calc): 284 mg/dL (calc) — ABNORMAL HIGH (ref <130)
Total CHOL/HDL Ratio: 7.2 (calc) — ABNORMAL HIGH (ref <5.0)
Triglycerides: 623 mg/dL — ABNORMAL HIGH (ref <150)

## 2022-04-29 LAB — VITAMIN D 25 HYDROXY (VIT D DEFICIENCY, FRACTURES): Vit D, 25-Hydroxy: 26 ng/mL — ABNORMAL LOW (ref 30–100)

## 2022-04-29 LAB — VITAMIN B12: Vitamin B-12: 494 pg/mL (ref 200–1100)

## 2022-04-29 LAB — T4, FREE: Free T4: 0.9 ng/dL (ref 0.8–1.8)

## 2022-04-29 LAB — TSH: TSH: 2.39 mIU/L (ref 0.40–4.50)

## 2022-04-29 LAB — PSA: PSA: 0.43 ng/mL (ref <=4.00)

## 2022-06-12 ENCOUNTER — Ambulatory Visit: Payer: Self-pay | Admitting: *Deleted

## 2022-06-12 NOTE — Telephone Encounter (Signed)
  Chief Complaint: Right arm tingling and numb for the whole summer.    Symptoms: When he puts his arm behind his head it hurts real bad. Frequency: "It's been going on the whole summer" Pertinent Negatives: Patient denies not being able to use.    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 Made for 06/16/2022 at 8:40 with Dr. Parks Ranger.

## 2022-06-12 NOTE — Telephone Encounter (Signed)
Reason for Disposition  [1] Weakness of arm / hand, or leg / foot AND [2] is a chronic symptom (recurrent or ongoing AND present > 4 weeks)  Answer Assessment - Initial Assessment Questions 1. SYMPTOM: "What is the main symptom you are concerned about?" (e.g., weakness, numbness)     Numbness in right arm.   It's been going on for the summer.   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   I felt a pop in my neck and pain went down my right arm.    Now from my right elbow down it's pain, numbness.  I can use it fine.   If I put my right arm above my head while sitting it throbs and achs bad.I can use hand ok.   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 2. ONSET: "When did this start?" (minutes, hours, days; while sleeping)     Going on all summer. 3. LAST NORMAL: "When was the last time you (the patient) were normal (no symptoms)?"     I had a Charlie Horse in my calf last night.   I felt a knot in my calf size of a golf ball sticking up.    It's gone back down.   It's feeling better.    It's my right arm that's the problem. 4. PATTERN "Does this come and go, or has it been constant since it started?"  "Is it present now?"     All the time. 5. CARDIAC SYMPTOMS: "Have you had any of the following symptoms: chest pain, difficulty breathing, palpitations?"     Not asked 6. NEUROLOGIC SYMPTOMS: "Have you had any of the following symptoms: headache, dizziness, vision loss, double vision, changes in speech, unsteady on your feet?"     Not asked 7. OTHER SYMPTOMS: "Do you have any other symptoms?"     2-3 weeks ago I had blood work done.   Did it come back.    8. PREGNANCY: "Is there any chance you are pregnant?" "When was your last menstrual period?"     N/A  Protocols used: Neurologic Deficit-A-AH

## 2022-06-16 ENCOUNTER — Ambulatory Visit (INDEPENDENT_AMBULATORY_CARE_PROVIDER_SITE_OTHER): Payer: Self-pay | Admitting: Family Medicine

## 2022-06-16 ENCOUNTER — Encounter: Payer: Self-pay | Admitting: Family Medicine

## 2022-06-16 DIAGNOSIS — R202 Paresthesia of skin: Secondary | ICD-10-CM

## 2022-06-16 DIAGNOSIS — I10 Essential (primary) hypertension: Secondary | ICD-10-CM

## 2022-06-16 DIAGNOSIS — R7303 Prediabetes: Secondary | ICD-10-CM

## 2022-06-16 DIAGNOSIS — M542 Cervicalgia: Secondary | ICD-10-CM

## 2022-06-16 DIAGNOSIS — L918 Other hypertrophic disorders of the skin: Secondary | ICD-10-CM

## 2022-06-16 NOTE — Progress Notes (Signed)
Subjective:    Patient ID: Derek Cook, male    DOB: [birth year], 61 y.o.   MRN: [id number]  Derek Cook is a 61 y.o. male presenting on 06/16/2022 for Neck Pain and Numbness   HPI  reviewed labs from August A1c 5.8, below 6 His glucose was 82 Liver enzymes normalized TSH normalized to normal range Vitamin B12 494  Neck Pain Driving pain Right arm persistent paresthesia since that time. Worse with throbbing pain and numbness radiating into R hand, tips of finger index. Asking about chiropractor to help Neck Now has some improved muscle spasms  Chiropractor - [facility]  HYPERLIPIDEMIA: Still elevated TG >600, prior 400-600 range No longer on rx cholesterol medication in past had failed several with side effect myalgia   Pre-Diabetes Reports no concerns with lifestyle still goal to improve A1c down to 5.8 improved No meds Lifestyle: - Diet (Goal to limit sugars in sodas)  Denies hypoglycemia, polyuria, visual changes, numbness or tingling.   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 He had lab TSH checked as a result, it showed mild elevated 5.47. no other abnormal or results in past. - he has tried Vitamin B12 1085mg dissolving. Needs lab   Mild Asthma, Persistent Wearing mask to avoid dust. Improved some      06/16/2022    9:55 AM 04/28/2022   10:43 AM 01/14/2021    1:19 PM  Depression screen PHQ 2/9  Decreased Interest 0 0 0  Down, Depressed, Hopeless 0 0 0  PHQ - 2 Score 0 0 0  Altered sleeping 0 0 0  Tired, decreased energy 0 0 0  Change in appetite 0 0 0  Feeling bad or failure about yourself  0 0 0  Trouble concentrating 0 0 0  Moving slowly or fidgety/restless 0 0 0  Suicidal thoughts 0 0 0  PHQ-9 Score 0 0 0  Difficult doing work/chores Not difficult at all Not difficult at all Not difficult at all    Social History   Tobacco Use   Smoking status: Former    Types:  Cigarettes    Quit date: 09/07/2009    Years since quitting: 12.7   Smokeless tobacco: Former  VScientific laboratory technicianUse: Never used  Substance Use Topics   Alcohol use: No   Drug use: No    Review of Systems Per HPI unless specifically indicated above     Objective:    BP (!) 144/86 (BP Location: Left Arm, Cuff Size: Large)   Pulse 90   Ht 5' 8"  (1.727 m)   Wt 266 lb (120.7 kg)   SpO2 98%   BMI 40.45 kg/m   Wt Readings from Last 3 Encounters:  06/16/22 266 lb (120.7 kg)  04/28/22 251 lb 6.4 oz (114 kg)  07/29/21 245 lb 3.2 oz (111.2 kg)    Physical Exam Vitals and nursing note reviewed.  Constitutional:      General: He is not in acute distress.    Appearance: Normal appearance. He is well-developed. He is obese. He is not diaphoretic.     Comments: Well-appearing, comfortable, cooperative  HENT:     Head: Normocephalic and atraumatic.  Eyes:     General:        Right eye: No discharge.        Left eye: No discharge.     Conjunctiva/sclera: Conjunctivae normal.  Cardiovascular:     Rate and Rhythm:  Normal rate.  Pulmonary:     Effort: Pulmonary effort is normal.  Skin:    General: Skin is warm and dry.     Findings: No erythema or rash.  Neurological:     Mental Status: He is alert and oriented to person, place, and time.  Psychiatric:        Mood and Affect: Mood normal.        Behavior: Behavior normal.        Thought Content: Thought content normal.     Comments: Well groomed, good eye contact, normal speech and thoughts      Recent Labs    07/29/21 1517 04/28/22 1147  HGBA1C 5.6 5.8*     Results for orders placed or performed in visit on 04/28/22  COMPLETE METABOLIC PANEL WITH GFR  Result Value Ref Range   Glucose, Bld 82 65 - 139 mg/dL   BUN 16 7 - 25 mg/dL   Creat 1.17 0.70 - 1.30 mg/dL   eGFR 72 > OR = 60 mL/min/1.4m   BUN/Creatinine Ratio SEE NOTE: 6 - 22 (calc)   Sodium 137 135 - 146 mmol/L   Potassium 4.1 3.5 - 5.3 mmol/L    Chloride 98 98 - 110 mmol/L   CO2 27 20 - 32 mmol/L   Calcium 10.2 8.6 - 10.3 mg/dL   Total Protein 7.2 6.1 - 8.1 g/dL   Albumin 4.6 3.6 - 5.1 g/dL   Globulin 2.6 1.9 - 3.7 g/dL (calc)   AG Ratio 1.8 1.0 - 2.5 (calc)   Total Bilirubin 0.7 0.2 - 1.2 mg/dL   Alkaline phosphatase (APISO) 83 35 - 144 U/L   AST 24 10 - 35 U/L   ALT 30 9 - 46 U/L  CBC with Differential/Platelet  Result Value Ref Range   WBC 10.1 3.8 - 10.8 Thousand/uL   RBC 6.24 (H) 4.20 - 5.80 Million/uL   Hemoglobin 17.4 (H) 13.2 - 17.1 g/dL   HCT 52.4 (H) 38.5 - 50.0 %   MCV 84.0 80.0 - 100.0 fL   MCH 27.9 27.0 - 33.0 pg   MCHC 33.2 32.0 - 36.0 g/dL   RDW 13.8 11.0 - 15.0 %   Platelets 334 140 - 400 Thousand/uL   MPV 9.7 7.5 - 12.5 fL   Neutro Abs 5,262 1,500 - 7,800 cells/uL   Lymphs Abs 3,131 850 - 3,900 cells/uL   Absolute Monocytes 1,252 (H) 200 - 950 cells/uL   Eosinophils Absolute 343 15 - 500 cells/uL   Basophils Absolute 111 0 - 200 cells/uL   Neutrophils Relative % 52.1 %   Total Lymphocyte 31.0 %   Monocytes Relative 12.4 %   Eosinophils Relative 3.4 %   Basophils Relative 1.1 %  Lipid panel  Result Value Ref Range   Cholesterol 330 (H) <200 mg/dL   HDL 46 > OR = 40 mg/dL   Triglycerides 623 (H) <150 mg/dL   LDL Cholesterol (Calc)  mg/dL (calc)   Total CHOL/HDL Ratio 7.2 (H) <5.0 (calc)   Non-HDL Cholesterol (Calc) 284 (H) <130 mg/dL (calc)  Hemoglobin A1c  Result Value Ref Range   Hgb A1c MFr Bld 5.8 (H) <5.7 % of total Hgb   Mean Plasma Glucose 120 mg/dL   eAG (mmol/L) 6.6 mmol/L  PSA  Result Value Ref Range   PSA 0.43 < OR = 4.00 ng/mL  TSH  Result Value Ref Range   TSH 2.39 0.40 - 4.50 mIU/L  Vitamin B12  Result Value Ref Range  Vitamin B-12 494 200 - 1,100 pg/mL  VITAMIN D 25 Hydroxy (Vit-D Deficiency, Fractures)  Result Value Ref Range   Vit D, 25-Hydroxy 26 (L) 30 - 100 ng/mL  T4, free  Result Value Ref Range   Free T4 0.9 0.8 - 1.8 ng/dL      Assessment & Plan:    Problem List Items Addressed This Visit     Hypertension   Morbid obesity ([place]) - Primary   Prediabetes   Other Visit Diagnoses     Paresthesia of right arm       Relevant Orders   Ambulatory referral to Chiropractic   Skin tags, multiple acquired       Neck pain       Relevant Orders   Ambulatory referral to Chiropractic       Lab results from August  Thyroid is back to normal range. TSH and T4 normal A1c looks good 5.8, mild Pre Diabetes range Vitamin B12 494 normal PSA 0.43 normal, negative Chemistry is normal for kidney and liver function Vitamin D mild low at 26, not significant, should be >30+, ok to take Vitamin D3 5,000 unit daily for 3 months Blood counts show higher than average Hemoglobin 17.4, this could be from reduced oxygen from breathing or sleep apnea it can cause higher hemoglobin. Previously normal, I would follow up on this in future to monitor progress.   Cholesterol still very high Triglyceride as you are aware. I would suggest a future consultation with a Cholesterol specialist we have in [place] if you would be interested to discuss the newer medications, not statins with them.  Likely experiencing Paresthesia nerve impingement  Referral to [facility] Chiropractor / Accupuncture  Elevated BP Manual repeat improve but still elevated.  Return for skin tag removal  No orders of the defined types were placed in this encounter.  Orders Placed This Encounter  Procedures   Ambulatory referral to Chiropractic    Referral Priority:   Routine    Referral Type:   Chiropractic    Referral Reason:   Specialty Services Required    Requested Specialty:   Chiropractic Medicine    Number of Visits Requested:   1      Follow up plan: Return for Next available Procedure Visit Skin Tag removal (21mn).   ANobie Putnam D[facility] 06/16/2022, 9:34 AM

## 2022-06-16 NOTE — Patient Instructions (Addendum)
Thank you for coming to the office today.  Lab results from August  Thyroid is back to normal range. TSH and T4 normal A1c looks good 5.8, mild Pre Diabetes range Vitamin B12 494 normal PSA 0.43 normal, negative Chemistry is normal for kidney and liver function Vitamin D mild low at 26, not significant, should be >30+, ok to take Vitamin D3 5,000 unit daily for 3 months Blood counts show higher than average Hemoglobin 17.4, this could be from reduced oxygen from breathing or sleep apnea it can cause higher hemoglobin. Previously normal, I would follow up on this in future to monitor progress.   Cholesterol still very high Triglyceride as you are aware. I would suggest a future consultation with a Cholesterol specialist we have in [place] if you would be interested to discuss the newer medications, not statins with them.  Likely experiencing Paresthesia nerve impingement  Let me know on which chiropractor, send message and I can refer  Please schedule a Follow-up Appointment to: Return for Next available Procedure Visit Skin Tag removal (73mn).  If you have any other questions or concerns, please feel free to call the office or send a message through [facility] You may also schedule an earlier appointment if necessary.  Additionally, you may be receiving a survey about your experience at our office within a few days to 1 week by e-mail or mail. We value your feedback.  ANobie Putnam DO [place]

## 2022-06-25 ENCOUNTER — Ambulatory Visit: Payer: Self-pay | Admitting: Family Medicine

## 2022-07-03 ENCOUNTER — Emergency Department: Payer: Commercial Managed Care - PPO

## 2022-07-03 ENCOUNTER — Other Ambulatory Visit: Payer: Self-pay

## 2022-07-03 ENCOUNTER — Ambulatory Visit: Payer: Self-pay

## 2022-07-03 ENCOUNTER — Emergency Department
Admission: EM | Admit: 2022-07-03 | Discharge: 2022-07-03 | Disposition: A | Discharge status: Home / Self Care | Payer: Commercial Managed Care - PPO | Attending: Emergency Medicine | Admitting: Emergency Medicine

## 2022-07-03 DIAGNOSIS — M7989 Other specified soft tissue disorders: Secondary | ICD-10-CM | POA: Insufficient documentation

## 2022-07-03 DIAGNOSIS — M79604 Pain in right leg: Secondary | ICD-10-CM | POA: Diagnosis present

## 2022-07-03 LAB — BASIC METABOLIC PANEL
Anion gap: 8 (ref 5–15)
BUN: 15 mg/dL (ref 6–20)
CO2: 25 mmol/L (ref 22–32)
Calcium: 10.1 mg/dL (ref 8.9–10.3)
Chloride: 104 mmol/L (ref 98–111)
Creatinine, Ser: 1.1 mg/dL (ref 0.61–1.24)
GFR, Estimated: >60 mL/min (ref >60)
Glucose, Bld: 132 mg/dL — ABNORMAL HIGH (ref 70–99)
Potassium: 4 mmol/L (ref 3.5–5.1)
Sodium: 137 mmol/L (ref 135–145)

## 2022-07-03 LAB — CBC WITH DIFFERENTIAL/PLATELET
Abs Immature Granulocytes: 0.04 10*3/uL (ref 0.00–0.07)
Basophils Absolute: 0.1 10*3/uL (ref 0.0–0.1)
Basophils Relative: 1 %
Eosinophils Absolute: 0.3 10*3/uL (ref 0.0–0.5)
Eosinophils Relative: 3 %
HCT: 48.1 % (ref 39.0–52.0)
Hemoglobin: 15.7 g/dL (ref 13.0–17.0)
Immature Granulocytes: 0 %
Lymphocytes Relative: 36 %
Lymphs Abs: 3.4 10*3/uL (ref 0.7–4.0)
MCH: 27.4 pg (ref 26.0–34.0)
MCHC: 32.6 g/dL (ref 30.0–36.0)
MCV: 83.9 fL (ref 80.0–100.0)
Monocytes Absolute: 0.6 10*3/uL (ref 0.1–1.0)
Monocytes Relative: 6 %
Neutro Abs: 5 10*3/uL (ref 1.7–7.7)
Neutrophils Relative %: 54 %
Platelets: 297 10*3/uL (ref 150–400)
RBC: 5.73 MIL/uL (ref 4.22–5.81)
RDW: 13.7 % (ref 11.5–15.5)
WBC: 9.4 10*3/uL (ref 4.0–10.5)
nRBC: 0 % (ref 0.0–0.2)

## 2022-07-03 NOTE — Discharge Instructions (Signed)
Please follow up with the orthopedic surgeon. Continue to wear the compression stockings. Please seek medical attention for any high fevers, chest pain, shortness of breath, change in behavior, persistent vomiting, bloody stool or any other new or concerning symptoms.

## 2022-07-03 NOTE — ED Provider Notes (Signed)
[facility] Provider Note    Event Date/Time   First MD Initiated Contact with Patient 07/03/22 2057     (approximate)   History   Leg Pain   HPI  Derek Cook is a 61 y.o. male  who presents to the emergency department today because of concern for swelling and pain just proximal to the right ankle. Symptoms started about a month ago. The patient denies any trauma. Has been wearing compression stocking since it started without any significant relief or change in symptoms. 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     Physical Exam   Triage Vital Signs: ED Triage Vitals [07/03/22 1803]  Enc Vitals Group     BP (!) 133/94     Pulse Rate 99     Resp 18     Temp 98 F (36.7 C)     Temp src      SpO2 98 %     Weight 266 lb (120.7 kg)     Height      Head Circumference      Peak Flow      Pain Score 3     Pain Loc      Pain Edu?      Excl. in [place]?     Most recent vital signs: Vitals:   07/03/22 1803  BP: (!) 133/94  Pulse: 99  Resp: 18  Temp: 98 F (36.7 C)  SpO2: 98%    General: Awake, alert, oriented. CV:  Good peripheral perfusion.  Resp:  Normal effort.  Abd:  No distention.  Other:  Mild swelling just proximal to medial malleolus of the right ankle. No skin color change. No warmth.    ED Results / Procedures / Treatments   Labs (all labs ordered are listed, but only abnormal results are displayed) Labs Reviewed  BASIC METABOLIC PANEL - Abnormal; Notable for the following components:      Result Value   Glucose, Bld 132 (*)    All other components within normal limits  CBC WITH DIFFERENTIAL/PLATELET     EKG  I, Nance Pear, attending physician, personally viewed and interpreted this EKG  EKG Time: 1808 Rate: 88 Rhythm: normal sinus rhythm Axis: normal Intervals: qtc 396 QRS: narrow ST changes: no st elevation Impression: normal ekg  RADIOLOGY I independently interpreted and  visualized the [place] RLE. My interpretation: No obvious blood clot, no large fluid collection Radiology interpretation:  IMPRESSION:  Negative.   I independently interpreted and visualized the right ankle. My interpretation: No acute osseous abnormality Radiology interpretation:  IMPRESSION:  1. Unremarkable right ankle.      PROCEDURES:  Critical Care performed: No  Procedures   MEDICATIONS ORDERED IN ED: Medications - No data to display   IMPRESSION / MDM / [place] / ED COURSE  I reviewed the triage vital signs and the nursing notes.                              Differential diagnosis includes, but is not limited to, DVT, bone growth, arthritis.  Patient's presentation is most consistent with acute presentation with potential threat to life or bodily function.  Patient presents to the emergency department today because of concern for swelling and pain to his right lower leg. On exam patient does have small amount of swelling just proximal to the medial malleolus. [place] negative for  DVT. X-ray without any concerning bony lesion or abnormality. At this time given reassuring work up in the emergency department do think it is reasonable for patient to be discharged home. Will give orthopedic follow up information.  FINAL CLINICAL IMPRESSION(S) / ED DIAGNOSES   Final diagnoses:  Right leg swelling     Note:  This document was prepared using Dragon voice recognition software and may include unintentional dictation errors.    Nance Pear, MD 07/03/22 [phone]

## 2022-07-03 NOTE — ED Triage Notes (Signed)
Pt arrives with lower right leg pain that started a few weeks ago. Pt endorses SOB. Pt denies CP.

## 2022-07-03 NOTE — Telephone Encounter (Signed)
  Chief Complaint: Leg pain swelling, Mild long term SOB Symptoms: above Frequency: 3-4 weeks Pertinent Negatives: Patient denies Fever. Disposition: '[x]'$ ED /'[]'$ Urgent Care (no appt availability in office) / '[]'$ Appointment(In office/virtual)/ '[]'$  [facility]/ '[]'$ Home Care/ '[]'$ Refused Recommended Disposition /'[]'$  Mobile Bus/ '[]'$  Follow-up with PCP Additional Notes: PT had a bad leg cramp a few weeks back that seem to have not resolved.  PT reports that his legs swell everyday, but it resolves overnight. 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 Rt leg seems a bit more swollen and there is pain , mild to moderate, if touched. Pt also mentions he has been mildly SOB since summer. Pt is out of town, but will return later today. He will go to ED when he returns. He will monitor and seek immediate care if needed.    Reason for Disposition  [1] Thigh or calf pain AND [2] only 1 side AND [3] present > 1 hour  Answer Assessment - Initial Assessment Questions 1. ONSET: "When did the swelling start?" (e.g., minutes, hours, days)     3-4 weeks 2. LOCATION: "What part of the leg is swollen?"  "Are both legs swollen or just one leg?"     Lower right leg 3. SEVERITY: "How bad is the swelling?" (e.g., localized; mild, moderate, severe)   - Localized: Small area of swelling localized to one leg.   - MILD pedal edema: Swelling limited to foot and ankle, pitting edema < 1/4 inch (6 mm) deep, rest and elevation eliminate most or all swelling.   - MODERATE edema: Swelling of lower leg to knee, pitting edema > 1/4 inch (6 mm) deep, rest and elevation only partially reduce swelling.   - SEVERE edema: Swelling extends above knee, facial or hand swelling present.      moderate 4. REDNESS: "Does the swelling look red or infected?"     A little red 5. PAIN: "Is the swelling painful to touch?" If Yes, ask: "How painful is it?"   (Scale 1-10; mild, moderate or severe)     Right where the  spot is - mild -moderate 6. FEVER: "Do you have a fever?" If Yes, ask: "What is it, how was it measured, and when did it start?"      no 7. CAUSE: "What do you think is causing the leg swelling?"     Blood clot 8. MEDICAL HISTORY: "Do you have a history of blood clots (e.g., DVT), cancer, heart failure, kidney disease, or liver failure?"     no 9. RECURRENT SYMPTOM: "Have you had leg swelling before?" If Yes, ask: "When was the last time?" "What happened that time?"     Yes, but not like this 10. OTHER SYMPTOMS: "Do you have any other symptoms?" (e.g., chest pain, difficulty breathing)       Pain, short of breath 11. PREGNANCY: "Is there any chance you are pregnant?" "When was your last menstrual period?"  Protocols used: Leg Swelling and Edema-A-AH

## 2022-07-06 ENCOUNTER — Telehealth: Payer: Self-pay

## 2022-07-06 NOTE — Telephone Encounter (Signed)
Transition Care Management Follow-up Telephone Call Date of discharge and from where: 07/03/22 from [facility] ER How have you been since you were released from the hospital? Better  Any questions or concerns? No  Items Reviewed: Did the pt receive and understand the discharge instructions provided? Yes  Medications obtained and verified? No  Other? No  Any new allergies since your discharge? No  Dietary orders reviewed? No Do you have support at home? No   Home Care and Equipment/Supplies: Were home health services ordered? not applicable If so, what is the name of the agency?   Has the agency set up a time to come to the patient's home? not applicable Were any new equipment or medical supplies ordered?  No What is the name of the medical supply agency?  Were you able to get the supplies/equipment? not applicable Do you have any questions related to the use of the equipment or supplies? No  Functional Questionnaire: (I = Independent and D = Dependent) ADLs: I  Bathing/Dressing- I  Meal Prep- I  Eating- I  Maintaining continence- I  Transferring/Ambulation- I  Managing Meds- I  Follow up appointments reviewed:  PCP Hospital f/u appt confirmed? Yes  Scheduled to see Derek Cook on 07/10/22 @ 8:20. [facility] f/u appt confirmed? No  Are transportation arrangements needed? No  If their condition worsens, is the pt aware to call PCP or go to the Emergency Dept.? Yes Was the patient provided with contact information for the PCP's office or ED? Yes Was to pt encouraged to call back with questions or concerns? Yes

## 2022-07-10 ENCOUNTER — Ambulatory Visit (INDEPENDENT_AMBULATORY_CARE_PROVIDER_SITE_OTHER): Payer: Commercial Managed Care - PPO | Admitting: Family Medicine

## 2022-07-10 ENCOUNTER — Ambulatory Visit: Payer: Commercial Managed Care - PPO | Admitting: Family Medicine

## 2022-07-10 ENCOUNTER — Encounter: Payer: Self-pay | Admitting: Family Medicine

## 2022-07-10 VITALS — Ht 68.0 in | Wt 266.0 lb

## 2022-07-10 DIAGNOSIS — R29818 Other symptoms and signs involving the nervous system: Secondary | ICD-10-CM | POA: Diagnosis not present

## 2022-07-10 DIAGNOSIS — G4719 Other hypersomnia: Secondary | ICD-10-CM | POA: Diagnosis not present

## 2022-07-10 NOTE — Progress Notes (Signed)
Virtual Visit via Telephone The purpose of this virtual visit is to provide medical care while limiting exposure to the novel coronavirus (COVID19) for both patient and office staff.  Consent was obtained for phone visit:  Yes.   Answered questions that patient had about telehealth interaction:  Yes.   I discussed the limitations, risks, security and privacy concerns of performing an evaluation and management service by telephone. I also discussed with the patient that there may be a patient responsible charge related to this service. The patient expressed understanding and agreed to proceed.  Patient Location: Home Provider Location: [facility] (Office)  Participants in virtual visit: - Patient: Derek Cook - CMA: Orinda Kenner, [place] - Provider: Dr Parks Ranger  ---------------------------------------------------------------------- Chief Complaint  Patient presents with   Fatigue   referral for a sleep study   Headache    S: Reviewed CMA documentation. 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  He still feels drained tired and fatigued daily, he does work long hours.  Epworth Sleepiness Scale Total Score: 12 Sitting and reading - 3 Watching TV - 3 Sitting inactive in a public place - 1 As a passenger in a car for an hour without a break - 2 Lying down to rest in the afternoon when circumstances permit - 3 Sitting and talking to someone - 0 Sitting quietly after a lunch without alcohol - 0 In a car, while stopped for a few minutes in traffic - 0  STOP-Bang OSA scoring Snoring yes   Tiredness yes   Observed apneas yes   Pressure HTN yes   BMI > 35 kg/m2 yes   Age > 81  yes   Neck (male >17 in; Male >16 in)  yes   Gender male yes   OSA risk low  (0-2)  OSA risk intermediate (3-4)  OSA risk high (5+)  Total: High risk score 8    reviewed labs from August A1c 5.8, below 6 His glucose was 82 Liver enzymes normalized TSH normalized to normal range Vitamin B12 494     Pre-Diabetes Reports no concerns with lifestyle still goal to improve A1c down to 5.8 improved No meds Lifestyle: - Diet (Goal to limit sugars in sodas)  Denies hypoglycemia, polyuria, visual changes, numbness or tingling.    Muscle Jerking Right Calf with Cramping Reports recent ED 07/03/22 eval testing with negative ultrasound, no DVT.  Denies any fevers, chills, sweats, body ache, cough, shortness of breath, sinus pain or pressure, headache, abdominal pain, diarrhea  Past Medical History:  Diagnosis Date   Allergy    Asthma    Colon polyp    GERD (gastroesophageal reflux disease)    Hyperlipidemia    Social History   Tobacco Use   Smoking status: Former    Types: Cigarettes    Quit date: 09/07/2009    Years since quitting: 12.8   Smokeless tobacco: Former  Scientific laboratory technician Use: Never used  Substance Use Topics   Alcohol use: No   Drug use: No    Current Outpatient Medications:    albuterol (VENTOLIN HFA) 108 (90 Base) MCG/ACT inhaler, Inhale 2 puffs into the lungs every 4 (four) hours as needed for wheezing or shortness of breath., Disp: 8.5 g, Rfl: 3   fluticasone-salmeterol (  ADVAIR DISKUS) 500-50 MCG/ACT AEPB, Inhale 1 puff into the lungs in the morning and at bedtime., Disp: 60 each, Rfl: 11   omeprazole (PRILOSEC) 40 MG capsule, TAKE 1 CAPSULE BY MOUTH ONCE DAILY, Disp: 90 capsule, Rfl: 0   aspirin EC 81 MG tablet, Take 162 mg by mouth daily.  (Patient not taking: Reported on 07/10/2022), Disp: , Rfl:    tadalafil (CIALIS) 20 MG tablet, Take 1 tablet (20 mg total) by mouth every other day as needed for erectile dysfunction. (Patient not taking: Reported on 07/10/2022), Disp: 10 tablet, Rfl: 11     06/16/2022    9:55 AM 04/28/2022    10:43 AM 01/14/2021    1:19 PM  Depression screen PHQ 2/9  Decreased Interest 0 0 0  Down, Depressed, Hopeless 0 0 0  PHQ - 2 Score 0 0 0  Altered sleeping 0 0 0  Tired, decreased energy 0 0 0  Change in appetite 0 0 0  Feeling bad or failure about yourself  0 0 0  Trouble concentrating 0 0 0  Moving slowly or fidgety/restless 0 0 0  Suicidal thoughts 0 0 0  PHQ-9 Score 0 0 0  Difficult doing work/chores Not difficult at all Not difficult at all Not difficult at all       06/16/2022    9:55 AM 04/28/2022   10:43 AM 01/14/2021    1:19 PM  GAD 7 : Generalized Anxiety Score  Nervous, Anxious, on Edge 0 0 0  Control/stop worrying 0 0 0  Worry too much - different things 0 0 0  Trouble relaxing 0 0 0  Restless 0 0 0  Easily annoyed or irritable 0 0 0  Afraid - awful might happen 0 0 0  Total GAD 7 Score 0 0 0  Anxiety Difficulty Not difficult at all Not difficult at all Not difficult at all    -------------------------------------------------------------------------- O: No physical exam performed due to remote telephone encounter.  Ht _0  (1.727 m)   Wt 266 lb (120.7 kg)   BMI 40.45 kg/m   Lab results reviewed.  Recent Results (from the past 2160 hour(s))  COMPLETE METABOLIC PANEL WITH GFR     Status: None   Collection Time: 04/28/22 11:47 AM  Result Value Ref Range   Glucose, Bld 82 65 - 139 mg/dL    Comment: .        Non-fasting reference interval .    BUN 16 7 - 25 mg/dL   Creat 1.17 0.70 - 1.30 mg/dL   eGFR 72 > OR = 60 mL/min/1.31m   BUN/Creatinine Ratio SEE NOTE: 6 - 22 (calc)    Comment:    Not Reported: BUN and Creatinine are within    reference range. .    Sodium 137 135 - 146 mmol/L   Potassium 4.1 3.5 - 5.3 mmol/L   Chloride 98 98 - 110 mmol/L   CO2 27 20 - 32 mmol/L   Calcium 10.2 8.6 - 10.3 mg/dL   Total Protein 7.2 6.1 - 8.1 g/dL   Albumin 4.6 3.6 - 5.1 g/dL   Globulin 2.6 1.9 - 3.7 g/dL (calc)   AG Ratio 1.8 1.0 - 2.5 (calc)   Total  Bilirubin 0.7 0.2 - 1.2 mg/dL   Alkaline phosphatase (APISO) 83 35 - 144 U/L   AST 24 10 - 35 U/L   ALT 30 9 - 46 U/L  CBC with Differential/Platelet     Status: Abnormal   Collection  Time: 04/28/22 11:47 AM  Result Value Ref Range   WBC 10.1 3.8 - 10.8 Thousand/uL   RBC 6.24 (H) 4.20 - 5.80 Million/uL   Hemoglobin 17.4 (H) 13.2 - 17.1 g/dL   HCT 52.4 (H) 38.5 - 50.0 %   MCV 84.0 80.0 - 100.0 fL   MCH 27.9 27.0 - 33.0 pg   MCHC 33.2 32.0 - 36.0 g/dL   RDW 13.8 11.0 - 15.0 %   Platelets 334 140 - 400 Thousand/uL   MPV 9.7 7.5 - 12.5 fL   Neutro Abs 5,262 1,500 - 7,800 cells/uL   Lymphs Abs 3,131 850 - 3,900 cells/uL   Absolute Monocytes 1,252 (H) 200 - 950 cells/uL   Eosinophils Absolute 343 15 - 500 cells/uL   Basophils Absolute 111 0 - 200 cells/uL   Neutrophils Relative % 52.1 %   Total Lymphocyte 31.0 %   Monocytes Relative 12.4 %   Eosinophils Relative 3.4 %   Basophils Relative 1.1 %  Lipid panel     Status: Abnormal   Collection Time: 04/28/22 11:47 AM  Result Value Ref Range   Cholesterol 330 (H) <200 mg/dL   HDL 46 > OR = 40 mg/dL   Triglycerides 623 (H) <150 mg/dL    Comment: . If a non-fasting specimen was collected, consider repeat triglyceride testing on a fasting specimen if clinically indicated.  Yates Decamp et al. J. of Clin. Lipidol. 8250;0:370-488. . . There is increased risk of pancreatitis when the  triglyceride concentration is very high  (> or = 500 mg/dL, especially if > or = 1000 mg/dL).  Yates Decamp et al. J. of Clin. Lipidol. 8916;9:450-388. Marland Kitchen    LDL Cholesterol (Calc)  mg/dL (calc)    Comment: . LDL cholesterol not calculated. Triglyceride levels greater than 400 mg/dL invalidate calculated LDL results. . Reference range: <100 . Desirable range <100 mg/dL for primary prevention;   <70 mg/dL for patients with CHD or diabetic patients  with > or = 2 CHD risk factors. Marland Kitchen LDL-C is now calculated using the Martin-Hopkins  calculation, which is a  validated novel method providing  better accuracy than the Friedewald equation in the  estimation of LDL-C.  Cresenciano Genre et al. Annamaria Helling. 8280;034(91): 2061-2068  (http://education.[facility].com/faq/FAQ164)    Total CHOL/HDL Ratio 7.2 (H) <5.0 (calc)   Non-HDL Cholesterol (Calc) 284 (H) <130 mg/dL (calc)    Comment: Non-HDL level > or = 220 is very high and may indicate  genetic familial hypercholesterolemia (FH). Clinical  assessment and measurement of blood lipid levels  should be considered for all first-degree relatives  of patients with an FH diagnosis. . For patients with diabetes plus 1 major ASCVD risk  factor, treating to a non-HDL-C goal of <100 mg/dL  (LDL-C of <70 mg/dL) is considered a therapeutic  option.   Hemoglobin A1c     Status: Abnormal   Collection Time: 04/28/22 11:47 AM  Result Value Ref Range   Hgb A1c MFr Bld 5.8 (H) <5.7 % of total Hgb    Comment: For someone without known diabetes, a hemoglobin  A1c value between 5.7% and 6.4% is consistent with prediabetes and should be confirmed with a  follow-up test. . For someone with known diabetes, a value <7% indicates that their diabetes is well controlled. A1c targets should be individualized based on duration of diabetes, age, comorbid conditions, and other considerations. . This assay result is consistent with an increased risk of diabetes. . Currently, no consensus exists regarding use of hemoglobin A1c  for diagnosis of diabetes for children. .    Mean Plasma Glucose 120 mg/dL   eAG (mmol/L) 6.6 mmol/L  PSA     Status: None   Collection Time: 04/28/22 11:47 AM  Result Value Ref Range   PSA 0.43 < OR = 4.00 ng/mL    Comment: The total PSA value from this assay system is  standardized against the WHO standard. The test  result will be approximately 20% lower when compared  to the equimolar-standardized total PSA (Beckman  Coulter). Comparison of serial PSA results should be  interpreted with  this fact in mind. . This test was performed using the Siemens  chemiluminescent method. Values obtained from  different assay methods cannot be used interchangeably. PSA levels, regardless of value, should not be interpreted as absolute evidence of the presence or absence of disease.   TSH     Status: None   Collection Time: 04/28/22 11:47 AM  Result Value Ref Range   TSH 2.39 0.40 - 4.50 mIU/L  Vitamin B12     Status: None   Collection Time: 04/28/22 11:47 AM  Result Value Ref Range   Vitamin B-12 494 200 - 1,100 pg/mL  VITAMIN D 25 Hydroxy (Vit-D Deficiency, Fractures)     Status: Abnormal   Collection Time: 04/28/22 11:47 AM  Result Value Ref Range   Vit D, 25-Hydroxy 26 (L) 30 - 100 ng/mL    Comment: Vitamin D Status         25-OH Vitamin D: . Deficiency:                    <20 ng/mL Insufficiency:             20 - 29 ng/mL Optimal:                 > or = 30 ng/mL . For 25-OH Vitamin D testing on patients on  D2-supplementation and patients for whom quantitation  of D2 and D3 fractions is required, the QuestAssureD(TM) 25-OH VIT D, (D2,D3), LC/MS/MS is recommended: order  code [phone] (patients >52yr). . See Note 1 . Note 1 . For additional information, please refer to  http://education.[facility].com/faq/FAQ199  (This link is being provided for informational/ educational purposes only.)   T4, free     Status: None   Collection Time: 04/28/22 11:47 AM  Result Value Ref Range   Free T4 0.9 0.8 - 1.8 ng/dL  CBC with Differential     Status: None   Collection Time: 07/03/22  6:05 PM  Result Value Ref Range   WBC 9.4 4.0 - 10.5 K/uL   RBC 5.73 4.22 - 5.81 MIL/uL   Hemoglobin 15.7 13.0 - 17.0 g/dL   HCT 48.1 39.0 - 52.0 %   MCV 83.9 80.0 - 100.0 fL   MCH 27.4 26.0 - 34.0 pg   MCHC 32.6 30.0 - 36.0 g/dL   RDW 13.7 11.5 - 15.5 %   Platelets 297 150 - 400 K/uL   nRBC 0.0 0.0 - 0.2 %   Neutrophils Relative % 54 %   Neutro Abs 5.0 1.7 - 7.7 K/uL    Lymphocytes Relative 36 %   Lymphs Abs 3.4 0.7 - 4.0 K/uL   Monocytes Relative 6 %   Monocytes Absolute 0.6 0.1 - 1.0 K/uL   Eosinophils Relative 3 %   Eosinophils Absolute 0.3 0.0 - 0.5 K/uL   Basophils Relative 1 %   Basophils Absolute 0.1 0.0 - 0.1 K/uL  Immature Granulocytes 0 %   Abs Immature Granulocytes 0.04 0.00 - 0.07 K/uL    Comment: Performed at [facility], [address] [place], [place] [address]  Basic metabolic panel     Status: Abnormal   Collection Time: 07/03/22  6:05 PM  Result Value Ref Range   Sodium 137 135 - 145 mmol/L   Potassium 4.0 3.5 - 5.1 mmol/L   Chloride 104 98 - 111 mmol/L   CO2 25 22 - 32 mmol/L   Glucose, Bld 132 (H) 70 - 99 mg/dL    Comment: Glucose reference range applies only to samples taken after fasting for at least 8 hours.   BUN 15 6 - 20 mg/dL   Creatinine, Ser 1.10 0.61 - 1.24 mg/dL   Calcium 10.1 8.9 - 10.3 mg/dL   GFR, Estimated >60 >60 mL/min    Comment: (NOTE) Calculated using the CKD-EPI Creatinine Equation (2021)    Anion gap 8 5 - 15    Comment: Performed at [facility], [address] [place],  [address]    -------------------------------------------------------------------------- A&P:  Problem List Items Addressed This Visit   None Visit Diagnoses     Suspected sleep apnea    -  Primary   Excessive daytime sleepiness          Persistent clinical concern for suspected obstructive sleep apnea given reported symptoms with history of witnessed pnea, snoring and sleep disturbance, fatigue excessive sleepiness. - Screening: ESS score 12 / STOP-Bang Score 9 high score - Neck Circumference: >17" - Co-morbidities: HTN, Morbid Obesity  Plan: 1. Discussion on initial diagnosis and testing for OSA, risk factors, management, complications 2. Agree to proceed with sleep study testing based on clinical concerns - will fax referral to [place] - to arrange initial PSG home vs in  sleep lab and further evaluation.  Prior other lab work reviewed, negative for exact cause of fatigue sleepiness. 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   Follow-up: PRN  Patient verbalizes understanding with the above medical recommendations including the limitation of remote medical advice.  Specific follow-up and call-back criteria were given for patient to follow-up or seek medical care more urgently if needed.   - Time spent in direct consultation with patient on phone: 12 minutes   [name], [facility] 07/10/2022, 11:51 AM

## 2022-07-10 NOTE — Patient Instructions (Addendum)
Thank you for coming to the office today.  [facility] Address: [address] [place], [place], [place] [address] Phone: [phone]  Referral sent via fax today. Stay tuned to hear back from them, they should call you to schedule initial sleep study, likely a home test and then if abnormal - they will arrange the 2nd part of the sleep study in the lab with the CPAP machine.  If there is any issue with this company, for example not covered by insurance or other problem, please notify us and they may do so a well - we will need to change the location of the referral.  Future item can consider testosterone lab check, in the morning only.   Please schedule a Follow-up Appointment to: Return if symptoms worsen or fail to improve.  If you have any other questions or concerns, please feel free to call the office or send a message through [place]. You may also schedule an earlier appointment if necessary.  Additionally, you may be receiving a survey about your experience at our office within a few days to 1 week by e-mail or mail. We value your feedback.  [name], DO [place]

## 2022-07-23 ENCOUNTER — Other Ambulatory Visit: Payer: Self-pay | Admitting: Family Medicine

## 2022-07-23 DIAGNOSIS — K219 Gastro-esophageal reflux disease without esophagitis: Secondary | ICD-10-CM

## 2022-07-23 NOTE — Telephone Encounter (Signed)
Requested Prescriptions  Pending Prescriptions Disp Refills   omeprazole (PRILOSEC) 40 MG capsule [Pharmacy Med Name: OMEPRAZOLE DR 40 MG CAP] 90 capsule 0    Sig: TAKE 1 CAPSULE BY MOUTH ONCE DAILY     Gastroenterology: Proton Pump Inhibitors Passed - 07/23/2022  9:22 AM      Passed - Valid encounter within last 12 months    Recent Outpatient Visits           1 week ago Suspected sleep apnea   [place], DO   1 month ago Morbid obesity [facility])   [place], DO   2 months ago Prediabetes   [place], DO   11 months ago Mild persistent asthma without complication   [place], DO   1 year ago Annual physical exam   [place], Devonne Doughty, DO

## 2022-07-29 ENCOUNTER — Ambulatory Visit (INDEPENDENT_AMBULATORY_CARE_PROVIDER_SITE_OTHER): Payer: Commercial Managed Care - PPO | Admitting: Family Medicine

## 2022-07-29 ENCOUNTER — Encounter: Payer: Self-pay | Admitting: Family Medicine

## 2022-07-29 VITALS — Wt 266.0 lb

## 2022-07-29 DIAGNOSIS — R109 Unspecified abdominal pain: Secondary | ICD-10-CM

## 2022-07-29 MED ORDER — DICYCLOMINE HCL 10 MG PO CAPS: 10.0000 mg | ORAL_CAPSULE | Freq: Three times a day (TID) | ORAL | 2 refills | Status: DC

## 2022-07-29 NOTE — Progress Notes (Signed)
Virtual Visit via Telephone The purpose of this virtual visit is to provide medical care while limiting exposure to the novel coronavirus (COVID19) for both patient and office staff.  Consent was obtained for phone visit:  Yes.   Answered questions that patient had about telehealth interaction:  Yes.   I discussed the limitations, risks, security and privacy concerns of performing an evaluation and management service by telephone. I also discussed with the patient that there may be a patient responsible charge related to this service. The patient expressed understanding and agreed to proceed.  Patient Location: Home Provider Location: [facility] (Office)  Participants in virtual visit: - Patient: Derek Cook - CMA: Orinda Kenner, [place] - Provider: Dr Parks Ranger  ---------------------------------------------------------------------- Chief Complaint  Patient presents with   Abdominal Pain    S: Reviewed CMA documentation. I have called patient and gathered additional HPI as follows:  Abdominal Pain / Abdominal Cramping  Reports that symptoms started 1 week ago weekend Saturday, he admitted that while mowing lawn, he hit a rough part of lawn he was "bouncing around" and it caused lower abdomen pain. He started to feel a "knotted cramping pain" he said bowel movements were liquid only and not straining. He had more lower central pain below umbilicus and above groin.  Last few days he has not had much to eat during day and symptoms have improved. He ate a footlong BLT from [employer] and did not cause any pain. Last night he had salad with oil and vinegar and grilled chicken and felt decent, mild symptoms and then resolved.  This morning he had no pain at all with intestines. He did have well formed bowel movement.  Past history 10/03/20 with diarrhea - he was treated with dicyclomine PRN  Increased diet intake of meat / beef, popcorn in past 1 week thinks these could have  contributed  Prior Colonoscopy 2012, unable to determine if dx with diverticulosis   Denies any known or suspected exposure to person with or possibly with COVID19.  Denies any fevers, chills, sweats, body ache, cough, shortness of breath, sinus pain or pressure, headache  Past Medical History:  Diagnosis Date   Allergy    Asthma    Colon polyp    GERD (gastroesophageal reflux disease)    Hyperlipidemia    Social History   Tobacco Use   Smoking status: Former    Types: Cigarettes    Quit date: 09/07/2009    Years since quitting: 12.8   Smokeless tobacco: Former  Scientific laboratory technician Use: Never used  Substance Use Topics   Alcohol use: No   Drug use: No    Current Outpatient Medications:    albuterol (VENTOLIN HFA) 108 (90 Base) MCG/ACT inhaler, Inhale 2 puffs into the lungs every 4 (four) hours as needed for wheezing or shortness of breath., Disp: 8.5 g, Rfl: 3   dicyclomine (BENTYL) 10 MG capsule, Take 1 capsule (10 mg total) by mouth 4 (four) times daily -  before meals and at bedtime. As needed for abdominal cramping., Disp: 30 capsule, Rfl: 2   fluticasone-salmeterol (ADVAIR DISKUS) 500-50 MCG/ACT AEPB, Inhale 1 puff into the lungs in the morning and at bedtime., Disp: 60 each, Rfl: 11   omeprazole (PRILOSEC) 40 MG capsule, TAKE 1 CAPSULE BY MOUTH ONCE DAILY, Disp: 90 capsule, Rfl: 0   aspirin EC 81 MG tablet, Take 162 mg by mouth daily.  (Patient not taking: Reported on 07/10/2022), Disp: , Rfl:  tadalafil (CIALIS) 20 MG tablet, Take 1 tablet (20 mg total) by mouth every other day as needed for erectile dysfunction. (Patient not taking: Reported on 07/10/2022), Disp: 10 tablet, Rfl: 11     06/16/2022    9:55 AM 04/28/2022   10:43 AM 01/14/2021    1:19 PM  Depression screen PHQ 2/9  Decreased Interest 0 0 0  Down, Depressed, Hopeless 0 0 0  PHQ - 2 Score 0 0 0  Altered sleeping 0 0 0  Tired, decreased energy 0 0 0  Change in appetite 0 0 0  Feeling bad or failure  about yourself  0 0 0  Trouble concentrating 0 0 0  Moving slowly or fidgety/restless 0 0 0  Suicidal thoughts 0 0 0  PHQ-9 Score 0 0 0  Difficult doing work/chores Not difficult at all Not difficult at all Not difficult at all       06/16/2022    9:55 AM 04/28/2022   10:43 AM 01/14/2021    1:19 PM  GAD 7 : Generalized Anxiety Score  Nervous, Anxious, on Edge 0 0 0  Control/stop worrying 0 0 0  Worry too much - different things 0 0 0  Trouble relaxing 0 0 0  Restless 0 0 0  Easily annoyed or irritable 0 0 0  Afraid - awful might happen 0 0 0  Total GAD 7 Score 0 0 0  Anxiety Difficulty Not difficult at all Not difficult at all Not difficult at all    -------------------------------------------------------------------------- O: No physical exam performed due to remote telephone encounter.  Lab results reviewed.  Recent Results (from the past 2160 hour(s))  CBC with Differential     Status: None   Collection Time: 07/03/22  6:05 PM  Result Value Ref Range   WBC 9.4 4.0 - 10.5 K/uL   RBC 5.73 4.22 - 5.81 MIL/uL   Hemoglobin 15.7 13.0 - 17.0 g/dL   HCT 48.1 39.0 - 52.0 %   MCV 83.9 80.0 - 100.0 fL   MCH 27.4 26.0 - 34.0 pg   MCHC 32.6 30.0 - 36.0 g/dL   RDW 13.7 11.5 - 15.5 %   Platelets 297 150 - 400 K/uL   nRBC 0.0 0.0 - 0.2 %   Neutrophils Relative % 54 %   Neutro Abs 5.0 1.7 - 7.7 K/uL   Lymphocytes Relative 36 %   Lymphs Abs 3.4 0.7 - 4.0 K/uL   Monocytes Relative 6 %   Monocytes Absolute 0.6 0.1 - 1.0 K/uL   Eosinophils Relative 3 %   Eosinophils Absolute 0.3 0.0 - 0.5 K/uL   Basophils Relative 1 %   Basophils Absolute 0.1 0.0 - 0.1 K/uL   Immature Granulocytes 0 %   Abs Immature Granulocytes 0.04 0.00 - 0.07 K/uL    Comment: Performed at [facility], [address] [place], [place] [address]  Basic metabolic panel     Status: Abnormal   Collection Time: 07/03/22  6:05 PM  Result Value Ref Range   Sodium 137 135 - 145 mmol/L   Potassium 4.0 3.5  - 5.1 mmol/L   Chloride 104 98 - 111 mmol/L   CO2 25 22 - 32 mmol/L   Glucose, Bld 132 (H) 70 - 99 mg/dL    Comment: Glucose reference range applies only to samples taken after fasting for at least 8 hours.   BUN 15 6 - 20 mg/dL   Creatinine, Ser 1.10 0.61 - 1.24 mg/dL   Calcium  10.1 8.9 - 10.3 mg/dL   GFR, Estimated >60 >60 mL/min    Comment: (NOTE) Calculated using the CKD-EPI Creatinine Equation (2021)    Anion gap 8 5 - 15    Comment: Performed at [facility], [address] [place], [place] [address]    -------------------------------------------------------------------------- A&P:  Problem List Items Addressed This Visit   None Visit Diagnoses     Abdominal cramping    -  Primary   Relevant Medications   dicyclomine (BENTYL) 10 MG capsule      Functional bowel symptoms with abdominal pain (lower central) abdominal cramping Suspect may have flare possible diverticulitis with diet changes with inc meat / popcorn recently, otherwise may actually just be more constipation and bowel cramping IBS like symptoms with functional bowel, similar issue in 2022, resolved w/ dicyclomine AS NEEDED  Now his bowels seem to have improved in past several days, with more regular BM now, no further symptoms or pain or cramping  Will re order Dicyclomine AS NEEDED No further imaging or testing required today Further follow up as indicated. Diet modifications   Meds ordered this encounter  Medications   dicyclomine (BENTYL) 10 MG capsule    Sig: Take 1 capsule (10 mg total) by mouth 4 (four) times daily -  before meals and at bedtime. As needed for abdominal cramping.    Dispense:  30 capsule    Refill:  2    Follow-up: - Return in AS NEEDED  Patient verbalizes understanding with the above medical recommendations including the limitation of remote medical advice.  Specific follow-up and call-back criteria were given for patient to follow-up or seek medical care more  urgently if needed.   - Time spent in direct consultation with patient on phone: 12 minutes   [name], [facility] 07/29/2022, 11:47 AM

## 2022-08-14 ENCOUNTER — Encounter: Payer: Self-pay | Admitting: Internal Medicine

## 2022-08-14 DIAGNOSIS — G4719 Other hypersomnia: Secondary | ICD-10-CM

## 2022-08-19 NOTE — Procedures (Signed)
[facility] Report Part I                                                                 Phone: [phone] Fax: [phone]  Patient Name: Derek Cook, Derek Cook. Acquisition Number: [id number]  Date of Birth: [birth year] Acquisition Date: 08/14/2022  Referring Physician: Beatrix Shipper     History: The patient is a 61 year old male who was referred for evaluation of possible sleep apnea with snoring and sleepiness. Medical History: Excessive daytime sleepiness,Fatigue, headaches, obesity, allergy, colon polyp, hyperlipidemia, pre-diabetes  Medications: albuterol, fluticasone-salmeterol, omeprazole, aspirin, tadalafil  Procedure: This routine overnight polysomnogram was performed on the Alice 4 or 5 using the standard diagnostic protocol. This included 6 channels of EEG, 2 channels of EOG, chin EMG, bilateral anterior tibialis EMG, nasal/oral thermister, PTAF (nasal pressure transducer), chest and abdominal wall movements, EKG, pulse oximetry and EtCO2 when appropriate.  Description: The total recording time was 446.0 minutes. The total sleep time was 408.5 minutes. There were a total of 30.0 minutes of wakefulness after sleep onset for a slightly reducedsleep efficiency of 91.6%. The latency to sleep onset was short at 7.5 minutes. The R sleep onset latency was within normal limits at 103.5 minutes. Sleep parameters, as a percentage of the total sleep time, demonstrated 5.4% of sleep was in N1 sleep, 77.2% N2, 0.0% N3 and 17.4% R sleep. There were a total of 20 arousals for an arousal index of 2.9 arousals per hour of sleep that was normal.  Respiratory monitoring demonstrated intermittent mild to moderate degree of snoring in all positions. There were 94 apneas and hypopneas for an Apnea Hypopnea Index of 13.8 apneas and hypopneas per hour of sleep. The REM related apnea hypopnea index was 23.7/hr of REM sleep compared to a NREM AHI of 11.7/hr. The Respiratory  Disturbance Index, which includes 1 respiratory effort related arousals (RERAs), was 14.0 respiratory events per hour of sleep.  The average duration of the respiratory events was 26.5 seconds with a maximum duration of 61.0 seconds. The respiratory events occurred in most positions. The respiratory events were associated with peripheral oxygen desaturations on the average to 92%. The lowest oxygen desaturation associated with a respiratory event was 82%. Additionally, the baseline oxygen saturation during wakefulness was 96%, during NREM sleep averaged 95%, and during REM sleep averaged  94%. The total duration of oxygen < 90% was 2.8 minutes.  Cardiac monitoring- did not demonstrate transient cardiac decelerations associated with the apneas. There were no significant cardiac rhythm irregularities.   Periodic limb movement monitoring- demonstrated that there were 19 periodic limb movements for a periodic limb movement index of 2.8 periodic limb movements per hour of sleep.   Impression: This routine overnight polysomnogram demonstrated significant obstructive sleep apnea with an overall Apnea Hypopnea Index of 13.8 apneas and hypopneas per hour of sleep, while the respiratory disturbance index, which includes RERAs, was 14.0/hr. The respiratory events were associated with peripheral oxygen desaturations on the average to 92% with the lowest desaturation to 82%.    There were few periodic limb movements that commonly are not significant. Clinical correlation would be suggested.   There was a  slightly reduced sleep efficiencyfailure to progress into the deeper stages of  sleepThese findings would appear to be due to the obstructive sleep apnea.  Recommendations:    A CPAP titration would be recommended due to the severity of the sleep apnea.   Allyne Gee, MD [facility] Diplomate ABMS Pulmonary Critical Care Medicine Sleep Medicine Electronically reviewed and digitally signed    [facility] Report Part II  Phone: [phone] Fax: [phone]  Patient last name [place] Neck Size 17.5   in. Acquisition [id number]  Patient first name Derek Cook. Weight 255.0 lbs. Started 08/14/2022 at 9:26:43 PM  Birth date Jan 28, 1961 Height 68.0 in. Stopped 08/15/2022 at 5:02:49 AM  Age 29 BMI 38.8 lb/in2 Duration 446.0  Study Type Adult      Rod Holler - RPSGT, Margaretmary Eddy Sleep Data: Lights Out: 9:33:43 PM Sleep Onset: 9:41:13 PM  Lights On: 4:59:43 AM Sleep Efficiency: 91.6 %  Total Recording Time: 446.0 min Sleep Latency (from Lights Off) 7.5 min  Total Sleep Time (TST): 408.5 min R Latency (from Sleep Onset): 103.5 min  Sleep Period Time: 435.5 min Total number of awakenings: 10  Wake during sleep: 27.0 min Wake After Sleep Onset (WASO): 30.0 min   Sleep Data:         Arousal Summary: Stage  Latency from lights out (min) Latency from sleep onset (min) Duration (min) % Total Sleep Time  Normal values  N 1 7.5 0.0 22.0 5.4 (5%)  N 2 15.5 8.0 315.5 77.2 (50%)  N 3       0.0 0.0 (20%)  R 111.0 103.5 71.0 17.4 (25%)    Number Index  Spontaneous 19 2.8  Apneas & Hypopneas 1 0.1  RERAs 1 0.1       (Apneas & Hypopneas & RERAs)  (2) (0.3)  Limb Movement 0 0.0  Snore 0 0.0  TOTAL 21 3.1     Respiratory Data:  CA OA MA Apnea Hypopnea* A+ H RERA Total  Number 0 2 0 2 92 94 1 95  Mean Dur (sec) 0.0 17.3 0.0 17.3 26.7 26.5 22.0 26.5  Max Dur (sec) 0.0 18.0 0.0 18.0 61.0 61.0 22.0 61.0  Total Dur (min) 0.0 0.6 0.0 0.6 41.0 41.6 0.4 41.9  % of TST 0.0 0.1 0.0 0.1 10.0 10.2 0.1 10.3  Index (#/h TST) 0.0 0.3 0.0 0.3 13.5 13.8 0.1 14.0  *Hypopneas scored based on 4% or greater desaturation.  Sleep Stage:     Body Position Data:   REM NREM TST  AHI 23.7 11.7 13.8  RDI 24.5 11.7 14.0         Sleep (min) TST (%) REM (min) NREM (min) CA (#) OA (#) MA (#) HYP (#) AHI (#/h) RERA (#) RDI (#/h) Desat (#)  Supine 96.4 23.60 0.3 96.1 0 0 0 58 36.1 0  36.1 105  Non-Supine 312.10 76.40 70.70 241.40 0.00 2.00 0.00 34.00 6.92 1.00 7.11 89.00  Left: 198.1 48.49 65.2 132.9 0 0 0 29 8.8 0 8.8 58  Right: 114.0 27.91 5.5 108.5 0 2 0 5 3.7 1 4.2 31       Snoring: Total number of snoring episodes  0  Total time with snoring    min (   % of sleep)   Oximetry Distribution:             WK REM NREM TOTAL  Average (%)   96 94 95 95  < 90% 0.0 2.8 0.0 2.8  < 80% 0.0 0.0 0.0 0.0  <  70% 0.0 0.0 0.0 0.0  # of Desaturations* 3 45 147 195  Desat Index (#/hour) 4.8 38.0 26.1 28.6  Desat Max (%) '5 12 8 12  '$ Desat Max Dur (sec) 95.0 117.0 104.0 117.0  Approx Min O2 during sleep 82  Approx min O2 during a respiratory event 82  Was Oxygen added (Y/N) and final rate No:   0 LPM  *Desaturations based on 3% or greater drop from baseline.   Cheyne Stokes Breathing: None Present   Hypoventilation: None Present    Heart Rate Summary:  Average Heart Rate During Sleep 67.3 bpm      Highest Heart Rate During Sleep (95th %) 78.0 bpm      Highest Heart Rate During Sleep 173 bpm (artifact)  Highest Heart Rate During Recording (TIB) 199 bpm (artifact)   Heart Rate Observations: Event Type # Events   Bradycardia 0 Lowest HR Scored: N/A  Sinus Tachycardia During Sleep 0 Highest HR Scored: N/A  Narrow Complex Tachycardia 0 Highest HR Scored: N/A  Wide Complex Tachycardia 0 Highest HR Scored: N/A  Asystole 0 Longest Pause: N/A  Atrial Fibrillation 0 Duration Longest Event: N/A  Other Arrythmias  No Type:    Periodic Limb Movement Data: (Primary legs unless otherwise noted) Total # Limb Movement 19 Limb Movement Index 2.8  Total # PLMS 19 PLMS Index 2.8  Total # PLMS Arousals    PLMS Arousal Index     Percentage Sleep Time with PLMS 10.75mn (2.5 % sleep)  Mean Duration limb movements (secs) 202.3

## 2022-09-21 ENCOUNTER — Ambulatory Visit (INDEPENDENT_AMBULATORY_CARE_PROVIDER_SITE_OTHER): Payer: Commercial Managed Care - PPO | Admitting: Internal Medicine

## 2022-09-21 ENCOUNTER — Encounter: Payer: Self-pay | Admitting: Internal Medicine

## 2022-09-21 VITALS — BP 126/82 | HR 105 | Temp 97.3°F | Wt 254.0 lb

## 2022-09-21 DIAGNOSIS — R0602 Shortness of breath: Secondary | ICD-10-CM | POA: Diagnosis not present

## 2022-09-21 DIAGNOSIS — U071 COVID-19: Secondary | ICD-10-CM

## 2022-09-21 DIAGNOSIS — R059 Cough, unspecified: Secondary | ICD-10-CM

## 2022-09-21 DIAGNOSIS — R6883 Chills (without fever): Secondary | ICD-10-CM | POA: Diagnosis not present

## 2022-09-21 LAB — POCT INFLUENZA A/B
Influenza A, POC: NEGATIVE
Influenza B, POC: NEGATIVE

## 2022-09-21 LAB — POC COVID19 BINAXNOW: SARS Coronavirus 2 Ag: POSITIVE — AB

## 2022-09-21 MED ORDER — PREDNISONE 10 MG PO TABS: ORAL_TABLET | ORAL | 0 refills | Status: DC

## 2022-09-21 MED ORDER — PROMETHAZINE-DM 6.25-15 MG/5ML PO SYRP: 5.0000 mL | ORAL_SOLUTION | Freq: Four times a day (QID) | ORAL | 0 refills | Status: DC | PRN

## 2022-09-21 NOTE — Progress Notes (Signed)
Subjective:    Patient ID: Derek Cook, male    DOB: [birth year], 62 y.o.   MRN: [id number]  HPI  Patient presents to clinic today with complaint of runny nose, post nasal drip, cough and shortness of breath.  This started 09/18/2022. He is blowing clear mucous out of his nose. The cough is productive of yellow mucus. He denies headache, nasal congestion, ear pain, sore throat, chest pain, nausea, vomiting or diarrhea. He has had chills but denies fever or body aches. He does have a history of asthma, managed on Advair and Albuterol. He has tried Flonase OTC with some relief of symptoms. He has not had sick contacts that he is aware of.   Review of Systems  Past Medical History:  Diagnosis Date   Allergy    Asthma    Colon polyp    GERD (gastroesophageal reflux disease)    Hyperlipidemia     Current Outpatient Medications  Medication Sig Dispense Refill   albuterol (VENTOLIN HFA) 108 (90 Base) MCG/ACT inhaler Inhale 2 puffs into the lungs every 4 (four) hours as needed for wheezing or shortness of breath. 8.5 g 3   aspirin EC 81 MG tablet Take 162 mg by mouth daily.  (Patient not taking: Reported on 07/10/2022)     dicyclomine (BENTYL) 10 MG capsule Take 1 capsule (10 mg total) by mouth 4 (four) times daily -  before meals and at bedtime. As needed for abdominal cramping. 30 capsule 2   fluticasone-salmeterol (ADVAIR DISKUS) 500-50 MCG/ACT AEPB Inhale 1 puff into the lungs in the morning and at bedtime. 60 each 11   omeprazole (PRILOSEC) 40 MG capsule TAKE 1 CAPSULE BY MOUTH ONCE DAILY 90 capsule 0   tadalafil (CIALIS) 20 MG tablet Take 1 tablet (20 mg total) by mouth every other day as needed for erectile dysfunction. (Patient not taking: Reported on 07/10/2022) 10 tablet 11   No current facility-administered medications for this visit.    Allergies  Allergen Reactions   Statins Other (See Comments)    myalgias   Azithromycin Hives   Shellfish-Derived Products    Sulfa  Antibiotics     Family History  Problem Relation Age of Onset   Cancer Mother        melanoma   Diabetes Mother    Hyperlipidemia Mother    Dementia Mother    Cancer Father    Cancer Sister    Heart disease Sister    Diabetes Brother    Hyperlipidemia Brother    Dementia Paternal Grandmother     Social History   Socioeconomic History   Marital status: Married    Spouse name: Not on file   Number of children: Not on file   Years of education: Not on file   Highest education level: Not on file  Occupational History   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)  Tobacco Use   Smoking status: Former    Types: Cigarettes    Quit date: 09/07/2009    Years since quitting: 13.0   Smokeless tobacco: Former  Scientific laboratory technician Use: Never used  Substance and Sexual Activity   Alcohol use: No   Drug use: No   Sexual activity: Not on file  Other Topics Concern   Not on file  Social History Narrative   Not on file   Social Determinants of Health   Financial Resource Strain: Not on file  Food  Insecurity: Not on file  Transportation Needs: Not on file  Physical Activity: Not on file  Stress: Not on file  Social Connections: Not on file  Intimate Partner Violence: Not on file     Constitutional: Patient reports chills.  Denies fever, malaise, fatigue, or abrupt weight changes.  HEENT: Patient reports runny nose and postnasal drip.  Denies eye pain, eye redness, ear pain, ringing in the ears, wax buildup, runny nose, nasal congestion, bloody nose, or sore throat. Respiratory: Patient reports cough and shortness of breath.  Denies difficulty breathing.   Cardiovascular: Denies chest pain, chest tightness, palpitations or swelling in the hands or feet.  Gastrointestinal: Denies abdominal pain, bloating, constipation, diarrhea or blood in the stool.   No other specific complaints in a complete review of systems (except as listed in  HPI above).     Objective:   Physical Exam  BP 126/82 (BP Location: Left Arm, Patient Position: Sitting, Cuff Size: Large)   Pulse (!) 105   Temp (!) 97.3 F (36.3 C) (Temporal)   Wt 254 lb (115.2 kg)   SpO2 97%   BMI 38.62 kg/m   Wt Readings from Last 3 Encounters:  07/29/22 266 lb (120.7 kg)  07/10/22 266 lb (120.7 kg)  07/03/22 266 lb (120.7 kg)    General: Appears his stated age, obese, in NAD. Skin: Warm, dry and intact.  HEENT: Head: normal shape and size; Eyes: sclera white, no icterus, conjunctiva pink, PERRLA and EOMs intact; Throat/Mouth: Teeth present, mucosa erythematous and moist, no exudate, lesions or ulcerations noted.  Neck: No adenopathy noted. Cardiovascular: Tachycardic with normal rhythm. S1,S2 noted.  No murmur, rubs or gallops noted.  Pulmonary/Chest: Normal effort and positive vesicular breath sounds with intermittent expiratory wheeze. No respiratory distress. No rales or ronchi noted.  Musculoskeletal: No difficulty with gait.  Neurological: Alert and oriented.    BMET    Component Value Date/Time   NA 137 07/03/2022 1805   NA 138 07/26/2014 1940   K 4.0 07/03/2022 1805   K 4.1 07/26/2014 1940   CL 104 07/03/2022 1805   CL 105 07/26/2014 1940   CO2 25 07/03/2022 1805   CO2 26 07/26/2014 1940   GLUCOSE 132 (H) 07/03/2022 1805   GLUCOSE 111 (H) 07/26/2014 1940   BUN 15 07/03/2022 1805   BUN 13 07/26/2014 1940   CREATININE 1.10 07/03/2022 1805   CREATININE 1.17 04/28/2022 1147   CALCIUM 10.1 07/03/2022 1805   CALCIUM 9.4 07/26/2014 1940   GFRNONAA >60 07/03/2022 1805   GFRNONAA 83 01/07/2021 0830   GFRAA 96 01/07/2021 0830    Lipid Panel     Component Value Date/Time   CHOL 330 (H) 04/28/2022 1147   TRIG 623 (H) 04/28/2022 1147   HDL 46 04/28/2022 1147   CHOLHDL 7.2 (H) 04/28/2022 1147   VLDL NOT CALC 06/25/2016 1440   LDLCALC  04/28/2022 1147     Comment:     . LDL cholesterol not calculated. Triglyceride levels greater than  400 mg/dL invalidate calculated LDL results. . Reference range: <100 . Desirable range <100 mg/dL for primary prevention;   <70 mg/dL for patients with CHD or diabetic patients  with > or = 2 CHD risk factors. Marland Kitchen LDL-C is now calculated using the Martin-Hopkins  calculation, which is a validated novel method providing  better accuracy than the Friedewald equation in the  estimation of LDL-C.  Cresenciano Genre et al. Annamaria Helling. 4081;448(18): 2061-2068  (http://education.[facility].com/faq/FAQ164)     CBC  Component Value Date/Time   WBC 9.4 07/03/2022 1805   RBC 5.73 07/03/2022 1805   HGB 15.7 07/03/2022 1805   HGB 15.7 07/26/2014 1940   HCT 48.1 07/03/2022 1805   HCT 46.8 07/26/2014 1940   PLT 297 07/03/2022 1805   PLT 302 07/26/2014 1940   MCV 83.9 07/03/2022 1805   MCV 83 07/26/2014 1940   MCH 27.4 07/03/2022 1805   MCHC 32.6 07/03/2022 1805   RDW 13.7 07/03/2022 1805   RDW 14.0 07/26/2014 1940   LYMPHSABS 3.4 07/03/2022 1805   MONOABS 0.6 07/03/2022 1805   EOSABS 0.3 07/03/2022 1805   BASOSABS 0.1 07/03/2022 1805    Hgb A1C Lab Results  Component Value Date   HGBA1C 5.8 (H) 04/28/2022            Assessment & Plan:   Chills, COVID-19:  Rapid flu negative Rapid COVID positive  Encourage rest and fluids Discussed Paxlovid, risk and benefits-given his mild symptoms he opted to hold off on Paxlovid at this time He is requesting Ivermectin which I advised him I do not prescribe Rx for Pred taper for symptom management Rx for Promethazine DM cough syrup Work note provided   Follow-up with your PCP as previously scheduled Webb Silversmith, NP

## 2022-09-21 NOTE — Patient Instructions (Signed)

## 2022-11-24 ENCOUNTER — Other Ambulatory Visit: Payer: Self-pay | Admitting: Family Medicine

## 2022-11-24 DIAGNOSIS — K219 Gastro-esophageal reflux disease without esophagitis: Secondary | ICD-10-CM

## 2022-11-25 NOTE — Telephone Encounter (Signed)
Requested Prescriptions  Pending Prescriptions Disp Refills   omeprazole (PRILOSEC) 40 MG capsule [Pharmacy Med Name: OMEPRAZOLE DR 40 MG CAP] 90 capsule 0    Sig: TAKE 1 CAPSULE BY MOUTH ONCE DAILY     Gastroenterology: Proton Pump Inhibitors Passed - 11/24/2022  2:07 PM      Passed - Valid encounter within last 12 months    Recent Outpatient Visits           2 months ago [facility], Coralie Keens, NP   3 months ago Abdominal cramping   [facility] Olin Hauser, DO   4 months ago Suspected sleep apnea   [facility] Olin Hauser, DO   5 months ago Morbid obesity [facility])   [facility] Olin Hauser, DO   7 months ago Prediabetes   [facility], Devonne Doughty, [place]

## 2023-01-18 ENCOUNTER — Telehealth: Payer: Self-pay | Admitting: Family Medicine

## 2023-01-18 NOTE — Telephone Encounter (Signed)
Pt is calling to request an EOB for his appt tomorrow. Pt has no insurance.  CB- [phone]

## 2023-01-19 ENCOUNTER — Encounter: Payer: Self-pay | Admitting: Internal Medicine

## 2023-01-19 ENCOUNTER — Ambulatory Visit (INDEPENDENT_AMBULATORY_CARE_PROVIDER_SITE_OTHER): Payer: Self-pay | Admitting: Internal Medicine

## 2023-01-19 VITALS — BP 134/70 | HR 101 | Temp 96.6°F | Wt 261.0 lb

## 2023-01-19 DIAGNOSIS — W57XXXA Bitten or stung by nonvenomous insect and other nonvenomous arthropods, initial encounter: Secondary | ICD-10-CM

## 2023-01-19 DIAGNOSIS — S70261A Insect bite (nonvenomous), right hip, initial encounter: Secondary | ICD-10-CM

## 2023-01-19 MED ORDER — DOXYCYCLINE HYCLATE 100 MG PO TABS: 100.0000 mg | ORAL_TABLET | Freq: Two times a day (BID) | ORAL | 0 refills | Status: DC

## 2023-01-19 NOTE — Patient Instructions (Signed)
Tick Bite Information, Adult  Ticks are insects that draw blood for food. They climb onto people and animals that brush against the leaves and grasses that they live in. They then bite and attach to the skin. Most ticks are harmless, but some ticks may carry germs that can cause disease. These germs are spread to a person through a bite. To lower your risk of getting a disease from a tick bite, make sure you: Take steps to prevent tick bites. Check for ticks after being outdoors where ticks live. Watch for symptoms of disease if a tick attached to you or if you think a tick bit you. How can I prevent tick bites? Take these steps to help prevent tick bites when you go outdoors in an area where ticks live: Before you go outdoors: Wear long sleeves and long pants to protect your skin from ticks. Wear light-colored clothing so you can see ticks easier. Tuck your pant legs into your socks. Apply insect repellent that has DEET (20% or higher), picaridin, or IR3535 in it to the following areas: Any bare skin. Avoid areas around the eyes and mouth. Edges of clothing, like the top of your boots, the bottom of your pant legs, and your sleeve cuffs. Consider applying an insect repellant that contains permethrin. Follow the instructions on the label. Do not apply permethrin directly to the skin. Instead, apply to the following areas: Clothing and shoes. Outdoor gear and tents. When you are outdoors: Avoid walking through areas with long grass. If you are walking on a trail, stay in the middle of the trail so your skin, hair, and clothing do not touch the bushes. Check for ticks on your clothing, hair, and skin often while you are outdoors. Check again before you go inside. When you go indoors: Check your clothing for ticks. Tumble dry clothes in a dryer on high heat for at least 10 minutes. If clothes are damp, additional time may be needed. If clothes require washing, use hot water. Check your gear and  pets. Shower soon after being outdoors. Check your body for ticks. Do a full body check using a mirror. Be sure to check your scalp, neck, armpits, waist, groin, and joint areas. These are the spots where ticks attach themselves most often. What is the best way to remove a tick?  Remove the tick as soon as possible. Removing it can prevent germs from passing to your body. Do not remove the tick with your bare fingers. Do not try to remove a tick with heat, alcohol, petroleum jelly, or fingernail polish. These things can cause the tick to salivate and regurgitate into your bloodstream, increasing your risk of getting a disease. To remove a tick that is crawling on your skin: Go outside and brush the tick off. Use tape or a lint roller. To remove a tick that is attached to your skin: Wash your hands. If you have gloves, put them on. Use a fine-tipped tweezer, curved forceps, or a tick-removal tool to gently grasp the tick as close to your skin and the tick's head as possible. Gently pull with a steady, upward, and even pressure until the tick lets go. While removing the tick: Take care to keep the tick's head attached to its body. Do not twist or jerk the tick. This can make the tick's head or mouth parts break off and stay in your skin. If this happens, try to remove the mouth parts with tweezers. If you cannot remove them, leave   the area alone and let the skin heal. Do not squeeze or crush the tick's body. This could force disease-carrying fluids from the tick into your body. What should I do after removing a tick? Clean the bite area and your hands with soap and water, rubbing alcohol, or an iodine scrub. If an antiseptic cream or ointment is available, put a small amount on the bite area. Wash and disinfect any tools that you used to remove the tick. How should I dispose of a tick? To dispose of a live tick, use one of these methods: Place it in rubbing alcohol. Place it in a sealed bag  or container, and throw it away. Wrap it tightly in tape, and throw it away. Flush it down the toilet. Where to find more information Centers for Disease Control and Prevention: cdc.gov/ticks [place] Environmental Protection Agency: epa.gov/insect-repellents Contact a health care provider if: You have symptoms of a disease after a tick bite. Symptoms of a tick-borne disease can occur from moments after the tick bites to 30 days after a tick is removed. Symptoms include: Fever or chills. A red rash that makes a circle (bull's-eye rash) in the bite area. Redness and swelling in the bite area. Headache or stiff neck. Muscle, joint, or bone pain. Abnormal tiredness. Numbness in your legs or trouble walking or moving your legs. Tender or swollen lymph glands. Abdominal pain, vomiting, diarrhea, or weight loss. Get help right away if: You are not able to remove a tick. You have muscle weakness or paralysis. Your symptoms get worse or you experience new symptoms. You find an engorged tick on your skin and you are in an area where there is a higher risk of disease from ticks. Summary Ticks may carry germs that can spread to a person through a bite. These germs can cause disease. Wear protective clothing and use insect repellent to prevent tick bites. Follow the instructions on the label. If you find a tick on your body, remove it as soon as possible. If the tick is attached, do not try to remove it with heat, alcohol, petroleum jelly, or fingernail polish. If you have symptoms of a disease after being bitten by a tick, contact a health care provider. This information is not intended to replace advice given to you by your health care provider. Make sure you discuss any questions you have with your health care provider. Document Revised: 11/24/2021 Document Reviewed: 11/24/2021 Elsevier Patient Education  2023 Elsevier Inc.  

## 2023-01-19 NOTE — Progress Notes (Signed)
Subjective:    Patient ID: Derek Cook, male    DOB: 02/02/1961, 62 y.o.   MRN: [id number]  HPI  Patient presents to clinic today with complaint of a insect bite of the right hip.  This occurred 5 days ago.  He thinks it may have been a tick because he found a tick on his neck earlier in the day.  He did not physically pull a tick off of his hip.  He reports that area has itched.  He has noted some redness around the area that has seemed to improved with topical antibiotic ointment.  He is requesting antibiotics to prevent Lyme disease.  Review of Systems     Past Medical History:  Diagnosis Date   Allergy    Asthma    Colon polyp    GERD (gastroesophageal reflux disease)    Hyperlipidemia     Current Outpatient Medications  Medication Sig Dispense Refill   albuterol (VENTOLIN HFA) 108 (90 Base) MCG/ACT inhaler Inhale 2 puffs into the lungs every 4 (four) hours as needed for wheezing or shortness of breath. 8.5 g 3   aspirin EC 81 MG tablet Take 162 mg by mouth daily.  (Patient not taking: Reported on 07/10/2022)     dicyclomine (BENTYL) 10 MG capsule Take 1 capsule (10 mg total) by mouth 4 (four) times daily -  before meals and at bedtime. As needed for abdominal cramping. 30 capsule 2   fluticasone-salmeterol (ADVAIR DISKUS) 500-50 MCG/ACT AEPB Inhale 1 puff into the lungs in the morning and at bedtime. 60 each 11   omeprazole (PRILOSEC) 40 MG capsule TAKE 1 CAPSULE BY MOUTH ONCE DAILY 90 capsule 0   predniSONE (DELTASONE) 10 MG tablet Take 6 tabs on day 1, 5 tabs on day 2, 4 tabs on day 3, 3 tabs on day 4, 2 tabs on day 5, 1 tab on day 6 21 tablet 0   promethazine-dextromethorphan (PROMETHAZINE-DM) 6.25-15 MG/5ML syrup Take 5 mLs by mouth 4 (four) times daily as needed. 118 mL 0   tadalafil (CIALIS) 20 MG tablet Take 1 tablet (20 mg total) by mouth every other day as needed for erectile dysfunction. (Patient not taking: Reported on 07/10/2022) 10 tablet 11   No current  facility-administered medications for this visit.    Allergies  Allergen Reactions   Statins Other (See Comments)    myalgias   Azithromycin Hives   Shellfish-Derived Products    Sulfa Antibiotics     Family History  Problem Relation Age of Onset   Cancer Mother        melanoma   Diabetes Mother    Hyperlipidemia Mother    Dementia Mother    Cancer Father    Cancer Sister    Heart disease Sister    Diabetes Brother    Hyperlipidemia Brother    Dementia Paternal Grandmother     Social History   Socioeconomic History   Marital status: Married    Spouse name: Not on file   Number of children: Not on file   Years of education: Not on file   Highest education level: Not on file  Occupational History   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)  Tobacco Use   Smoking status: Former    Types: Cigarettes    Quit date: 09/07/2009    Years since quitting: 13.3   Smokeless tobacco: Former  Building services engineer Use: Never used  Substance and Sexual Activity   Alcohol use: No   Drug use: No   Sexual activity: Not on file  Other Topics Concern   Not on file  Social History Narrative   Not on file   Social Determinants of Health   Financial Resource Strain: Not on file  Food Insecurity: Not on file  Transportation Needs: Not on file  Physical Activity: Not on file  Stress: Not on file  Social Connections: Not on file  Intimate Partner Violence: Not on file     Constitutional: Pt reports fatigue. Denies fever, malaise, headache or abrupt weight changes.  Respiratory: Denies difficulty breathing, shortness of breath, cough or sputum production.   Cardiovascular: Denies chest pain, chest tightness, palpitations or swelling in the hands or feet.  Gastrointestinal: Denies abdominal pain, bloating, constipation, diarrhea or blood in the stool.  Musculoskeletal: Denies decrease in range of motion, difficulty with gait, muscle  pain or joint pain and swelling.  Skin: Patient reports tick bite of right hip.  Denies rashes, lesions or ulcercations.  Neurological: Denies dizziness, difficulty with memory, difficulty with speech or problems with balance and coordination.    No other specific complaints in a complete review of systems (except as listed in HPI above).  Objective:   Physical Exam   BP 134/70 (BP Location: Right Arm, Patient Position: Sitting, Cuff Size: Large)   Pulse (!) 101   Temp (!) 96.6 F (35.9 C) (Temporal)   Wt 261 lb (118.4 kg)   SpO2 98%   BMI 39.68 kg/m   Wt Readings from Last 3 Encounters:  09/21/22 254 lb (115.2 kg)  07/29/22 266 lb (120.7 kg)  07/10/22 266 lb (120.7 kg)    General: Appears his stated age, obese, in NAD. Skin: 3 mm raised, scabbed papule noted at the right hip.  No surrounding redness, erythema or erythema migrans. HEENT: Head: normal shape and size; Eyes: sclera white, no icterus, conjunctiva pink, PERRLA and EOMs intact;  Cardiovascular: Tachycardic with normal rhythm.  Pulmonary/Chest: Normal effort and positive vesicular breath sounds. No respiratory distress. No wheezes, rales or ronchi noted.  Musculoskeletal: No difficulty with gait.  Neurological: Alert and oriented.    BMET    Component Value Date/Time   NA 137 07/03/2022 1805   NA 138 07/26/2014 1940   K 4.0 07/03/2022 1805   K 4.1 07/26/2014 1940   CL 104 07/03/2022 1805   CL 105 07/26/2014 1940   CO2 25 07/03/2022 1805   CO2 26 07/26/2014 1940   GLUCOSE 132 (H) 07/03/2022 1805   GLUCOSE 111 (H) 07/26/2014 1940   BUN 15 07/03/2022 1805   BUN 13 07/26/2014 1940   CREATININE 1.10 07/03/2022 1805   CREATININE 1.17 04/28/2022 1147   CALCIUM 10.1 07/03/2022 1805   CALCIUM 9.4 07/26/2014 1940   GFRNONAA >60 07/03/2022 1805   GFRNONAA 83 01/07/2021 0830   GFRAA 96 01/07/2021 0830    Lipid Panel     Component Value Date/Time   CHOL 330 (H) 04/28/2022 1147   TRIG 623 (H) 04/28/2022  1147   HDL 46 04/28/2022 1147   CHOLHDL 7.2 (H) 04/28/2022 1147   VLDL NOT CALC 06/25/2016 1440   LDLCALC  04/28/2022 1147     Comment:     . LDL cholesterol not calculated. Triglyceride levels greater than 400 mg/dL invalidate calculated LDL results. . Reference range: <100 . Desirable range <100 mg/dL for primary prevention;   <70 mg/dL for patients with CHD or diabetic patients  with > or = 2 CHD risk factors. Marland Kitchen LDL-C is now calculated using the Martin-Hopkins  calculation, which is a validated novel method providing  better accuracy than the Friedewald equation in the  estimation of LDL-C.  Horald Pollen et al. Lenox Ahr. 1610;960(45): 2061-2068  (http://education.[facility].com/faq/FAQ164)     CBC    Component Value Date/Time   WBC 9.4 07/03/2022 1805   RBC 5.73 07/03/2022 1805   HGB 15.7 07/03/2022 1805   HGB 15.7 07/26/2014 1940   HCT 48.1 07/03/2022 1805   HCT 46.8 07/26/2014 1940   PLT 297 07/03/2022 1805   PLT 302 07/26/2014 1940   MCV 83.9 07/03/2022 1805   MCV 83 07/26/2014 1940   MCH 27.4 07/03/2022 1805   MCHC 32.6 07/03/2022 1805   RDW 13.7 07/03/2022 1805   RDW 14.0 07/26/2014 1940   LYMPHSABS 3.4 07/03/2022 1805   MONOABS 0.6 07/03/2022 1805   EOSABS 0.3 07/03/2022 1805   BASOSABS 0.1 07/03/2022 1805    Hgb A1C Lab Results  Component Value Date   HGBA1C 5.8 (H) 04/28/2022           Assessment & Plan:   Tick Bite of Right Hip:  No evidence of local infection or erythema migrans Discussed that antibiotics are typically reserved for tick bites where the tick was engorged, on the patient for >24 hours with evidence of erythema migrans He is requesting to have Doxycycline anyway, Rx for Doxycycline 100 mg twice daily x 10 days   Follow-up with your PCP as previously scheduled Nicki Reaper, NP

## 2023-02-28 ENCOUNTER — Ambulatory Visit
Admission: EM | Admit: 2023-02-28 | Discharge: 2023-02-28 | Disposition: A | Discharge status: Home / Self Care | Payer: BC Managed Care – PPO | Attending: Physician Assistant | Admitting: Physician Assistant

## 2023-02-28 DIAGNOSIS — Z1152 Encounter for screening for COVID-19: Secondary | ICD-10-CM | POA: Diagnosis not present

## 2023-02-28 DIAGNOSIS — R0981 Nasal congestion: Secondary | ICD-10-CM | POA: Insufficient documentation

## 2023-02-28 DIAGNOSIS — R1030 Lower abdominal pain, unspecified: Secondary | ICD-10-CM | POA: Insufficient documentation

## 2023-02-28 DIAGNOSIS — R6883 Chills (without fever): Secondary | ICD-10-CM | POA: Diagnosis not present

## 2023-02-28 LAB — URINALYSIS, W/ REFLEX TO CULTURE (INFECTION SUSPECTED)
Bilirubin Urine: NEGATIVE
Glucose, UA: NEGATIVE mg/dL
Ketones, ur: NEGATIVE mg/dL
Nitrite: NEGATIVE
Protein, ur: NEGATIVE mg/dL
Specific Gravity, Urine: 1.025 (ref 1.005–1.030)
pH: 7 (ref 5.0–8.0)

## 2023-02-28 LAB — SARS CORONAVIRUS 2 BY RT PCR: SARS Coronavirus 2 by RT PCR: NEGATIVE

## 2023-02-28 NOTE — ED Provider Notes (Signed)
[facility]    CSN: [id number] Arrival date & time: 02/28/23  0865      History   Chief Complaint Chief Complaint  Patient presents with   Urinary Tract Infection    HPI Derek Cook is a 62 y.o. male presenting for lower abdominal pain (suprapubic and left lower quadrant) intermittent x 3 days. Denies diarrhea and reports reduced bowel movements, but has been going everyday. Denies painful urination, urgency or frequency. Reports reduced urine output.  Reports chills, sweats, fatigue, sinus pressure, headaches, runny nose, mild cough, sneezing that began yesterday.  No known fevers.  Denies chest pain or difficulty.  Denies sick contacts or known exposure to COVID but states last time he had chills and sweats about he did have COVID so he would like to be tested.  He has not been taking any OTC meds.  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 Authorizing Provider  omeprazole (PRILOSEC) 40 MG capsule TAKE 1 CAPSULE BY MOUTH ONCE DAILY 11/25/22  Yes Karamalegos, Netta Neat, DO  albuterol (VENTOLIN HFA) 108 (90 Base) MCG/ACT inhaler Inhale 2 puffs into the lungs every 4 (four) hours as needed for wheezing or shortness of breath. 04/28/22   Althea Charon, Netta Neat, DO  aspirin EC 81 MG tablet Take 162 mg by mouth daily.  Patient not taking: Reported on 07/10/2022    [provider]  dicyclomine (BENTYL) 10 MG capsule Take 1 capsule (10 mg total) by mouth 4 (four) times daily -  before meals and at bedtime. As needed for abdominal cramping. Patient not taking: Reported on 01/19/2023 07/29/22   Smitty Cords, DO  doxycycline (VIBRA-TABS) 100 MG tablet Take 1 tablet (100 mg total) by mouth 2 (two) times daily. 01/19/23   Lorre Munroe, NP  fluticasone-salmeterol (ADVAIR DISKUS) 500-50 MCG/ACT AEPB Inhale 1 puff into the lungs in the morning and at bedtime. 04/28/22   Karamalegos, Netta Neat, DO  predniSONE (DELTASONE) 10 MG tablet Take 6 tabs on day 1, 5 tabs on day 2, 4 tabs on day 3, 3 tabs on day 4, 2 tabs on day 5, 1 tab on day 6 09/21/22   Lorre Munroe, NP  promethazine-dextromethorphan (PROMETHAZINE-DM) 6.25-15 MG/5ML syrup Take 5 mLs by mouth 4 (four) times daily as needed. 09/21/22   Lorre Munroe, NP  tadalafil (CIALIS) 20 MG tablet Take 1 tablet (20 mg total) by mouth every other day as needed for erectile dysfunction. Patient not taking: Reported on 07/10/2022 04/28/22   Smitty Cords, DO    Family History Family History  Problem Relation Age of Onset   Cancer Mother        melanoma   Diabetes Mother    Hyperlipidemia Mother    Dementia Mother    Cancer Father    Cancer Sister    Heart disease Sister    Diabetes Brother    Hyperlipidemia Brother    Dementia Paternal Grandmother     Social History Social History   Tobacco Use   Smoking status: Former     Types: Cigarettes    Quit date: 09/07/2009    Years since quitting: 13.4   Smokeless tobacco: Former  Building services engineer Use: Never used  Substance Use Topics   Alcohol use: No   Drug use: No     Allergies   Statins, Azithromycin, Shellfish-derived products, and Sulfa antibiotics   Review of Systems Review of Systems  Constitutional:  Positive for chills, diaphoresis and fatigue. Negative for appetite change and fever.  HENT:  Positive for congestion, rhinorrhea and sinus pressure. Negative for ear pain and sore throat.   Respiratory:  Positive for cough. Negative for shortness of breath.   Cardiovascular:  Negative for chest pain.  Gastrointestinal:  Positive for abdominal pain. Negative for abdominal distention, anal bleeding, blood in stool, constipation, diarrhea, nausea, rectal pain and vomiting.  Genitourinary:  Positive for decreased urine volume. Negative for difficulty urinating, dysuria, flank pain, frequency, genital sores, hematuria, penile discharge, penile pain, scrotal swelling and testicular pain.  Musculoskeletal:  Negative for back pain, gait problem and myalgias.  Neurological:  Positive for headaches. Negative for weakness.     Physical Exam Triage Vital Signs ED Triage Vitals  Enc Vitals Group     BP 02/28/23 0839 (!) 138/91     Pulse Rate 02/28/23 0839 93     Resp 02/28/23 0839 18     Temp 02/28/23 0839 98.2 F (36.8 C)     Temp Source 02/28/23 0839 Oral     SpO2 02/28/23 0839 94 %     Weight 02/28/23 0837 265 lb (120.2 kg)     Height --      Head Circumference --      Peak Flow --      Pain Score 02/28/23 0837 9     Pain Loc --      Pain Edu? --      Excl. in GC? --    No data found.  Updated Vital Signs BP (!) 138/91 (BP Location: Left Arm)   Pulse 93   Temp 98.2 F (36.8 C) (Oral)   Resp 18   Wt 265 lb (120.2 kg)   SpO2 94%   BMI 40.29 kg/m     Physical Exam Vitals and nursing note reviewed.  Constitutional:      General: He is  not in acute distress.    Appearance: Normal appearance. He is well-developed. He is not ill-appearing.  HENT:     Head: Normocephalic and atraumatic.     Nose: Nose normal.     Mouth/Throat:     Mouth: Mucous membranes are moist.     Pharynx: Oropharynx is clear. Posterior oropharyngeal erythema (mildly injected) present.  Eyes:     General: No scleral icterus.    Conjunctiva/sclera: Conjunctivae  normal.  Cardiovascular:     Rate and Rhythm: Normal rate and regular rhythm.     Heart sounds: Normal heart sounds.  Pulmonary:     Effort: Pulmonary effort is normal. No respiratory distress.     Breath sounds: Normal breath sounds.  Abdominal:     Palpations: Abdomen is soft.     Tenderness: There is abdominal tenderness (suprapubic, LLQ). There is no right CVA tenderness, left CVA tenderness, guarding or rebound.  Musculoskeletal:     Cervical back: Neck supple.  Skin:    General: Skin is warm and dry.     Capillary Refill: Capillary refill takes less than 2 seconds.  Neurological:     General: No focal deficit present.     Mental Status: He is alert. Mental status is at baseline.     Motor: No weakness.     Gait: Gait normal.  Psychiatric:        Mood and Affect: Mood normal.        Behavior: Behavior normal.      UC Treatments / Results  Labs (all labs ordered are listed, but only abnormal results are displayed) Labs Reviewed  URINALYSIS, W/ REFLEX TO CULTURE (INFECTION SUSPECTED) - Abnormal; Notable for the following components:      Result Value   Hgb urine dipstick TRACE (*)    Leukocytes,Ua TRACE (*)    Bacteria, UA FEW (*)    All other components within normal limits  SARS CORONAVIRUS 2 BY RT PCR  URINE CULTURE    EKG   Radiology No results found.  Procedures Procedures (including critical care time)  Medications Ordered in UC Medications - No data to display  Initial Impression / Assessment and Plan / UC Course  I have reviewed the triage vital  signs and the nursing notes.  Pertinent labs & imaging results that were available during my care of the patient were reviewed by me and considered in my medical decision making (see chart for details).   63 year old male presents for suprapubic and left lower quadrant intermittent pain, reduced urine output and reduced fecal output over the past 3 days.  Pain has not recently worsened.  No associated fever, vomiting, diarrhea, blood in stool.  Patient reporting fatigue, chills, sweats, mild cough, runny nose, scratchy throat that began last night.  Would like a COVID test.  Vitals are stable.  He is overall well-appearing.  On exam he has mild injection of posterior pharynx.  Mild tenderness palpation of the suprapubic region and left lower quadrant.  No guarding, rebound or CVA tenderness.  Chest clear to auscultation.  Urinalysis shows trace hemoglobin and trace leukocytes.  Will send urine for culture but he does not have classic urinary symptoms so I have low concern for UTI.  This was his primary concern related to the abdominal pain.  Will treat for UTI if culture is positive.  Symptoms seem more likely related to constipation.  He declines a KUB and would like to go home and try stool softeners/laxatives.  Encouraged him to increase his fluids.  Reviewed going to ED for fever or worsening abdominal pain.  COVID test obtained due to URI symptoms.  Advised patient I will contact him with the results if positive.  Viral URI.  Supportive care encouraged with increased rest and fluids, OTC DayQuil/NyQuil, Tylenol, Motrin.  Reviewed return and ER precautions.  Negative COVID test.   Final Clinical Impressions(s) / UC Diagnoses   Final diagnoses:  Lower abdominal pain  Chills  Nasal congestion     Discharge Instructions      -I will call you if the COVID test is positive. - Your urine test does not appear to be consistent with a UTI.  I am sending it for culture and we will call you  if the culture is positive.  We will send you antibiotics if you do have bacteria in your urine.  That takes about 2 days. - Since you are upper respiratory symptoms just started, you may develop more symptoms such as worsening cough, increased congestion, sore throat, etc.  In that case you should treat your symptoms with OTC meds such as ibuprofen, Tylenol, throat lozenges, DayQuil/NyQuil. - Your abdominal pain may be related to constipation.  I suggest stool softener such as senna or MiraLAX.  Increase your fluids. - If abdominal pain worsens or you develop a fever please go to the ER.     ED Prescriptions   None    PDMP not reviewed this encounter.   Shirlee Latch, PA-C 02/28/23 1053

## 2023-02-28 NOTE — ED Triage Notes (Signed)
Poss UTI; Low abdomen pain x5 days. No burning or frequent urination. 9/10

## 2023-02-28 NOTE — Discharge Instructions (Signed)
-  I will call you if the COVID test is positive. - Your urine test does not appear to be consistent with a UTI.  I am sending it for culture and we will call you if the culture is positive.  We will send you antibiotics if you do have bacteria in your urine.  That takes about 2 days. - Since you are upper respiratory symptoms just started, you may develop more symptoms such as worsening cough, increased congestion, sore throat, etc.  In that case you should treat your symptoms with OTC meds such as ibuprofen, Tylenol, throat lozenges, DayQuil/NyQuil. - Your abdominal pain may be related to constipation.  I suggest stool softener such as senna or MiraLAX.  Increase your fluids. - If abdominal pain worsens or you develop a fever please go to the ER.

## 2023-03-01 LAB — URINE CULTURE: Culture: NO GROWTH

## 2023-03-18 ENCOUNTER — Other Ambulatory Visit: Payer: Self-pay | Admitting: Family Medicine

## 2023-03-18 DIAGNOSIS — J4530 Mild persistent asthma, uncomplicated: Secondary | ICD-10-CM

## 2023-03-19 NOTE — Telephone Encounter (Signed)
Requested Prescriptions  Pending Prescriptions Disp Refills   albuterol (VENTOLIN HFA) 108 (90 Base) MCG/ACT inhaler [Pharmacy Med Name: ALBUTEROL SULFATE HFA 108 (90 BASE)] 8.5 g 3    Sig: INHALE 2 PUFFS INTO THE LUNGA EVERY 4 HOURS AS NEEDED FOR WHEEZING OR SHORTNESS OF BREATH     Pulmonology:  Beta Agonists 2 Failed - 03/18/2023  5:18 PM      Failed - Last BP in normal range    BP Readings from Last 1 Encounters:  02/28/23 (!) 138/91         Passed - Last Heart Rate in normal range    Pulse Readings from Last 1 Encounters:  02/28/23 93         Passed - Valid encounter within last 12 months    Recent Outpatient Visits           1 month ago Tick bite of hip, right, initial encounter   [place] [facility] [place], Salvadore Oxford, NP   5 months ago COVID-19   [facility] [place], Salvadore Oxford, NP   7 months ago Abdominal cramping   [place] [facility] Smitty Cords, DO   8 months ago Suspected sleep apnea   [place] [facility] Smitty Cords, DO   9 months ago Morbid obesity [facility] [place])   [facility] [place], Netta Neat, [place]

## 2023-03-22 ENCOUNTER — Ambulatory Visit: Payer: Self-pay

## 2023-03-22 NOTE — Telephone Encounter (Signed)
  Chief Complaint: SOB  Symptoms: 12 lbs  Frequency: last few month  Pertinent Negatives: Patient denies wheezing, SOB at rest  Disposition: [] ED /[] Urgent Care (no appt availability in office) / [x] Appointment(In office/virtual)/ []  [place] [facility]/ [] Home Care/ [] Refused Recommended Disposition /[] [place] [facility]/ []  Follow-up with PCP Additional Notes: agent made appt prior to transfer.  Reason for Disposition  [1] MILD longstanding difficulty breathing AND [2]  SAME as normal  Answer Assessment - Initial Assessment Questions 1. RESPIRATORY STATUS: "Describe your breathing?" (e.g., wheezing, shortness of breath, unable to speak, severe coughing)      SOB 2. ONSET: "When did this breathing problem begin?"      With  3. PATTERN "Does the difficult breathing come and go, or has it been constant since it started?"     Worse with activity 4. SEVERITY: "How bad is your breathing?" (e.g., mild, moderate, severe)    - MILD: No SOB at rest, mild SOB with walking, speaks normally in sentences, can lie down, no retractions, pulse < 100.    - MODERATE: SOB at rest, SOB with minimal exertion and prefers to sit, cannot lie down flat, speaks in phrases, mild retractions, audible wheezing, pulse 100-120.    - SEVERE: Very SOB at rest, speaks in single words, struggling to breathe, sitting hunched forward, retractions, pulse > 120      SOB  5. RECURRENT SYMPTOM: "Have you had difficulty breathing before?" If Yes, ask: "When was the last time?" and "What happened that time?"      H/o asthma since birth 8. CAUSE: "What do you think is causing the breathing problem?"      Weight gain, thinks has long Covid,   9. OTHER SYMPTOMS: "Do you have any other symptoms? (e.g., dizziness, runny nose, cough, chest pain, fever)     Gained 12 lbs  Protocols used: Breathing Difficulty-A-AH

## 2023-03-23 ENCOUNTER — Ambulatory Visit: Payer: Self-pay | Admitting: Family Medicine

## 2023-03-23 ENCOUNTER — Encounter: Payer: Self-pay | Admitting: Family Medicine

## 2023-03-23 DIAGNOSIS — M9902 Segmental and somatic dysfunction of thoracic region: Secondary | ICD-10-CM | POA: Diagnosis not present

## 2023-03-23 DIAGNOSIS — M5451 Vertebrogenic low back pain: Secondary | ICD-10-CM | POA: Diagnosis not present

## 2023-03-23 DIAGNOSIS — J4530 Mild persistent asthma, uncomplicated: Secondary | ICD-10-CM

## 2023-03-23 DIAGNOSIS — M9903 Segmental and somatic dysfunction of lumbar region: Secondary | ICD-10-CM | POA: Diagnosis not present

## 2023-03-23 DIAGNOSIS — M5414 Radiculopathy, thoracic region: Secondary | ICD-10-CM | POA: Diagnosis not present

## 2023-03-23 DIAGNOSIS — I10 Essential (primary) hypertension: Secondary | ICD-10-CM

## 2023-03-23 NOTE — Progress Notes (Signed)
Subjective:    Patient ID: Derek Cook, male    DOB: October 06, 1960, 62 y.o.   MRN: [id number]  Derek Cook is a 62 y.o. male presenting on 03/23/2023 for Obesity and Wheezing   HPI  Morbid Obesity BMI 39.99 / Obesity Hypoventilation Syndrome Post COVID Fatigue / Dyspnea Suspected Sleep Apnea  Waking up with wheezing and dyspnea with exertion  Last visit similar issue 07/10/22, suspected sleep apnea He has done PSG initially but unable to pursue cost of CPAP machine / confirmation  Admits life stressors with relationship, not sleeping in same bed as wife, has new job. Some things causing stress  Post COVID history  Followed by Dr Anner Crete, chiropractor, one disc in neck is thinner and compressed, not pinched.      06/16/2022    9:55 AM 04/28/2022   10:43 AM 01/14/2021    1:19 PM  Depression screen PHQ 2/9  Decreased Interest 0 0 0  Down, Depressed, Hopeless 0 0 0  PHQ - 2 Score 0 0 0  Altered sleeping 0 0 0  Tired, decreased energy 0 0 0  Change in appetite 0 0 0  Feeling bad or failure about yourself  0 0 0  Trouble concentrating 0 0 0  Moving slowly or fidgety/restless 0 0 0  Suicidal thoughts 0 0 0  PHQ-9 Score 0 0 0  Difficult doing work/chores Not difficult at all Not difficult at all Not difficult at all      06/16/2022    9:55 AM 04/28/2022   10:43 AM 01/14/2021    1:19 PM  GAD 7 : Generalized Anxiety Score  Nervous, Anxious, on Edge 0 0 0  Control/stop worrying 0 0 0  Worry too much - different things 0 0 0  Trouble relaxing 0 0 0  Restless 0 0 0  Easily annoyed or irritable 0 0 0  Afraid - awful might happen 0 0 0  Total GAD 7 Score 0 0 0  Anxiety Difficulty Not difficult at all Not difficult at all Not difficult at all      Social History   Tobacco Use   Smoking status: Former    Current packs/day: 0.00    Types: Cigarettes    Quit date: 09/07/2009    Years since quitting: 13.5   Smokeless tobacco: Former  Building services engineer status: Never  Used  Substance Use Topics   Alcohol use: No   Drug use: No    Review of Systems Per HPI unless specifically indicated above     Objective:    BP 130/74   Pulse 96   Ht 5\' 8"  (1.727 m)   Wt 263 lb (119.3 kg)   SpO2 98%   BMI 39.99 kg/m   Wt Readings from Last 3 Encounters:  03/23/23 263 lb (119.3 kg)  02/28/23 265 lb (120.2 kg)  01/19/23 261 lb (118.4 kg)    Physical Exam Vitals and nursing note reviewed.  Constitutional:      General: He is not in acute distress.    Appearance: He is well-developed. He is obese. He is not diaphoretic.     Comments: Well-appearing, comfortable, cooperative  HENT:     Head: Normocephalic and atraumatic.  Eyes:     General:        Right eye: No discharge.        Left eye: No discharge.     Conjunctiva/sclera: Conjunctivae normal.  Neck:     Thyroid: No  thyromegaly.  Cardiovascular:     Rate and Rhythm: Normal rate and regular rhythm.     Pulses: Normal pulses.     Heart sounds: Normal heart sounds. No murmur heard. Pulmonary:     Effort: Pulmonary effort is normal. No respiratory distress.     Breath sounds: Normal breath sounds. No wheezing or rales.  Musculoskeletal:        General: Normal range of motion.     Cervical back: Normal range of motion and neck supple.  Lymphadenopathy:     Cervical: No cervical adenopathy.  Skin:    General: Skin is warm and dry.     Findings: No erythema or rash.  Neurological:     Mental Status: He is alert and oriented to person, place, and time. Mental status is at baseline.  Psychiatric:        Behavior: Behavior normal.     Comments: Well groomed, good eye contact, normal speech and thoughts    Results for orders placed or performed during the hospital encounter of 02/28/23  SARS Coronavirus 2 by RT PCR (hospital order, performed in [facility] hospital lab) *cepheid single result test* Anterior Nasal Swab   Specimen: Anterior Nasal Swab  Result Value Ref Range   SARS Coronavirus 2  by RT PCR NEGATIVE NEGATIVE  Urine Culture   Specimen: Urine, Clean Catch  Result Value Ref Range   Specimen Description      URINE, CLEAN CATCH Performed at [facility], [address], [place], [place] [address]    Special Requests      NONE Performed at [facility], [address] [place], [place] [address]    Culture      NO GROWTH Performed at [facility], [address] [place], [place] [address]    Report Status 03/01/2023 FINAL   Urinalysis, w/ Reflex to Culture (Infection Suspected) -Urine, Clean Catch  Result Value Ref Range   Specimen Source URINE, CLEAN CATCH    Color, Urine YELLOW YELLOW   APPearance CLEAR CLEAR   Specific Gravity, Urine 1.025 1.005 - 1.030   pH 7.0 5.0 - 8.0   Glucose, UA NEGATIVE NEGATIVE mg/dL   Hgb urine dipstick TRACE (A) NEGATIVE   Bilirubin Urine NEGATIVE NEGATIVE   Ketones, ur NEGATIVE NEGATIVE mg/dL   Protein, ur NEGATIVE NEGATIVE mg/dL   Nitrite NEGATIVE NEGATIVE   Leukocytes,Ua TRACE (A) NEGATIVE   Squamous Epithelial / HPF 0-5 0 - 5 /HPF   WBC, UA 6-10 0 - 5 WBC/hpf   RBC / HPF 6-10 0 - 5 RBC/hpf   Bacteria, UA FEW (A) NONE SEEN   Mucus PRESENT       Assessment & Plan:   Problem List Items Addressed This Visit     Hypertension   Mild persistent asthma   Morbid obesity (HCC) - Primary    Asthma, persistent mild Sample Air Supra Continue Albuterol AS NEEDED Continue Advair inhaler maintenance Future follow up if worsening, suspect weight gain has impacted his breathing.  Morbid Obesity BMI 39.99 with co morbid risk factors Complications Hypertension, Hyperlipidemia AVS info on options including Contarve and GLP/GIP therapy, check insurance cost coverage notify when ready to pursue order  No orders of the defined types were placed in this encounter.     Follow up plan: Return in about 3 months (around 06/23/2023) for 3 month follow-up Weight Management.  [name],  DO [facility] 03/23/2023,  3:58 PM

## 2023-03-23 NOTE — Patient Instructions (Addendum)
Thank you for coming to the office today.  For Weight Loss / Obesity only  Contrave - oral medication, appetite suppression has wellbutrin/bupropion and naltrexone in it and it can also help with appetite, it is ordered through a speciality pharmacy. - $99 per month  Free sample 7 day, 1 pill per day for 1 week  For Injection Rx for Weight Management  Zepbound (weekly inj) Wegovy (weekly inj) Saxenda (daily inj)  Alternative options  Semaglutide injection (mixed Ozempic) from [employer] 0.25mg  weekly for 4 weeks then increase to 0.5mg  weekly It comes in a vial and a needle syringe, you need to draw up the shot and self admin it weekly Cost is about $200 per month Call them to check pricing and availability  [facility] Address: [address] [place], [place] [address] Phone: [phone]  --------------------------------  [facility] office in [place], does not accept insurance. Call to schedule / Walk in to schedule They can do the weekly weight loss injections (same as Ozempic) Pay per visit and per shot. It may be approximately $60-75+ per visit/shot, approximately $200-300 range per month depending  [facility] Address: [address] [place], [place] [address] Phone: [phone]   ---------------  Switch Albuterol with Paulene Floor as needed rescue inhaler.  Please schedule a Follow-up Appointment to: Return in about 3 months (around 06/23/2023) for 3 month follow-up Weight Management.  If you have any other questions or concerns, please feel free to call the office or send a message through MyChart. You may also schedule an earlier appointment if necessary.  Additionally, you may be receiving a survey about your experience at our office within a few days to 1 week by e-mail or mail. We value your feedback.  [name], DO [facility], [place]

## 2023-03-24 ENCOUNTER — Encounter: Payer: Self-pay | Admitting: Family Medicine

## 2023-03-24 ENCOUNTER — Telehealth: Payer: Self-pay | Admitting: Family Medicine

## 2023-03-24 NOTE — Telephone Encounter (Signed)
Patient called and stated he spoke with [employer] about medication, Zepbound, and that [employer] told patient that Dr. Althea Charon needs to do a PRIOR AUTHORIZATION for Zepbound.  Per [employer], told patient this is a tier 4 prescription for him and it would cost him $60 once the prior Berkley Harvey is completed.  Also, patient stated [employer] said Dr. Althea Charon needs to mark this urgent.    Patients callback #: [phone]

## 2023-03-24 NOTE — Telephone Encounter (Signed)
Okay, we can proceed to order and do the PA for Zepbound for Morbid Obesity dx.  He will need Zepbound 2.5mg  weekly inj (2mL for 28 daysupply) we usually dose increase every month as tolerated. He can contact us back depending on how he does on initial starting dose for new orders of higher dose.  Can you clarify which pharmacy? As far as I know, [facility] was not ordering these injectable wt loss meds, we have had to switch to other pharmacy in the past.  [name], DO [facility] 03/24/2023, 6:01 PM

## 2023-03-25 DIAGNOSIS — M9902 Segmental and somatic dysfunction of thoracic region: Secondary | ICD-10-CM | POA: Diagnosis not present

## 2023-03-25 DIAGNOSIS — M5414 Radiculopathy, thoracic region: Secondary | ICD-10-CM | POA: Diagnosis not present

## 2023-03-25 DIAGNOSIS — M5451 Vertebrogenic low back pain: Secondary | ICD-10-CM | POA: Diagnosis not present

## 2023-03-25 DIAGNOSIS — M9903 Segmental and somatic dysfunction of lumbar region: Secondary | ICD-10-CM | POA: Diagnosis not present

## 2023-03-25 MED ORDER — ZEPBOUND 2.5 MG/0.5ML ~~LOC~~ SOAJ
2.5000 mg | SUBCUTANEOUS | 0 refills | Status: DC
Start: 2023-03-25 — End: 2023-04-30

## 2023-03-25 NOTE — Telephone Encounter (Signed)
(  Key: [id number])   Valinda Hoar [place] is processing your PA request and will respond shortly with next steps. You may close this dialog, return to your dashboard, and perform other tasks. To check for an update later, open this request again from your dashboard.  If you need assistance, please chat with CoverMyMeds or call us at [phone].

## 2023-03-25 NOTE — Telephone Encounter (Signed)
Spoke with patient and he said to go ahead and send it to [facility] if not [facility].

## 2023-03-25 NOTE — Addendum Note (Signed)
Addended by: Smitty Cords on: 03/25/2023 02:32 PM   Modules accepted: Orders

## 2023-03-25 NOTE — Telephone Encounter (Signed)
Sent rx to [facility]  [name], DO [facility] 03/25/2023, 2:32 PM

## 2023-03-29 NOTE — Telephone Encounter (Signed)
(  Key: [id number])  form thumbnail [employer] [place] has not yet replied to your PA request. You may close this dialog, return to your dashboard, and perform other tasks.  To check for an update later, open this request again from your dashboard.  If [employer] has not replied to your request within 24 hours for an urgent request or 72 hours for a standard request, please contact [employer] [place] at [phone] option 3, then option 3 again.

## 2023-03-29 NOTE — Telephone Encounter (Signed)
(  Key: [id number]) PA Case ID #: [id number] Rx #: [id number] Need Help? Call us at [phone] Status sent iconSent to Plan today Drug Zepbound 2.5MG /0.5ML pen-injectors ePA cloud logo Form [employer] [place] Commercial Electronic Request Form Original Claim Info [phone] Patient Assistance Patient assistance and financial support may be available through the manufacturer's patient savings program. For more information, and to see program requirements, follow the link below:  Patient Savings Program: click here.  Diagnosis ZEPBOUNDT (tirzepatide) is indicated as an adjunct to a reduced-calorie diet and increased physical activity for chronic weight management in adults with an initial body mass index (BMI) of: 30 kg/m2 or greater (obesity) or 27 kg/m2 or greater (overweight) in the presence of at least one weight-related comorbid condition (e.g., hypertension, dyslipidemia, type 2 diabetes mellitus, obstructive sleep apnea, or cardiovascular disease). Limitations of Use: ZEPBOUND (tirzepatide) contains tirzepatide. Coadministration with other tirzepatide-containing products or with any glucagon-like peptide-1 (GLP-1) receptor agonist is not recommended. The safety and efficacy of ZEPBOUND (tirzepatide) in combination with other products intended for weight management, including prescription drugs, over-the-counter drugs, and herbal preparations, have not been established. ZEPBOUND (tirzepatide) has not been studied in patients with a history of pancreatitis.   WARNING: RISK OF THYROID C-CELL TUMORS In rats, tirzepatide causes thyroid C-cell tumors. It is unknown whether ZEPBOUND causes thyroid C-cell tumors, including medullary thyroid carcinoma (MTC), in humans as the human relevance of tirzepatide-induced rodent thyroid C-cell tumors has not been determined. ZEPBOUND is contraindicated in patients with a personal or family history of MTC or in patients with Multiple Endocrine Neoplasia syndrome  type 2 (MEN 2). Counsel patients regarding the potential risk of MTC and symptoms of thyroid tumors.   Commonly used ICD-10 diagnosis code(s): E66* (Overweight and obesity) Z68* (Body mass index [BMI])  * Indicates multiple available codes within a diagnosis category  These codes are presented for informational purposes only. They represent no statement, promise, or guarantee concerning coverage and/or levels of reimbursement, payment, or charge, and are not intended to increase or maximize reimbursement by any payer. It is the responsibility of the healthcare provider to determine appropriate code(s) for services provided to their patient.  Prescribing Information: click here. Important Safety Information: click here.  Please indicate the patient's previous medication history in a free text field (if available) for any of the following medications: Benzphetamine (Didrex) Bupropion-naltrexone (Contrave) Diethylpropion (Tenuate) Liraglutide (Saxenda) Orlistat (Alli; Xenical) Phendimetrazine Phentermine (Adipex-P; Lomaira) Phentermine-topiramate (Qsymia) Semaglutide [facility]) Other (Please specify)

## 2023-03-30 DIAGNOSIS — M9903 Segmental and somatic dysfunction of lumbar region: Secondary | ICD-10-CM | POA: Diagnosis not present

## 2023-03-30 DIAGNOSIS — M9902 Segmental and somatic dysfunction of thoracic region: Secondary | ICD-10-CM | POA: Diagnosis not present

## 2023-03-30 DIAGNOSIS — M5451 Vertebrogenic low back pain: Secondary | ICD-10-CM | POA: Diagnosis not present

## 2023-03-30 DIAGNOSIS — M5414 Radiculopathy, thoracic region: Secondary | ICD-10-CM | POA: Diagnosis not present

## 2023-03-30 NOTE — Telephone Encounter (Signed)
(  Key: [id number]) PA Case ID #: [id number] Rx #: [id number] Need Help? Call us at [phone] Outcome Denied on July 22 Denied. This health benefit plan does not cover the following services, supplies, drugs or charges: Any treatment or regimen, medical or surgical, for the purpose of reducing or controlling the weight of the member, or for the treatment of obesity, except for surgical treatment of morbid obesity, or as specifically covered by this health benefit plan.

## 2023-04-01 DIAGNOSIS — M9903 Segmental and somatic dysfunction of lumbar region: Secondary | ICD-10-CM | POA: Diagnosis not present

## 2023-04-01 DIAGNOSIS — M9902 Segmental and somatic dysfunction of thoracic region: Secondary | ICD-10-CM | POA: Diagnosis not present

## 2023-04-01 DIAGNOSIS — M5451 Vertebrogenic low back pain: Secondary | ICD-10-CM | POA: Diagnosis not present

## 2023-04-01 DIAGNOSIS — M5414 Radiculopathy, thoracic region: Secondary | ICD-10-CM | POA: Diagnosis not present

## 2023-04-08 DIAGNOSIS — M5414 Radiculopathy, thoracic region: Secondary | ICD-10-CM | POA: Diagnosis not present

## 2023-04-08 DIAGNOSIS — M5451 Vertebrogenic low back pain: Secondary | ICD-10-CM | POA: Diagnosis not present

## 2023-04-08 DIAGNOSIS — M9902 Segmental and somatic dysfunction of thoracic region: Secondary | ICD-10-CM | POA: Diagnosis not present

## 2023-04-08 DIAGNOSIS — M9903 Segmental and somatic dysfunction of lumbar region: Secondary | ICD-10-CM | POA: Diagnosis not present

## 2023-04-13 DIAGNOSIS — M5414 Radiculopathy, thoracic region: Secondary | ICD-10-CM | POA: Diagnosis not present

## 2023-04-13 DIAGNOSIS — M5451 Vertebrogenic low back pain: Secondary | ICD-10-CM | POA: Diagnosis not present

## 2023-04-13 DIAGNOSIS — M9902 Segmental and somatic dysfunction of thoracic region: Secondary | ICD-10-CM | POA: Diagnosis not present

## 2023-04-13 DIAGNOSIS — M9903 Segmental and somatic dysfunction of lumbar region: Secondary | ICD-10-CM | POA: Diagnosis not present

## 2023-04-20 DIAGNOSIS — M9903 Segmental and somatic dysfunction of lumbar region: Secondary | ICD-10-CM | POA: Diagnosis not present

## 2023-04-20 DIAGNOSIS — M5451 Vertebrogenic low back pain: Secondary | ICD-10-CM | POA: Diagnosis not present

## 2023-04-20 DIAGNOSIS — M5414 Radiculopathy, thoracic region: Secondary | ICD-10-CM | POA: Diagnosis not present

## 2023-04-20 DIAGNOSIS — M9902 Segmental and somatic dysfunction of thoracic region: Secondary | ICD-10-CM | POA: Diagnosis not present

## 2023-04-22 ENCOUNTER — Ambulatory Visit: Payer: Self-pay | Admitting: *Deleted

## 2023-04-22 NOTE — Telephone Encounter (Signed)
FYI

## 2023-04-22 NOTE — Telephone Encounter (Signed)
  Chief Complaint: SOB with exertion worsening  Symptoms: weight gain, SOB with exertion. Wheezing at times. Coughing up white phlegm. Reports a chemical smell in work truck that makes him feel more SOB. LE leg swelling. Wears compression stockings Frequency: na  Pertinent Negatives: Patient denies chest pain no difficulty breathing , no fever. No elevated HR Disposition: [] ED /[x] Urgent Care (no appt availability in office) / [] Appointment(In office/virtual)/ []  [place] [facility]/ [] Home Care/ [] Refused Recommended Disposition /[] [place] [facility]/ []  Follow-up with PCP Additional Notes:   No available appt with provider and will not see E. Mecum, PA. 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 Please advise    Reason for Disposition  [1] MILD difficulty breathing (e.g., minimal/no SOB at rest, SOB with walking, pulse <100) AND [2] NEW-onset or WORSE than normal  Answer Assessment - Initial Assessment Questions 1. RESPIRATORY STATUS: "Describe your breathing?" (e.g., wheezing, shortness of breath, unable to speak, severe coughing)      Shortness of breath with exertion 2. ONSET: "When did this breathing problem begin?"      On going but getting worse  3. PATTERN "Does the difficult breathing come and go, or has it been constant since it started?"      Noted more often  4. SEVERITY: "How bad is your breathing?" (e.g., mild, moderate, severe)    - MILD: No SOB at rest, mild SOB with walking, speaks normally in sentences, can lie down, no retractions, pulse < 100.    - MODERATE: SOB at rest, SOB with minimal exertion and prefers to sit, cannot lie down flat, speaks in phrases, mild retractions, audible wheezing, pulse 100-120.    - SEVERE: Very SOB at rest, speaks in single words, struggling to breathe, sitting hunched forward, retractions, pulse > 120      SOB with exertion, wheezing at times. Cough up white phelgm 5. RECURRENT SYMPTOM: "Have you had  difficulty breathing before?" If Yes, ask: "When was the last time?" and "What happened that time?"      Yes 6. CARDIAC HISTORY: "Do you have any history of heart disease?" (e.g., heart attack, angina, bypass surgery, angioplasty)      na 7. LUNG HISTORY: "Do you have any history of lung disease?"  (e.g., pulmonary embolus, asthma, emphysema)     na 8. CAUSE: "What do you think is causing the breathing problem?"      Not sure possible weight gain 9. OTHER SYMPTOMS: "Do you have any other symptoms? (e.g., dizziness, runny nose, cough, chest pain, fever)     Wears compression socks and gained weight swelling LEs.  10. O2 SATURATION MONITOR:  "Do you use an oxygen saturation monitor (pulse oximeter) at home?" If Yes, ask: "What is your reading (oxygen level) today?" "What is your usual oxygen saturation reading?" (e.g., 95%)       na 11. PREGNANCY: "Is there any chance you are pregnant?" "When was your last menstrual period?"       na 12. TRAVEL: "Have you traveled out of the country in the last month?" (e.g., travel history, exposures)       na  Protocols used: Breathing Difficulty-A-AH

## 2023-04-22 NOTE — Telephone Encounter (Signed)
Pt declined to see a different provider.  He wanted to wait until Dr. Althea Charon next appointment.  (04/30/2023 at 10am)    Thanks,   -Vernona Rieger

## 2023-04-22 NOTE — Telephone Encounter (Signed)
Possible asthma flare triggered by something like chemical or other environmental trigger.  I agree some sort of evaluation such as an Urgent Care would be useful, or other acute care visit, but he said he is declining to see our coverage.  [name], DO [facility] 04/22/2023, 9:29 AM

## 2023-04-30 ENCOUNTER — Ambulatory Visit: Payer: BC Managed Care – PPO | Admitting: Family Medicine

## 2023-04-30 ENCOUNTER — Encounter: Payer: Self-pay | Admitting: Family Medicine

## 2023-04-30 VITALS — BP 150/88 | HR 83 | Ht 68.0 in | Wt 265.0 lb

## 2023-04-30 DIAGNOSIS — J4530 Mild persistent asthma, uncomplicated: Secondary | ICD-10-CM | POA: Diagnosis not present

## 2023-04-30 DIAGNOSIS — I10 Essential (primary) hypertension: Secondary | ICD-10-CM

## 2023-04-30 MED ORDER — HYDROCHLOROTHIAZIDE 25 MG PO TABS
25.0000 mg | ORAL_TABLET | Freq: Every day | ORAL | 1 refills | Status: DC
Start: 2023-04-30 — End: 2023-07-19

## 2023-04-30 MED ORDER — POTASSIUM CHLORIDE CRYS ER 20 MEQ PO TBCR: 20.0000 meq | EXTENDED_RELEASE_TABLET | Freq: Every day | ORAL | 1 refills | Status: DC

## 2023-04-30 MED ORDER — AIRSUPRA 90-80 MCG/ACT IN AERO
1.0000 | INHALATION_SPRAY | RESPIRATORY_TRACT | 5 refills | Status: DC | PRN
Start: 2023-04-30 — End: 2023-08-25

## 2023-04-30 NOTE — Patient Instructions (Signed)
Thank you for coming to the office today.    Please schedule a Follow-up Appointment to: No follow-ups on file.  If you have any other questions or concerns, please feel free to call the office or send a message through MyChart. You may also schedule an earlier appointment if necessary.  Additionally, you may be receiving a survey about your experience at our office within a few days to 1 week by e-mail or mail. We value your feedback.  Alexander Karamalegos, DO [facility], [facility] 

## 2023-04-30 NOTE — Progress Notes (Signed)
Subjective:    Patient ID: Derek Cook, male    DOB: 1961-03-06, 62 y.o.   MRN: [id number]  Derek Cook is a 62 y.o. male presenting on 04/30/2023 for Asthma and Edema (Swelling in legs. )   HPI  Mild Asthma, Persistent Chronic problem with environmental exposure triggering his symptoms, dust particles etc Admits Tight breathing but no active wheezing. No waking up with wheezing. He feels lungs are tighter and difficulty to open lungs and breathing more He has Advair using regularly Tried Airsupra before and it was helpful.  Morbid Obesity BMI 40 Obesity Hypoventilation Syndrome Hx Post COVID Fatigue / Dyspnea Last visit similar issue 07/10/22, suspected sleep apnea He has done PSG initially but unable to pursue cost of CPAP machine / confirmation  CHRONIC HTN: Reports elevated BP w/ inc gain wt and fluid Current Meds - None, but tried hydrochlorothiazide had old rx, felt muscle cramping. It was successful however Admits leg swelling Denies CP, dyspnea, HA, dizziness / lightheadedness        04/30/2023    1:18 PM 06/16/2022    9:55 AM 04/28/2022   10:43 AM  Depression screen PHQ 2/9  Decreased Interest 0 0 0  Down, Depressed, Hopeless 0 0 0  PHQ - 2 Score 0 0 0  Altered sleeping  0 0  Tired, decreased energy  0 0  Change in appetite  0 0  Feeling bad or failure about yourself   0 0  Trouble concentrating  0 0  Moving slowly or fidgety/restless  0 0  Suicidal thoughts  0 0  PHQ-9 Score  0 0  Difficult doing work/chores  Not difficult at all Not difficult at all    Social History   Tobacco Use   Smoking status: Former    Current packs/day: 0.00    Types: Cigarettes    Quit date: 09/07/2009    Years since quitting: 13.6   Smokeless tobacco: Former  Building services engineer status: Never Used  Substance Use Topics   Alcohol use: No   Drug use: No    Review of Systems Per HPI unless specifically indicated above     Objective:    BP (!) 150/88 (BP  Location: Left Arm, Cuff Size: Normal)   Pulse 83   Ht 5\' 8"  (1.727 m)   Wt 265 lb (120.2 kg)   SpO2 97%   BMI 40.29 kg/m   Wt Readings from Last 3 Encounters:  04/30/23 265 lb (120.2 kg)  03/23/23 263 lb (119.3 kg)  02/28/23 265 lb (120.2 kg)    Physical Exam Vitals and nursing note reviewed.  Constitutional:      General: He is not in acute distress.    Appearance: He is well-developed. He is obese. He is not diaphoretic.     Comments: Well-appearing, comfortable, cooperative  HENT:     Head: Normocephalic and atraumatic.  Eyes:     General:        Right eye: No discharge.        Left eye: No discharge.     Conjunctiva/sclera: Conjunctivae normal.  Neck:     Thyroid: No thyromegaly.  Cardiovascular:     Rate and Rhythm: Normal rate and regular rhythm.     Pulses: Normal pulses.     Heart sounds: Normal heart sounds. No murmur heard. Pulmonary:     Effort: Pulmonary effort is normal. No respiratory distress.     Breath sounds: No wheezing or rales.  Comments: Tight air movement Musculoskeletal:        General: Normal range of motion.     Cervical back: Normal range of motion and neck supple.     Right lower leg: Edema present.     Left lower leg: Edema present.  Lymphadenopathy:     Cervical: No cervical adenopathy.  Skin:    General: Skin is warm and dry.     Findings: No erythema or rash.  Neurological:     Mental Status: He is alert and oriented to person, place, and time. Mental status is at baseline.  Psychiatric:        Behavior: Behavior normal.     Comments: Well groomed, good eye contact, normal speech and thoughts       Results for orders placed or performed during the hospital encounter of 02/28/23  SARS Coronavirus 2 by RT PCR (hospital order, performed in [facility] hospital lab) *cepheid single result test* Anterior Nasal Swab   Specimen: Anterior Nasal Swab  Result Value Ref Range   SARS Coronavirus 2 by RT PCR NEGATIVE NEGATIVE  Urine  Culture   Specimen: Urine, Clean Catch  Result Value Ref Range   Specimen Description      URINE, CLEAN CATCH Performed at [facility], [address] [place], [place] [address]    Special Requests      NONE Performed at [facility], [address] [place], [place] [address]    Culture      NO GROWTH Performed at [facility], [address], [place], [place] [address]    Report Status 03/01/2023 FINAL   Urinalysis, w/ Reflex to Culture (Infection Suspected) -Urine, Clean Catch  Result Value Ref Range   Specimen Source URINE, CLEAN CATCH    Color, Urine YELLOW YELLOW   APPearance CLEAR CLEAR   Specific Gravity, Urine 1.025 1.005 - 1.030   pH 7.0 5.0 - 8.0   Glucose, UA NEGATIVE NEGATIVE mg/dL   Hgb urine dipstick TRACE (A) NEGATIVE   Bilirubin Urine NEGATIVE NEGATIVE   Ketones, ur NEGATIVE NEGATIVE mg/dL   Protein, ur NEGATIVE NEGATIVE mg/dL   Nitrite NEGATIVE NEGATIVE   Leukocytes,Ua TRACE (A) NEGATIVE   Squamous Epithelial / HPF 0-5 0 - 5 /HPF   WBC, UA 6-10 0 - 5 WBC/hpf   RBC / HPF 6-10 0 - 5 RBC/hpf   Bacteria, UA FEW (A) NONE SEEN   Mucus PRESENT       Assessment & Plan:   Problem List Items Addressed This Visit     Hypertension   Relevant Medications   hydrochlorothiazide (HYDRODIURIL) 25 MG tablet   potassium chloride SA (KLOR-CON M) 20 MEQ tablet   Mild persistent asthma - Primary   Relevant Medications   AIRSUPRA 90-80 MCG/ACT AERO   Morbid obesity (HCC)    Morbid Obesity BMI >40 Unable to get Zepbound covered He has tried >6 months weight loss with lifestyle diet activity unsuccessful Will pursue sample Wegovy, 4 week trial, he will notify us if successful We can then pursue fax order to [facility] - order Semaglutide 0.5mg  dose, out of pocket cost.  Well-controlled HTN Elevated BP Worse with edema fluid   Plan:  1. START hydrochlorothiazide 25mg  daily + Potassium daily 2. Encourage improved  lifestyle - low sodium diet, regular exercise 3. Start monitor BP outside office, bring readings to next visit, if persistently >140/90 or new symptoms notify office sooner  Asthma, persistent Discuss  concern w/ occupational exposure Continue Advair Rx Airsupra, + Copay Card  Meds ordered this encounter  Medications   hydrochlorothiazide (HYDRODIURIL) 25 MG tablet    Sig: Take 1 tablet (25 mg total) by mouth daily.    Dispense:  90 tablet    Refill:  1   potassium chloride SA (KLOR-CON M) 20 MEQ tablet    Sig: Take 1 tablet (20 mEq total) by mouth daily.    Dispense:  90 tablet    Refill:  1   AIRSUPRA 90-80 MCG/ACT AERO    Sig: Inhale 1 puff into the lungs every 4 (four) hours as needed (shortness of breath, wheezing).    Dispense:  10.7 g    Refill:  5      Follow up plan: Return if symptoms worsen or fail to improve.   [name], DO [facility] 04/30/2023, 10:11 AM

## 2023-05-24 DIAGNOSIS — M9902 Segmental and somatic dysfunction of thoracic region: Secondary | ICD-10-CM | POA: Diagnosis not present

## 2023-05-24 DIAGNOSIS — M9903 Segmental and somatic dysfunction of lumbar region: Secondary | ICD-10-CM | POA: Diagnosis not present

## 2023-05-24 DIAGNOSIS — M5414 Radiculopathy, thoracic region: Secondary | ICD-10-CM | POA: Diagnosis not present

## 2023-05-24 DIAGNOSIS — M5451 Vertebrogenic low back pain: Secondary | ICD-10-CM | POA: Diagnosis not present

## 2023-06-01 ENCOUNTER — Other Ambulatory Visit: Payer: Self-pay | Admitting: Family Medicine

## 2023-06-01 MED ORDER — SEMAGLUTIDE(0.25 OR 0.5MG/DOS) 2 MG/1.5ML ~~LOC~~ SOPN: 0.5000 mg | PEN_INJECTOR | SUBCUTANEOUS | 0 refills | Status: DC

## 2023-06-18 ENCOUNTER — Other Ambulatory Visit: Payer: Self-pay | Admitting: Family Medicine

## 2023-06-18 DIAGNOSIS — J4530 Mild persistent asthma, uncomplicated: Secondary | ICD-10-CM

## 2023-06-18 NOTE — Telephone Encounter (Signed)
Requested Prescriptions  Pending Prescriptions Disp Refills   fluticasone-salmeterol (ADVAIR) 500-50 MCG/ACT AEPB [Pharmacy Med Name: FLUTICASONE-SALMETEROL 500-50 MCG/A] 180 each 0    Sig: INHALE 1 PUFF BY MOUTH TWICE DAILY. RINSE MOUTH AFTER USE.     Pulmonology:  Combination Products Passed - 06/18/2023  8:20 AM      Passed - Valid encounter within last 12 months    Recent Outpatient Visits           1 month ago Mild persistent asthma without complication   [place] [facility] Smitty Cords, DO   2 months ago Morbid obesity [facility])   [place] [facility] Smitty Cords, DO   5 months ago Tick bite of hip, right, initial encounter   [place] [facility] [place], Salvadore Oxford, NP   9 months ago COVID-19   [facility] [place], Salvadore Oxford, NP   10 months ago Abdominal cramping   [place] [facility] [place], Netta Neat, [place]

## 2023-06-23 ENCOUNTER — Ambulatory Visit: Payer: BC Managed Care – PPO | Admitting: Family Medicine

## 2023-07-16 ENCOUNTER — Other Ambulatory Visit: Payer: Self-pay | Admitting: Family Medicine

## 2023-07-16 DIAGNOSIS — I10 Essential (primary) hypertension: Secondary | ICD-10-CM

## 2023-07-19 NOTE — Telephone Encounter (Signed)
Requested medication (s) are due for refill today: yes   Requested medication (s) are on the active medication list: yes   Last refill:  04/30/23 #90 1 refills  Future visit scheduled: no   Notes to clinic:  protocol failed last labs 07/03/22. Do you want to continue refill?     Requested Prescriptions  Pending Prescriptions Disp Refills   potassium chloride SA (KLOR-CON M) 20 MEQ tablet [Pharmacy Med Name: POTASSIUM CHLORIDE CRYS ER 20 MEQ T] 90 tablet 1    Sig: TAKE 1 TABLET BY MOUTH ONCE DAILY     Endocrinology:  Minerals - Potassium Supplementation Failed - 07/16/2023 11:21 AM      Failed - K in normal range and within 360 days    Potassium  Date Value Ref Range Status  07/03/2022 4.0 3.5 - 5.1 mmol/L Final  07/26/2014 4.1 3.5 - 5.1 mmol/L Final         Failed - Cr in normal range and within 360 days    Creat  Date Value Ref Range Status  04/28/2022 1.17 0.70 - 1.30 mg/dL Final   Creatinine, Ser  Date Value Ref Range Status  07/03/2022 1.10 0.61 - 1.24 mg/dL Final         Passed - Valid encounter within last 12 months    Recent Outpatient Visits           2 months ago Mild persistent asthma without complication   [place] [facility] Smitty Cords, DO   3 months ago Morbid obesity [facility])   [place] [facility] Smitty Cords, DO   6 months ago Tick bite of hip, right, initial encounter   [place] [facility] [place], Salvadore Oxford, NP   10 months ago COVID-19   [facility] [place], Salvadore Oxford, NP   11 months ago Abdominal cramping   [place] [facility] [place], Netta Neat, DO               hydrochlorothiazide (HYDRODIURIL) 25 MG tablet [Pharmacy Med Name: HYDROCHLOROTHIAZIDE 25 MG TAB] 90 tablet 1    Sig: TT1 BY MOUTH ONCE DAILY     Cardiovascular: Diuretics - Thiazide Failed - 07/16/2023 11:21 AM      Failed - Cr in normal range and  within 180 days    Creat  Date Value Ref Range Status  04/28/2022 1.17 0.70 - 1.30 mg/dL Final   Creatinine, Ser  Date Value Ref Range Status  07/03/2022 1.10 0.61 - 1.24 mg/dL Final         Failed - K in normal range and within 180 days    Potassium  Date Value Ref Range Status  07/03/2022 4.0 3.5 - 5.1 mmol/L Final  07/26/2014 4.1 3.5 - 5.1 mmol/L Final         Failed - Na in normal range and within 180 days    Sodium  Date Value Ref Range Status  07/03/2022 137 135 - 145 mmol/L Final  07/26/2014 138 136 - 145 mmol/L Final         Failed - Last BP in normal range    BP Readings from Last 1 Encounters:  04/30/23 (!) 150/88         Passed - Valid encounter within last 6 months    Recent Outpatient Visits           2 months ago Mild persistent asthma without complication  [place] [facility] Smitty Cords, DO   3 months ago Morbid obesity [facility])   [place] [facility] Smitty Cords, DO   6 months ago Tick bite of hip, right, initial encounter   [place] [facility] [place], Salvadore Oxford, NP   10 months ago COVID-19   [facility] [place], [place]   11 months ago Abdominal cramping   [place] [facility] Smitty Cords, [place]

## 2023-07-26 ENCOUNTER — Other Ambulatory Visit: Payer: Self-pay | Admitting: Family Medicine

## 2023-07-27 NOTE — Telephone Encounter (Signed)
Requested medication (s) are due for refill today: yes  Requested medication (s) are on the active medication list: yes  Last refill:  06/01/23 #54mL/0  Future visit scheduled: no  Notes to clinic:  Unable to refill per protocol due to failed labs, no updated results.      Requested Prescriptions  Pending Prescriptions Disp Refills   Semaglutide,0.25 or 0.5MG /DOS, 2 MG/1.5ML SOPN [Pharmacy Med Name: SEMAGLUTIDE1MG /MLINJ(5)] 1.5 mL 0    Sig: INJECT 50 UNITS (0.5MG ) SUBCUTANEOUSLY ONCE A WEEK FOR FOUR (4) WEEKS     Endocrinology:  Diabetes - GLP-1 Receptor Agonists - semaglutide Failed - 07/26/2023  5:17 PM      Failed - HBA1C in normal range and within 180 days    Hgb A1c MFr Bld  Date Value Ref Range Status  04/28/2022 5.8 (H) <5.7 % of total Hgb Final    Comment:    For someone without known diabetes, a hemoglobin  A1c value between 5.7% and 6.4% is consistent with prediabetes and should be confirmed with a  follow-up test. . For someone with known diabetes, a value <7% indicates that their diabetes is well controlled. A1c targets should be individualized based on duration of diabetes, age, comorbid conditions, and other considerations. . This assay result is consistent with an increased risk of diabetes. . Currently, no consensus exists regarding use of hemoglobin A1c for diagnosis of diabetes for children. .          Failed - Cr in normal range and within 360 days    Creat  Date Value Ref Range Status  04/28/2022 1.17 0.70 - 1.30 mg/dL Final   Creatinine, Ser  Date Value Ref Range Status  07/03/2022 1.10 0.61 - 1.24 mg/dL Final         Passed - Valid encounter within last 6 months    Recent Outpatient Visits           2 months ago Mild persistent asthma without complication   [place] [facility] Smitty Cords, DO   4 months ago Morbid obesity [facility])   [place] [facility] Smitty Cords, DO   6  months ago Tick bite of hip, right, initial encounter   [place] [facility] [place], Salvadore Oxford, NP   10 months ago COVID-19   [facility] [place], Salvadore Oxford, NP   12 months ago Abdominal cramping   [place] [facility] [place], Netta Ne[place], [place]

## 2023-07-29 DIAGNOSIS — M9903 Segmental and somatic dysfunction of lumbar region: Secondary | ICD-10-CM | POA: Diagnosis not present

## 2023-07-29 DIAGNOSIS — M5414 Radiculopathy, thoracic region: Secondary | ICD-10-CM | POA: Diagnosis not present

## 2023-07-29 DIAGNOSIS — M9902 Segmental and somatic dysfunction of thoracic region: Secondary | ICD-10-CM | POA: Diagnosis not present

## 2023-07-29 DIAGNOSIS — M5451 Vertebrogenic low back pain: Secondary | ICD-10-CM | POA: Diagnosis not present

## 2023-08-03 DIAGNOSIS — M9902 Segmental and somatic dysfunction of thoracic region: Secondary | ICD-10-CM | POA: Diagnosis not present

## 2023-08-03 DIAGNOSIS — M5414 Radiculopathy, thoracic region: Secondary | ICD-10-CM | POA: Diagnosis not present

## 2023-08-03 DIAGNOSIS — M5451 Vertebrogenic low back pain: Secondary | ICD-10-CM | POA: Diagnosis not present

## 2023-08-03 DIAGNOSIS — M9903 Segmental and somatic dysfunction of lumbar region: Secondary | ICD-10-CM | POA: Diagnosis not present

## 2023-08-23 ENCOUNTER — Ambulatory Visit: Payer: Self-pay | Admitting: *Deleted

## 2023-08-23 NOTE — Telephone Encounter (Signed)
Chief Complaint: continued cough, runny nose SOB with exertion requesting medication / steroid Symptoms: cough, yellow / white mucus, runny nose. SOB with cough and exertion, wheezing early am. Tiredness. Has been using inhalers motrin / 1 tylenol with no relief.  Frequency: continued 2 days prior to Thanksgiving  Pertinent Negatives: Patient denies chest pain no difficulty breathing no fever Disposition: [] ED /[x] Urgent Care (no appt availability in office) / [] Appointment(In office/virtual)/ []  [facility]/ [] Home Care/ [] Refused Recommended Disposition /[] [place] [facility]/ []  Follow-up with PCP Additional Notes:   No available appt with PCP until 09/02/23. Patient only wants to see PCP. Patient drives truck and requesting medication. Recommended UC due to no appt with in 24 hours. Please advise . Patient requesting a call back.    Summary: medication request   Patient stated he has a cough with yellow/white mucus and runny nose. He is requesting something to dry up the congestion like a steroid. Please f/u with patient           Reason for Disposition  [1] Continuous (nonstop) coughing interferes with work or school AND [2] no improvement using cough treatment per Care Advice  Answer Assessment - Initial Assessment Questions 1. ONSET: "When did the cough begin?"      2 days before Thanksgiving and is not going away 2. SEVERITY: "How bad is the cough today?"      Productive yellow / white coughing spells . SOB and wheezing  3. SPUTUM: "Describe the color of your sputum" (none, dry cough; clear, white, yellow, green)     Yellow / white in color 4. HEMOPTYSIS: "Are you coughing up any blood?" If so ask: "How much?" (flecks, streaks, tablespoons, etc.)     na 5. DIFFICULTY BREATHING: "Are you having difficulty breathing?" If Yes, ask: "How bad is it?" (e.g., mild, moderate, severe)    - MILD: No SOB at rest, mild SOB with walking, speaks normally in sentences, can  lie down, no retractions, pulse < 100.    - MODERATE: SOB at rest, SOB with minimal exertion and prefers to sit, cannot lie down flat, speaks in phrases, mild retractions, audible wheezing, pulse 100-120.    - SEVERE: Very SOB at rest, speaks in single words, struggling to breathe, sitting hunched forward, retractions, pulse > 120      SOB with coughing , wheezing at times waking up  6. FEVER: "Do you have a fever?" If Yes, ask: "What is your temperature, how was it measured, and when did it start?"     na 7. CARDIAC HISTORY: "Do you have any history of heart disease?" (e.g., heart attack, congestive heart failure)      na 8. LUNG HISTORY: "Do you have any history of lung disease?"  (e.g., pulmonary embolus, asthma, emphysema)     na 9. PE RISK FACTORS: "Do you have a history of blood clots?" (or: recent major surgery, recent prolonged travel, bedridden)     na 10. OTHER SYMPTOMS: "Do you have any other symptoms?" (e.g., runny nose, wheezing, chest pain)       Wheezing at times early am , SOB with exertion and coughing. Very tired. Runny nose  11. PREGNANCY: "Is there any chance you are pregnant?" "When was your last menstrual period?"       na 12. TRAVEL: "Have you traveled out of the country in the last month?" (e.g., travel history, exposures)       na  Protocols used: Cough - Acute Productive-A-AH

## 2023-08-23 NOTE — Telephone Encounter (Signed)
Appointment made on 08/25/23.  Verbal understanding.

## 2023-08-25 ENCOUNTER — Ambulatory Visit: Payer: BC Managed Care – PPO | Admitting: Family Medicine

## 2023-08-25 ENCOUNTER — Encounter: Payer: Self-pay | Admitting: Family Medicine

## 2023-08-25 VITALS — BP 142/78 | HR 100 | Ht 68.0 in | Wt 267.0 lb

## 2023-08-25 DIAGNOSIS — J4530 Mild persistent asthma, uncomplicated: Secondary | ICD-10-CM

## 2023-08-25 DIAGNOSIS — J0110 Acute frontal sinusitis, unspecified: Secondary | ICD-10-CM | POA: Diagnosis not present

## 2023-08-25 MED ORDER — FLUTICASONE-SALMETEROL 500-50 MCG/ACT IN AEPB: 1.0000 | INHALATION_SPRAY | Freq: Two times a day (BID) | RESPIRATORY_TRACT | 1 refills | Status: DC

## 2023-08-25 MED ORDER — CONTRAVE 8-90 MG PO TB12: ORAL_TABLET | ORAL | Status: DC

## 2023-08-25 MED ORDER — PREDNISONE 20 MG PO TABS: ORAL_TABLET | ORAL | 0 refills | Status: DC

## 2023-08-25 MED ORDER — AMOXICILLIN-POT CLAVULANATE 875-125 MG PO TABS: 1.0000 | ORAL_TABLET | Freq: Two times a day (BID) | ORAL | 0 refills | Status: DC

## 2023-08-25 MED ORDER — ALBUTEROL SULFATE HFA 108 (90 BASE) MCG/ACT IN AERS: 2.0000 | INHALATION_SPRAY | RESPIRATORY_TRACT | 3 refills | Status: DC | PRN

## 2023-08-25 MED ORDER — PSEUDOEPH-BROMPHEN-DM 30-2-10 MG/5ML PO SYRP: 5.0000 mL | ORAL_SOLUTION | Freq: Four times a day (QID) | ORAL | 0 refills | Status: DC | PRN

## 2023-08-25 NOTE — Patient Instructions (Addendum)
Thank you for coming to the office today.  Ordered generic Advair high dose refills  Refilled [place] rescue  Cough syrup   Start Augmentin and Prednisone if not improved as well.  We can consider contacting our pharmacist Gentry Fitz if needed for cost / coverage on inhalers.  Referral to Pulmonology as well  [facility] at [facility] [address] [place],  [place]  [address] Main: [phone]  --------------  Try 1 week sample Contrave - Week 1: 1 pill daily with meal - Week 2: 1 pill twice a day with meal - Week 3: 2 pill with meal in AM and 1 pill with PM meal - Week 4: 2 pill twice a day with meal   Please notify me after 1 week, and if doing well on Contrave, I can order to [facility] pharmacy $99 per month plan with this med.     Please schedule a Follow-up Appointment to: Return if symptoms worsen or fail to improve.  If you have any other questions or concerns, please feel free to call the office or send a message through MyChart. You may also schedule an earlier appointment if necessary.  Additionally, you may be receiving a survey about your experience at our office within a few days to 1 week by e-mail or mail. We value your feedback.  [name], DO [facility], [place]

## 2023-08-25 NOTE — Progress Notes (Signed)
Derek Cook (Key: [id number]) PA Case ID #: [id number] Rx #: [id number] Need Help? Call us at [phone] Status sent iconSent to Plan today Drug Pseudoeph-Bromphen-DM 30-2-10MG /5ML syrup ePA cloud logo Form [employer] [place] Commercial Electronic Request Form Original Claim Info [id number] 01OTC ALTERNATIVE AVAILABLE DAILY DOSE OF .001000

## 2023-08-25 NOTE — Progress Notes (Signed)
Subjective:    Patient ID: Derek Cook, male    DOB: [birth year], 62 y.o.   MRN: [id number]  Derek Cook is a 62 y.o. male presenting on 08/25/2023 for Sore Throat (Headache sore throat 2 days before Thanksgiving. Went away started coming back a few days ago. Heaviness in chest. Coughing )   HPI  Discussed the use of AI scribe software for clinical note transcription with the patient, who gave verbal consent to proceed.   The patient, with a history of COPD, presented with a prolonged respiratory illness that began a few days before Thanksgiving. He initially experienced a sore throat and heavy congestion, which progressed to increased pressure and mucus build-up in the chest. The following week, the patient began to cough up a significant amount of thick, grayish-white mucus, which later turned yellow. After a brief period of symptom resolution, the patient's symptoms returned, this time with a sensation of water trickling down into the chest and a feeling of congestion. The patient also experienced a prolonged coughing spell that resulted in rib pain and lightheadedness.  The patient denied having a fever, with temperatures remaining within the normal range. He attempted to manage his symptoms with inhalers, Claritin, cough syrup, and Nyquil tablets. Despite initial improvement, the patient's symptoms returned.  The patient also reported concerns about his inhaler regimen, specifically the cost and effectiveness of his current medications. He expressed interest in a pulmonary checkup due to a decrease in exercise tolerance and a potential diagnosis of COPD mentioned during a previous hospitalization for COVID-19.   Weight Management Morbid Obesity BMI >40 Previously tried Semaglutide injections, compounded, limited results Interested in oral options. Has tried and failed lifestyle modifications physician directed exercise and diet regimen >6 months + More sedentary at work  now       04/30/2023    1:18 PM 06/16/2022    9:55 AM 04/28/2022   10:43 AM  Depression screen PHQ 2/9  Decreased Interest 0 0 0  Down, Depressed, Hopeless 0 0 0  PHQ - 2 Score 0 0 0  Altered sleeping  0 0  Tired, decreased energy  0 0  Change in appetite  0 0  Feeling bad or failure about yourself   0 0  Trouble concentrating  0 0  Moving slowly or fidgety/restless  0 0  Suicidal thoughts  0 0  PHQ-9 Score  0 0  Difficult doing work/chores  Not difficult at all Not difficult at all       06/16/2022    9:55 AM 04/28/2022   10:43 AM 01/14/2021    1:19 PM  GAD 7 : Generalized Anxiety Score  Nervous, Anxious, on Edge 0 0 0  Control/stop worrying 0 0 0  Worry too much - different things 0 0 0  Trouble relaxing 0 0 0  Restless 0 0 0  Easily annoyed or irritable 0 0 0  Afraid - awful might happen 0 0 0  Total GAD 7 Score 0 0 0  Anxiety Difficulty Not difficult at all Not difficult at all Not difficult at all    Social History   Tobacco Use   Smoking status: Former    Current packs/day: 0.00    Types: Cigarettes    Quit date: 09/07/2009    Years since quitting: 13.9   Smokeless tobacco: Former  Building services engineer status: Never Used  Substance Use Topics   Alcohol use: No   Drug use: No  Review of Systems Per HPI unless specifically indicated above     Objective:    BP (!) 142/78 (BP Location: Left Arm, Cuff Size: Large)   Pulse 100   Ht 5\' 8"  (1.727 m)   Wt 267 lb (121.1 kg)   SpO2 97%   BMI 40.60 kg/m   Wt Readings from Last 3 Encounters:  08/25/23 267 lb (121.1 kg)  04/30/23 265 lb (120.2 kg)  03/23/23 263 lb (119.3 kg)    Physical Exam Vitals and nursing note reviewed.  Constitutional:      General: He is not in acute distress.    Appearance: Normal appearance. He is well-developed. He is not diaphoretic.     Comments: Well-appearing, comfortable, cooperative  HENT:     Head: Normocephalic and atraumatic.  Eyes:     General:        Right  eye: No discharge.        Left eye: No discharge.     Conjunctiva/sclera: Conjunctivae normal.  Neck:     Thyroid: No thyromegaly.  Cardiovascular:     Rate and Rhythm: Normal rate and regular rhythm.     Pulses: Normal pulses.     Heart sounds: Normal heart sounds. No murmur heard. Pulmonary:     Effort: Pulmonary effort is normal. No respiratory distress.     Breath sounds: Wheezing present. No rales.  Musculoskeletal:        General: Normal range of motion.     Cervical back: Normal range of motion and neck supple.  Lymphadenopathy:     Cervical: No cervical adenopathy.  Skin:    General: Skin is warm and dry.     Findings: No erythema or rash.  Neurological:     Mental Status: He is alert and oriented to person, place, and time. Mental status is at baseline.  Psychiatric:        Mood and Affect: Mood normal.        Behavior: Behavior normal.        Thought Content: Thought content normal.     Comments: Well groomed, good eye contact, normal speech and thoughts      Results for orders placed or performed during the hospital encounter of 02/28/23  Urine Culture   Collection Time: 02/28/23  8:41 AM   Specimen: Urine, Clean Catch  Result Value Ref Range   Specimen Description      URINE, CLEAN CATCH Performed at [facility], [address] [place], [place] [address]    Special Requests      NONE Performed at [facility], [address] [place], [place] [address]    Culture      NO GROWTH Performed at [facility], [address] [place], [place] [address]    Report Status 03/01/2023 FINAL   Urinalysis, w/ Reflex to Culture (Infection Suspected) -Urine, Clean Catch   Collection Time: 02/28/23  8:41 AM  Result Value Ref Range   Specimen Source URINE, CLEAN CATCH    Color, Urine YELLOW YELLOW   APPearance CLEAR CLEAR   Specific Gravity, Urine 1.025 1.005 - 1.030   pH 7.0 5.0 - 8.0   Glucose, UA NEGATIVE NEGATIVE mg/dL   Hgb  urine dipstick TRACE (A) NEGATIVE   Bilirubin Urine NEGATIVE NEGATIVE   Ketones, ur NEGATIVE NEGATIVE mg/dL   Protein, ur NEGATIVE NEGATIVE mg/dL   Nitrite NEGATIVE NEGATIVE   Leukocytes,Ua TRACE (A) NEGATIVE   Squamous Epithelial / HPF 0-5  0 - 5 /HPF   WBC, UA 6-10 0 - 5 WBC/hpf   RBC / HPF 6-10 0 - 5 RBC/hpf   Bacteria, UA FEW (A) NONE SEEN   Mucus PRESENT   SARS Coronavirus 2 by RT PCR (hospital order, performed in [facility] hospital lab) *cepheid single result test* Anterior Nasal Swab   Collection Time: 02/28/23  9:26 AM   Specimen: Anterior Nasal Swab  Result Value Ref Range   SARS Coronavirus 2 by RT PCR NEGATIVE NEGATIVE      Assessment & Plan:   Problem List Items Addressed This Visit     Mild persistent asthma - Primary   Relevant Medications   albuterol (VENTOLIN HFA) 108 (90 Base) MCG/ACT inhaler   fluticasone-salmeterol (ADVAIR) 500-50 MCG/ACT AEPB   predniSONE (DELTASONE) 20 MG tablet   Other Relevant Orders   Ambulatory referral to Pulmonology   Morbid obesity (HCC)   Relevant Medications   Naltrexone-buPROPion HCl ER (CONTRAVE) 8-90 MG TB12   Other Visit Diagnoses       Acute non-recurrent frontal sinusitis       Relevant Medications   amoxicillin-clavulanate (AUGMENTIN) 875-125 MG tablet   predniSONE (DELTASONE) 20 MG tablet   brompheniramine-pseudoephedrine-DM 30-2-10 MG/5ML syrup         Sinusitis Duration >2+ weeks Persistent cough with productive sputum, initially white then yellow. No fever. Symptoms improved then worsened. Possible initial viral infection with secondary bacterial infection. -Start antibiotic therapy. -Continue use of inhalers and over-the-counter cough syrup.  Asthma persistent Use of Albuterol and generic Advair. High cost of Airsupra inhaler. -Refill Albuterol and generic Advair. -Consider consultation with clinical pharmacist for cost-effective inhaler options.  History of Asthma vs COPD Previous diagnosis by  another physician. Decreased exercise tolerance. -Referral to pulmonology for further evaluation and management.  Weight Management Morbid Obesity Interest in appetite suppressant medication. -Provide sample of Contrave for trial use. 7 day Consider rx if patient prefers, $99 specialty pharmacy  General Health Maintenance -Off work note for 08/26/2023.        Orders Placed This Encounter  Procedures   Ambulatory referral to Pulmonology    Referral Priority:   Routine    Referral Type:   Consultation    Referral Reason:   Specialty Services Required    Requested Specialty:   Pulmonary Disease    Number of Visits Requested:   1    Meds ordered this encounter  Medications   albuterol (VENTOLIN HFA) 108 (90 Base) MCG/ACT inhaler    Sig: Inhale 2 puffs into the lungs every 4 (four) hours as needed for wheezing or shortness of breath.    Dispense:  8.5 g    Refill:  3   fluticasone-salmeterol (ADVAIR) 500-50 MCG/ACT AEPB    Sig: Inhale 1 puff into the lungs in the morning and at bedtime.    Dispense:  180 each    Refill:  1   amoxicillin-clavulanate (AUGMENTIN) 875-125 MG tablet    Sig: Take 1 tablet by mouth 2 (two) times daily.    Dispense:  20 tablet    Refill:  0   predniSONE (DELTASONE) 20 MG tablet    Sig: Take daily with food. Start with 60mg  (3 pills) x 2 days, then reduce to 40mg  (2 pills) x 2 days, then 20mg  (1 pill) x 3 days    Dispense:  13 tablet    Refill:  0   brompheniramine-pseudoephedrine-DM 30-2-10 MG/5ML syrup    Sig: Take 5 mLs by mouth 4 (  four) times daily as needed.    Dispense:  118 mL    Refill:  0   Naltrexone-buPROPion HCl ER (CONTRAVE) 8-90 MG TB12    Sig: Start 1 tablet every morning for 7 days, then 1 tablet twice daily for 7 days, then 2 tablets every morning and one every evening    Follow up plan: Return if symptoms worsen or fail to improve.   [name], DO [facility] 08/25/2023, 2:02 PM

## 2023-08-26 NOTE — Progress Notes (Signed)
Patient paid for medication out of pocket.   Denied through [employer]

## 2023-09-02 ENCOUNTER — Ambulatory Visit: Payer: Self-pay

## 2023-09-02 NOTE — Telephone Encounter (Signed)
Message from [place] F sent at 09/02/2023  9:59 AM EST  Summary: Medication not working.   Pt is calling in because they saw Dr. Kirtland Cook last week and says that the medication he received hasn't been working. Pt says he is still coughing and his throat is extremely raw and he wants to know what he should do next.         Chief Complaint: cough, sore throat Symptoms: throat feels raw, SOB with exertion and when talking too long Frequency: 08/02/23 Pertinent Negatives: Patient denies fever Disposition: [] ED /[] Urgent Care (no appt availability in office) / [x] Appointment(In office/virtual)/ []  [facility]/ [] Home Care/ [] Refused Recommended Disposition /[] [facility]/ []  Follow-up with PCP Additional Notes: called office since resp. Issue to see if pt could be worked in. Rachell was able to find appt for 1000. Pt advised to arrive at 0945 to check in.   Reason for Disposition  [1] Known COPD or other severe lung disease (i.e., bronchiectasis, cystic fibrosis, lung surgery) AND [2] worsening symptoms (i.e., increased sputum purulence or amount, increased breathing difficulty  Answer Assessment - Initial Assessment Questions 1. ONSET: "When did the cough begin?"      08/02/23 2. SEVERITY: "How bad is the cough today?"      Moderate  3. SPUTUM: "Describe the color of your sputum" (none, dry cough; clear, white, yellow, green)     rust 4. HEMOPTYSIS: "Are you coughing up any blood?" If so ask: "How much?" (flecks, streaks, tablespoons, etc.)     Maybe some blood  5. DIFFICULTY BREATHING: "Are you having difficulty breathing?" If Yes, ask: "How bad is it?" (e.g., mild, moderate, severe)    - MILD: No SOB at rest, mild SOB with walking, speaks normally in sentences, can lie down, no retractions, pulse < 100.    - MODERATE: SOB at rest, SOB with minimal exertion and prefers to sit, cannot lie down flat, speaks in phrases, mild retractions, audible wheezing, pulse 100-120.    -  SEVERE: Very SOB at rest, speaks in single words, struggling to breathe, sitting hunched forward, retractions, pulse > 120      mild 6. FEVER: "Do you have a fever?" If Yes, ask: "What is your temperature, how was it measured, and when did it start?"     no 7. CARDIAC HISTORY: "Do you have any history of heart disease?" (e.g., heart attack, congestive heart failure)      CHF 8. LUNG HISTORY: "Do you have any history of lung disease?"  (e.g., pulmonary embolus, asthma, emphysema)     yes 10. OTHER SYMPTOMS: "Do you have any other symptoms?" (e.g., runny nose, wheezing, chest pain)       wheezing  Protocols used: Cough - Acute Productive-A-AH

## 2023-09-03 ENCOUNTER — Encounter: Payer: Self-pay | Admitting: Family Medicine

## 2023-09-03 ENCOUNTER — Ambulatory Visit: Payer: BC Managed Care – PPO | Admitting: Family Medicine

## 2023-09-03 VITALS — BP 130/76 | HR 80 | Ht 68.0 in | Wt 268.0 lb

## 2023-09-03 DIAGNOSIS — J0110 Acute frontal sinusitis, unspecified: Secondary | ICD-10-CM | POA: Diagnosis not present

## 2023-09-03 DIAGNOSIS — J4530 Mild persistent asthma, uncomplicated: Secondary | ICD-10-CM | POA: Diagnosis not present

## 2023-09-03 MED ORDER — PREDNISONE 20 MG PO TABS: ORAL_TABLET | ORAL | 0 refills | Status: DC

## 2023-09-03 MED ORDER — GUAIFENESIN-CODEINE 100-10 MG/5ML PO SOLN: 5.0000 mL | Freq: Three times a day (TID) | ORAL | 0 refills | Status: DC | PRN

## 2023-09-03 MED ORDER — DOXYCYCLINE HYCLATE 100 MG PO TABS: 100.0000 mg | ORAL_TABLET | Freq: Two times a day (BID) | ORAL | 0 refills | Status: DC

## 2023-09-03 NOTE — Progress Notes (Signed)
Subjective:    Patient ID: Derek Cook, male    DOB: 1961/01/13, 62 y.o.   MRN: [id number]  Derek Cook is a 62 y.o. male presenting on 09/03/2023 for Cough   HPI  Discussed the use of AI scribe software for clinical note transcription with the patient, who gave verbal consent to proceed.  History of Present Illness     The patient, with a history of respiratory issues, presented with persistent coughing, wheezing, and a severe sore throat. He reported that these symptoms had been ongoing for approximately a week and a half prior to the consultation. Despite being prescribed amoxicillin and prednisone, the patient's symptoms persisted. He noted that the prednisone was particularly helpful in managing his symptoms, allowing him to sleep at night  The patient described his throat as feeling raw, likely due to constant coughing and post-nasal drip. He also reported a sensation of continuous drainage, which he believed was contributing to the rawness of his throat. Despite the discomfort, the patient denied any signs of a bacterial throat infection.  In addition to the respiratory symptoms, the patient reported that his left eye had been unusually watery and experienced pressure. However, he denied any involvement of the ears in his current illness. The patient also noted that his cough worsened at night, often disrupting his sleep.  The patient had been relatively inactive due to his symptoms and had limited his social interactions to reduce the risk of contagion. He had been adhering to his prescribed treatment regimen, which included inhalers, but expressed a desire for additional treatment options to alleviate his severe sore throat. He also expressed interest in trying a cough syrup with codeine to manage his persistent cough.          04/30/2023    1:18 PM 06/16/2022    9:55 AM 04/28/2022   10:43 AM  Depression screen PHQ 2/9  Decreased Interest 0 0 0  Down, Depressed, Hopeless  0 0 0  PHQ - 2 Score 0 0 0  Altered sleeping  0 0  Tired, decreased energy  0 0  Change in appetite  0 0  Feeling bad or failure about yourself   0 0  Trouble concentrating  0 0  Moving slowly or fidgety/restless  0 0  Suicidal thoughts  0 0  PHQ-9 Score  0 0  Difficult doing work/chores  Not difficult at all Not difficult at all       06/16/2022    9:55 AM 04/28/2022   10:43 AM 01/14/2021    1:19 PM  GAD 7 : Generalized Anxiety Score  Nervous, Anxious, on Edge 0 0 0  Control/stop worrying 0 0 0  Worry too much - different things 0 0 0  Trouble relaxing 0 0 0  Restless 0 0 0  Easily annoyed or irritable 0 0 0  Afraid - awful might happen 0 0 0  Total GAD 7 Score 0 0 0  Anxiety Difficulty Not difficult at all Not difficult at all Not difficult at all    Social History   Tobacco Use   Smoking status: Former    Current packs/day: 0.00    Types: Cigarettes    Quit date: 09/07/2009    Years since quitting: 13.9   Smokeless tobacco: Former  Building services engineer status: Never Used  Substance Use Topics   Alcohol use: No   Drug use: No    Review of Systems Per HPI unless specifically indicated  above     Objective:    BP 130/76   Pulse 80   Ht 5\' 8"  (1.727 m)   Wt 268 lb (121.6 kg)   BMI 40.75 kg/m   Wt Readings from Last 3 Encounters:  09/03/23 268 lb (121.6 kg)  08/25/23 267 lb (121.1 kg)  04/30/23 265 lb (120.2 kg)    Physical Exam Vitals and nursing note reviewed.  Constitutional:      General: He is not in acute distress.    Appearance: He is well-developed. He is not diaphoretic.     Comments: Well-appearing, comfortable, cooperative  HENT:     Head: Normocephalic and atraumatic.  Eyes:     General:        Right eye: No discharge.        Left eye: No discharge.     Conjunctiva/sclera: Conjunctivae normal.  Neck:     Thyroid: No thyromegaly.  Cardiovascular:     Rate and Rhythm: Normal rate and regular rhythm.     Pulses: Normal pulses.      Heart sounds: Normal heart sounds. No murmur heard. Pulmonary:     Effort: Pulmonary effort is normal. No respiratory distress.     Breath sounds: No wheezing or rales.     Comments: Occasional cough. Improved wheezing Musculoskeletal:        General: Normal range of motion.     Cervical back: Normal range of motion and neck supple.  Lymphadenopathy:     Cervical: No cervical adenopathy.  Skin:    General: Skin is warm and dry.     Findings: No erythema or rash.  Neurological:     Mental Status: He is alert and oriented to person, place, and time. Mental status is at baseline.  Psychiatric:        Behavior: Behavior normal.     Comments: Well groomed, good eye contact, normal speech and thoughts     Results for orders placed or performed during the hospital encounter of 02/28/23  Urine Culture   Collection Time: 02/28/23  8:41 AM   Specimen: Urine, Clean Catch  Result Value Ref Range   Specimen Description      URINE, CLEAN CATCH Performed at [facility], [address], [place], [place] [address]    Special Requests      NONE Performed at [facility], [address] [place], [place] [address]    Culture      NO GROWTH Performed at [facility], [address] [place], [place] [address]    Report Status 03/01/2023 FINAL   Urinalysis, w/ Reflex to Culture (Infection Suspected) -Urine, Clean Catch   Collection Time: 02/28/23  8:41 AM  Result Value Ref Range   Specimen Source URINE, CLEAN CATCH    Color, Urine YELLOW YELLOW   APPearance CLEAR CLEAR   Specific Gravity, Urine 1.025 1.005 - 1.030   pH 7.0 5.0 - 8.0   Glucose, UA NEGATIVE NEGATIVE mg/dL   Hgb urine dipstick TRACE (A) NEGATIVE   Bilirubin Urine NEGATIVE NEGATIVE   Ketones, ur NEGATIVE NEGATIVE mg/dL   Protein, ur NEGATIVE NEGATIVE mg/dL   Nitrite NEGATIVE NEGATIVE   Leukocytes,Ua TRACE (A) NEGATIVE   Squamous Epithelial / HPF 0-5 0 - 5 /HPF   WBC, UA 6-10 0 - 5  WBC/hpf   RBC / HPF 6-10 0 - 5 RBC/hpf   Bacteria, UA FEW (A) NONE SEEN   Mucus PRESENT   SARS Coronavirus  2 by RT PCR (hospital order, performed in [facility] hospital lab) *cepheid single result test* Anterior Nasal Swab   Collection Time: 02/28/23  9:26 AM   Specimen: Anterior Nasal Swab  Result Value Ref Range   SARS Coronavirus 2 by RT PCR NEGATIVE NEGATIVE      Assessment & Plan:   Problem List Items Addressed This Visit     Mild persistent asthma - Primary   Relevant Medications   predniSONE (DELTASONE) 20 MG tablet   guaiFENesin-codeine (VIRTUSSIN A/C) 100-10 MG/5ML syrup   Other Visit Diagnoses       Acute non-recurrent frontal sinusitis       Relevant Medications   predniSONE (DELTASONE) 20 MG tablet   guaiFENesin-codeine (VIRTUSSIN A/C) 100-10 MG/5ML syrup   doxycycline (VIBRA-TABS) 100 MG tablet        Acute sinusitis / asthma exacerbation / coughing  Persistent cough, wheezing, and sore throat despite treatment with amoxicillin and prednisone. Patient reports improvement with prednisone and has been self-administering additional doses found at home. Throat appears raw but no signs of bacterial infection. Lungs sound improved with less wheezing. -Extend prednisone for an additional 5 days. -Start doxycycline to cover potential bacterial causes not targeted by amoxicillin. -Add cough syrup with codeine for symptomatic relief. -Already Refer to pulmonology for further evaluation. Pending apt, he will call to schedule, if not successful we can contact our referral coordinator  Eye Irritation Patient reports left eye has been watery and experiencing pressure. -Recommend follow-up with eye doctor if symptoms persist.         No orders of the defined types were placed in this encounter.   Meds ordered this encounter  Medications   predniSONE (DELTASONE) 20 MG tablet    Sig: Take daily with food. Start with 60mg  (3 pills) x 2 days, then reduce to 40mg  (2  pills) x 2 days, then 20mg  (1 pill) x 1 days. Total 5 day course.    Dispense:  11 tablet    Refill:  0   guaiFENesin-codeine (VIRTUSSIN A/C) 100-10 MG/5ML syrup    Sig: Take 5 mLs by mouth 3 (three) times daily as needed for cough.    Dispense:  120 mL    Refill:  0   doxycycline (VIBRA-TABS) 100 MG tablet    Sig: Take 1 tablet (100 mg total) by mouth 2 (two) times daily. For 10 days. Take with full glass of water, stay upright 30 min after taking.    Dispense:  20 tablet    Refill:  0    Follow up plan: Return if symptoms worsen or fail to improve.   [name], DO [facility] 09/03/2023, 10:04 AM

## 2023-09-03 NOTE — Patient Instructions (Addendum)
Thank you for coming to the office today.  Extend antibiotic with Doxycycline and also extend prednisone by 5 more days.  Start taking Doxycycline antibiotic 100mg  twice daily for 10 days. Take with full glass of water and stay upright for at least 30 min after taking, may be seated or standing, but should NOT lay down. This is just a safety precaution, if this medicine does not go all the way down throat well it could cause some burning discomfort to throat and esophagus. Also NOTE - do not take medicine within 2 hours (before or after) consuming dairy or foods / vitamins containing high calcium or iron.  [facility] at [facility] [address] [place],  [place]  [address] Main: [phone]  Trial on Codeine cough syrup  Please schedule a Follow-up Appointment to: Return if symptoms worsen or fail to improve.  If you have any other questions or concerns, please feel free to call the office or send a message through MyChart. You may also schedule an earlier appointment if necessary.  Additionally, you may be receiving a survey about your experience at our office within a few days to 1 week by e-mail or mail. We value your feedback.  [name], DO [facility] [place]

## 2023-09-09 ENCOUNTER — Telehealth: Payer: Self-pay | Admitting: Family Medicine

## 2023-09-09 ENCOUNTER — Ambulatory Visit
Admission: RE | Admit: 2023-09-09 | Discharge: 2023-09-09 | Disposition: A | Discharge status: Home / Self Care | Payer: BC Managed Care – PPO | Attending: Internal Medicine | Admitting: Internal Medicine

## 2023-09-09 ENCOUNTER — Ambulatory Visit: Payer: Self-pay

## 2023-09-09 ENCOUNTER — Ambulatory Visit
Admission: RE | Admit: 2023-09-09 | Discharge: 2023-09-09 | Disposition: A | Discharge status: Home / Self Care | Payer: BC Managed Care – PPO | Source: Ambulatory Visit | Attending: Internal Medicine | Admitting: Internal Medicine

## 2023-09-09 DIAGNOSIS — R0602 Shortness of breath: Secondary | ICD-10-CM

## 2023-09-09 NOTE — Telephone Encounter (Signed)
 Chief Complaint: SOB not getting better Symptoms: cough, SOB with activity Frequency: Ongoing for a couple of weeks Pertinent Negatives: Patient denies other symptoms Disposition: [] ED /[] Urgent Care (no appt availability in office) / [x] Appointment(In office/virtual)/ []  [facility]/ [] Home Care/ [x] Refused Recommended Disposition /[] [facility]/ []  Follow-up with PCP Additional Notes: Patient was seen in office on 09/03/23 for cough, SOB. He says he still has some antibiotic lift, prednisone  complete, cough medicine is helping some, but the SOB is worrisome and he would like to have a chest x-ray done. Advised OV, he denied saying all he called for was to get a referral to have a chest x-ray somewhere. Advised I will send this to Dr. Edman and someone will call with his recommendation.   Reason for Disposition  [1] MODERATE longstanding difficulty breathing (e.g., speaks in phrases, SOB even at rest, pulse 100-120) AND [2] SAME as normal  Answer Assessment - Initial Assessment Questions 1. RESPIRATORY STATUS: Describe your breathing? (e.g., wheezing, shortness of breath, unable to speak, severe coughing)      SOB, cough 2. ONSET: When did this breathing problem begin?      Ongoing for a couple of weeks 3. PATTERN Does the difficult breathing come and go, or has it been constant since it started?      Come and go with activity 4. SEVERITY: How bad is your breathing? (e.g., mild, moderate, severe)    - MILD: No SOB at rest, mild SOB with walking, speaks normally in sentences, can lie down, no retractions, pulse < 100.    - MODERATE: SOB at rest, SOB with minimal exertion and prefers to sit, cannot lie down flat, speaks in phrases, mild retractions, audible wheezing, pulse 100-120.    - SEVERE: Very SOB at rest, speaks in single words, struggling to breathe, sitting hunched forward, retractions, pulse > 120      Mild 5. RECURRENT SYMPTOM: Have you had  difficulty breathing before? If Yes, ask: When was the last time? and What happened that time?      Saw PCP on 09/03/23 6. CARDIAC HISTORY: Do you have any history of heart disease? (e.g., heart attack, angina, bypass surgery, angioplasty)      No 7. LUNG HISTORY: Do you have any history of lung disease?  (e.g., pulmonary embolus, asthma, emphysema)     Asthma 8. OTHER SYMPTOMS: Do you have any other symptoms? (e.g., dizziness, runny nose, cough, chest pain, fever)     Cough  Protocols used: Breathing Difficulty-A-AH

## 2023-09-09 NOTE — Telephone Encounter (Signed)
 Pt called in for chest imaging results, I let him know he will be called when Dr Kirtland Bouchard reviews results

## 2023-09-10 NOTE — Telephone Encounter (Signed)
 Please call patient back and see what else is needed for him.  He was seen by me on 09/03/23. Chest X-ray was done on 09/09/23.  Result was in on 09/09/23. I was out on admin time in afternoon and did not get to result before today.  He already called and got results as Angeline Laura, FNP submitted the result note on it.  Chest X-ray is negative for cause of his cough and shortness of breath.  I have nothing else to offer at this point. He has taken antibiotics, cough syrups, cough medicines, prednisone , and inhalers.  He has his been referred to Pulmonologist, apt is on 09/30/23.  Let me know what other questions he has or how else I can try to help.  Marsa Officer, DO [facility] 09/10/2023, 3:13 PM

## 2023-09-10 NOTE — Telephone Encounter (Signed)
 Pt called back and was upset that he doesn't have the results Went ahead and and gave him the read form Angeline Laura NP. Pt advised of message. Needs to know the plan of care and refused to go to ED and UC. Pt coughed throughout, post nasal drip. Severe cramping and can feel his ribs and the spasms. Advised pt to begin Ibuprofen  and take as directed on the directions per manufacturer's recommendation. Also advised Tylenol  ES to take 2 hours after the dose of Ibuprofen and givens same advise regarding dose and frequency. Advised splinting his chest using 2 pillows to help with pain.

## 2023-09-17 ENCOUNTER — Encounter: Payer: Self-pay | Admitting: Family Medicine

## 2023-09-17 ENCOUNTER — Ambulatory Visit: Payer: BC Managed Care – PPO | Admitting: Family Medicine

## 2023-09-17 ENCOUNTER — Telehealth: Payer: Self-pay | Admitting: Family Medicine

## 2023-09-17 VITALS — BP 132/80 | HR 94 | Ht 68.0 in | Wt 266.0 lb

## 2023-09-17 DIAGNOSIS — Z572 Occupational exposure to dust: Secondary | ICD-10-CM | POA: Diagnosis not present

## 2023-09-17 DIAGNOSIS — J4530 Mild persistent asthma, uncomplicated: Secondary | ICD-10-CM

## 2023-09-17 DIAGNOSIS — R0602 Shortness of breath: Secondary | ICD-10-CM | POA: Diagnosis not present

## 2023-09-17 DIAGNOSIS — Z77098 Contact with and (suspected) exposure to other hazardous, chiefly nonmedicinal, chemicals: Secondary | ICD-10-CM

## 2023-09-17 NOTE — Telephone Encounter (Signed)
Note done and sent via MyChart.

## 2023-09-17 NOTE — Patient Instructions (Addendum)
 Thank you for coming to the office today.  Upcoming appointment with Dr Isadora on 09/30/23 Keep this appointment  Breztri 2 puff twice a day.  Please schedule a Follow-up Appointment to: Return if symptoms worsen or fail to improve.  If you have any other questions or concerns, please feel free to call the office or send a message through MyChart. You may also schedule an earlier appointment if necessary.  Additionally, you may be receiving a survey about your experience at our office within a few days to 1 week by e-mail or mail. We value your feedback.  Marsa Officer, DO [facility], [place]

## 2023-09-17 NOTE — Progress Notes (Signed)
 Subjective:    Patient ID: Derek Cook, male    DOB: 09-Jan-1961, 63 y.o.   MRN: [id number]  Derek Cook is a 63 y.o. male presenting on 09/17/2023 for Shortness of Breath   HPI  Discussed the use of AI scribe software for clinical note transcription with the patient, who gave verbal consent to proceed.  History of Present Illness     The patient, with a history of respiratory illness, presents with a chief complaint of shortness of breath, particularly upon exertion. 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 The patient notes that this breathlessness is not accompanied by wheezing. He reports that the shortness of breath has been a long-standing issue, gradually worsening over the years, and now significantly impacting his ability to work.  Recent course with multiple visits virtual and in person in past 2+ months for similar respiratory symptoms, has been on various antibiotic courses, steroids, breathing treatments, cough medicine.  The patient has been taking prednisone , which he increased recently due to the worsening of his symptoms. He reports some improvement in his condition with the increased dosage, particularly noting less congestion when lying down to sleep.  The patient has a history of occupational exposure to dust and chemicals, which he believes may be contributing to his respiratory issues. 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 He reports that this chemical is corrosive, eating away at rubber gaskets and hoses, and he believes it may be affecting his respiratory health. He recalls that during the summer, when he was using this chemical more frequently, it seemed to exacerbate his breathlessness.  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 He believes this may have contributed to the onset of his respiratory issues. He denies any current exposure to cigarette smoke, noting that even minimal exposure triggers a coughing fit.  The patient expresses concern about his ability to continue working due to his respiratory issues. He is considering seeking other employment but is worried about passing the required physical examination due to his current respiratory status. He is also concerned about the financial implications of his health issues         04/30/2023    1:18 PM 06/16/2022    9:55 AM 04/28/2022   10:43 AM  Depression screen PHQ 2/9  Decreased Interest 0 0 0  Down, Depressed, Hopeless 0 0 0  PHQ - 2 Score 0 0 0  Altered sleeping  0 0  Tired, decreased energy  0 0  Change in appetite  0 0  Feeling bad or failure about yourself   0 0  Trouble concentrating  0 0  Moving slowly or fidgety/restless  0 0  Suicidal thoughts  0 0  PHQ-9 Score  0 0  Difficult doing work/chores  Not difficult at all Not difficult at all       06/16/2022    9:55 AM 04/28/2022   10:43 AM 01/14/2021    1:19 PM  GAD 7 : Generalized Anxiety Score  Nervous, Anxious, on Edge 0 0 0  Control/stop worrying 0 0 0  Worry too much - different things 0 0 0  Trouble relaxing 0 0 0  Restless 0 0 0  Easily annoyed or  irritable 0 0 0  Afraid - awful might happen 0 0 0  Total GAD 7 Score 0 0 0  Anxiety Difficulty Not difficult at all Not difficult at all Not difficult at all    Social History   Tobacco Use   Smoking status: Former    Current packs/day: 0.00    Types: Cigarettes    Quit date: 09/07/2009    Years since quitting: 14.0   Smokeless tobacco: Former  Building Services Engineer status: Never Used  Substance Use Topics   Alcohol use: No   Drug use: No    Review of Systems Per HPI unless specifically indicated above     Objective:    BP 132/80   Pulse 94   Ht 5' 8 (1.727 m)    Wt 266 lb (120.7 kg)   SpO2 98%   BMI 40.45 kg/m   Wt Readings from Last 3 Encounters:  09/17/23 266 lb (120.7 kg)  09/03/23 268 lb (121.6 kg)  08/25/23 267 lb (121.1 kg)    Physical Exam Vitals and nursing note reviewed.  Constitutional:      General: He is not in acute distress.    Appearance: He is well-developed. He is not diaphoretic.     Comments: Well-appearing, comfortable, cooperative  HENT:     Head: Normocephalic and atraumatic.  Eyes:     General:        Right eye: No discharge.        Left eye: No discharge.     Conjunctiva/sclera: Conjunctivae normal.  Neck:     Thyroid: No thyromegaly.  Cardiovascular:     Rate and Rhythm: Normal rate and regular rhythm.     Pulses: Normal pulses.     Heart sounds: Normal heart sounds. No murmur heard. Pulmonary:     Effort: Pulmonary effort is normal. No respiratory distress.     Breath sounds: Normal breath sounds. No wheezing or rales.  Musculoskeletal:        General: Normal range of motion.     Cervical back: Normal range of motion and neck supple.  Lymphadenopathy:     Cervical: No cervical adenopathy.  Skin:    General: Skin is warm and dry.     Findings: No erythema or rash.  Neurological:     Mental Status: He is alert and oriented to person, place, and time. Mental status is at baseline.  Psychiatric:        Behavior: Behavior normal.     Comments: Well groomed, good eye contact, normal speech and thoughts     Results for orders placed or performed during the hospital encounter of 02/28/23  Urine Culture   Collection Time: 02/28/23  8:41 AM   Specimen: Urine, Clean Catch  Result Value Ref Range   Specimen Description      URINE, CLEAN CATCH Performed at [facility], [address], [place], [place] [address]    Special Requests      NONE Performed at [facility], [address], [place], [place] [address]    Culture      NO GROWTH Performed at [facility],  [address] [place], [place] [address]    Report Status 03/01/2023 FINAL   Urinalysis, w/ Reflex to Culture (Infection Suspected) -Urine, Clean Catch   Collection Time: 02/28/23  8:41 AM  Result Value Ref Range   Specimen Source URINE, CLEAN CATCH    Color, Urine YELLOW YELLOW  APPearance CLEAR CLEAR   Specific Gravity, Urine 1.025 1.005 - 1.030   pH 7.0 5.0 - 8.0   Glucose, UA NEGATIVE NEGATIVE mg/dL   Hgb urine dipstick TRACE (A) NEGATIVE   Bilirubin Urine NEGATIVE NEGATIVE   Ketones, ur NEGATIVE NEGATIVE mg/dL   Protein, ur NEGATIVE NEGATIVE mg/dL   Nitrite NEGATIVE NEGATIVE   Leukocytes,Ua TRACE (A) NEGATIVE   Squamous Epithelial / HPF 0-5 0 - 5 /HPF   WBC, UA 6-10 0 - 5 WBC/hpf   RBC / HPF 6-10 0 - 5 RBC/hpf   Bacteria, UA FEW (A) NONE SEEN   Mucus PRESENT   SARS Coronavirus 2 by RT PCR (hospital order, performed in [facility] hospital lab) *cepheid single result test* Anterior Nasal Swab   Collection Time: 02/28/23  9:26 AM   Specimen: Anterior Nasal Swab  Result Value Ref Range   SARS Coronavirus 2 by RT PCR NEGATIVE NEGATIVE      Assessment & Plan:   Problem List Items Addressed This Visit     Mild persistent asthma   Other Visit Diagnoses       Shortness of breath    -  Primary     Occupational exposure to dust         Occupational exposure to chemicals             Dyspnea, chronic Asthma mild persistent Chronic breathing problems. In past had diagnosis of COPD that was clinically made but does not have diagnostic work up. Also carries dx of asthma, has been on variety of respiratory treatments. Experiencing shortness of breath with exertion, high suspicion due to occupational exposure to dust and chemical release agent for asphalt.  Recent persistent URI and bronchospasm, has had multiple courses antibiotics, steroids, cough medicines, inhalers. Improvement noted with increased prednisone . CXR Negative  -Trial of Breztri inhaler, two puffs twice daily  for one week. Sample -On Advair  high dose Already has upcoming apt with Pulmonology Dr Isadora 09/30/23 for further diagnostic work up management.  Weight Management Discussed use of Contrave  for weight loss. -Continue Contrave  as directed, one pill daily for one week, then titrate as per standard protocol. If interested we can order rx to [facility] $99 per month.      No orders of the defined types were placed in this encounter.   No orders of the defined types were placed in this encounter.   Follow up plan: Return if symptoms worsen or fail to improve.   Marsa Officer, DO [facility] 09/17/2023, 12:04 PM

## 2023-09-17 NOTE — Telephone Encounter (Signed)
 Pt is calling to request a letter for work for today's appt. Pt is fine with the letter to be sent to his MyChart. Please advise CB- [phone]

## 2023-09-20 ENCOUNTER — Other Ambulatory Visit: Payer: Self-pay

## 2023-09-30 ENCOUNTER — Encounter: Payer: Self-pay | Admitting: Student in an Organized Health Care Education/Training Program

## 2023-09-30 ENCOUNTER — Ambulatory Visit: Payer: BC Managed Care – PPO | Admitting: Student in an Organized Health Care Education/Training Program

## 2023-09-30 VITALS — BP 138/80 | HR 92 | Temp 97.6°F | Ht 68.0 in | Wt 265.8 lb

## 2023-09-30 DIAGNOSIS — R0602 Shortness of breath: Secondary | ICD-10-CM | POA: Diagnosis not present

## 2023-09-30 DIAGNOSIS — J4540 Moderate persistent asthma, uncomplicated: Secondary | ICD-10-CM

## 2023-09-30 NOTE — Progress Notes (Signed)
Assessment & Plan:   1. Shortness of breath 2. Moderate persistent asthma without complication (Primary)  Presents for the evaluation of shortness of breath that I suspect is multifactorial.  His lung exam today is clear and he does not have any wheeze.  Patient further reports that symptoms are different from his previous asthma exacerbations.  He is overweight with significant central obesity and he associates weight gain with worsening in his dyspnea.  I will initiate the workup with a pulmonary function test with spirometry for obstruction and reversibility to assess asthma control.  I will also obtain lung volumes to assess for restriction from obesity as well as a DLCO.  Furthermore, given he has had multiple episodes of COVID with multi infiltrates, I will obtain a high-resolution chest CT to assess for any fibrotic changes that could have contributed to his symptoms.  In the interim, I have asked him to continue his Advair as he is currently doing.  - Pulmonary Function Test [facility] Only; Future - CT CHEST HIGH RESOLUTION; Future   Return in about 2 months (around 11/28/2023).  I spent 60 minutes caring for this patient today, including preparing to see the patient, obtaining a medical history , reviewing a separately obtained history, performing a medically appropriate examination and/or evaluation, counseling and educating the patient/family/caregiver, ordering medications, tests, or procedures, documenting clinical information in the electronic health record, and independently interpreting results (not separately reported/billed) and communicating results to the patient/family/caregiver  Raechel Chute, MD [facility] 09/30/2023 10:23 AM    End of visit medications:  No orders of the defined types were placed in this encounter.    Current Outpatient Medications:    albuterol (VENTOLIN HFA) 108 (90 Base) MCG/ACT inhaler, Inhale 2 puffs into the lungs every 4  (four) hours as needed for wheezing or shortness of breath., Disp: 8.5 g, Rfl: 3   fluticasone-salmeterol (ADVAIR) 500-50 MCG/ACT AEPB, Inhale 1 puff into the lungs in the morning and at bedtime., Disp: 180 each, Rfl: 1   guaiFENesin-codeine (VIRTUSSIN A/C) 100-10 MG/5ML syrup, Take 5 mLs by mouth 3 (three) times daily as needed for cough., Disp: 120 mL, Rfl: 0   hydrochlorothiazide (HYDRODIURIL) 25 MG tablet, Take 1 tablet (25 mg total) by mouth daily., Disp: 90 tablet, Rfl: 1   loratadine (CLARITIN) 10 MG tablet, Take 10 mg by mouth as needed for allergies., Disp: , Rfl:    Naltrexone-buPROPion HCl ER (CONTRAVE) 8-90 MG TB12, Start 1 tablet every morning for 7 days, then 1 tablet twice daily for 7 days, then 2 tablets every morning and one every evening, Disp: , Rfl:    potassium chloride SA (KLOR-CON M) 20 MEQ tablet, TAKE 1 TABLET BY MOUTH ONCE DAILY, Disp: 90 tablet, Rfl: 1   doxycycline (VIBRA-TABS) 100 MG tablet, Take 1 tablet (100 mg total) by mouth 2 (two) times daily. For 10 days. Take with full glass of water, stay upright 30 min after taking. (Patient not taking: Reported on 09/30/2023), Disp: 20 tablet, Rfl: 0   omeprazole (PRILOSEC) 40 MG capsule, TAKE 1 CAPSULE BY MOUTH ONCE DAILY (Patient not taking: Reported on 09/30/2023), Disp: 90 capsule, Rfl: 0   predniSONE (DELTASONE) 20 MG tablet, Take daily with food. Start with 60mg  (3 pills) x 2 days, then reduce to 40mg  (2 pills) x 2 days, then 20mg  (1 pill) x 1 days. Total 5 day course. (Patient not taking: Reported on 09/30/2023), Disp: 11 tablet, Rfl: 0   Subjective:  PATIENT ID: Derek Cook GENDER: male DOB: 06/24/1961, MRN: [id number]  Chief Complaint  Patient presents with   Consult    Shortness of breath on exertion. Cough.    HPI  The patient is a pleasant 63 year old male presenting to clinic for the evaluation of shortness of breath.  Patient reports having a history of asthma that started in childhood.  His asthma  symptoms were somewhat uncontrolled in his teens and 20s but subsequently improved drastically.  He was previously on inhalers and stopped them in his 30's given significant improvement in symptoms. With age, asthma symptoms recurred and he's been placed back on ICS/LABA. He is currently on Advair 500-50.  His shortness of breath, however, has worsened and is mostly with exertion. Previous asthma symptoms included cough, wheeze, and chest tightness which is not the case at the moment.  He now reports mostly exertional dyspnea especially when going up a ladder or doing strenuous activity.  He denies cough, wheeze, or sputum production.  Patient has been followed closely by his primary care physician and is maintained on high-dose ICS/LABA inhaler with Advair 500-50 which he reports helps a little but has not resolved his symptoms. He previously lost a few pounds which helped a lot with shortness of breath but he gained 14 pounds after stopping Wegovy. This weight gain is associated with increase in his shortness of breath.  He has had COVID multiple times, most severe being in 2021 where he was admitted to the hospital and a CT scan at that time showed multifocal patchy infiltrates.  Patient reports history of smoking, having smoked between 2001 and 2009 with around 15 to 20 pack years of smoking history over the course of his life.  He does report significant secondhand smoke exposure.  He denies any vape use.  He currently works driving a truck and reports exposure to chemicals at work that he feels triggered his shortness of breath.  Ancillary information including prior medications, full medical/surgical/family/social histories, and PFTs (when available) are listed below and have been reviewed.   Review of Systems  Constitutional:  Negative for chills, fever, malaise/fatigue and weight loss.  Respiratory:  Positive for shortness of breath. Negative for cough, hemoptysis, sputum production and wheezing.    Cardiovascular:  Negative for chest pain.     Objective:   Vitals:   09/30/23 0956  BP: 138/80  Pulse: 92  Temp: 97.6 F (36.4 C)  TempSrc: Temporal  SpO2: 96%  Weight: 265 lb 12.8 oz (120.6 kg)  Height: 5\' 8"  (1.727 m)   96% on RA  BMI Readings from Last 3 Encounters:  09/30/23 40.41 kg/m  09/17/23 40.45 kg/m  09/03/23 40.75 kg/m   Wt Readings from Last 3 Encounters:  09/30/23 265 lb 12.8 oz (120.6 kg)  09/17/23 266 lb (120.7 kg)  09/03/23 268 lb (121.6 kg)    Physical Exam Constitutional:      Appearance: Normal appearance. He is obese.  Cardiovascular:     Rate and Rhythm: Normal rate and regular rhythm.     Pulses: Normal pulses.     Heart sounds: Normal heart sounds.  Pulmonary:     Effort: Pulmonary effort is normal.     Breath sounds: Normal breath sounds. No wheezing.  Neurological:     General: No focal deficit present.     Mental Status: He is alert and oriented to person, place, and time. Mental status is at baseline.       Ancillary Information  Past Medical History:  Diagnosis Date   Allergy    Asthma    Colon polyp    GERD (gastroesophageal reflux disease)    Hyperlipidemia      Family History  Problem Relation Age of Onset   Cancer Mother        melanoma   Diabetes Mother    Hyperlipidemia Mother    Dementia Mother    Cancer Father    Cancer Sister    Heart disease Sister    Diabetes Brother    Hyperlipidemia Brother    Dementia Paternal Grandmother      Past Surgical History:  Procedure Laterality Date   CLAVICLE EXCISION     HERNIA REPAIR Bilateral 2004, 2005   Inguinal repair with mesh   WRIST ARTHROPLASTY      Social History   Socioeconomic History   Marital status: Married    Spouse name: Not on file   Number of children: Not on file   Years of education: Not on file   Highest education level: Not on file  Occupational History   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    Comment: Switching to day  shifts (from nights)  Tobacco Use   Smoking status: Former    Current packs/day: 0.00    Types: Cigarettes    Quit date: 09/07/2009    Years since quitting: 14.0   Smokeless tobacco: Former  Building services engineer status: Never Used  Substance and Sexual Activity   Alcohol use: No   Drug use: No   Sexual activity: Not on file  Other Topics Concern   Not on file  Social History Narrative   Not on file   Social Drivers of Health   Financial Resource Strain: Not on file  Food Insecurity: Not on file  Transportation Needs: Not on file  Physical Activity: Not on file  Stress: Not on file  Social Connections: Unknown (01/20/2022)   Received from [facility], [facility]   Social Network    Social Network: Not on file  Intimate Partner Violence: Unknown (12/12/2021)   Received from [facility], [facility]   HITS    Physically Hurt: Not on file    Insult or Talk Down To: Not on file    Threaten Physical Harm: Not on file    Scream or Curse: Not on file     Allergies  Allergen Reactions   Statins Other (See Comments)    myalgias   Azithromycin Hives   Shellfish-Derived Products    Sulfa Antibiotics      CBC    Component Value Date/Time   WBC 9.4 07/03/2022 1805   RBC 5.73 07/03/2022 1805   HGB 15.7 07/03/2022 1805   HGB 15.7 07/26/2014 1940   HCT 48.1 07/03/2022 1805   HCT 46.8 07/26/2014 1940   PLT 297 07/03/2022 1805   PLT 302 07/26/2014 1940   MCV 83.9 07/03/2022 1805   MCV 83 07/26/2014 1940   MCH 27.4 07/03/2022 1805   MCHC 32.6 07/03/2022 1805   RDW 13.7 07/03/2022 1805   RDW 14.0 07/26/2014 1940   LYMPHSABS 3.4 07/03/2022 1805   MONOABS 0.6 07/03/2022 1805   EOSABS 0.3 07/03/2022 1805   BASOSABS 0.1 07/03/2022 1805    Pulmonary Functions Testing Results:     No data to display          Outpatient Medications Prior to Visit  Medication Sig Dispense Refill   albuterol (VENTOLIN HFA) 108 (90 Base) MCG/ACT  inhaler Inhale 2 puffs into the  lungs every 4 (four) hours as needed for wheezing or shortness of breath. 8.5 g 3   fluticasone-salmeterol (ADVAIR) 500-50 MCG/ACT AEPB Inhale 1 puff into the lungs in the morning and at bedtime. 180 each 1   guaiFENesin-codeine (VIRTUSSIN A/C) 100-10 MG/5ML syrup Take 5 mLs by mouth 3 (three) times daily as needed for cough. 120 mL 0   hydrochlorothiazide (HYDRODIURIL) 25 MG tablet Take 1 tablet (25 mg total) by mouth daily. 90 tablet 1   loratadine (CLARITIN) 10 MG tablet Take 10 mg by mouth as needed for allergies.     Naltrexone-buPROPion HCl ER (CONTRAVE) 8-90 MG TB12 Start 1 tablet every morning for 7 days, then 1 tablet twice daily for 7 days, then 2 tablets every morning and one every evening     potassium chloride SA (KLOR-CON M) 20 MEQ tablet TAKE 1 TABLET BY MOUTH ONCE DAILY 90 tablet 1   doxycycline (VIBRA-TABS) 100 MG tablet Take 1 tablet (100 mg total) by mouth 2 (two) times daily. For 10 days. Take with full glass of water, stay upright 30 min after taking. (Patient not taking: Reported on 09/30/2023) 20 tablet 0   omeprazole (PRILOSEC) 40 MG capsule TAKE 1 CAPSULE BY MOUTH ONCE DAILY (Patient not taking: Reported on 09/30/2023) 90 capsule 0   predniSONE (DELTASONE) 20 MG tablet Take daily with food. Start with 60mg  (3 pills) x 2 days, then reduce to 40mg  (2 pills) x 2 days, then 20mg  (1 pill) x 1 days. Total 5 day course. (Patient not taking: Reported on 09/30/2023) 11 tablet 0   No facility-administered medications prior to visit.

## 2023-10-04 ENCOUNTER — Other Ambulatory Visit: Payer: Self-pay | Admitting: Student in an Organized Health Care Education/Training Program

## 2023-10-04 DIAGNOSIS — R0602 Shortness of breath: Secondary | ICD-10-CM

## 2023-10-04 DIAGNOSIS — J4540 Moderate persistent asthma, uncomplicated: Secondary | ICD-10-CM

## 2023-10-06 ENCOUNTER — Ambulatory Visit: Payer: BC Managed Care – PPO

## 2023-10-06 ENCOUNTER — Other Ambulatory Visit: Payer: BC Managed Care – PPO

## 2023-10-06 ENCOUNTER — Ambulatory Visit
Admission: RE | Admit: 2023-10-06 | Discharge: 2023-10-06 | Disposition: A | Discharge status: Home / Self Care | Payer: BC Managed Care – PPO | Source: Ambulatory Visit | Attending: Student in an Organized Health Care Education/Training Program | Admitting: Student in an Organized Health Care Education/Training Program

## 2023-10-06 ENCOUNTER — Inpatient Hospital Stay: Admission: RE | Admit: 2023-10-06 | Payer: BC Managed Care – PPO | Source: Ambulatory Visit

## 2023-10-06 DIAGNOSIS — R0602 Shortness of breath: Secondary | ICD-10-CM

## 2023-10-06 DIAGNOSIS — I2510 Atherosclerotic heart disease of native coronary artery without angina pectoris: Secondary | ICD-10-CM | POA: Diagnosis not present

## 2023-10-06 DIAGNOSIS — I700 Atherosclerosis of aorta: Secondary | ICD-10-CM | POA: Diagnosis not present

## 2023-10-06 DIAGNOSIS — J479 Bronchiectasis, uncomplicated: Secondary | ICD-10-CM | POA: Diagnosis not present

## 2023-10-06 DIAGNOSIS — J4540 Moderate persistent asthma, uncomplicated: Secondary | ICD-10-CM

## 2023-10-12 ENCOUNTER — Ambulatory Visit: Payer: BC Managed Care – PPO | Attending: Student in an Organized Health Care Education/Training Program

## 2023-10-12 DIAGNOSIS — R0602 Shortness of breath: Secondary | ICD-10-CM | POA: Insufficient documentation

## 2023-10-12 DIAGNOSIS — J4540 Moderate persistent asthma, uncomplicated: Secondary | ICD-10-CM | POA: Insufficient documentation

## 2023-10-12 MED ORDER — ALBUTEROL SULFATE (2.5 MG/3ML) 0.083% IN NEBU
2.5000 mg | INHALATION_SOLUTION | Freq: Once | RESPIRATORY_TRACT | Status: AC
  Administered 2023-10-12: 2.5 mg via RESPIRATORY_TRACT
  Filled 2023-10-12: qty 3

## 2023-10-19 LAB — PULMONARY FUNCTION TEST ARMC ONLY
DL/VA % pred: 107 %
DL/VA: 4.61 ml/min/mmHg/L
DLCO unc % pred: 75 %
DLCO unc: 19.54 ml/min/mmHg
FEF 25-75 Post: 3.93 L/s
FEF 25-75 Pre: 3.76 L/s
FEF2575-%Change-Post: 4 %
FEF2575-%Pred-Post: 140 %
FEF2575-%Pred-Pre: 134 %
FEV1-%Change-Post: 1 %
FEV1-%Pred-Post: 74 %
FEV1-%Pred-Pre: 73 %
FEV1-Post: 2.51 L
FEV1-Pre: 2.47 L
FEV1FVC-%Change-Post: 4 %
FEV1FVC-%Pred-Pre: 114 %
FEV6-%Change-Post: -2 %
FEV6-%Pred-Post: 66 %
FEV6-%Pred-Pre: 67 %
FEV6-Post: 2.8 L
FEV6-Pre: 2.86 L
FEV6FVC-%Pred-Post: 104 %
FEV6FVC-%Pred-Pre: 104 %
FVC-%Change-Post: -2 %
FVC-%Pred-Post: 63 %
FVC-%Pred-Pre: 64 %
FVC-Post: 2.8 L
FVC-Pre: 2.86 L
Post FEV1/FVC ratio: 90 %
Post FEV6/FVC ratio: 100 %
Pre FEV1/FVC ratio: 86 %
Pre FEV6/FVC Ratio: 100 %
RV % pred: 81 %
RV: 1.73 L
TLC % pred: 70 %
TLC: 4.67 L

## 2023-10-20 ENCOUNTER — Ambulatory Visit: Payer: BC Managed Care – PPO | Admitting: Student in an Organized Health Care Education/Training Program

## 2023-10-20 VITALS — BP 150/82 | HR 97 | Temp 97.6°F | Ht 68.0 in | Wt 271.0 lb

## 2023-10-20 DIAGNOSIS — R0602 Shortness of breath: Secondary | ICD-10-CM | POA: Diagnosis not present

## 2023-10-20 DIAGNOSIS — J4540 Moderate persistent asthma, uncomplicated: Secondary | ICD-10-CM | POA: Diagnosis not present

## 2023-10-20 NOTE — Progress Notes (Unsigned)
Synopsis: Referred in *** by [name] *  Assessment & Plan:   There are no diagnoses linked to this encounter.  Feels well, symptoms minimal, some days worse, especially when exposed to fumes at work.  PFT with drop in TLC, ERV (10%) and DLCO. Mild restriction, some of it obesity related. HRCT with mild GGO/fibrotic changes, potentially due to covid or pneumoconiosis due to exposure at work. He is not keen on getting a lung biopsy at this point. He is working on getting a new job. Repeat CT chest and PFT's at the one year mark.  Return in about 6 months (around 04/18/2024).  I spent *** minutes caring for this patient today, including {EM billing:[id number]}  Raechel Chute, MD [facility] 10/20/2023 2:13 PM    End of visit medications:  No orders of the defined types were placed in this encounter.    Current Outpatient Medications:    albuterol (VENTOLIN HFA) 108 (90 Base) MCG/ACT inhaler, Inhale 2 puffs into the lungs every 4 (four) hours as needed for wheezing or shortness of breath., Disp: 8.5 g, Rfl: 3   fluticasone-salmeterol (ADVAIR) 500-50 MCG/ACT AEPB, Inhale 1 puff into the lungs in the morning and at bedtime., Disp: 180 each, Rfl: 1   guaiFENesin-codeine (VIRTUSSIN A/C) 100-10 MG/5ML syrup, Take 5 mLs by mouth 3 (three) times daily as needed for cough., Disp: 120 mL, Rfl: 0   hydrochlorothiazide (HYDRODIURIL) 25 MG tablet, Take 1 tablet (25 mg total) by mouth daily., Disp: 90 tablet, Rfl: 1   loratadine (CLARITIN) 10 MG tablet, Take 10 mg by mouth as needed for allergies., Disp: , Rfl:    Naltrexone-buPROPion HCl ER (CONTRAVE) 8-90 MG TB12, Start 1 tablet every morning for 7 days, then 1 tablet twice daily for 7 days, then 2 tablets every morning and one every evening, Disp: , Rfl:    potassium chloride SA (KLOR-CON M) 20 MEQ tablet, TAKE 1 TABLET BY MOUTH ONCE DAILY, Disp: 90 tablet, Rfl: 1   doxycycline (VIBRA-TABS) 100 MG tablet, Take 1  tablet (100 mg total) by mouth 2 (two) times daily. For 10 days. Take with full glass of water, stay upright 30 min after taking. (Patient not taking: Reported on 09/17/2023), Disp: 20 tablet, Rfl: 0   omeprazole (PRILOSEC) 40 MG capsule, TAKE 1 CAPSULE BY MOUTH ONCE DAILY (Patient not taking: Reported on 09/17/2023), Disp: 90 capsule, Rfl: 0   predniSONE (DELTASONE) 20 MG tablet, Take daily with food. Start with 60mg  (3 pills) x 2 days, then reduce to 40mg  (2 pills) x 2 days, then 20mg  (1 pill) x 1 days. Total 5 day course. (Patient not taking: Reported on 09/17/2023), Disp: 11 tablet, Rfl: 0   Subjective:   PATIENT ID: Derek Cook GENDER: male DOB: 20-Sep-1960, MRN: [id number]  Chief Complaint  Patient presents with   Follow-up    HPI ***  Ancillary information including prior medications, full medical/surgical/family/social histories, and PFTs (when available) are listed below and have been reviewed.   ROS   Objective:   Vitals:   10/20/23 1357  BP: (!) 150/82  Pulse: 97  Temp: 97.6 F (36.4 C)  TempSrc: Temporal  SpO2: 96%  Weight: 271 lb (122.9 kg)  Height: 5\' 8"  (1.727 m)   96% on *** LPM *** RA BMI Readings from Last 3 Encounters:  10/20/23 41.21 kg/m  09/30/23 40.41 kg/m  09/17/23 40.45 kg/m   Wt Readings from Last 3 Encounters:  10/20/23 271 lb (122.9 kg)  09/30/23 265 lb 12.8 oz (120.6 kg)  09/17/23 266 lb (120.7 kg)    Physical Exam    Ancillary Information    Past Medical History:  Diagnosis Date   Allergy    Asthma    Colon polyp    GERD (gastroesophageal reflux disease)    Hyperlipidemia      Family History  Problem Relation Age of Onset   Cancer Mother        melanoma   Diabetes Mother    Hyperlipidemia Mother    Dementia Mother    Cancer Father    Cancer Sister    Heart disease Sister    Diabetes Brother    Hyperlipidemia Brother    Dementia Paternal Grandmother      Past Surgical History:  Procedure Laterality Date    CLAVICLE EXCISION     HERNIA REPAIR Bilateral 2004, 2005   Inguinal repair with mesh   WRIST ARTHROPLASTY      Social History   Socioeconomic History   Marital status: Married    Spouse name: Not on file   Number of children: Not on file   Years of education: Not on file   Highest education level: Not on file  Occupational History   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)  Tobacco Use   Smoking status: Former    Current packs/day: 0.00    Types: Cigarettes    Quit date: 09/07/2009    Years since quitting: 14.1   Smokeless tobacco: Former  Building services engineer status: Never Used  Substance and Sexual Activity   Alcohol use: No   Drug use: No   Sexual activity: Not on file  Other Topics Concern   Not on file  Social History Narrative   Not on file   Social Drivers of Health   Financial Resource Strain: Not on file  Food Insecurity: Not on file  Transportation Needs: Not on file  Physical Activity: Not on file  Stress: Not on file  Social Connections: Unknown (01/20/2022)   Received from [facility]   Social Network    Social Network: Not on file  Intimate Partner Violence: Unknown (12/12/2021)   Received from [facility], [facility]   HITS    Physically Hurt: Not on file    Insult or Talk Down To: Not on file    Threaten Physical Harm: Not on file    Scream or Curse: Not on file     Allergies  Allergen Reactions   Statins Other (See Comments)    myalgias   Azithromycin Hives   Shellfish-Derived Products    Sulfa Antibiotics      CBC    Component Value Date/Time   WBC 9.4 07/03/2022 1805   RBC 5.73 07/03/2022 1805   HGB 15.7 07/03/2022 1805   HGB 15.7 07/26/2014 1940   HCT 48.1 07/03/2022 1805   HCT 46.8 07/26/2014 1940   PLT 297 07/03/2022 1805   PLT 302 07/26/2014 1940   MCV 83.9 07/03/2022 1805   MCV 83 07/26/2014 1940   MCH 27.4 07/03/2022 1805   MCHC 32.6 07/03/2022 1805    RDW 13.7 07/03/2022 1805   RDW 14.0 07/26/2014 1940   LYMPHSABS 3.4 07/03/2022 1805   MONOABS 0.6 07/03/2022 1805   EOSABS 0.3 07/03/2022 1805   BASOSABS 0.1 07/03/2022 1805    Pulmonary Functions Testing Results:    Latest Ref Rng & Units 10/12/2023  11:35 AM  PFT Results  FVC-Pre L 2.86   FVC-Predicted Pre % 64   FVC-Post L 2.80   FVC-Predicted Post % 63   Pre FEV1/FVC % % 86   Post FEV1/FCV % % 90   FEV1-Pre L 2.47   FEV1-Predicted Pre % 73   FEV1-Post L 2.51   DLCO uncorrected ml/min/mmHg 19.54   DLCO UNC% % 75   DLVA Predicted % 107   TLC L 4.67   TLC % Predicted % 70   RV % Predicted % 81     Outpatient Medications Prior to Visit  Medication Sig Dispense Refill   albuterol (VENTOLIN HFA) 108 (90 Base) MCG/ACT inhaler Inhale 2 puffs into the lungs every 4 (four) hours as needed for wheezing or shortness of breath. 8.5 g 3   fluticasone-salmeterol (ADVAIR) 500-50 MCG/ACT AEPB Inhale 1 puff into the lungs in the morning and at bedtime. 180 each 1   guaiFENesin-codeine (VIRTUSSIN A/C) 100-10 MG/5ML syrup Take 5 mLs by mouth 3 (three) times daily as needed for cough. 120 mL 0   hydrochlorothiazide (HYDRODIURIL) 25 MG tablet Take 1 tablet (25 mg total) by mouth daily. 90 tablet 1   loratadine (CLARITIN) 10 MG tablet Take 10 mg by mouth as needed for allergies.     Naltrexone-buPROPion HCl ER (CONTRAVE) 8-90 MG TB12 Start 1 tablet every morning for 7 days, then 1 tablet twice daily for 7 days, then 2 tablets every morning and one every evening     potassium chloride SA (KLOR-CON M) 20 MEQ tablet TAKE 1 TABLET BY MOUTH ONCE DAILY 90 tablet 1   doxycycline (VIBRA-TABS) 100 MG tablet Take 1 tablet (100 mg total) by mouth 2 (two) times daily. For 10 days. Take with full glass of water, stay upright 30 min after taking. (Patient not taking: Reported on 09/17/2023) 20 tablet 0   omeprazole (PRILOSEC) 40 MG capsule TAKE 1 CAPSULE BY MOUTH ONCE DAILY (Patient not taking: Reported on  09/17/2023) 90 capsule 0   predniSONE (DELTASONE) 20 MG tablet Take daily with food. Start with 60mg  (3 pills) x 2 days, then reduce to 40mg  (2 pills) x 2 days, then 20mg  (1 pill) x 1 days. Total 5 day course. (Patient not taking: Reported on 09/17/2023) 11 tablet 0   No facility-administered medications prior to visit.

## 2023-10-28 ENCOUNTER — Ambulatory Visit: Payer: BC Managed Care – PPO | Admitting: Family Medicine

## 2023-10-28 ENCOUNTER — Encounter: Payer: Self-pay | Admitting: Family Medicine

## 2023-10-28 VITALS — BP 140/80 | HR 93 | Ht 68.0 in | Wt 273.0 lb

## 2023-10-28 DIAGNOSIS — I10 Essential (primary) hypertension: Secondary | ICD-10-CM | POA: Diagnosis not present

## 2023-10-28 DIAGNOSIS — J4540 Moderate persistent asthma, uncomplicated: Secondary | ICD-10-CM | POA: Diagnosis not present

## 2023-10-28 MED ORDER — CHLORTHALIDONE 25 MG PO TABS
25.0000 mg | ORAL_TABLET | Freq: Every day | ORAL | 0 refills | Status: DC
Start: 2023-10-28 — End: 2023-11-01

## 2023-10-28 NOTE — Patient Instructions (Addendum)
Thank you for coming to the office today.  BP improved, as we discussed. I agree with proceeding with the job pre employment evaluation.  Stop taking hydrochlorothiazide and switch to chlorthalidone 25mg  daily for better BP control.  Limited use in other BP meds that we could consider, so this may be best option to keep diuretic.  Keep on Potassium tablet daily.  You are medically cleared to proceed.  Okay to proceed with medical clearance now prior job employment testing.   Please schedule a Follow-up Appointment to: Return if symptoms worsen or fail to improve.  If you have any other questions or concerns, please feel free to call the office or send a message through MyChart. You may also schedule an earlier appointment if necessary.  Additionally, you may be receiving a survey about your experience at our office within a few days to 1 week by e-mail or mail. We value your feedback.  [name], DO [facility], [place]

## 2023-10-28 NOTE — Progress Notes (Signed)
Subjective:    Patient ID: Derek Cook, male    DOB: 1961-05-30, 63 y.o.   MRN: [id number]  Derek Cook is a 63 y.o. male presenting on 10/28/2023 for Hypertension and Asthma   HPI  Discussed the use of AI scribe software for clinical note transcription with the patient, who gave verbal consent to proceed.  History of Present Illness   Derek Cook is a 63 year old male with hypertension who presents with elevated blood pressure and concerns about physical exertion requirements for a potential job.  He has experienced elevated blood pressure readings, with a recent measurement of 170/100 mmHg, which he attributes to stress and lack of sleep over the past weekend while caring for his father-in-law who had a serious fall. He did not take his blood pressure medication, hydrochlorothiazide, during this period. Upon resuming his medication, his blood pressure improved to 142/78 mmHg.  He is here today about his ability to meet the physical demands of a potential job at [employer], which requires pulling a maximum force of 100 pounds. He feels physically able to do it but concern with his breathing, now much improved out of dust / chemical environment. Seems to believe accuracy of pulmonary evaluation recently that confirms his asthma is controlled but having occupational exposure and obesity causing dyspnea.  He also mentions consuming two cups of coffee, which he expected to elevate his blood pressure, but it remained stable.  Followed by Pulmonology now He has a history of asthma, but recent tests indicated that his breathing issues are not asthma-related. His breathing did not improve significantly after albuterol treatment, suggesting other factors such as weight and environmental dust may be contributing to his symptoms. He notes that he has been able to perform physically demanding tasks, such as scaling a dump truck, but requires time to catch his breath afterward.  He experiences  anxiety and stress at work, which he feels may contribute to his elevated blood pressure.          04/30/2023    1:18 PM 06/16/2022    9:55 AM 04/28/2022   10:43 AM  Depression screen PHQ 2/9  Decreased Interest 0 0 0  Down, Depressed, Hopeless 0 0 0  PHQ - 2 Score 0 0 0  Altered sleeping  0 0  Tired, decreased energy  0 0  Change in appetite  0 0  Feeling bad or failure about yourself   0 0  Trouble concentrating  0 0  Moving slowly or fidgety/restless  0 0  Suicidal thoughts  0 0  PHQ-9 Score  0 0  Difficult doing work/chores  Not difficult at all Not difficult at all       06/16/2022    9:55 AM 04/28/2022   10:43 AM 01/14/2021    1:19 PM  GAD 7 : Generalized Anxiety Score  Nervous, Anxious, on Edge 0 0 0  Control/stop worrying 0 0 0  Worry too much - different things 0 0 0  Trouble relaxing 0 0 0  Restless 0 0 0  Easily annoyed or irritable 0 0 0  Afraid - awful might happen 0 0 0  Total GAD 7 Score 0 0 0  Anxiety Difficulty Not difficult at all Not difficult at all Not difficult at all    Social History   Tobacco Use   Smoking status: Former    Current packs/day: 0.00    Types: Cigarettes    Quit date: 09/07/2009  Years since quitting: 14.1   Smokeless tobacco: Former  Building services engineer status: Never Used  Substance Use Topics   Alcohol use: No   Drug use: No    Review of Systems Per HPI unless specifically indicated above     Objective:    BP (!) 140/80 (BP Location: Left Arm, Cuff Size: Normal)   Pulse 93   Ht 5\' 8"  (1.727 m)   Wt 273 lb (123.8 kg)   SpO2 96%   BMI 41.51 kg/m   Wt Readings from Last 3 Encounters:  10/28/23 273 lb (123.8 kg)  10/20/23 271 lb (122.9 kg)  09/30/23 265 lb 12.8 oz (120.6 kg)    Physical Exam Vitals and nursing note reviewed.  Constitutional:      General: He is not in acute distress.    Appearance: He is well-developed. He is obese. He is not diaphoretic.     Comments: Well-appearing, comfortable,  cooperative  HENT:     Head: Normocephalic and atraumatic.  Eyes:     General:        Right eye: No discharge.        Left eye: No discharge.     Conjunctiva/sclera: Conjunctivae normal.  Neck:     Thyroid: No thyromegaly.  Cardiovascular:     Rate and Rhythm: Normal rate and regular rhythm.     Pulses: Normal pulses.     Heart sounds: Normal heart sounds. No murmur heard. Pulmonary:     Effort: Pulmonary effort is normal. No respiratory distress.     Breath sounds: Normal breath sounds. No wheezing or rales.     Comments: Improved Musculoskeletal:        General: Normal range of motion.     Cervical back: Normal range of motion and neck supple.     Right lower leg: No edema.     Left lower leg: No edema.  Lymphadenopathy:     Cervical: No cervical adenopathy.  Skin:    General: Skin is warm and dry.     Findings: No erythema or rash.  Neurological:     Mental Status: He is alert and oriented to person, place, and time. Mental status is at baseline.  Psychiatric:        Behavior: Behavior normal.     Comments: Well groomed, good eye contact, normal speech and thoughts     Results for orders placed or performed in visit on 10/12/23  Pulmonary Function Test [facility] Only   Collection Time: 10/12/23 11:35 AM  Result Value Ref Range   REPORT FILE NAME [id number]    FVC-Pre 2.86 L   FVC-%Pred-Pre 64 %   FVC-Post 2.80 L   FVC-%Pred-Post 63 %   FVC-%Change-Post -2 %   FEV1-Pre 2.47 L   FEV1-%Pred-Pre 73 %   FEV1-Post 2.51 L   FEV1-%Pred-Post 74 %   FEV1-%Change-Post 1 %   FEV6-Pre 2.86 L   FEV6-%Pred-Pre 67 %   FEV6-Post 2.80 L   FEV6-%Pred-Post 66 %   FEV6-%Change-Post -2 %   Pre FEV1/FVC ratio 86 %   FEV1FVC-%Pred-Pre 114 %   Post FEV1/FVC ratio 90 %   FEV1FVC-%Change-Post 4 %   Pre FEV6/FVC Ratio 100 %   FEV6FVC-%Pred-Pre 104 %   Post FEV6/FVC ratio 100 %   FEV6FVC-%Pred-Post 104 %   FEF 25-75 Pre 3.76 L/sec   FEF2575-%Pred-Pre 134 %   FEF  25-75 Post 3.93 L/sec   FEF2575-%Pred-Post 140 %   FEF2575-%Change-Post 4 %  RV 1.73 L   RV % pred 81 %   TLC 4.67 L   TLC % pred 70 %   DLCO unc 19.54 ml/min/mmHg   DLCO unc % pred 75 %   DL/VA 4.09 ml/min/mmHg/L   DL/VA % pred 811 %      Assessment & Plan:   Problem List Items Addressed This Visit     Hypertension - Primary   Relevant Medications   chlorthalidone (HYGROTON) 25 MG tablet   Moderate persistent asthma     Hypertension Recent Pre-Employment assessment, failed due to Elevated blood pressure readings (170/100) and elevated HR 100, he needs medical clearance today to proceed with pre-employment testing As noted, patient had specific stressors that triggered his elevated BP Past history - failed Amlodipine (swelling), Lisinopril (cough), prefer to avoid Beta blocker w/ asthma -Switch from hydrochlorothiazide 25mg  to  Chlorthalidone 25mg  for 30 days trial to see if better efficacy on BP control and diuretic -Continue Potassium supplementation. -Monitor blood pressure at home and report readings. Notify office if persistently >140/90 and if/when ready to pursue further refills Chlorthalidone 25mg  or if we need higher dose or switch back to hydrochlorothiazide  Moderate persistent asthma / Shortness of Breath Followed by [facility] Pulmonology now. Recent evaluation did not demonstrate significant symptoms due to asthma. Seems mostly now related to occupational exposure dust chemical triggers and obesity as well -Continue avoidance of occupational dust exposure. -Consider weight loss for overall health improvement. Continue current inhaler management  Employment Physical Patient requires clearance for a job that involves several physical requirements including pushing a maximum force of 100 pounds. -Signed off on employment physical form. Completed form returned to patient today. He is medically cleared to participate. -Plan to monitor response to change in  antihypertensive medication.  Follow-up -Keep in touch with updates on blood pressure readings. -If Chlorthalidone is well-tolerated and effective, consider switching to a 90-day supply.         No orders of the defined types were placed in this encounter.   Meds ordered this encounter  Medications   chlorthalidone (HYGROTON) 25 MG tablet    Sig: Take 1 tablet (25 mg total) by mouth daily.    Dispense:  30 tablet    Refill:  0    Stop hydrochlorothiazide and start Chlorthalidone    Follow up plan: Return if symptoms worsen or fail to improve.  [name], DO [facility] 10/28/2023, 11:57 AM

## 2023-11-01 ENCOUNTER — Telehealth: Payer: Self-pay

## 2023-11-01 DIAGNOSIS — I10 Essential (primary) hypertension: Secondary | ICD-10-CM

## 2023-11-01 MED ORDER — HYDROCHLOROTHIAZIDE 25 MG PO TABS: 25.0000 mg | ORAL_TABLET | Freq: Every day | ORAL | 3 refills | Status: AC

## 2023-11-01 NOTE — Telephone Encounter (Signed)
 Copied from CRM [id number]. Topic: General - Call Back - No Documentation >> Nov 01, 2023  9:57 AM Higinio Roger wrote: Reason for CRM: Patient is requesting a callback to let Dr. Ashley Mariner they were unable to take new blood pressure medication (chlorthalidone (HYGROTON) 25 MG tablet). Patient states this medication caused their head to swim and legs were weak. Patient is also requesting refills of of previous blood pressure meds. Callback #: [phone]

## 2023-11-01 NOTE — Telephone Encounter (Signed)
Patient notified verbal understanding

## 2023-11-01 NOTE — Telephone Encounter (Signed)
 Please notify patient  Understood. We can switch back to previous BP med. Rx changed / new rx hydrochlorothiazide 25mg  sent to [place].  [name], DO [facility] 11/01/2023, 11:35 AM

## 2023-12-07 ENCOUNTER — Other Ambulatory Visit: Payer: Self-pay | Admitting: Family Medicine

## 2023-12-07 DIAGNOSIS — J4530 Mild persistent asthma, uncomplicated: Secondary | ICD-10-CM

## 2023-12-09 NOTE — Telephone Encounter (Signed)
 Requested Prescriptions  Pending Prescriptions Disp Refills   albuterol (VENTOLIN HFA) 108 (90 Base) MCG/ACT inhaler [Pharmacy Med Name: ALBUTEROL SULFATE HFA 108 (90 BASE)] 8.5 g 1    Sig: INHALE 2 PUFFS INTO THE LUNGA EVERY 4 HOURS AS NEEDED FOR WHEEZING OR SHORTNESS OF BREATH     Pulmonology:  Beta Agonists 2 Failed - 12/09/2023  9:21 AM      Failed - Last BP in normal range    BP Readings from Last 1 Encounters:  10/28/23 (!) 140/80         Failed - Valid encounter within last 12 months    Recent Outpatient Visits           1 month ago Primary hypertension   [place] [facility] [place], Netta Neat, DO              Passed - Last Heart Rate in normal range    Pulse Readings from Last 1 Encounters:  10/28/23 93

## 2023-12-22 ENCOUNTER — Other Ambulatory Visit: Payer: Self-pay | Admitting: Family Medicine

## 2023-12-22 DIAGNOSIS — K219 Gastro-esophageal reflux disease without esophagitis: Secondary | ICD-10-CM

## 2023-12-23 NOTE — Telephone Encounter (Signed)
 Requested Prescriptions  Pending Prescriptions Disp Refills   omeprazole (PRILOSEC) 40 MG capsule [Pharmacy Med Name: OMEPRAZOLE DR 40 MG CAP] 90 capsule 0    Sig: TAKE 1 CAPSULE BY MOUTH ONCE DAILY     Gastroenterology: Proton Pump Inhibitors Failed - 12/23/2023  1:51 PM      Failed - Valid encounter within last 12 months    Recent Outpatient Visits           1 month ago Primary hypertension   [place] [facility] [place], Kayleen Party, [place]

## 2024-01-08 ENCOUNTER — Other Ambulatory Visit: Payer: Self-pay | Admitting: Family Medicine

## 2024-01-08 DIAGNOSIS — I10 Essential (primary) hypertension: Secondary | ICD-10-CM

## 2024-01-11 NOTE — Telephone Encounter (Signed)
 Requested medication (s) are due for refill today - yes  Requested medication (s) are on the active medication list -yes  Future visit scheduled -no  Last refill: 07/19/23 #90 1RF  Notes to clinic: fails lab protocol- over 1 year-07/03/22  Requested Prescriptions  Pending Prescriptions Disp Refills   KLOR-CON  M20 20 MEQ tablet [Pharmacy Med Name: KLOR-CON  M20 20 MEQ TAB] 90 tablet 1    Sig: TAKE 1 TABLET BY MOUTH ONCE DAILY     Endocrinology:  Minerals - Potassium Supplementation Failed - 01/11/2024 11:35 AM      Failed - K in normal range and within 360 days    Potassium  Date Value Ref Range Status  07/03/2022 4.0 3.5 - 5.1 mmol/L Final  07/26/2014 4.1 3.5 - 5.1 mmol/L Final         Failed - Cr in normal range and within 360 days    Creat  Date Value Ref Range Status  04/28/2022 1.17 0.70 - 1.30 mg/dL Final   Creatinine, Ser  Date Value Ref Range Status  07/03/2022 1.10 0.61 - 1.24 mg/dL Final         Failed - Valid encounter within last 12 months    Recent Outpatient Visits           2 months ago Primary hypertension   [place] [facility] Raina Bunting, DO                 Requested Prescriptions  Pending Prescriptions Disp Refills   KLOR-CON  M20 20 MEQ tablet [Pharmacy Med Name: KLOR-CON  M20 20 MEQ TAB] 90 tablet 1    Sig: TAKE 1 TABLET BY MOUTH ONCE DAILY     Endocrinology:  Minerals - Potassium Supplementation Failed - 01/11/2024 11:35 AM      Failed - K in normal range and within 360 days    Potassium  Date Value Ref Range Status  07/03/2022 4.0 3.5 - 5.1 mmol/L Final  07/26/2014 4.1 3.5 - 5.1 mmol/L Final         Failed - Cr in normal range and within 360 days    Creat  Date Value Ref Range Status  04/28/2022 1.17 0.70 - 1.30 mg/dL Final   Creatinine, Ser  Date Value Ref Range Status  07/03/2022 1.10 0.61 - 1.24 mg/dL Final         Failed - Valid encounter within last 12 months    Recent Outpatient Visits            2 months ago Primary hypertension   [place] [facility] [place], Kayleen Party, DO

## 2024-01-28 ENCOUNTER — Encounter: Payer: Self-pay | Admitting: Internal Medicine

## 2024-01-28 ENCOUNTER — Ambulatory Visit: Payer: Self-pay

## 2024-01-28 ENCOUNTER — Ambulatory Visit (INDEPENDENT_AMBULATORY_CARE_PROVIDER_SITE_OTHER): Payer: Self-pay | Admitting: Internal Medicine

## 2024-01-28 VITALS — BP 132/84 | Ht 68.0 in | Wt 262.0 lb

## 2024-01-28 DIAGNOSIS — T63481A Toxic effect of venom of other arthropod, accidental (unintentional), initial encounter: Secondary | ICD-10-CM | POA: Diagnosis not present

## 2024-01-28 DIAGNOSIS — S30861A Insect bite (nonvenomous) of abdominal wall, initial encounter: Secondary | ICD-10-CM | POA: Diagnosis not present

## 2024-01-28 NOTE — Patient Instructions (Signed)
Insect Bite, Adult An insect bite can make your skin red, itchy, and swollen. Some insects can spread disease to people with a bite. However, most insect bites do not lead to disease, and most are not serious. What are the causes? Insects may bite for many reasons, including: Hunger. To defend themselves. Insects that bite include: Spiders. Mosquitoes. Flies. Ticks and fleas. Ants. Kissing bugs. Chiggers. What are the signs or symptoms? Symptoms often last for 2-4 days. However, itching can last up to 10 days. Symptoms include: Itching or pain in the bite area. Redness and swelling in the bite area. An open wound. In rare cases, a person may have a very bad allergic reaction (anaphylactic reaction) to a bite. Symptoms of an anaphylactic reaction may include: Feeling warm in the face (flushed). Your face may turn red. Itchy, red, swollen areas of skin (hives). Swelling of the eyes, lips, face, mouth, tongue, or throat. Trouble with breathing, talking, or swallowing. High-pitched whistling sounds, most often when breathing out (wheezing). Feeling dizzy or light-headed. Fainting. Pain or cramps in your belly (abdomen). Vomiting. Watery poop (diarrhea). How is this treated? Most insect bites are not serious. Symptoms often go away on their own. When treatment is advised, it may include: Putting ice on the bite area. Putting a cream or lotion, like calamine lotion, on the bite area. This helps with itching. Using medicines called antihistamines. You may also need: A tetanus shot if you are not up to date. An antibiotic cream or medicine. This treatment is needed if the bite area gets infected. Follow these instructions at home: Bite area care  Do not scratch the bite area. It may help to cover the bite area with a bandage or close-fitting clothing. Keep the bite area clean and dry. Check the bite area every day for signs of infection. Check for: More redness, swelling, or  pain. Fluid or blood. Warmth. Pus or a bad smell. Wash your hands often. Managing pain, itching, and swelling  You may put any of these on the bite area as told by your doctor: A paste made of baking soda and water. Cortisone cream. Calamine lotion. If told, put ice on the bite area. To do this: Put ice in a plastic bag. Place a towel between your skin and the bag. Leave the ice on for 20 minutes, 2-3 times a day. If your skin turns bright red, take off the ice right away to prevent skin damage. The risk of skin damage is higher if you cannot feel pain, heat, or cold. General instructions Apply or take over-the-counter and prescription medicines only as told by your doctor. If you were prescribed antibiotics, take or apply them as told by your doctor. Do not stop using them even if you start to feel better. How is this prevented? To help you have a lower risk of insect bites: When you are outside, wear clothes that cover your arms and legs. Use insect repellent. The best insect repellents contain one of these: DEET. Picaridin. Oil of lemon eucalyptus (OLE). IR3535. Consider spraying your clothing with a pesticide called permethrin. Permethrin helps prevent insect bites. It works for several weeks and for up to 5-6 clothing washes. Do not apply permethrin directly to the skin. If your home windows do not have screens, think about putting some in. If you will be sleeping in an area where there are mosquitoes, consider covering your sleeping area with a mosquito net. Contact a doctor if: You have redness, swelling, or pain   in the bite area. You have fluid or blood coming from the bite area. The bite area feels warm to the touch. You have pus or a bad smell coming from the bite area. You have a fever. Get help right away if: You have joint pain. You have a rash. You feel weak or more tired than you normally do. You have neck pain or a headache. You have signs of an anaphylactic  reaction. Signs may include: Swelling of your eyes, lips, face, mouth, tongue, or throat. Feeling warm in the face. Itchy, red, swollen areas of skin. Trouble with breathing, talking, or swallowing. Wheezing. Feeling dizzy or light-headed. Fainting. Pain or cramps in your belly. Vomiting or watery poop. These symptoms may be an emergency. Get help right away. Call 911. Do not wait to see if symptoms will go away. Do not drive yourself to the hospital. Summary An insect bite can make your skin red, itchy, and swollen. Treatment is usually not needed. Symptoms often go away on their own. Do not scratch the bite area. Keep it clean and dry. Use insect repellent to help prevent insect bites. Contact a doctor if you have signs of infection. This information is not intended to replace advice given to you by your health care provider. Make sure you discuss any questions you have with your health care provider. Document Revised: 11/18/2021 Document Reviewed: 11/18/2021 Elsevier Patient Education  2024 [employer]  

## 2024-01-28 NOTE — Progress Notes (Signed)
 Subjective:    Patient ID: Derek Cook, male    DOB: Feb 04, 1961, 63 y.o.   MRN: [id number]  HPI  Discussed the use of AI scribe software for clinical note transcription with the patient, who gave verbal consent to proceed.  Derek Cook is a 63 year old male who presents with a suspected tick or insect bite on the abdominal wall.  Approximately one week ago, he noticed a bite on his abdominal wall. Initially, the area had a brown spot with a white ring around it, which later became bright red. He applied an antibacterial cream to the area a couple of times, which helped fade the redness.  He did not see a tick on his body but suspects it might be a tick bite due to his recent activities in areas where ticks are prevalent. He experiences itching at the site of the bite but has not observed any significant pus drainage. The bite was initially larger but has not shown signs of infection.  No systemic symptoms such as fever or chills. He is currently using an antibacterial cream.       Review of Systems   Past Medical History:  Diagnosis Date   Allergy    Asthma    Colon polyp    GERD (gastroesophageal reflux disease)    Hyperlipidemia     Current Outpatient Medications  Medication Sig Dispense Refill   albuterol  (VENTOLIN  HFA) 108 (90 Base) MCG/ACT inhaler INHALE 2 PUFFS INTO THE LUNGA EVERY 4 HOURS AS NEEDED FOR WHEEZING OR SHORTNESS OF BREATH 8.5 g 1   fluticasone -salmeterol (ADVAIR ) 500-50 MCG/ACT AEPB Inhale 1 puff into the lungs in the morning and at bedtime. 180 each 1   hydrochlorothiazide  (HYDRODIURIL ) 25 MG tablet Take 1 tablet (25 mg total) by mouth daily. 90 tablet 3   KLOR-CON  M20 20 MEQ tablet TAKE 1 TABLET BY MOUTH ONCE DAILY 90 tablet 1   loratadine (CLARITIN) 10 MG tablet Take 10 mg by mouth as needed for allergies.     Naltrexone-buPROPion HCl ER (CONTRAVE ) 8-90 MG TB12 Start 1 tablet every morning for 7 days, then 1 tablet twice daily for 7 days, then 2  tablets every morning and one every evening (Patient not taking: Reported on 10/28/2023)     omeprazole  (PRILOSEC) 40 MG capsule TAKE 1 CAPSULE BY MOUTH ONCE DAILY 90 capsule 1   No current facility-administered medications for this visit.    Allergies  Allergen Reactions   Statins Other (See Comments)    myalgias   Azithromycin  Hives   Shellfish-Derived Products    Sulfa Antibiotics     Family History  Problem Relation Age of Onset   Cancer Mother        melanoma   Diabetes Mother    Hyperlipidemia Mother    Dementia Mother    Cancer Father    Cancer Sister    Heart disease Sister    Diabetes Brother    Hyperlipidemia Brother    Dementia Paternal Grandmother     Social History   Socioeconomic History   Marital status: Married    Spouse name: Not on file   Number of children: Not on file   Years of education: Not on file   Highest education level: Not on file  Occupational History   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)  Tobacco Use   Smoking status: Former    Current packs/day: 0.00  Types: Cigarettes    Quit date: 09/07/2009    Years since quitting: 14.4   Smokeless tobacco: Former  Building services engineer status: Never Used  Substance and Sexual Activity   Alcohol use: No   Drug use: No   Sexual activity: Not on file  Other Topics Concern   Not on file  Social History Narrative   Not on file   Social Drivers of Health   Financial Resource Strain: Not on file  Food Insecurity: Not on file  Transportation Needs: Not on file  Physical Activity: Not on file  Stress: Not on file  Social Connections: Unknown (01/20/2022)   [facility]   Social Network    Social Network: Not on file  Intimate Partner Violence: Unknown (12/12/2021)   Received from [facility], [facility]   HITS    Physically Hurt: Not on file    Insult or Talk Down To: Not on file    Threaten Physical  Harm: Not on file    Scream or Curse: Not on file     Constitutional: Denies fever, malaise, fatigue, headache or abrupt weight changes.  Respiratory: Denies difficulty breathing, shortness of breath, cough or sputum production.   Cardiovascular: Denies chest pain, chest tightness, palpitations or swelling in the hands or feet.  Skin: Pt reports insect bite of abdominal wall. Denies redness, rashes,  or ulcerations.  Neurological: Denies dizziness, difficulty with memory, difficulty with speech or problems with balance and coordination.   No other specific complaints in a complete review of systems (except as listed in HPI above).      Objective:   Physical Exam BP 132/84 (BP Location: Left Arm, Patient Position: Sitting, Cuff Size: Large)   Ht 5\' 8"  (1.727 m)   Wt 262 lb (118.8 kg)   BMI 39.84 kg/m   Wt Readings from Last 3 Encounters:  10/28/23 273 lb (123.8 kg)  10/20/23 271 lb (122.9 kg)  09/30/23 265 lb 12.8 oz (120.6 kg)    General: Appears his stated age, obese, in NAD. Skin: 1.5 cm oval shaped raised papular lesion with central indention noted of right abdominal wall.  Cardiovascular: Normal rate . Pulmonary/Chest: Normal effort and positive vesicular breath sounds. Musculoskeletal: No difficulty with gait.  Neurological: Alert and oriented.    BMET    Component Value Date/Time   NA 137 07/03/2022 1805   NA 138 07/26/2014 1940   K 4.0 07/03/2022 1805   K 4.1 07/26/2014 1940   CL 104 07/03/2022 1805   CL 105 07/26/2014 1940   CO2 25 07/03/2022 1805   CO2 26 07/26/2014 1940   GLUCOSE 132 (H) 07/03/2022 1805   GLUCOSE 111 (H) 07/26/2014 1940   BUN 15 07/03/2022 1805   BUN 13 07/26/2014 1940   CREATININE 1.10 07/03/2022 1805   CREATININE 1.17 04/28/2022 1147   CALCIUM  10.1 07/03/2022 1805   CALCIUM  9.4 07/26/2014 1940   GFRNONAA >60 07/03/2022 1805   GFRNONAA 83 01/07/2021 0830   GFRAA 96 01/07/2021 0830    Lipid Panel     Component Value Date/Time    CHOL 330 (H) 04/28/2022 1147   TRIG 623 (H) 04/28/2022 1147   HDL 46 04/28/2022 1147   CHOLHDL 7.2 (H) 04/28/2022 1147   VLDL NOT CALC 06/25/2016 1440   LDLCALC  04/28/2022 1147     Comment:     . LDL cholesterol not calculated. Triglyceride levels greater than 400 mg/dL invalidate calculated LDL results. Aaron Aas  Reference range: <100 . Desirable range <100 mg/dL for primary prevention;   <70 mg/dL for patients with CHD or diabetic patients  with > or = 2 CHD risk factors. Aaron Aas LDL-C is now calculated using the Martin-Hopkins  calculation, which is a validated novel method providing  better accuracy than the Friedewald equation in the  estimation of LDL-C.  Melinda Sprawls et al. Erroll Heard. 1610;960(45): 2061-2068  (http://education.[employer].com/faq/FAQ164)     CBC    Component Value Date/Time   WBC 9.4 07/03/2022 1805   RBC 5.73 07/03/2022 1805   HGB 15.7 07/03/2022 1805   HGB 15.7 07/26/2014 1940   HCT 48.1 07/03/2022 1805   HCT 46.8 07/26/2014 1940   PLT 297 07/03/2022 1805   PLT 302 07/26/2014 1940   MCV 83.9 07/03/2022 1805   MCV 83 07/26/2014 1940   MCH 27.4 07/03/2022 1805   MCHC 32.6 07/03/2022 1805   RDW 13.7 07/03/2022 1805   RDW 14.0 07/26/2014 1940   LYMPHSABS 3.4 07/03/2022 1805   MONOABS 0.6 07/03/2022 1805   EOSABS 0.3 07/03/2022 1805   BASOSABS 0.1 07/03/2022 1805    Hgb A1C Lab Results  Component Value Date   HGBA1C 5.8 (H) 04/28/2022            Assessment & Plan:   Assessment and Plan    Local reaction to insect bite Local reaction on abdominal wall, likely from insect or tick bite. No infection or erythema migrans signs. Expected resolution in 2-6 weeks. Doxycycline  not indicated. - Apply antibiotic and hydrocortisone cream twice daily until improvement. -Recommend use of DEET if working in areas prevalent to have multiple insects or ticks -Update for new or worsening symptoms    Followup with your PCP as previously scheduled

## 2024-01-28 NOTE — Telephone Encounter (Signed)
 Spoke with patient, offered appointment for next week. Advised UC if he wants to be seen sooner. Explained we can not call anything in with out an appointment

## 2024-01-28 NOTE — Telephone Encounter (Signed)
  Chief Complaint: insect bite Symptoms: area of redness the size of a quarter to right side of abdomen Frequency: over a week Pertinent Negatives: Patient denies fever Disposition: [] ED /[] Urgent Care (no appt availability in office) / [] Appointment(In office/virtual)/ []  [facility]/ [] Home Care/ [] Refused Recommended Disposition /[]  Mobile Bus/ [x]  Follow-up with PCP Additional Notes: patient calling with concerns for possible insect bite to the right side of abdomen. Patient states the bite happened over a week ago. Patient is unsure what type of insect it was. Patient states the area to his abdomen is the size of a quarter and red. Denies fever. Patient is asking for medication to be sent to his pharmacy. Patient states he is unable to be seen in office due to availability with his job. Patient is asking for follow up call from office.    Copied from CRM [id number]. Topic: Clinical - Red Word Triage >> Jan 28, 2024 10:21 AM Oddis Bench wrote: Red Word that prompted transfer to Nurse Triage: Patient is calling about a bite on his right side last week and it is still red and is itching. Reason for Disposition  [1] Red or very tender (to touch) area AND [2] started over 24 hours after the bite  Answer Assessment - Initial Assessment Questions 1. TYPE of INSECT: "What type of insect was it?"      Unsure of what bite him 2. ONSET: "When did you get bitten?"      About a week ago 3. LOCATION: "Where is the insect bite located?"      Right side of abdomen 4. REDNESS: "Is the area red or pink?" If Yes, ask: "What size is area of redness?" (inches or cm). "When did the redness start?"     Red-slightly bigger than a quarter 5. PAIN: "Is there any pain?" If Yes, ask: "How bad is it?"  (Scale 1-10; or mild, moderate, severe)     no 6. ITCHING: "Does it itch?" If Yes, ask: "How bad is the itch?"    - MILD: doesn't interfere with normal activities   - MODERATE-SEVERE: interferes  with work, school, sleep, or other activities      Mild 7. SWELLING: "How big is the swelling?" (inches, cm, or compare to coins)     no 8. OTHER SYMPTOMS: "Do you have any other symptoms?"  (e.g., difficulty breathing, hives)     no  Protocols used: Insect Bite-A-AH

## 2024-02-22 ENCOUNTER — Ambulatory Visit: Payer: Self-pay

## 2024-02-22 ENCOUNTER — Other Ambulatory Visit: Payer: Self-pay | Admitting: Family Medicine

## 2024-02-22 ENCOUNTER — Encounter: Payer: Self-pay | Admitting: Emergency Medicine

## 2024-02-22 ENCOUNTER — Ambulatory Visit
Admission: EM | Admit: 2024-02-22 | Discharge: 2024-02-22 | Disposition: A | Discharge status: Home / Self Care | Payer: Self-pay | Attending: Physician Assistant | Admitting: Physician Assistant

## 2024-02-22 DIAGNOSIS — R051 Acute cough: Secondary | ICD-10-CM | POA: Insufficient documentation

## 2024-02-22 DIAGNOSIS — B349 Viral infection, unspecified: Secondary | ICD-10-CM | POA: Insufficient documentation

## 2024-02-22 DIAGNOSIS — J4530 Mild persistent asthma, uncomplicated: Secondary | ICD-10-CM

## 2024-02-22 DIAGNOSIS — J45909 Unspecified asthma, uncomplicated: Secondary | ICD-10-CM | POA: Insufficient documentation

## 2024-02-22 DIAGNOSIS — R062 Wheezing: Secondary | ICD-10-CM | POA: Insufficient documentation

## 2024-02-22 LAB — SARS CORONAVIRUS 2 BY RT PCR: SARS Coronavirus 2 by RT PCR: NEGATIVE

## 2024-02-22 MED ORDER — PROMETHAZINE-DM 6.25-15 MG/5ML PO SYRP: 5.0000 mL | ORAL_SOLUTION | Freq: Four times a day (QID) | ORAL | 0 refills | Status: DC | PRN

## 2024-02-22 MED ORDER — PREDNISONE 20 MG PO TABS: 40.0000 mg | ORAL_TABLET | Freq: Every day | ORAL | 0 refills | Status: AC

## 2024-02-22 NOTE — Telephone Encounter (Signed)
  FYI Only or Action Required?: FYI only for provider  Patient was last seen in primary care on 01/28/2024 by Carollynn Cirri, NP. Called Nurse Triage reporting Cough, Fatigue, and Wheezing. Symptoms began several days ago. Interventions attempted: OTC medications: Claritin, tylenol , motrin and Prescription medications: Advair  inhaler, Albuterol  inhaler, Flonase . Symptoms are: productive cough, wheezing, body aches, chills, runny nose, fatigue gradually worsening.  Triage Disposition: See HCP Within 4 Hours (Or PCP Triage)- No available appointments, patient going to urgent care  Patient/caregiver understands and will follow disposition?: Yes                        Summary: worsening symptoms    coughing, wheezing and bad fatigue         Reason for Disposition  Wheezing is present  Answer Assessment - Initial Assessment Questions 1. ONSET: When did the cough begin?      Thursday/Friday last week.  2. SEVERITY: How bad is the cough today?      He states sometimes the cough causes him to get dizzy after spells. He states it is not constant.  3. SPUTUM: Describe the color of your sputum (none, dry cough; clear, white, yellow, green)     White.  4. HEMOPTYSIS: Are you coughing up any blood? If so ask: How much? (flecks, streaks, tablespoons, etc.)     No.  5. DIFFICULTY BREATHING: Are you having difficulty breathing? If Yes, ask: How bad is it? (e.g., mild, moderate, severe)    - MILD: No SOB at rest, mild SOB with walking, speaks normally in sentences, can lie down, no retractions, pulse < 100.    - MODERATE: SOB at rest, SOB with minimal exertion and prefers to sit, cannot lie down flat, speaks in phrases, mild retractions, audible wheezing, pulse 100-120.    - SEVERE: Very SOB at rest, speaks in single words, struggling to breathe, sitting hunched forward, retractions, pulse > 120      He states he has had wheezing when he lies down, no audible  wheezing while speaking. No labored breathing or respiratory distress noted. He states his pulse oximeter is reading 93%.   6. FEVER: Do you have a fever? If Yes, ask: What is your temperature, how was it measured, and when did it start?     No.  7. CARDIAC HISTORY: Do you have any history of heart disease? (e.g., heart attack, congestive heart failure)      HTN.  8. LUNG HISTORY: Do you have any history of lung disease?  (e.g., pulmonary embolus, asthma, emphysema)     Asthma.  9. PE RISK FACTORS: Do you have a history of blood clots? (or: recent major surgery, recent prolonged travel, bedridden)     No.  10. OTHER SYMPTOMS: Do you have any other symptoms? (e.g., runny nose, wheezing, chest pain)       Runny nose, chills, body aches, fatigue.  11. PREGNANCY: Is there any chance you are pregnant? When was your last menstrual period?       N/A.  12. TRAVEL: Have you traveled out of the country in the last month? (e.g., travel history, exposures)       No.  Called [facility] to confirm no availability for [facility], no answer.  Protocols used: Cough - Acute Productive-A-AH

## 2024-02-22 NOTE — ED Provider Notes (Signed)
 [facility]    CSN: [id number] Arrival date & time: 02/22/24  1022      History   Chief Complaint Chief Complaint  Patient presents with   Cough   Fatigue   Generalized Body Aches    HPI Derek Cook is a 63 y.o. male presenting for 4-day history of chills, body aches, runny nose, cough, congestion, and fatigue.  Admits to feeling very feverish with chills and sweats, but has had a normal temp each time he has checked it. Mild wheezing but no shortness of breath.  He denies any other symptoms.  He denies sore throat, headaches, neck pain, chest pain, breathing difficulty, ear pain, sinus pain, abdominal pain. Patient has a medical history significant for asthma, allergies, acid reflux, hypertension and prediabetes.  Wife has been ill with bronchitis for a month. No other complaints or concerns today.  HPI  Past Medical History:  Diagnosis Date   Allergy    Asthma    Colon polyp    GERD (gastroesophageal reflux disease)    Hyperlipidemia     Patient Active Problem List   Diagnosis Date Noted   Morbid obesity (HCC) 01/14/2021   Elevated liver transaminase level 07/11/2020   Hyponatremia 07/11/2020   SOB (shortness of breath) 06/25/2020   Myalgia due to statin 12/03/2017   Chronic low back pain 06/21/2017   Multiple nevi 06/21/2017   Familial hypertriglyceridemia 04/06/2017   Plantar fasciitis of right foot 04/05/2017   Allergic rhinitis due to allergen 09/17/2016   Dry skin dermatitis 09/09/2016   Carpal tunnel syndrome, left 06/25/2016   GERD (gastroesophageal reflux disease) 05/29/2016   Prediabetes 03/24/2016   Moderate persistent asthma 02/13/2016   Hyperlipidemia 02/13/2016   Hypertension 02/13/2016    Past Surgical History:  Procedure Laterality Date   CLAVICLE EXCISION     HERNIA REPAIR Bilateral 2004, 2005   Inguinal repair with mesh   WRIST ARTHROPLASTY         Home Medications    Prior to Admission medications   Medication Sig  Start Date End Date Taking? Authorizing Provider  predniSONE  (DELTASONE ) 20 MG tablet Take 2 tablets (40 mg total) by mouth daily for 5 days. 02/22/24 02/27/24 Yes Nancy Axon B, PA-C  promethazine -dextromethorphan  (PROMETHAZINE -DM) 6.25-15 MG/5ML syrup Take 5 mLs by mouth 4 (four) times daily as needed. 02/22/24  Yes Nancy Axon B, PA-C  albuterol  (VENTOLIN  HFA) 108 (90 Base) MCG/ACT inhaler INHALE 2 PUFFS INTO THE LUNGA EVERY 4 HOURS AS NEEDED FOR WHEEZING OR SHORTNESS OF BREATH 12/09/23   Karamalegos, Kayleen Party, DO  fluticasone -salmeterol (ADVAIR ) 500-50 MCG/ACT AEPB Inhale 1 puff into the lungs in the morning and at bedtime. 08/25/23   Karamalegos, Kayleen Party, DO  hydrochlorothiazide  (HYDRODIURIL ) 25 MG tablet Take 1 tablet (25 mg total) by mouth daily. 11/01/23   Karamalegos, Kayleen Party, DO  KLOR-CON  M20 20 MEQ tablet TAKE 1 TABLET BY MOUTH ONCE DAILY 01/11/24   Romeo Co, Kayleen Party, DO  loratadine (CLARITIN) 10 MG tablet Take 10 mg by mouth as needed for allergies.    [provider]  Naltrexone-buPROPion HCl ER (CONTRAVE ) 8-90 MG TB12 Start 1 tablet every morning for 7 days, then 1 tablet twice daily for 7 days, then 2 tablets every morning and one every evening Patient not taking: Reported on 01/28/2024 08/25/23   Raina Bunting, DO  omeprazole  (PRILOSEC) 40 MG capsule TAKE 1 CAPSULE BY MOUTH ONCE DAILY 12/23/23   Romeo Co Kayleen Party, DO    Family  History Family History  Problem Relation Age of Onset   Cancer Mother        melanoma   Diabetes Mother    Hyperlipidemia Mother    Dementia Mother    Cancer Father    Cancer Sister    Heart disease Sister    Diabetes Brother    Hyperlipidemia Brother    Dementia Paternal Grandmother     Social History Social History   Tobacco Use   Smoking status: Former    Current packs/day: 0.00    Types: Cigarettes    Quit date: 09/07/2009    Years since quitting: 14.4   Smokeless tobacco: Former  Haematologist status: Never Used  Substance Use Topics   Alcohol use: No   Drug use: No     Allergies   Statins, Azithromycin , Shellfish-derived products, and Sulfa antibiotics   Review of Systems Review of Systems  Constitutional:  Positive for chills, diaphoresis and fatigue. Negative for fever.  HENT:  Positive for congestion and rhinorrhea. Negative for sinus pressure, sinus pain and sore throat.   Respiratory:  Positive for cough and wheezing. Negative for shortness of breath.   Cardiovascular:  Negative for chest pain.  Gastrointestinal:  Negative for abdominal pain, diarrhea, nausea and vomiting.  Musculoskeletal:  Positive for myalgias.  Neurological:  Negative for weakness, light-headedness and headaches.  Hematological:  Negative for adenopathy.     Physical Exam Triage Vital Signs  No data found.  Updated Vital Signs BP 122/88 (BP Location: Right Arm)   Pulse 98   Temp 98.3 F (36.8 C) (Oral)   Resp 18   SpO2 95%       Physical Exam Vitals and nursing note reviewed.  Constitutional:      General: He is not in acute distress.    Appearance: Normal appearance. He is well-developed. He is not ill-appearing, toxic-appearing or diaphoretic.  HENT:     Head: Normocephalic and atraumatic.     Nose: Congestion and rhinorrhea (clear) present.     Mouth/Throat:     Mouth: Mucous membranes are moist.     Pharynx: Uvula midline. No posterior oropharyngeal erythema.     Tonsils: No tonsillar abscesses.   Eyes:     General: No scleral icterus.       Right eye: No discharge.        Left eye: No discharge.     Conjunctiva/sclera: Conjunctivae normal.   Neck:     Thyroid: No thyromegaly.     Trachea: No tracheal deviation.   Cardiovascular:     Rate and Rhythm: Regular rhythm. Tachycardia present.     Heart sounds: Normal heart sounds.  Pulmonary:     Effort: Pulmonary effort is normal. No respiratory distress.     Breath sounds: Normal breath sounds. No wheezing  or rales.   Musculoskeletal:     Cervical back: Normal range of motion and neck supple.  Lymphadenopathy:     Cervical: No cervical adenopathy.   Skin:    General: Skin is warm and dry.     Findings: No rash.   Neurological:     General: No focal deficit present.     Mental Status: He is alert. Mental status is at baseline.     Gait: Gait normal.   Psychiatric:        Mood and Affect: Mood normal.        Behavior: Behavior normal.      UC Treatments /  Results  Labs (all labs ordered are listed, but only abnormal results are displayed) Labs Reviewed  SARS CORONAVIRUS 2 BY RT PCR    EKG   Radiology No results found.  Procedures Procedures (including critical care time)  Medications Ordered in UC Medications - No data to display  Initial Impression / Assessment and Plan / UC Course  I have reviewed the triage vital signs and the nursing notes.  Pertinent labs & imaging results that were available during my care of the patient were reviewed by me and considered in my medical decision making (see chart for details).  63 year old male presenting for 4-day history of flulike symptoms including chills, sweats, myalgias, fatigue, cough and congestion. No fever. Mild wheezing but no shortness of breath.  On exam patient appears well.  Exam is significant for mild nasal congestion and clear drainage. Chest is clear to auscultation.  Covid testing obtained.  Patient declines chest xray.   Suspect viral illness,  Advised increasing rest and fluid intake.  Sent promethazine  DM. Printed prescription for prednisone  in case wheezing worsens. May have mild asthma exacerbation.  Advised to follow-up with our department or ER if he develops any new or worsening symptoms including weakness, increased vomiting or diarrhea, abdominal pain, chest pain or shortness of breath.  Patient agreeable.   Final Clinical Impressions(s) / UC Diagnoses   Final diagnoses:  Acute cough  Viral  illness  Wheezing  Asthma, unspecified asthma severity, unspecified whether complicated, unspecified whether persistent     Discharge Instructions      -Negative COVID test  URI/COLD SYMPTOMS: Your exam today is consistent with a viral illness. Antibiotics are not indicated at this time. Use medications as directed, including cough syrup, nasal saline, and decongestants. Your symptoms should improve over the next few days and resolve within 7-10 days. Increase rest and fluids. F/u if symptoms worsen or predominate such as sore throat, ear pain, productive cough, shortness of breath, or if you develop high fevers or worsening fatigue over the next several days.    Use inhaler as needed for breathing trouble. Start prednisone  if wheezing or shortness of breath worsens.  Return if fever or increased breathing issues.     ED Prescriptions     Medication Sig Dispense Auth. Provider   promethazine -dextromethorphan  (PROMETHAZINE -DM) 6.25-15 MG/5ML syrup Take 5 mLs by mouth 4 (four) times daily as needed. 118 mL Nancy Axon B, PA-C   predniSONE  (DELTASONE ) 20 MG tablet Take 2 tablets (40 mg total) by mouth daily for 5 days. 10 tablet Lorel Lembo B, PA-C      PDMP not reviewed this encounter.      Floydene Hy, PA-C 02/22/24 1135

## 2024-02-22 NOTE — ED Triage Notes (Signed)
 Pt presents a cough, fatigue, and bodyaches x 4 days. Pt has taken OTC pain medication for his symptoms.

## 2024-02-22 NOTE — Discharge Instructions (Signed)
-  Negative COVID test  URI/COLD SYMPTOMS: Your exam today is consistent with a viral illness. Antibiotics are not indicated at this time. Use medications as directed, including cough syrup, nasal saline, and decongestants. Your symptoms should improve over the next few days and resolve within 7-10 days. Increase rest and fluids. F/u if symptoms worsen or predominate such as sore throat, ear pain, productive cough, shortness of breath, or if you develop high fevers or worsening fatigue over the next several days.    Use inhaler as needed for breathing trouble. Start prednisone  if wheezing or shortness of breath worsens.  Return if fever or increased breathing issues.

## 2024-02-24 NOTE — Telephone Encounter (Signed)
 Requested Prescriptions  Pending Prescriptions Disp Refills   albuterol  (VENTOLIN  HFA) 108 (90 Base) MCG/ACT inhaler [Pharmacy Med Name: ALBUTEROL  SULFATE HFA 108 (90 BASE)] 8.5 g 2    Sig: INHALE 2 PUFFS INTO THE LUNGA EVERY 4 HOURS AS NEEDED FOR WHEEZING OR SHORTNESS OF BREATH     Pulmonology:  Beta Agonists 2 Passed - 02/24/2024  1:51 PM      Passed - Last BP in normal range    BP Readings from Last 1 Encounters:  02/22/24 122/88         Passed - Last Heart Rate in normal range    Pulse Readings from Last 1 Encounters:  02/22/24 98         Passed - Valid encounter within last 12 months    Recent Outpatient Visits           3 weeks ago Local reaction to insect sting, accidental or unintentional, initial encounter   [place] [facility] [place], Rankin Buzzard, NP   3 months ago Primary hypertension   [place] [facility] [place], Kayleen Party, [place]

## 2024-04-20 ENCOUNTER — Other Ambulatory Visit: Payer: Self-pay | Admitting: Family Medicine

## 2024-04-20 DIAGNOSIS — K219 Gastro-esophageal reflux disease without esophagitis: Secondary | ICD-10-CM

## 2024-04-24 NOTE — Telephone Encounter (Signed)
 Requested Prescriptions  Refused Prescriptions Disp Refills   omeprazole  (PRILOSEC) 40 MG capsule [Pharmacy Med Name: OMEPRAZOLE  DR 40 MG CAP] 90 capsule 1    Sig: TAKE 1 CAPSULE BY MOUTH ONCE DAILY     Gastroenterology: Proton Pump Inhibitors Passed - 04/24/2024 12:42 PM      Passed - Valid encounter within last 12 months    Recent Outpatient Visits           2 months ago Local reaction to insect sting, accidental or unintentional, initial encounter   [facility] [place], Angeline ORN, NP   5 months ago Primary hypertension   [place] [facility] [place], Marsa PARAS, [place]

## 2024-06-07 ENCOUNTER — Other Ambulatory Visit: Payer: Self-pay | Admitting: Family Medicine

## 2024-06-07 DIAGNOSIS — J4530 Mild persistent asthma, uncomplicated: Secondary | ICD-10-CM

## 2024-06-08 NOTE — Telephone Encounter (Signed)
 Requested Prescriptions  Pending Prescriptions Disp Refills   albuterol  (VENTOLIN  HFA) 108 (90 Base) MCG/ACT inhaler [Pharmacy Med Name: ALBUTEROL  SULFATE HFA 108 (90 BASE)] 8.5 g 2    Sig: INHALE 2 PUFFS INTO THE LUNGA EVERY 4 HOURS AS NEEDED FOR WHEEZING OR SHORTNESS OF BREATH     Pulmonology:  Beta Agonists 2 Passed - 06/08/2024  5:21 PM      Passed - Last BP in normal range    BP Readings from Last 1 Encounters:  02/22/24 122/88         Passed - Last Heart Rate in normal range    Pulse Readings from Last 1 Encounters:  02/22/24 98         Passed - Valid encounter within last 12 months    Recent Outpatient Visits           4 months ago Local reaction to insect sting, accidental or unintentional, initial encounter   [place] [facility] [place], Angeline ORN, NP   7 months ago Primary hypertension   [place] [facility] [place], Marsa PARAS, [place]

## 2024-06-23 ENCOUNTER — Other Ambulatory Visit: Payer: Self-pay | Admitting: Family Medicine

## 2024-06-23 DIAGNOSIS — J4530 Mild persistent asthma, uncomplicated: Secondary | ICD-10-CM

## 2024-06-26 ENCOUNTER — Other Ambulatory Visit: Payer: Self-pay | Admitting: Family Medicine

## 2024-06-26 ENCOUNTER — Encounter: Payer: Self-pay | Admitting: Family Medicine

## 2024-06-26 ENCOUNTER — Ambulatory Visit: Admitting: Family Medicine

## 2024-06-26 VITALS — BP 138/78 | HR 83 | Ht 68.0 in | Wt 261.2 lb

## 2024-06-26 DIAGNOSIS — J4530 Mild persistent asthma, uncomplicated: Secondary | ICD-10-CM | POA: Diagnosis not present

## 2024-06-26 DIAGNOSIS — R110 Nausea: Secondary | ICD-10-CM

## 2024-06-26 DIAGNOSIS — R7303 Prediabetes: Secondary | ICD-10-CM

## 2024-06-26 DIAGNOSIS — E782 Mixed hyperlipidemia: Secondary | ICD-10-CM

## 2024-06-26 DIAGNOSIS — E781 Pure hyperglyceridemia: Secondary | ICD-10-CM

## 2024-06-26 DIAGNOSIS — I10 Essential (primary) hypertension: Secondary | ICD-10-CM

## 2024-06-26 DIAGNOSIS — R351 Nocturia: Secondary | ICD-10-CM

## 2024-06-26 DIAGNOSIS — Z125 Encounter for screening for malignant neoplasm of prostate: Secondary | ICD-10-CM

## 2024-06-26 MED ORDER — ONDANSETRON 4 MG PO TBDP: 4.0000 mg | ORAL_TABLET | Freq: Three times a day (TID) | ORAL | 0 refills | Status: AC | PRN

## 2024-06-26 MED ORDER — FLUTICASONE-SALMETEROL 500-50 MCG/ACT IN AEPB
1.0000 | INHALATION_SPRAY | Freq: Two times a day (BID) | RESPIRATORY_TRACT | 1 refills | Status: AC
Start: 2024-06-26 — End: ?

## 2024-06-26 MED ORDER — ALBUTEROL SULFATE HFA 108 (90 BASE) MCG/ACT IN AERS: 2.0000 | INHALATION_SPRAY | RESPIRATORY_TRACT | 3 refills | Status: AC | PRN

## 2024-06-26 NOTE — Telephone Encounter (Signed)
 Duplicate request, refilled 06/26/24.  Requested Prescriptions  Pending Prescriptions Disp Refills   fluticasone -salmeterol (ADVAIR ) 500-50 MCG/ACT AEPB [Pharmacy Med Name: FLUTICASONE -SALMETEROL 500-50 MCG/A] 180 each 1    Sig: INHALE 1 PUFF BY MOUTH TWICE DAILY. RINSE MOUTH AFTER USE.     Pulmonology:  Combination Products Passed - 06/26/2024  4:06 PM      Passed - Valid encounter within last 12 months    Recent Outpatient Visits           Today Primary hypertension   [place] [facility] [place], Marsa PARAS, DO   5 months ago Local reaction to insect sting, accidental or unintentional, initial encounter   [place] [facility] [place], Angeline ORN, NP   8 months ago Primary hypertension   [place] [facility] [place], Marsa PARAS, [place]

## 2024-06-26 NOTE — Patient Instructions (Addendum)
 Thank you for coming to the office today.  Refilled Advair  and Albuterol   Skin Tag Removal up to 15, I counted 12.  DUE for FASTING BLOOD WORK (no food or drink after midnight before the lab appointment, only water or coffee without cream/sugar on the morning of)  SCHEDULE Lab Only visit in the morning at the clinic for lab draw on 10/30  - Make sure Lab Only appointment is at about 1 week before your next appointment, so that results will be available  For Lab Results, once available within 2-3 days of blood draw, you can can log in to MyChart online to view your results and a brief explanation. Also, we can discuss results at next follow-up visit.   Please schedule a Follow-up Appointment to: Return for Bilateral Axillary Skin Tag Removal procedure .  If you have any other questions or concerns, please feel free to call the office or send a message through MyChart. You may also schedule an earlier appointment if necessary.  Additionally, you may be receiving a survey about your experience at our office within a few days to 1 week by e-mail or mail. We value your feedback.  Marsa Officer, DO [facility], [place]

## 2024-06-26 NOTE — Progress Notes (Signed)
 Subjective:    Patient ID: Derek Cook, male    DOB: [birth year], 63 y.o.   MRN: [id number]  Derek Cook is a 63 y.o. male presenting on 06/26/2024 for Medical Management of Chronic Issues, skin tags, and Nausea   HPI  Discussed the use of AI scribe software for clinical note transcription with the patient, who gave verbal consent to proceed.  History of Present Illness   Derek Cook is a 63 year old male who presents with abdominal rigidity and nausea.  Abdominal rigidity and nausea - Abdominal rigidity and severe nausea since last night - Sensation of impending vomiting, but only able to bring up a small amount - Symptoms persist unless remaining perfectly still - Onset associated with recent meals: thin steak with pasta salad for lunch and cornbread with kidney beans for dinner, both previously tolerated without issue  Cutaneous skin tags - Multiple large skin tags under the arms - Increase in number and size over time - Discomfort due to rubbing, especially during summer with increased sweating - Desires removal due to bothersome symptoms  Asthma management - Uses albuterol  and Advair  for asthma control - Currently running low on both medications, with only one albuterol  inhaler remaining         04/30/2023    1:18 PM 06/16/2022    9:55 AM 04/28/2022   10:43 AM  Depression screen PHQ 2/9  Decreased Interest 0 0 0  Down, Depressed, Hopeless 0 0 0  PHQ - 2 Score 0 0 0  Altered sleeping  0 0  Tired, decreased energy  0 0  Change in appetite  0 0  Feeling bad or failure about yourself   0 0  Trouble concentrating  0 0  Moving slowly or fidgety/restless  0 0  Suicidal thoughts  0 0  PHQ-9 Score  0 0  Difficult doing work/chores  Not difficult at all Not difficult at all       06/16/2022    9:55 AM 04/28/2022   10:43 AM 01/14/2021    1:19 PM  GAD 7 : Generalized Anxiety Score  Nervous, Anxious, on Edge 0 0 0  Control/stop worrying 0 0 0  Worry too much -  different things 0 0 0  Trouble relaxing 0 0 0  Restless 0 0 0  Easily annoyed or irritable 0 0 0  Afraid - awful might happen 0 0 0  Total GAD 7 Score 0 0 0  Anxiety Difficulty Not difficult at all Not difficult at all Not difficult at all    Social History   Tobacco Use   Smoking status: Former    Current packs/day: 0.00    Types: Cigarettes    Quit date: 09/07/2009    Years since quitting: 14.8   Smokeless tobacco: Former  Building services engineer status: Never Used  Substance Use Topics   Alcohol use: No   Drug use: No    Review of Systems Per HPI unless specifically indicated above     Objective:    BP 138/78 (BP Location: Left Arm, Cuff Size: Normal)   Pulse 83   Ht 5' 8 (1.727 m)   Wt 261 lb 4 oz (118.5 kg)   SpO2 97%   BMI 39.72 kg/m   Wt Readings from Last 3 Encounters:  06/26/24 261 lb 4 oz (118.5 kg)  01/28/24 262 lb (118.8 kg)  10/28/23 273 lb (123.8 kg)    Physical Exam Vitals and nursing  note reviewed.  Constitutional:      General: He is not in acute distress.    Appearance: Normal appearance. He is well-developed. He is not diaphoretic.     Comments: Well-appearing, comfortable, cooperative  HENT:     Head: Normocephalic and atraumatic.  Eyes:     General:        Right eye: No discharge.        Left eye: No discharge.     Conjunctiva/sclera: Conjunctivae normal.  Cardiovascular:     Rate and Rhythm: Normal rate.  Pulmonary:     Effort: Pulmonary effort is normal.  Skin:    General: Skin is warm and dry.     Findings: No erythema or rash.  Neurological:     Mental Status: He is alert and oriented to person, place, and time.  Psychiatric:        Mood and Affect: Mood normal.        Behavior: Behavior normal.        Thought Content: Thought content normal.     Comments: Well groomed, good eye contact, normal speech and thoughts     Results for orders placed or performed during the hospital encounter of 02/22/24  SARS Coronavirus 2 by RT  PCR (hospital order, performed in [facility] hospital lab) *cepheid single result test* Anterior Nasal Swab   Collection Time: 02/22/24 10:42 AM   Specimen: Anterior Nasal Swab  Result Value Ref Range   SARS Coronavirus 2 by RT PCR NEGATIVE NEGATIVE      Assessment & Plan:   Problem List Items Addressed This Visit     Hypertension - Primary   Morbid obesity (HCC)   Prediabetes   Other Visit Diagnoses       Mild persistent asthma without complication       Relevant Medications   albuterol  (VENTOLIN  HFA) 108 (90 Base) MCG/ACT inhaler   fluticasone -salmeterol (ADVAIR ) 500-50 MCG/ACT AEPB     Nausea       Relevant Medications   ondansetron  (ZOFRAN -ODT) 4 MG disintegrating tablet        Skin tags Multiple skin tags under arms causing discomfort, interested in removal. - Schedule removal for July 06, 2024. - Remove up to 15 tags with local anesthesia. - Advise on post-procedure care.  Nausea Acute nausea possibly related to diet, with morning symptoms. Possibly viral gastroenteritis vs other digestive trigger - Prescribe Zofran  as needed. - Provide work note for one day off.  Mild persistent asthma Uses albuterol  and Advair , out of Advair , one albuterol  inhaler left. - Refill Advair . - Refill albuterol .  General Health Maintenance Discussed routine blood work and A1c testing, aware of fasting requirement. - Schedule blood work including A1c on July 06, 2024, at 8:45 AM.        No orders of the defined types were placed in this encounter.   Meds ordered this encounter  Medications   albuterol  (VENTOLIN  HFA) 108 (90 Base) MCG/ACT inhaler    Sig: Inhale 2 puffs into the lungs every 4 (four) hours as needed for wheezing or shortness of breath.    Dispense:  8.5 g    Refill:  3   fluticasone -salmeterol (ADVAIR ) 500-50 MCG/ACT AEPB    Sig: Inhale 1 puff into the lungs in the morning and at bedtime.    Dispense:  180 each    Refill:  1   ondansetron   (ZOFRAN -ODT) 4 MG disintegrating tablet    Sig: Take 1 tablet (4 mg total) by  mouth every 8 (eight) hours as needed for nausea or vomiting.    Dispense:  20 tablet    Refill:  0    Follow up plan: Return for Bilateral Axillary Skin Tag Removal procedure .  Fasting labs 07/06/24   Marsa Officer, DO [facility] 06/26/2024, 10:50 AM

## 2024-07-05 ENCOUNTER — Telehealth: Payer: Self-pay

## 2024-07-05 NOTE — Telephone Encounter (Signed)
 Copied from CRM [id number]. Topic: General - Other >> Jul 05, 2024  3:32 PM Charlet HERO wrote: Reason for CRM: patient is calling to see what the code for skin tag removal, he is wanting to price it with his insurance. Please call the patient back with the information.

## 2024-07-06 ENCOUNTER — Other Ambulatory Visit: Payer: Self-pay

## 2024-07-06 ENCOUNTER — Ambulatory Visit: Payer: Self-pay | Admitting: Family Medicine

## 2024-07-24 ENCOUNTER — Other Ambulatory Visit: Payer: Self-pay | Admitting: Family Medicine

## 2024-07-24 DIAGNOSIS — K219 Gastro-esophageal reflux disease without esophagitis: Secondary | ICD-10-CM

## 2024-07-26 NOTE — Telephone Encounter (Signed)
 Requested Prescriptions  Pending Prescriptions Disp Refills   omeprazole  (PRILOSEC) 40 MG capsule [Pharmacy Med Name: OMEPRAZOLE  DR 40 MG CAP] 90 capsule 0    Sig: TAKE 1 CAPSULE BY MOUTH ONCE DAILY     Gastroenterology: Proton Pump Inhibitors Passed - 07/26/2024 11:41 AM      Passed - Valid encounter within last 12 months    Recent Outpatient Visits           1 month ago Primary hypertension   [place] [facility] [place], Marsa PARAS, DO   6 months ago Local reaction to insect sting, accidental or unintentional, initial encounter   [place] [facility] [place], Angeline ORN, NP   9 months ago Primary hypertension    [facility] [place], Marsa PARAS, [place]

## 2024-08-10 ENCOUNTER — Other Ambulatory Visit: Payer: Self-pay | Admitting: Family Medicine

## 2024-08-10 DIAGNOSIS — I10 Essential (primary) hypertension: Secondary | ICD-10-CM

## 2024-08-12 NOTE — Telephone Encounter (Signed)
 Requested Prescriptions  Pending Prescriptions Disp Refills   hydrochlorothiazide  (HYDRODIURIL ) 25 MG tablet [Pharmacy Med Name: HYDROCHLOROTHIAZIDE  25 MG TAB] 90 tablet 3    Sig: TAKE 1 TABLET BY MOUTH ONCE DAILY *STOP CHLORTHALIDONE      Cardiovascular: Diuretics - Thiazide Failed - 08/12/2024  9:13 PM      Failed - Cr in normal range and within 180 days    Creat  Date Value Ref Range Status  04/28/2022 1.17 0.70 - 1.30 mg/dL Final   Creatinine, Ser  Date Value Ref Range Status  07/03/2022 1.10 0.61 - 1.24 mg/dL Final         Failed - K in normal range and within 180 days    Potassium  Date Value Ref Range Status  07/03/2022 4.0 3.5 - 5.1 mmol/L Final  07/26/2014 4.1 3.5 - 5.1 mmol/L Final         Failed - Na in normal range and within 180 days    Sodium  Date Value Ref Range Status  07/03/2022 137 135 - 145 mmol/L Final  07/26/2014 138 136 - 145 mmol/L Final         Passed - Last BP in normal range    BP Readings from Last 1 Encounters:  06/26/24 138/78         Passed - Valid encounter within last 6 months    Recent Outpatient Visits           1 month ago Primary hypertension   [place] [facility] [place] Marsa PARAS, DO   6 months ago Local reaction to insect sting, accidental or unintentional, initial encounter   [place] [facility] [place], Angeline ORN, NP   9 months ago Primary hypertension   [place] [facility] [place], Marsa PARAS, [place]

## 2024-09-16 ENCOUNTER — Encounter: Payer: Self-pay | Admitting: Emergency Medicine

## 2024-09-16 ENCOUNTER — Other Ambulatory Visit: Payer: Self-pay | Admitting: Family Medicine

## 2024-09-16 ENCOUNTER — Ambulatory Visit
Admission: EM | Admit: 2024-09-16 | Discharge: 2024-09-16 | Disposition: A | Discharge status: Home / Self Care | Attending: Family Medicine | Admitting: Family Medicine

## 2024-09-16 DIAGNOSIS — J111 Influenza due to unidentified influenza virus with other respiratory manifestations: Secondary | ICD-10-CM

## 2024-09-16 DIAGNOSIS — J4521 Mild intermittent asthma with (acute) exacerbation: Secondary | ICD-10-CM

## 2024-09-16 DIAGNOSIS — J4530 Mild persistent asthma, uncomplicated: Secondary | ICD-10-CM

## 2024-09-16 DIAGNOSIS — R051 Acute cough: Secondary | ICD-10-CM | POA: Diagnosis not present

## 2024-09-16 DIAGNOSIS — R062 Wheezing: Secondary | ICD-10-CM

## 2024-09-16 MED ORDER — PREDNISONE 10 MG (21) PO TBPK: ORAL_TABLET | Freq: Every day | ORAL | 0 refills | Status: AC

## 2024-09-16 MED ORDER — PROMETHAZINE-DM 6.25-15 MG/5ML PO SYRP: 5.0000 mL | ORAL_SOLUTION | Freq: Four times a day (QID) | ORAL | 0 refills | Status: AC | PRN

## 2024-09-16 NOTE — ED Provider Notes (Signed)
 " [facility]   [id number] 09/16/24 Arrival Time: 1001  ASSESSMENT & PLAN:  1. Influenza-like illness   2. Acute cough   3. Wheezing   4. Mild intermittent asthma with acute exacerbation    Discussed typical duration of likely viral illness with asthma exac. No resp distress. OTC symptom care as needed.  Meds ordered this encounter  Medications   predniSONE  (STERAPRED UNI-PAK 21 TAB) 10 MG (21) TBPK tablet    Sig: Take by mouth daily. Take as directed.    Dispense:  21 tablet    Refill:  0   promethazine -dextromethorphan  (PROMETHAZINE -DM) 6.25-15 MG/5ML syrup    Sig: Take 5 mLs by mouth 4 (four) times daily as needed for cough.    Dispense:  118 mL    Refill:  0     Follow-up Information     [place] Marsa PARAS, DO.   Specialty: Family Medicine Why: As needed. Contact information: [address] [place] [address] [phone]         [facility] .   Specialty: Urgent Care Why: If worsening or failing to improve as anticipated. Contact information: [address] [place]  [address] [phone]                Reviewed expectations re: course of current medical issues. Questions answered. Outlined signs and symptoms indicating need for more acute intervention. Feels he is wheezing at night. Understanding verbalized. After Visit Summary given.   SUBJECTIVE: History from: Patient. Derek Cook is a 64 y.o. male. Pt c/o cough, nasal congestion, shortness of breath. Started about 3 days ago. Denies fever. He states his wife has bronchitis.  Denies: fever. Normal PO intake without n/v/d.  OBJECTIVE:  Vitals:   09/16/24 1045 09/16/24 1047  BP:  132/84  Pulse:  89  Resp:  18  Temp:  98.5 F (36.9 C)  TempSrc:  Oral  SpO2:  95%  Weight: 123.1 kg   Height: 5' 8 (1.727 m)     General appearance: alert; no distress Eyes: PERRLA; EOMI; conjunctiva normal HENT: [place]; AT; with nasal  congestion; throat cobblestoning Neck: supple  Lungs: speaks full sentences without difficulty; unlabored; with bilat exp wheezign Extremities: no edema Skin: warm and dry Neurologic: normal gait Psychological: alert and cooperative; normal mood and affect  Labs:  Labs Reviewed - No data to display  Imaging: No results found.  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)  Tobacco Use   Smoking status: Former    Current packs/day: 0.00    Types: Cigarettes    Quit date: 09/07/2009    Years since quitting: 15.0   Smokeless tobacco: Former  Building Services Engineer status: Never Used  Substance and Sexual Activity   Alcohol use: No   Drug use: No   Sexual activity: Not on file  Other Topics Concern   Not on file  Social History Narrative   Not  on file   Social Drivers of Health   Tobacco Use: Medium Risk (09/16/2024)   Patient History    Smoking Tobacco Use: Former    Smokeless Tobacco Use: Former    Passive Exposure: Not on Stage Manager: Not on file  Food Insecurity: Not on file  Transportation Needs: Not on file  Physical Activity: Not on file  Stress: Not on file  Social Connections: Unknown (01/20/2022)   Received from [facility]   Social Network    Social Network: Not on file  Intimate Partner Violence: Unknown (12/12/2021)   Received from [facility]   HITS    Physically Hurt: Not on file    Insult or Talk Down To: Not on file    Threaten Physical Harm: Not on file    Scream or Curse: Not on file  Depression  (PHQ2-9): Low Risk (04/30/2023)   Depression (PHQ2-9)    PHQ-2 Score: 0  Alcohol Screen: Not on file  Housing: Not on file  Utilities: Not on file  Health Literacy: Not on file   Family History  Problem Relation Age of Onset   Cancer Mother        melanoma   Diabetes Mother    Hyperlipidemia Mother    Dementia Mother    Cancer Father    Cancer Sister    Heart disease Sister    Diabetes Brother    Hyperlipidemia Brother    Dementia Paternal Grandmother    Past Surgical History:  Procedure Laterality Date   CLAVICLE EXCISION     HERNIA REPAIR Bilateral 2004, 2005   Inguinal repair with mesh   WRIST ARTHROPLASTY        [1]  Allergies Allergen Reactions   Statins Other (See Comments)    myalgias   Azithromycin  Hives   Shellfish Protein-Containing Drug Products    Sulfa Antibiotics      Rolinda Rogue, MD 09/16/24 1136  "

## 2024-09-16 NOTE — ED Triage Notes (Signed)
 Pt c/o cough, nasal congestion, shortness of breath. Started about 3 days ago. Denies fever. He states his wife has bronchitis.

## 2024-09-18 NOTE — Telephone Encounter (Signed)
 Too soon for refill.  Requested Prescriptions  Pending Prescriptions Disp Refills   fluticasone -salmeterol (ADVAIR ) 500-50 MCG/ACT AEPB [Pharmacy Med Name: FLUTICASONE -SALMETEROL 500-50 MCG/A] 180 each 1    Sig: INHALE 1 PUFF BY MOUTH TWICE DAILY. RINSE MOUTH AFTER USE.     Pulmonology:  Combination Products Passed - 09/18/2024  4:14 PM      Passed - Valid encounter within last 12 months    Recent Outpatient Visits           2 months ago Primary hypertension   [place] [facility] [place], Marsa PARAS, DO   7 months ago Local reaction to insect sting, accidental or unintentional, initial encounter   [place] [facility] [place], Angeline ORN, NP   10 months ago Primary hypertension   [place] [facility] [place], Marsa PARAS, [place]
